# Patient Record
Sex: Female | Born: 1950
Health system: Southern US, Community
[De-identification: ages and names within clinical notes are randomized; demographics above are authoritative.]

## PROBLEM LIST (undated history)

## (undated) DIAGNOSIS — E1169 Type 2 diabetes mellitus with other specified complication: Secondary | ICD-10-CM

## (undated) DIAGNOSIS — G500 Trigeminal neuralgia: Secondary | ICD-10-CM

## (undated) DIAGNOSIS — M1711 Unilateral primary osteoarthritis, right knee: Secondary | ICD-10-CM

## (undated) DIAGNOSIS — K219 Gastro-esophageal reflux disease without esophagitis: Secondary | ICD-10-CM

## (undated) DIAGNOSIS — I10 Essential (primary) hypertension: Secondary | ICD-10-CM

## (undated) DIAGNOSIS — E119 Type 2 diabetes mellitus without complications: Secondary | ICD-10-CM

## (undated) DIAGNOSIS — C50911 Malignant neoplasm of unspecified site of right female breast: Secondary | ICD-10-CM

## (undated) DIAGNOSIS — Z808 Family history of malignant neoplasm of other organs or systems: Secondary | ICD-10-CM

## (undated) DIAGNOSIS — M1990 Unspecified osteoarthritis, unspecified site: Secondary | ICD-10-CM

## (undated) DIAGNOSIS — E785 Hyperlipidemia, unspecified: Secondary | ICD-10-CM

## (undated) DIAGNOSIS — Z8041 Family history of malignant neoplasm of ovary: Secondary | ICD-10-CM

## (undated) DIAGNOSIS — Z923 Personal history of irradiation: Secondary | ICD-10-CM

## (undated) DIAGNOSIS — E039 Hypothyroidism, unspecified: Secondary | ICD-10-CM

## (undated) HISTORY — DX: Type 2 diabetes mellitus with other specified complication: E11.69

## (undated) HISTORY — DX: Malignant neoplasm of unspecified site of right female breast: C50.911

## (undated) HISTORY — DX: Family history of malignant neoplasm of other organs or systems: Z80.8

## (undated) HISTORY — DX: Family history of malignant neoplasm of ovary: Z80.41

## (undated) HISTORY — DX: Trigeminal neuralgia: G50.0

## (undated) HISTORY — PX: TUBAL LIGATION: SHX77

## (undated) HISTORY — DX: Type 2 diabetes mellitus without complications: E11.9

## (undated) HISTORY — DX: Unspecified osteoarthritis, unspecified site: M19.90

## (undated) HISTORY — DX: Hyperlipidemia, unspecified: E78.5

## (undated) HISTORY — PX: OTHER SURGICAL HISTORY: SHX169

## (undated) HISTORY — PX: COLONOSCOPY: SHX174

---
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
## 2005-02-01 ENCOUNTER — Other Ambulatory Visit: Admission: RE | Admit: 2005-02-01 | Discharge: 2005-02-01 | Payer: Self-pay | Admitting: Obstetrics and Gynecology

## 2005-02-09 ENCOUNTER — Ambulatory Visit (HOSPITAL_COMMUNITY): Admission: RE | Admit: 2005-02-09 | Discharge: 2005-02-09 | Payer: Self-pay | Admitting: Obstetrics and Gynecology

## 2005-03-02 ENCOUNTER — Encounter: Admission: RE | Admit: 2005-03-02 | Discharge: 2005-03-02 | Payer: Self-pay | Admitting: Internal Medicine

## 2007-08-18 ENCOUNTER — Encounter (INDEPENDENT_AMBULATORY_CARE_PROVIDER_SITE_OTHER): Payer: Self-pay | Admitting: Obstetrics and Gynecology

## 2007-08-18 ENCOUNTER — Ambulatory Visit (HOSPITAL_COMMUNITY): Admission: RE | Admit: 2007-08-18 | Discharge: 2007-08-18 | Payer: Self-pay | Admitting: Obstetrics and Gynecology

## 2008-05-28 ENCOUNTER — Ambulatory Visit (HOSPITAL_COMMUNITY): Admission: RE | Admit: 2008-05-28 | Discharge: 2008-05-28 | Payer: Self-pay | Admitting: Internal Medicine

## 2010-06-12 ENCOUNTER — Ambulatory Visit (HOSPITAL_COMMUNITY): Admission: RE | Admit: 2010-06-12 | Payer: Self-pay | Admitting: Internal Medicine

## 2010-07-19 ENCOUNTER — Encounter: Payer: Self-pay | Admitting: Internal Medicine

## 2010-11-10 NOTE — Op Note (Signed)
Christina Hernandez, Christina Hernandez NO.:  [id number]   MEDICAL RECORD NO.:  [id number]          PATIENT TYPE:  AMB   LOCATION:  [facility]                           FACILITY:  [facility]   PHYSICIAN:  Janine Limbo, M.D.DATE OF BIRTH:  [birth year]   DATE OF PROCEDURE:  08/18/2007  DATE OF DISCHARGE:                               OPERATIVE REPORT   PREOPERATIVE DIAGNOSIS:  1. Postmenopausal bleeding.  2. Fibroid uterus.  3. Endometrial polyps.   POSTOPERATIVE DIAGNOSIS:  1. Postmenopausal bleeding.  2. Fibroid uterus.  3. Endometrial polyps.  4. Right vaginal sidewall lesion.   PROCEDURE:  1. Hysteroscopy.  2. Hysteroscopic resection of polyps.  3. Dilatation and curettage.  4. Vaginal biopsy.   SURGEON:  Janine Limbo, M.D.   FIRST ASSISTANT:  None.   ANESTHESIA:  General.   DISPOSITION:  Christina Hernandez is a 60 year old female who presents with the  above mentioned diagnosis.  She understands the indications for her  surgical procedure and she accepts the risks of, but not limited to,  anesthetic complications, bleeding, infection, and possible damage to  surrounding organs.   FINDINGS:  The uterus was 10-12 weeks' size and irregular.  No adnexal  masses were appreciated.  On hysteroscopy, the patient was found to have  at least three endometrial polyps with the largest polyp measuring  approximately 3 cm in size.  On the right vaginal sidewall, there was a  0.5 cm polypoid lesion (consistent with a condylomata).  Several similar  lesions were present just inside the introitus of the vagina.  The area  was completely removed by vaginal biopsy.   OPERATIVE PROCEDURE:  The patient taken to the operating room where a  general anesthetic was given.  The patient's abdomen, perineum, and  vagina were prepped with multiple layers of Betadine.  The bladder was  drained of urine.  Examination under anesthesia was performed.  The  patient was sterilely draped.  A paracervical  block was placed using 10  mL of 0.5% Marcaine with epinephrine.  5 mL of 0.5% Marcaine with  epinephrine were injected directly into the lesion on the right vaginal  sidewall.  An endocervical curettage was performed.  The uterus was  sounded to 9 cm.  The cervix was gently dilated.  The operative  hysteroscope was inserted and the cavity was carefully inspected.  Pictures were taken.  We used a double loop instrument to resect the  polyps from the endometrial cavity.  There did appear to be a 1-2 cm  submucosal fibroid present and this was resected using the double loop  apparatus.  The cavity was then curetted using a medium sharp curette  until the cavity was felt to be clean.  Repeat hysteroscopy was  performed and no major lesions were left inside the endometrial cavity.  We then used a biopsy instrument to biopsy the lesion on the right  vaginal sidewall.  Hemostasis was achieved using silver nitrate.  A  repeat examination under anesthesia was performed and the uterus was  noted to be firm.  It was still somewhat irregular because fibroids did  remain.  The patient tolerated her procedure well.  The estimated blood  loss for the procedure was 5 mL.  The estimated fluid deficit was 750 mL  but there was noted to be 500 mL of hysteroscopic fluid on the operating  room floor.  The patient tolerated her procedure well.  She was awakened  from her anesthetic without difficulty and taken to the recovery room in  stable condition.  Sponge, needle, and instrument counts were correct on  two occasions.   FOLLOW UP INSTRUCTIONS:  The patient was given a prescription for Motrin  and she will take 800 mg every 8 hours as needed for mild to moderate  pain.  She will take Darvocet-N100 1 tablet every 4-6 hours as needed  for severe pain.  She will return to see Dr. Stefano Gaul in 2-3 weeks for  follow up examination.  She was given a copy of the postoperative  instruction sheet as prepared by the  [facility] for  patients who have undergone a dilatation and curettage.  The patient  will return to work on Tuesday, August 22, 2007.   DISPOSITION OF THE SPECIMENS:  The endocervical curettage, the  endometrial resections, the endometrial curettings, and the biopsy of  the right vaginal sidewall were sent to pathology for evaluation.      Janine Limbo, M.D.  Electronically Signed     AVS/MEDQ  D:  08/18/2007  T:  08/19/2007  Job:  [id number]

## 2010-11-10 NOTE — H&P (Signed)
Christina Hernandez, Christina Hernandez NO.:  [id number]   MEDICAL RECORD NO.:  [id number]          PATIENT TYPE:  AMB   LOCATION:  [facility]C                           [facility]ILITY:  [facility]   PHYSICIAN:  Janine Limbo, M.D.DATE OF BIRTH:  11-03-1950   DATE OF ADMISSION:  08/18/2007  DATE OF DISCHARGE:                              HISTORY & PHYSICAL   HISTORY OF PRESENT ILLNESS:  Ms. Haring is a 60 year old female, para 4-  0-0-4, who presents for hysteroscopy with resection of a fibroid.  She  will also have a dilatation and curettage.  The patient has been  followed at the [facility]. The patient complains of postmenopausal bleeding. She also  has a history of fibroids.  The patient had a hydrosonogram performed  which showed a 9.83 cm x 6.70 cm uterus.  The endometrium was noted to  be 1.24 cm thick. The ovaries appeared normal.  The patient was found to  have two masses within the uterus measuring 1.59 cm and 2.03 cm.  It is  uncertain whether these are fibroids or whether these are endometrial  polyps.  The patient had a postpartum tubal ligation in 1983.  An  endometrial biopsy was performed and it showed a benign endometrial  polyp and also benign endometrial cells.   OBSTETRICAL HISTORY:  The patient has had four vaginal deliveries at  term.   PAST MEDICAL HISTORY:  The patient has hypothyroidism and she currently  takes Synthroid.  She also has an elevated cholesterol value and she  takes Crestor.  She takes vitamin D as well.   DRUG ALLERGIES:  No known drug allergies.   SOCIAL HISTORY:  The patient denies cigarette use, alcohol use, and  recreational drug use.   REVIEW OF SYSTEMS:  The patient has headaches occasionally.   FAMILY HISTORY:  The patient has a family history of heart disease,  asthma, high blood pressure, and strokes.   PHYSICAL EXAM:  Weight is 170 pounds.  HEENT: Within normal limits.  CHEST:   Clear.  HEART:  Regular rate and rhythm.  BREASTS:  Without masses.  ABDOMEN:  Nontender.  EXTREMITIES:  Grossly normal and her neurologic exam is grossly normal.  PELVIC:  External genitalia is normal. The vagina is normal except for  pelvic relaxation.  Cervix is nontender. The uterus is 8-10 weeks size  and slightly irregular. Adnexa no masses and rectovaginal exam confirms.   ASSESSMENT:  1. Postmenopausal bleeding.  2. Fibroid uterus.  3. Endometrial mass is consistent with polyps or fibroids.  4. Hypothyroidism.  5. Hypercholesterolemia.Marland Kitchen   PLAN:  The  patient will undergo hysteroscopy with resection of  endometrial masses.  She will also have a dilatation and curettage. The  patient understands the indications for her surgical procedure and she  accepts the risks of, but not limited to, anesthetic complications,  bleeding, infection, and possible damage to the surrounding organs.      Janine Limbo, M.D.  Electronically Signed     AVS/MEDQ  D:  08/02/2007  T:  08/03/2007  Job:  [id number]

## 2010-11-16 ENCOUNTER — Other Ambulatory Visit (HOSPITAL_COMMUNITY): Payer: Self-pay | Admitting: Internal Medicine

## 2010-11-16 ENCOUNTER — Other Ambulatory Visit: Payer: Self-pay | Admitting: Internal Medicine

## 2010-11-16 DIAGNOSIS — R102 Pelvic and perineal pain: Secondary | ICD-10-CM

## 2010-11-16 DIAGNOSIS — R140 Abdominal distension (gaseous): Secondary | ICD-10-CM

## 2010-11-16 DIAGNOSIS — Z1231 Encounter for screening mammogram for malignant neoplasm of breast: Secondary | ICD-10-CM

## 2010-11-25 ENCOUNTER — Ambulatory Visit (HOSPITAL_COMMUNITY)
Admission: RE | Admit: 2010-11-25 | Discharge: 2010-11-25 | Disposition: A | Discharge status: Home / Self Care | Payer: BC Managed Care – PPO | Source: Ambulatory Visit | Attending: Internal Medicine | Admitting: Internal Medicine

## 2010-11-25 DIAGNOSIS — Z1231 Encounter for screening mammogram for malignant neoplasm of breast: Secondary | ICD-10-CM | POA: Insufficient documentation

## 2010-11-26 ENCOUNTER — Ambulatory Visit
Admission: RE | Admit: 2010-11-26 | Discharge: 2010-11-26 | Disposition: A | Discharge status: Home / Self Care | Payer: BC Managed Care – PPO | Source: Ambulatory Visit | Attending: Internal Medicine | Admitting: Internal Medicine

## 2010-11-26 DIAGNOSIS — R102 Pelvic and perineal pain: Secondary | ICD-10-CM

## 2010-11-26 DIAGNOSIS — R140 Abdominal distension (gaseous): Secondary | ICD-10-CM

## 2011-03-19 LAB — URINALYSIS, ROUTINE W REFLEX MICROSCOPIC
Bilirubin Urine: NEGATIVE
Glucose, UA: NEGATIVE
Hgb urine dipstick: NEGATIVE
Ketones, ur: NEGATIVE
Nitrite: NEGATIVE
Protein, ur: NEGATIVE
Specific Gravity, Urine: 1.01
Urobilinogen, UA: 0.2
pH: 7

## 2011-03-19 LAB — BASIC METABOLIC PANEL
BUN: 8
CO2: 31
Calcium: 9.4
Chloride: 104
Creatinine, Ser: 0.67
GFR calc Af Amer: >60
GFR calc non Af Amer: >60
Glucose, Bld: 104 — ABNORMAL HIGH
Potassium: 3.4 — ABNORMAL LOW
Sodium: 141

## 2011-03-19 LAB — CBC
HCT: 38.4
Hemoglobin: 13.3
MCHC: 34.5
MCV: 83.2
Platelets: 264
RBC: 4.61
RDW: 13
WBC: 6

## 2011-03-22 ENCOUNTER — Other Ambulatory Visit (HOSPITAL_COMMUNITY)
Admission: RE | Admit: 2011-03-22 | Discharge: 2011-03-22 | Disposition: A | Discharge status: Home / Self Care | Payer: BC Managed Care – PPO | Source: Ambulatory Visit | Attending: Obstetrics and Gynecology | Admitting: Obstetrics and Gynecology

## 2011-03-22 ENCOUNTER — Other Ambulatory Visit: Payer: Self-pay | Admitting: Obstetrics and Gynecology

## 2011-03-22 DIAGNOSIS — Z01419 Encounter for gynecological examination (general) (routine) without abnormal findings: Secondary | ICD-10-CM | POA: Insufficient documentation

## 2011-04-02 ENCOUNTER — Other Ambulatory Visit: Payer: Self-pay | Admitting: Obstetrics and Gynecology

## 2011-05-17 ENCOUNTER — Encounter (HOSPITAL_COMMUNITY): Payer: Self-pay | Admitting: Pharmacy Technician

## 2011-05-18 ENCOUNTER — Encounter (HOSPITAL_COMMUNITY): Payer: Self-pay

## 2011-05-18 ENCOUNTER — Encounter (HOSPITAL_COMMUNITY)
Admission: RE | Admit: 2011-05-18 | Discharge: 2011-05-18 | Disposition: A | Discharge status: Home / Self Care | Payer: BC Managed Care – PPO | Source: Ambulatory Visit | Attending: Obstetrics and Gynecology | Admitting: Obstetrics and Gynecology

## 2011-05-18 HISTORY — DX: Essential (primary) hypertension: I10

## 2011-05-18 HISTORY — DX: Gastro-esophageal reflux disease without esophagitis: K21.9

## 2011-05-18 HISTORY — DX: Hypothyroidism, unspecified: E03.9

## 2011-05-18 LAB — BASIC METABOLIC PANEL
BUN: 14 mg/dL (ref 6–23)
CO2: 26 mEq/L (ref 19–32)
Calcium: 9.7 mg/dL (ref 8.4–10.5)
Chloride: 98 mEq/L (ref 96–112)
Creatinine, Ser: 0.65 mg/dL (ref 0.50–1.10)
GFR calc Af Amer: >90 mL/min (ref >90)
GFR calc non Af Amer: >90 mL/min (ref >90)
Glucose, Bld: 102 mg/dL — ABNORMAL HIGH (ref 70–99)
Potassium: 3.2 mEq/L — ABNORMAL LOW (ref 3.5–5.1)
Sodium: 136 mEq/L (ref 135–145)

## 2011-05-18 LAB — CBC
HCT: 38.8 % (ref 36.0–46.0)
Hemoglobin: 12.6 g/dL (ref 12.0–15.0)
MCH: 26.8 pg (ref 26.0–34.0)
MCHC: 32.5 g/dL (ref 30.0–36.0)
MCV: 82.4 fL (ref 78.0–100.0)
Platelets: 267 10*3/uL (ref 150–400)
RBC: 4.71 MIL/uL (ref 3.87–5.11)
RDW: 14.2 % (ref 11.5–15.5)
WBC: 5.4 10*3/uL (ref 4.0–10.5)

## 2011-05-18 NOTE — Patient Instructions (Signed)
YOUR PROCEDURE IS SCHEDULED ON:05/24/11  ENTER THROUGH THE MAIN ENTRANCE OF [facility] AT:6am  USE DESK PHONE AND DIAL [phone] TO INFORM us OF YOUR ARRIVAL  CALL [phone] IF YOU HAVE ANY QUESTIONS OR PROBLEMS PRIOR TO YOUR ARRIVAL.  REMEMBER: DO NOT EAT OR DRINK AFTER MIDNIGHT :Sunday  SPECIAL INSTRUCTIONS:   YOU MAY BRUSH YOUR TEETH THE MORNING OF SURGERY   TAKE THESE MEDICINES THE DAY OF SURGERY WITH SIP OF WATER:Thyroid and BP pills   DO NOT WEAR JEWELRY, EYE MAKEUP, LIPSTICK OR DARK FINGERNAIL POLISH DO NOT WEAR LOTIONS OR DEODORANT DO NOT SHAVE FOR 48 HOURS PRIOR TO SURGERY  YOU WILL NOT BE ALLOWED TO DRIVE YOURSELF HOME.  NAME OF DRIVER: Roe Coombs - spouse- [phone]

## 2011-05-18 NOTE — Pre-Procedure Instructions (Signed)
Patient's K+ is low but not low enough to alert [facility]. I called pt at home and advised her to eat bananas daily until surgery so K+ wouldn't drop too low. Patient states she does eat bananas but usually not everyday, and will follow my advise to eat more bananas,etc.

## 2011-05-18 NOTE — Pre-Procedure Instructions (Signed)
Pt is scheduled for stress test on 05/19/11 as a work up due to family history of cardiac problems.

## 2011-05-23 ENCOUNTER — Other Ambulatory Visit: Payer: Self-pay | Admitting: Obstetrics and Gynecology

## 2011-05-23 NOTE — H&P (Signed)
  History of Present Illness  General:  60 y/o presents for pre-op for Hysteroscopy/D&C due to thickened endometrial stripe. Pt went for preop clearance with her PCP and had c/o SOB on ROS. Cardiac work up completed and normal. Pt was initially referred by PCP to evaluate a large fibroid uterus.  Pt with complaint of bloating.  Denied abnormal bleeding.  Some pelvic pain but nothing severe.  Thickened EMS noted on ultrasound.  EMB did not show endometrial tissue.   Current Medications  Meloxicam Tablet 1 tablet prn  Synthroid 75 MCG Tablet 1 tablet every morning on an empty stomach Once a day  Flexeril Tablet 1 tablet prn  Medication List reviewed and reconciled with the patient   Past Medical History  Hypothyroidism  Back spasm   Surgical History  polyps removed x 2    Family History  Father: deceased hypertension, kidney failure   Mother: deceased hypertension   Sister 1: deceased cancer, ovarian    Social History  General:  History of smoking  cigarettes: Never smoked no Smoking.  no Alcohol.  no Recreational drug use.  Exercise: nothing structured.  Occupation: Retired Runner, broadcasting/film/video.  Children: Boys, 3, girls, 1.    Gyn History  Periods : postmenopausal.  Denies H/O LMP .  Birth control BTL.  Last pap smear date 03/22/11.  Last mammogram date 11/25/10.  Denies H/O Abnormal pap smear .  Denies H/O STD .  Menarche 10.  GYN procedures Endometrial Biopsy 04/02/11.    OB History  Number of pregnancies 4.  Pregnancy # 1 live birth, vaginal delivery.  Pregnancy # 2 live birth, vaginal delivery.  Pregnancy # 3 live birth, vaginal delivery.  Pregnancy # 4: live birth, vaginal delivery.    Allergies  N.K.D.A.   Hospitalization/Major Diagnostic Procedure  chlidbirth x 4    Review of Systems  See HPI.   Vital Signs  Wt 179, Wt change 1 lb, Ht 65, BMI 29.78, Pulse sitting 107, BP sitting 118/74 179.   Physical Examination  GENERAL:  Patient appears alert and  oriented.  General Appearance: well-appearing, well-developed, no acute distress.  Speech: clear.  NECK:  Thyroid: no thyromegaly.  LUNGS:  General clear bilaterally, no crackles, no wheezes.  HEART:  Heart sounds: normal, RRR, no murmur.  ABDOMEN:  General: no masses tenderness or organomegaly, keloid at umbilicus.  FEMALE GENITOURINARY:  Cervix visualized, healthy appearing, no discharge, no lesions.  Adnexa: no mass, non tender.  Uterus: normal size/shape/consistency, freely mobile, non tender, RETROVERTED.  Vagina: pink/moist mucosa, no lesions, no abnormal discharge.  Vulva: normal, no lesions, no skin discoloration.  Anus: no external hemosrhoids.  EXTREMITIES:  general no edema.     Assessments   1. Pre-op exam - V72.84 (Primary)   2. Thickened endometrium - 793.5   Treatment  1. Pre-op exam  Discussed R/B/A of surgery and indication. All questions answered.    Follow Up  2 Weeks (Reason: Post op)

## 2011-05-24 ENCOUNTER — Encounter (HOSPITAL_COMMUNITY): Payer: Self-pay | Admitting: Anesthesiology

## 2011-05-24 ENCOUNTER — Encounter (HOSPITAL_COMMUNITY)
Admission: RE | Disposition: A | Discharge status: Home / Self Care | Payer: Self-pay | Source: Ambulatory Visit | Attending: Obstetrics and Gynecology

## 2011-05-24 ENCOUNTER — Ambulatory Visit (HOSPITAL_COMMUNITY)
Admission: RE | Admit: 2011-05-24 | Discharge: 2011-05-24 | Disposition: A | Discharge status: Home / Self Care | Payer: BC Managed Care – PPO | Source: Ambulatory Visit | Attending: Obstetrics and Gynecology | Admitting: Obstetrics and Gynecology

## 2011-05-24 ENCOUNTER — Ambulatory Visit (HOSPITAL_COMMUNITY): Payer: BC Managed Care – PPO | Admitting: Anesthesiology

## 2011-05-24 ENCOUNTER — Other Ambulatory Visit: Payer: Self-pay | Admitting: Obstetrics and Gynecology

## 2011-05-24 DIAGNOSIS — R9389 Abnormal findings on diagnostic imaging of other specified body structures: Secondary | ICD-10-CM | POA: Insufficient documentation

## 2011-05-24 HISTORY — PX: HYSTEROSCOPY WITH D & C: SHX1775

## 2011-05-24 LAB — TYPE AND SCREEN
ABO/RH(D): A POS
Antibody Screen: NEGATIVE

## 2011-05-24 LAB — ABO/RH: ABO/RH(D): A POS

## 2011-05-24 SURGERY — DILATATION AND CURETTAGE /HYSTEROSCOPY
Anesthesia: Monitor Anesthesia Care | Site: Vagina | Wound class: Clean Contaminated

## 2011-05-24 MED ORDER — KETOROLAC TROMETHAMINE 30 MG/ML IJ SOLN
INTRAMUSCULAR | Status: DC | PRN
  Administered 2011-05-24: 30 mg via INTRAVENOUS

## 2011-05-24 MED ORDER — DEXAMETHASONE SODIUM PHOSPHATE 10 MG/ML IJ SOLN
INTRAMUSCULAR | Status: AC
  Filled 2011-05-24: qty 1

## 2011-05-24 MED ORDER — PROPOFOL 10 MG/ML IV EMUL
INTRAVENOUS | Status: AC
  Filled 2011-05-24: qty 20

## 2011-05-24 MED ORDER — FENTANYL CITRATE 0.05 MG/ML IJ SOLN
INTRAMUSCULAR | Status: DC | PRN
  Administered 2011-05-24: 25 ug via INTRAVENOUS

## 2011-05-24 MED ORDER — LACTATED RINGERS IV SOLN
INTRAVENOUS | Status: DC
  Administered 2011-05-24 (×2): via INTRAVENOUS

## 2011-05-24 MED ORDER — ONDANSETRON HCL 4 MG/2ML IJ SOLN
INTRAMUSCULAR | Status: DC | PRN
  Administered 2011-05-24: 4 mg via INTRAVENOUS

## 2011-05-24 MED ORDER — PROPOFOL 10 MG/ML IV EMUL
INTRAVENOUS | Status: DC | PRN
  Administered 2011-05-24: 20 mg via INTRAVENOUS
  Administered 2011-05-24 (×3): 10 mg via INTRAVENOUS
  Administered 2011-05-24: 20 mg via INTRAVENOUS
  Administered 2011-05-24: 10 mg via INTRAVENOUS
  Administered 2011-05-24: 50 mg via INTRAVENOUS
  Administered 2011-05-24: 20 mg via INTRAVENOUS

## 2011-05-24 MED ORDER — FENTANYL CITRATE 0.05 MG/ML IJ SOLN: 25.0000 ug | INTRAMUSCULAR | Status: DC | PRN

## 2011-05-24 MED ORDER — CEFOTETAN DISODIUM 1 G IJ SOLR
1.0000 g | INTRAMUSCULAR | Status: AC
  Administered 2011-05-24: 1 g via INTRAVENOUS
  Filled 2011-05-24: qty 1

## 2011-05-24 MED ORDER — LIDOCAINE HCL (CARDIAC) 20 MG/ML IV SOLN
INTRAVENOUS | Status: AC
  Filled 2011-05-24: qty 5

## 2011-05-24 MED ORDER — MIDAZOLAM HCL 5 MG/5ML IJ SOLN
INTRAMUSCULAR | Status: DC | PRN
  Administered 2011-05-24: 2 mg via INTRAVENOUS

## 2011-05-24 MED ORDER — LIDOCAINE HCL 2 % IJ SOLN
INTRAMUSCULAR | Status: DC | PRN
  Administered 2011-05-24: 5 mL

## 2011-05-24 MED ORDER — DEXAMETHASONE SODIUM PHOSPHATE 10 MG/ML IJ SOLN
INTRAMUSCULAR | Status: DC | PRN
  Administered 2011-05-24: 10 mg via INTRAVENOUS

## 2011-05-24 MED ORDER — GLYCINE 1.5 % IR SOLN
Status: DC | PRN
  Administered 2011-05-24: 3000 mL

## 2011-05-24 MED ORDER — MIDAZOLAM HCL 2 MG/2ML IJ SOLN
INTRAMUSCULAR | Status: AC
  Filled 2011-05-24: qty 2

## 2011-05-24 MED ORDER — KETOROLAC TROMETHAMINE 30 MG/ML IJ SOLN
INTRAMUSCULAR | Status: AC
  Filled 2011-05-24: qty 1

## 2011-05-24 MED ORDER — FENTANYL CITRATE 0.05 MG/ML IJ SOLN
INTRAMUSCULAR | Status: AC
  Filled 2011-05-24: qty 2

## 2011-05-24 MED ORDER — ONDANSETRON HCL 4 MG/2ML IJ SOLN
INTRAMUSCULAR | Status: AC
  Filled 2011-05-24: qty 2

## 2011-05-24 MED ORDER — IBUPROFEN 600 MG PO TABS
ORAL_TABLET | ORAL | Status: DC
Start: 2011-05-24 — End: 2014-01-01

## 2011-05-24 MED ORDER — LIDOCAINE HCL (CARDIAC) 20 MG/ML IV SOLN
INTRAVENOUS | Status: DC | PRN
  Administered 2011-05-24: 10 mg via INTRAVENOUS

## 2011-05-24 SURGICAL SUPPLY — 21 items
CANISTER SUCTION 2500CC (MISCELLANEOUS) ×3 IMPLANT
CATH ROBINSON RED A/P 16FR (CATHETERS) ×6 IMPLANT
CLOTH BEACON ORANGE TIMEOUT ST (SAFETY) ×6 IMPLANT
CONTAINER PREFILL 10% NBF 60ML (FORM) ×6 IMPLANT
DECANTER SPIKE VIAL GLASS SM (MISCELLANEOUS) ×3 IMPLANT
DILATOR CANAL MILEX (MISCELLANEOUS) IMPLANT
DRAPE UTILITY XL STRL (DRAPES) ×3 IMPLANT
GLOVE BIO SURGEON STRL SZ 6.5 (GLOVE) ×6 IMPLANT
GLOVE BIO SURGEON STRL SZ7 (GLOVE) ×6 IMPLANT
GLOVE BIOGEL PI IND STRL 7.0 (GLOVE) ×4 IMPLANT
GLOVE BIOGEL PI INDICATOR 7.0 (GLOVE) ×2
GOWN PREVENTION PLUS LG XLONG (DISPOSABLE) ×12 IMPLANT
NDL SPNL 22GX3.5 QUINCKE BK (NEEDLE) ×1 IMPLANT
NEEDLE SPNL 22GX3.5 QUINCKE BK (NEEDLE) ×3 IMPLANT
NS IRRIG 1000ML POUR BTL (IV SOLUTION) ×3 IMPLANT
PACK HYSTEROSCOPY LF (CUSTOM PROCEDURE TRAY) ×3 IMPLANT
PACK VAGINAL MINOR WOMEN LF (CUSTOM PROCEDURE TRAY) ×3 IMPLANT
PAD PREP 24X48 CUFFED NSTRL (MISCELLANEOUS) ×3 IMPLANT
SYR CONTROL 10ML LL (SYRINGE) ×3 IMPLANT
TOWEL OR 17X24 6PK STRL BLUE (TOWEL DISPOSABLE) ×12 IMPLANT
WATER STERILE IRR 1000ML POUR (IV SOLUTION) ×3 IMPLANT

## 2011-05-24 NOTE — Transfer of Care (Signed)
Immediate Anesthesia Transfer of Care Note  Patient: Christina Hernandez  Procedure(s) Performed:  DILATATION AND CURETTAGE (D&C) /HYSTEROSCOPY  Patient Location: PACU  Anesthesia Type: MAC  Level of Consciousness: awake,alert, cooperative.  Airway & Oxygen Therapy: Patient Spontanous Breathing and Patient connected to nasal cannula oxygen  Post-op Assessment: Report given to PACU RN and Post -op Vital signs reviewed and stable  Post vital signs: Reviewed  Complications: No apparent anesthesia complications

## 2011-05-24 NOTE — H&P (Signed)
Update h&p. Pt without complaints.  Pt examined and in good health.

## 2011-05-24 NOTE — Anesthesia Preprocedure Evaluation (Addendum)
Anesthesia Evaluation  Patient identified by MRN, date of birth, ID band Patient awake    Reviewed: Allergy & Precautions, H&P , Patient's Chart, lab work & pertinent test results, reviewed documented beta blocker date and time   Airway Mallampati: II TM Distance: >3 FB Neck ROM: full    Dental No notable dental hx.    Pulmonary  clear to auscultation  Pulmonary exam normal       Cardiovascular hypertension (well controlled), On Medications regular Normal    Neuro/Psych    GI/Hepatic GERD- (Rare sx)  Controlled,  Endo/Other    Renal/GU      Musculoskeletal   Abdominal   Peds  Hematology   Anesthesia Other Findings   Reproductive/Obstetrics                          Anesthesia Physical Anesthesia Plan  ASA: II  Anesthesia Plan: MAC and General   Post-op Pain Management:    Induction: Intravenous  Airway Management Planned: Mask  Additional Equipment:   Intra-op Plan:   Post-operative Plan:   Informed Consent: I have reviewed the patients History and Physical, chart, labs and discussed the procedure including the risks, benefits and alternatives for the proposed anesthesia with the patient or authorized representative who has indicated his/her understanding and acceptance.   Dental Advisory Given  Plan Discussed with: CRNA and Surgeon  Anesthesia Plan Comments: (  Discussed  general anesthesia, including possible nausea, instrumentation of airway, sore throat,pulmonary aspiration, etc. I asked if the were any outstanding questions, or  concerns before we proceeded. )        Anesthesia Quick Evaluation

## 2011-05-24 NOTE — Op Note (Signed)
NAMEJAYSHA, Christina Hernandez NO.:  [id number]  MEDICAL RECORD NO.:  [id number]  LOCATION:  [facility]                          FACILITY[facility]  [facility]  PHYSICIAN:  Pieter Partridge, MD   DATE OF BIRTH:  1951/03/09  DATE OF PROCEDURE:  05/24/2011 DATE OF DISCHARGE:                              OPERATIVE REPORT   PREOPERATIVE DIAGNOSIS:  Thickened endometrium.  POSTOPERATIVE DIAGNOSIS:  Thickened endometrium.  PROCEDURE:  Hysteroscopy, dilation and curettage.  SURGEON:  Pieter Partridge, MD  ASSISTANT:  None.  ANESTHESIA:  Local 2% lidocaine, 8 mL and IV sedation.  ESTIMATED BLOOD LOSS:  Minimal.  URINE OUTPUT:  In and out cath prior to procedure was 50 mL.  BLOOD ADMINISTERED:  None.  DRAINS:  None.  SPECIMEN:  Endometrial curettings to pathology.  DISPOSITION:  To PACU, hemodynamically stable.  FINDINGS:  Stenotic internal os.  Narrow cavity with thickened normal endometrial tissue.  No discrete masses visualized.  Large fibroid uterus.  Uterus down to 6-7 cm.  INDICATIONS:  Ms. Minella is a 60 year old female who was referred to me for an enlarged fibroid uterus by her primary care doctor.  The patient had been complaining of bloating and some pelvic pain.  On ultrasound, the endometrial stripe appeared to be thickened.  In the office, endometrial biopsy was performed, however, only the endocervix was sampled due to stenosis.  The patient was counseled on the need for further evaluation of the thickened endometrium and consented for hysteroscopy, D and C.  PROCEDURE IN DETAIL:  The patient was taken to the operating room.  She underwent MAC sedation without complication.  She was placed in the dorsal lithotomy position, and prepped and draped in a normal sterile fashion.  A Graves speculum was advanced into the uterus.  The anterior lip of the cervix was grasped with a single-tooth tenaculum after about 2 mL of 2% lidocaine were injected at the anterior cervical  lip.  The paracervical block was performed.  The cervical os was then entered and the os was somewhat stenotic and with dilation that was opened.  Once the cervix was dilated, I advanced the hysteroscope and noted a narrow cavity that actually kind of displaced to the right.  Initially, I thought it might have been a false passage, but it appeared that there was normal endometrial tissue and the uterine fundus was appreciated.  I did not see any discrete polyps, but I did see a large piece of endometrium at the midline.  The hysteroscope was removed.  A sharp curettage was done.  Again, the cervical opening deviated to the patient's left as did the endometrial cavity, but I felt strongly that was inside the endometrial cavity.  A sharp curettage was performed.  The endometrial tissue did not appear copious but I did looked back in with a hysteroscope and could tell that I had gotten some of the endometrial tissue and the large piece of endometrium had also been removed.  All instruments were removed from the vagina  after I looked with the hysteroscope one more time just to make sure there was no perforation. There was no bleeding at the site of the tenaculum site or from  the endocervix.  The patient tolerated the procedure well.  All sponge and instrument counts were correct x3.  The patient got antibiotics prior to surgery and she had SCDs on in place.     Pieter Partridge, MD     EBV/MEDQ  D:  05/24/2011  T:  05/24/2011  Job:  [id number]

## 2011-05-24 NOTE — Preoperative (Signed)
Beta Blockers   Reason not to administer Beta Blockers:Not Applicable 

## 2011-05-24 NOTE — Anesthesia Postprocedure Evaluation (Signed)
Anesthesia Post Note  Patient: Christina Hernandez  Procedure(s) Performed:  DILATATION AND CURETTAGE (D&C) /HYSTEROSCOPY  Anesthesia type: MAC  Patient location: PACU  Post pain: Pain level controlled  Post assessment: Post-op Vital signs reviewed  Last Vitals:  Filed Vitals:   05/24/11 0845  BP: 121/72  Pulse: 79  Temp: 36.3 C  Resp: 16    Post vital signs: Reviewed  Level of consciousness: sedated  Complications: No apparent anesthesia complications

## 2011-05-24 NOTE — Brief Op Note (Signed)
05/24/2011  8:15 AM  PATIENT:  Christina Hernandez  60 y.o. female  PRE-OPERATIVE DIAGNOSIS:  thickened endometrium  POST-OPERATIVE DIAGNOSIS:  thickeend endometrium  PROCEDURE:  Procedure(s): DILATATION AND CURETTAGE (D&C) /HYSTEROSCOPY  SURGEON:  Surgeon(s): Geryl Rankins, MD  PHYSICIAN ASSISTANT: None  ASSISTANTS: None   ANESTHESIA:   local and IV sedation  EBL:  Total I/O In: 1000 [I.V.:1000] Out: -   BLOOD ADMINISTERED:none  DRAINS: none   LOCAL MEDICATIONS USED:  2% LIDOCAINE 8 CC  SPECIMEN:  Source of Specimen:  Uterus-endometrial currettings  DISPOSITION OF SPECIMEN:  PATHOLOGY  COUNTS:  YES  TOURNIQUET:  * No tourniquets in log *  DICTATION: .Other Dictation: Dictation Number [id number]  PLAN OF CARE: Discharge to home after PACU  PATIENT DISPOSITION:  PACU - hemodynamically stable.   Delay start of Pharmacological VTE agent (>24hrs) due to surgical blood loss or risk of bleeding:  yes

## 2011-05-25 ENCOUNTER — Encounter (HOSPITAL_COMMUNITY): Payer: Self-pay | Admitting: Obstetrics and Gynecology

## 2011-12-21 ENCOUNTER — Other Ambulatory Visit (HOSPITAL_COMMUNITY): Payer: Self-pay | Admitting: Family Medicine

## 2011-12-21 DIAGNOSIS — Z1231 Encounter for screening mammogram for malignant neoplasm of breast: Secondary | ICD-10-CM

## 2012-01-14 ENCOUNTER — Ambulatory Visit (HOSPITAL_COMMUNITY)
Admission: RE | Admit: 2012-01-14 | Discharge: 2012-01-14 | Disposition: A | Discharge status: Home / Self Care | Payer: BC Managed Care – PPO | Source: Ambulatory Visit | Attending: Family Medicine | Admitting: Family Medicine

## 2012-01-14 DIAGNOSIS — Z1231 Encounter for screening mammogram for malignant neoplasm of breast: Secondary | ICD-10-CM | POA: Insufficient documentation

## 2012-02-17 ENCOUNTER — Ambulatory Visit
Admission: RE | Admit: 2012-02-17 | Discharge: 2012-02-17 | Disposition: A | Discharge status: Home / Self Care | Payer: BC Managed Care – PPO | Source: Ambulatory Visit | Attending: Internal Medicine | Admitting: Internal Medicine

## 2012-02-17 ENCOUNTER — Other Ambulatory Visit: Payer: Self-pay | Admitting: Internal Medicine

## 2012-02-17 DIAGNOSIS — R05 Cough: Secondary | ICD-10-CM

## 2012-02-17 DIAGNOSIS — R059 Cough, unspecified: Secondary | ICD-10-CM

## 2012-02-17 DIAGNOSIS — R0602 Shortness of breath: Secondary | ICD-10-CM

## 2012-06-28 HISTORY — PX: WISDOM TOOTH EXTRACTION: SHX21

## 2013-01-29 ENCOUNTER — Other Ambulatory Visit (HOSPITAL_COMMUNITY): Payer: Self-pay | Admitting: Internal Medicine

## 2013-01-29 DIAGNOSIS — Z1231 Encounter for screening mammogram for malignant neoplasm of breast: Secondary | ICD-10-CM

## 2013-02-02 ENCOUNTER — Ambulatory Visit (HOSPITAL_COMMUNITY)
Admission: RE | Admit: 2013-02-02 | Discharge: 2013-02-02 | Disposition: A | Discharge status: Home / Self Care | Payer: BC Managed Care – PPO | Source: Ambulatory Visit | Attending: Internal Medicine | Admitting: Internal Medicine

## 2013-02-02 DIAGNOSIS — Z1231 Encounter for screening mammogram for malignant neoplasm of breast: Secondary | ICD-10-CM

## 2013-02-16 ENCOUNTER — Ambulatory Visit (INDEPENDENT_AMBULATORY_CARE_PROVIDER_SITE_OTHER): Payer: BC Managed Care – PPO | Admitting: Internal Medicine

## 2013-02-16 VITALS — BP 128/78 | HR 108 | Temp 98.9°F | Resp 18

## 2013-02-16 DIAGNOSIS — J209 Acute bronchitis, unspecified: Secondary | ICD-10-CM

## 2013-02-16 MED ORDER — AZITHROMYCIN 500 MG PO TABS: 500.0000 mg | ORAL_TABLET | Freq: Every day | ORAL | Status: DC

## 2013-02-16 MED ORDER — HYDROCODONE-ACETAMINOPHEN 7.5-325 MG/15ML PO SOLN: 5.0000 mL | Freq: Four times a day (QID) | ORAL | Status: DC | PRN

## 2013-02-16 NOTE — Progress Notes (Signed)
  Subjective:    Patient ID: Christina Hernandez, female    DOB: 04-Mar-1951, 62 y.o.   MRN: [id number]  HPI    Review of Systems     Objective:   Physical Exam        Assessment & Plan:

## 2013-02-16 NOTE — Progress Notes (Signed)
  Subjective:    Patient ID: Christina Hernandez, female    DOB: 18-Jul-1950, 62 y.o.   MRN: [id number]  HPI 62 year old female here for cough, congestion and sore throat.  Mild headache.  Started coughing one week ago.  Was on a cruise and when she returned this past Wednesday the other symptoms started in.  No fever or chills.  No difficulty swallowing or short of breath.  Not coughing up any phlem.  Minimal fever, no cp.   Review of Systems Dr. Lelon Perla her IM    Objective:   Physical Exam  Vitals reviewed. Constitutional: She is oriented to person, place, and time. She appears well-developed and well-nourished. No distress.  HENT:  Right Ear: External ear normal.  Left Ear: External ear normal.  Nose: Mucosal edema, rhinorrhea and sinus tenderness present. Right sinus exhibits no maxillary sinus tenderness and no frontal sinus tenderness. Left sinus exhibits maxillary sinus tenderness and frontal sinus tenderness.  Mouth/Throat: Oropharynx is clear and moist.  Eyes: EOM are normal. Pupils are equal, round, and reactive to light.  Neck: Normal range of motion. Neck supple.  Cardiovascular: Normal rate, regular rhythm and normal heart sounds.   Pulmonary/Chest: Effort normal. She has rhonchi.  Musculoskeletal: Normal range of motion.  Lymphadenopathy:    She has no cervical adenopathy.  Neurological: She is alert and oriented to person, place, and time. Coordination normal.  Skin: Rash noted.  Psychiatric: She has a normal mood and affect.          Assessment & Plan:  Bronchitis

## 2013-02-16 NOTE — Patient Instructions (Addendum)

## 2013-03-02 ENCOUNTER — Ambulatory Visit (INDEPENDENT_AMBULATORY_CARE_PROVIDER_SITE_OTHER): Payer: BC Managed Care – PPO | Admitting: Family Medicine

## 2013-03-02 VITALS — BP 120/72 | HR 94 | Temp 97.6°F | Resp 20 | Ht 66.0 in | Wt 175.8 lb

## 2013-03-02 DIAGNOSIS — M170 Bilateral primary osteoarthritis of knee: Secondary | ICD-10-CM

## 2013-03-02 DIAGNOSIS — M62830 Muscle spasm of back: Secondary | ICD-10-CM

## 2013-03-02 DIAGNOSIS — M5380 Other specified dorsopathies, site unspecified: Secondary | ICD-10-CM

## 2013-03-02 DIAGNOSIS — M1710 Unilateral primary osteoarthritis, unspecified knee: Secondary | ICD-10-CM

## 2013-03-02 MED ORDER — CYCLOBENZAPRINE HCL 10 MG PO TABS: 10.0000 mg | ORAL_TABLET | Freq: Two times a day (BID) | ORAL | Status: DC | PRN

## 2013-03-02 NOTE — Patient Instructions (Addendum)
Use the muscle relaxer (flexeril) as needed for your back.  Remember this can cause sedation especially if used with the cough syrup.   Move around frequently, and try heat for your back.  If you are not feeling better in the next few days please let me know Exercise and weight control will help protect your knees.  It is ok to use OTC pain relievers as needed

## 2013-03-02 NOTE — Progress Notes (Signed)
[facility] [address] [place] [address] [phone]  Date:  03/02/2013   Name:  Christina Hernandez   DOB:  16-Dec-1950   MRN:  [id number]  PCP:  [name], MD    Chief Complaint: Back Pain   History of Present Illness:  Christina Hernandez is a 62 y.o. very pleasant female patient who presents with the following:  Seen recently with bronchitis.   She is here today bilateral lower back pain or the last 2 days. She had a hard time getting out of bed yesterday and this am.  If she is still for awhile (like in the car) it is more difficult for her to move.   She has "thrown her back out" in the past.   No known injury.  She did fall 4 or 5 years ago,but nothing more recent.    No radiation down her legs, no numbness, weakness or tingling.  No bowel or bladder incontinence.   No urinary sx.   She feels a little bit nauseated.  She has not taken any medication for this so far.  No chest pain, no abdomianl pain.  Also notes that her knees have bohered her off an on for about a year.  She will occasionally take an OTC analgesic for her knees  There are no active problems to display for this patient.   Past Medical History  Diagnosis Date  . Hypertension   . Hypothyroidism   . GERD (gastroesophageal reflux disease)     rarely occurs- no meds  . Shortness of breath     pt states sob on exertion because she is "out of shape"    Past Surgical History  Procedure Laterality Date  . Uterine polyp    . Tubal ligation    . Hysteroscopy w/d&c  05/24/2011    Procedure: DILATATION AND CURETTAGE (D&C) /HYSTEROSCOPY;  Surgeon: Geryl Rankins, MD;  Location: [facility];  Service: Gynecology;  Laterality: N/A;    History  Substance Use Topics  . Smoking status: Never Smoker   . Smokeless tobacco: Not on file  . Alcohol Use: No    Family History  Problem Relation Age of Onset  . Hypertension Mother   . Heart disease Mother     No Known  Allergies  Medication list has been reviewed and updated.  Current Outpatient Prescriptions on File Prior to Visit  Medication Sig Dispense Refill  . azithromycin (ZITHROMAX) 500 MG tablet Take 1 tablet (500 mg total) by mouth daily.  5 tablet  0  . b complex vitamins tablet Take 1 tablet by mouth daily.      . Cholecalciferol (VITAMIN D) 2000 UNITS CAPS Take 1 capsule by mouth daily.        . folic acid (FOLVITE) 800 MCG tablet Take 800 mcg by mouth daily.        Marland Kitchen HYDROcodone-acetaminophen (HYCET) 7.5-325 mg/15 ml solution Take 5 mLs by mouth every 6 (six) hours as needed for pain (or cough).  240 mL  0  . ibuprofen (ADVIL) 600 MG tablet One tablet every 6 hours for 24 hours then every 6 hours prn pain.  15 tablet  1  . levothyroxine (SYNTHROID, LEVOTHROID) 75 MCG tablet Take 75 mcg by mouth daily.        Marland Kitchen losartan-hydrochlorothiazide (HYZAAR) 50-12.5 MG per tablet Take 1 tablet by mouth daily.        . Omega-3 Fatty Acids (FISH OIL) 1200 MG CAPS  Take 1-2 capsules by mouth daily.        Marland Kitchen PROBIOTIC CAPS Take 1 capsule by mouth daily.        . Saxagliptin-Metformin (KOMBIGLYZE XR) 5-500 MG TB24 Take by mouth.      . vitamin B-12 (CYANOCOBALAMIN) 1000 MCG tablet Take 1,000 mcg by mouth daily.         No current facility-administered medications on file prior to visit.    Review of Systems:  As per HPI- otherwise negative.   Physical Examination: Filed Vitals:   03/02/13 1302  BP: 120/72  Pulse: 94  Temp: 97.6 F (36.4 C)  Resp: 20   Filed Vitals:   03/02/13 1302  Height: 5\' 6"  (1.676 m)  Weight: 175 lb 12.8 oz (79.742 kg)   Body mass index is 28.39 kg/(m^2). Ideal Body Weight: Weight in (lb) to have BMI = 25: 154.6  GEN: WDWN, NAD, Non-toxic, A & O x 3 HEENT: Atraumatic, Normocephalic. Neck supple. No masses, No LAD. Ears and Nose: No external deformity. CV: RRR, No M/G/R. No JVD. No thrill. No extra heart sounds. PULM: CTA B, no wheezes, crackles, rhonchi. No  retractions. No resp. distress. No accessory muscle use. ABD: S, NT, ND, +BS. No rebound. No HSM. EXTR: No c/c/e NEURO Normal gait. Normal strength, sensation and DTR both LE.   PSYCH: Normally interactive. Conversant. Not depressed or anxious appearing.  Calm demeanor.  Back: tender and spasm in paraspinous muscles of the lumbar spine bilaterally.  Negative SLR Knees: minimal crepitus bilaterally   Assessment and Plan: Back spasm - Plan: cyclobenzaprine (FLEXERIL) 10 MG tablet  Arthritis of both knees  See patient instructions for more details.   Avoid combining flexeril and cough syrup.   Signed Abbe Amsterdam, MD

## 2013-06-28 DIAGNOSIS — G500 Trigeminal neuralgia: Secondary | ICD-10-CM

## 2013-06-28 HISTORY — DX: Trigeminal neuralgia: G50.0

## 2013-11-09 ENCOUNTER — Ambulatory Visit (INDEPENDENT_AMBULATORY_CARE_PROVIDER_SITE_OTHER): Payer: BC Managed Care – PPO | Admitting: Family Medicine

## 2013-11-09 VITALS — BP 180/95 | HR 90 | Temp 99.1°F | Resp 12 | Ht 64.5 in | Wt 177.0 lb

## 2013-11-09 DIAGNOSIS — E119 Type 2 diabetes mellitus without complications: Secondary | ICD-10-CM

## 2013-11-09 DIAGNOSIS — R42 Dizziness and giddiness: Secondary | ICD-10-CM

## 2013-11-09 LAB — POCT CBC
Granulocyte percent: 55.5 %G (ref 37–80)
HCT, POC: 40.3 % (ref 37.7–47.9)
Hemoglobin: 12.7 g/dL (ref 12.2–16.2)
Lymph, poc: 2 (ref 0.6–3.4)
MCH, POC: 27.4 pg (ref 27–31.2)
MCHC: 31.5 g/dL — AB (ref 31.8–35.4)
MCV: 87 fL (ref 80–97)
MID (cbc): 0.3 (ref 0–0.9)
MPV: 10.6 fL (ref 0–99.8)
POC Granulocyte: 2.9 (ref 2–6.9)
POC LYMPH PERCENT: 38.1 %L (ref 10–50)
POC MID %: 6.4 %M (ref 0–12)
Platelet Count, POC: 287 10*3/uL (ref 142–424)
RBC: 4.63 M/uL (ref 4.04–5.48)
RDW, POC: 14.7 %
WBC: 5.2 10*3/uL (ref 4.6–10.2)

## 2013-11-09 LAB — GLUCOSE, POCT (MANUAL RESULT ENTRY): POC Glucose: 103 mg/dl — AB (ref 70–99)

## 2013-11-09 NOTE — Patient Instructions (Signed)
Rest today.  Your blood pressure is now perfect at 130/70 and there are no signs of an impending stroke.  This dizzy spell may be from some dehydration and not having eaten breakfast.  Restart the probiotics for the abdominal pain.  Keep appointment with Dr. Baird Cancer.

## 2013-11-09 NOTE — Progress Notes (Addendum)
Subjective:    Patient ID: Christina Hernandez, female    DOB: May 30, 1951, 63 y.o.   MRN: [id number]  Dizziness Associated symptoms include abdominal pain and nausea. Pertinent negatives include no chest pain, numbness or weakness.  Abdominal Pain Associated symptoms include nausea.  Hypertension Pertinent negatives include no chest pain or shortness of breath.   Chief Complaint  Patient presents with   Dizziness    went to work - began leaning - dizzy, diabetic   Abdominal Pain    chronic x 1 year - painful at night   Hypertension    This chart was scribed for Christina Haber, MD by Thea Alken, ED Scribe. This patient was seen in room 5 and the patient's care was started at 9:24 AM.  HPI Comments: Christina Hernandez is a 63 y.o. female with h/o HTN and DM, who presents to the [facility] complaining of dizziness with associated nausea onset PTA . 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 Pt reports she drove herself to be seen. She states as she was walking into the facility she was felt dizzy. Pt denies numbness, tingling, weakness to extremities. She denies SOB and CP.  Pt reports abdominal pain onset 1 year. She reports seeing Dr. [name], MD for this pain. She report being unable to sleep due to pain.    Past Medical History  Diagnosis Date   Hypertension    Hypothyroidism    GERD (gastroesophageal reflux disease)     rarely occurs- no meds   Shortness of breath     pt states sob on exertion because she is "out of shape"   No Known Allergies Prior to Admission medications   Medication Sig Start Date End Date Taking? Authorizing Provider  b complex vitamins tablet Take 1 tablet by mouth daily.    Historical Provider, MD  Cholecalciferol (VITAMIN D) 2000 UNITS CAPS Take 1 capsule by mouth daily.      Historical Provider, MD  cyclobenzaprine (FLEXERIL) 10 MG tablet Take 1  tablet (10 mg total) by mouth 2 (two) times daily as needed for muscle spasms. 03/02/13   Gay Filler Copland, MD  folic acid (FOLVITE) 683 MCG tablet Take 800 mcg by mouth daily.      Historical Provider, MD  HYDROcodone-acetaminophen (HYCET) 7.5-325 mg/15 ml solution Take 5 mLs by mouth every 6 (six) hours as needed for pain (or cough). 02/16/13   Orma Flaming, MD  ibuprofen (ADVIL) 600 MG tablet One tablet every 6 hours for 24 hours then every 6 hours prn pain. 05/24/11   [name], MD  levothyroxine (SYNTHROID, LEVOTHROID) 75 MCG tablet Take 75 mcg by mouth daily.      Historical Provider, MD  losartan-hydrochlorothiazide (HYZAAR) 50-12.5 MG per tablet Take 1 tablet by mouth daily.      Historical Provider, MD  Omega-3 Fatty Acids (FISH OIL) 1200 MG CAPS Take 1-2 capsules by mouth daily.      Historical Provider, MD  PROBIOTIC CAPS Take 1 capsule by mouth daily.      Historical Provider, MD  Saxagliptin-Metformin (KOMBIGLYZE XR) 5-500 MG TB24 Take by mouth.    Historical Provider, MD  vitamin B-12 (CYANOCOBALAMIN) 1000 MCG tablet Take 1,000 mcg by mouth daily.      Historical Provider, MD   Review of Systems  Respiratory: Negative for shortness of breath.   Cardiovascular: Negative for chest  pain.  Gastrointestinal: Positive for nausea and abdominal pain.  Neurological: Positive for dizziness. Negative for weakness and numbness.  Psychiatric/Behavioral: Positive for sleep disturbance.      Objective:   Physical Exam  Nursing note and vitals reviewed. Constitutional: She is oriented to person, place, and time. She appears well-developed and well-nourished. No distress.  HENT:  Head: Normocephalic and atraumatic.  Right Ear: Hearing, tympanic membrane, external ear and ear canal normal.  Left Ear: Hearing, tympanic membrane, external ear and ear canal normal.  Eyes: Conjunctivae and EOM are normal. Pupils are equal, round, and reactive to light.  Neck: Normal range of motion. Neck  supple. No tracheal deviation present. No thyromegaly present.  Cardiovascular: Normal rate, regular rhythm and normal heart sounds.  Exam reveals no gallop and no friction rub.   No murmur heard. Pulmonary/Chest: Effort normal and breath sounds normal.  Abdominal: Soft. She exhibits no distension and no mass. There is no tenderness. There is no rebound and no guarding.  Musculoskeletal: Normal range of motion.  Lymphadenopathy:    She has no cervical adenopathy.  Neurological: She is alert and oriented to person, place, and time.  CN 2-12 intact   Skin: Skin is warm and dry.  Psychiatric: She has a normal mood and affect. Her behavior is normal.   Results for orders placed in visit on 11/09/13  POCT CBC      Result Value Ref Range   WBC 5.2  4.6 - 10.2 K/uL   Lymph, poc 2.0  0.6 - 3.4   POC LYMPH PERCENT 38.1  10 - 50 %L   MID (cbc) 0.3  0 - 0.9   POC MID % 6.4  0 - 12 %M   POC Granulocyte 2.9  2 - 6.9   Granulocyte percent 55.5  37 - 80 %G   RBC 4.63  4.04 - 5.48 M/uL   Hemoglobin 12.7  12.2 - 16.2 g/dL   HCT, POC 40.3  37.7 - 47.9 %   MCV 87.0  80 - 97 fL   MCH, POC 27.4  27 - 31.2 pg   MCHC 31.5 (*) 31.8 - 35.4 g/dL   RDW, POC 14.7     Platelet Count, POC 287  142 - 424 K/uL   MPV 10.6  0 - 99.8 fL  GLUCOSE, POCT (MANUAL RESULT ENTRY)      Result Value Ref Range   POC Glucose 103 (*) 70 - 99 mg/dl  BP recheck 130/70 Neg Romberg and stable gait 45 min after lying down.    Assessment & Plan:   1. DM (diabetes mellitus)   2. Dizziness    Meds ordered this encounter  Medications   Boswellia-Glucosamine-Vit D (GLUCOSAMINE COMPLEX PO)    Sig: Take by mouth.    Recommend that patient lie down and push fluids rest of day.  Call or return if symptoms return.  Christina Haber, MD

## 2014-01-01 ENCOUNTER — Emergency Department (HOSPITAL_COMMUNITY)
Admission: EM | Admit: 2014-01-01 | Discharge: 2014-01-01 | Disposition: A | Discharge status: Home / Self Care | Payer: BC Managed Care – PPO | Attending: Emergency Medicine | Admitting: Emergency Medicine

## 2014-01-01 ENCOUNTER — Ambulatory Visit (INDEPENDENT_AMBULATORY_CARE_PROVIDER_SITE_OTHER): Payer: BC Managed Care – PPO | Admitting: Family Medicine

## 2014-01-01 ENCOUNTER — Emergency Department (HOSPITAL_COMMUNITY): Payer: BC Managed Care – PPO

## 2014-01-01 ENCOUNTER — Encounter (HOSPITAL_COMMUNITY): Payer: Self-pay | Admitting: Emergency Medicine

## 2014-01-01 VITALS — BP 110/64 | HR 78 | Temp 97.7°F | Resp 24

## 2014-01-01 DIAGNOSIS — Z79899 Other long term (current) drug therapy: Secondary | ICD-10-CM | POA: Insufficient documentation

## 2014-01-01 DIAGNOSIS — E039 Hypothyroidism, unspecified: Secondary | ICD-10-CM | POA: Insufficient documentation

## 2014-01-01 DIAGNOSIS — Z8719 Personal history of other diseases of the digestive system: Secondary | ICD-10-CM | POA: Insufficient documentation

## 2014-01-01 DIAGNOSIS — R112 Nausea with vomiting, unspecified: Secondary | ICD-10-CM

## 2014-01-01 DIAGNOSIS — G518 Other disorders of facial nerve: Secondary | ICD-10-CM

## 2014-01-01 DIAGNOSIS — I10 Essential (primary) hypertension: Secondary | ICD-10-CM | POA: Insufficient documentation

## 2014-01-01 DIAGNOSIS — K047 Periapical abscess without sinus: Secondary | ICD-10-CM

## 2014-01-01 DIAGNOSIS — R209 Unspecified disturbances of skin sensation: Secondary | ICD-10-CM | POA: Insufficient documentation

## 2014-01-01 DIAGNOSIS — E119 Type 2 diabetes mellitus without complications: Secondary | ICD-10-CM | POA: Insufficient documentation

## 2014-01-01 DIAGNOSIS — G500 Trigeminal neuralgia: Secondary | ICD-10-CM

## 2014-01-01 LAB — CBC WITH DIFFERENTIAL/PLATELET
Basophils Absolute: 0 10*3/uL (ref 0.0–0.1)
Basophils Relative: 0 % (ref 0–1)
Eosinophils Absolute: 0 10*3/uL (ref 0.0–0.7)
Eosinophils Relative: 0 % (ref 0–5)
HCT: 36.6 % (ref 36.0–46.0)
Hemoglobin: 12.6 g/dL (ref 12.0–15.0)
Lymphocytes Relative: 18 % (ref 12–46)
Lymphs Abs: 1.8 10*3/uL (ref 0.7–4.0)
MCH: 27.9 pg (ref 26.0–34.0)
MCHC: 34.4 g/dL (ref 30.0–36.0)
MCV: 81 fL (ref 78.0–100.0)
Monocytes Absolute: 0.5 10*3/uL (ref 0.1–1.0)
Monocytes Relative: 5 % (ref 3–12)
Neutro Abs: 7.6 10*3/uL (ref 1.7–7.7)
Neutrophils Relative %: 77 % (ref 43–77)
Platelets: 262 10*3/uL (ref 150–400)
RBC: 4.52 MIL/uL (ref 3.87–5.11)
RDW: 14.2 % (ref 11.5–15.5)
WBC: 9.9 10*3/uL (ref 4.0–10.5)

## 2014-01-01 LAB — CBG MONITORING, ED: Glucose-Capillary: 105 mg/dL — ABNORMAL HIGH (ref 70–99)

## 2014-01-01 MED ORDER — OXYCODONE-ACETAMINOPHEN 5-325 MG PO TABS: 2.0000 | ORAL_TABLET | ORAL | Status: DC | PRN

## 2014-01-01 MED ORDER — PENICILLIN V POTASSIUM 500 MG PO TABS: 500.0000 mg | ORAL_TABLET | Freq: Four times a day (QID) | ORAL | Status: DC

## 2014-01-01 MED ORDER — GABAPENTIN 300 MG PO CAPS: 300.0000 mg | ORAL_CAPSULE | Freq: Three times a day (TID) | ORAL | Status: DC

## 2014-01-01 MED ORDER — GABAPENTIN 300 MG PO CAPS
300.0000 mg | ORAL_CAPSULE | Freq: Once | ORAL | Status: AC
  Administered 2014-01-01: 300 mg via ORAL
  Filled 2014-01-01: qty 1

## 2014-01-01 MED ORDER — ONDANSETRON 4 MG PO TBDP
8.0000 mg | ORAL_TABLET | Freq: Once | ORAL | Status: AC
  Administered 2014-01-01: 8 mg via ORAL

## 2014-01-01 MED ORDER — ONDANSETRON 8 MG PO TBDP: 8.0000 mg | ORAL_TABLET | Freq: Three times a day (TID) | ORAL | Status: DC | PRN

## 2014-01-01 MED ORDER — OXYCODONE-ACETAMINOPHEN 5-325 MG PO TABS
1.0000 | ORAL_TABLET | Freq: Once | ORAL | Status: AC
  Administered 2014-01-01: 1 via ORAL
  Filled 2014-01-01: qty 1

## 2014-01-01 MED ORDER — HYDROCODONE-ACETAMINOPHEN 5-325 MG PO TABS: 1.0000 | ORAL_TABLET | Freq: Four times a day (QID) | ORAL | Status: DC | PRN

## 2014-01-01 MED ORDER — GABAPENTIN 600 MG PO TABS
300.0000 mg | ORAL_TABLET | Freq: Once | ORAL | Status: DC
  Filled 2014-01-01: qty 0.5

## 2014-01-01 NOTE — ED Notes (Signed)
Presents with left sided facial numbness began began Sunday, started as an ache in tooth and has progressed to left sided mouth and cheek numbness. Pt reports there is pain in left side of face described as stinging and burning.  No arm drift or facial droop, no slurred speech. Alert, oriented.

## 2014-01-01 NOTE — ED Notes (Signed)
Patient transported to CT 

## 2014-01-01 NOTE — ED Provider Notes (Signed)
CSN: [id number]     Arrival date & time 01/01/14  0124 History   First MD Initiated Contact with Patient 01/01/14 [phone]     Chief Complaint  Patient presents with  . Numbness     (Consider location/radiation/quality/duration/timing/severity/associated sxs/prior Treatment) HPI 63 year old female presents to emergency room with complaint of numbness and pain to her left face.  Started 2 days ago.  She reports an aching in her left lower jaw, and a burning pain over her left face with pins and needle sensation.  Area of paresthesias and pain starts just anterior to her left ear, goes up under her left eye occurs around her nose and splits her mouth and Kerlix down to her chin.  No prior history of same.  Patient had a crown placed several months ago.  She denies any swelling or fever chills.  No swelling of the gums.  Patient has followup with her dentist tomorrow, but reports pain tonight was so that bring her to the ER.  Family is concerned about possible stroke.  Patient has history of chickenpox as a kid.  No prior history of shingles.  She's not noticed any rash.  No other focal neurologic signs. Past Medical History  Diagnosis Date  . Hypertension   . Hypothyroidism   . GERD (gastroesophageal reflux disease)     rarely occurs- no meds  . Shortness of breath     pt states sob on exertion because she is "out of shape"  . Diabetes    Past Surgical History  Procedure Laterality Date  . Uterine polyp    . Tubal ligation    . Hysteroscopy w/d&c  05/24/2011    Procedure: DILATATION AND CURETTAGE (D&C) /HYSTEROSCOPY;  Surgeon: [name], MD;  Location: [facility];  Service: Gynecology;  Laterality: N/A;   Family History  Problem Relation Age of Onset  . Hypertension Mother   . Heart disease Mother    History  Substance Use Topics  . Smoking status: Never Smoker   . Smokeless tobacco: Not on file  . Alcohol Use: No   OB History   Grav Para Term Preterm Abortions TAB SAB Ect Mult  Living                 Review of Systems   See History of Present Illness; otherwise all other systems are reviewed and negative  Allergies  Review of patient's allergies indicates no known allergies.  Home Medications   Prior to Admission medications   Medication Sig Start Date End Date Taking? Authorizing Provider  ibuprofen (ADVIL,MOTRIN) 200 MG tablet Take 400-600 mg by mouth every 6 (six) hours as needed for moderate pain.   Yes Historical Provider, MD  levothyroxine (SYNTHROID, LEVOTHROID) 75 MCG tablet Take 75 mcg by mouth daily.     Yes Historical Provider, MD  losartan-hydrochlorothiazide (HYZAAR) 50-12.5 MG per tablet Take 1 tablet by mouth daily.     Yes Historical Provider, MD  Multiple Vitamin (MULTIVITAMIN WITH MINERALS) TABS tablet Take 1 tablet by mouth daily.   Yes Historical Provider, MD  PROBIOTIC CAPS Take 1 capsule by mouth daily.     Yes Historical Provider, MD  Saxagliptin-Metformin (KOMBIGLYZE XR) 5-500 MG TB24 Take 1 tablet by mouth daily.    Yes Historical Provider, MD  gabapentin (NEURONTIN) 300 MG capsule Take 1 capsule (300 mg total) by mouth 3 (three) times daily. 01/01/14   Kalman Drape, MD  oxyCODONE-acetaminophen (PERCOCET/ROXICET) 5-325 MG per tablet Take 2 tablets by mouth  every 4 (four) hours as needed for severe pain. 01/01/14   Kalman Drape, MD   BP 147/73  Pulse 78  Temp(Src) 98 F (36.7 C) (Oral)  Resp 16  Ht 5\' 5"  (1.651 m)  Wt 176 lb (79.833 kg)  BMI 29.29 kg/m2  SpO2 98% Physical Exam  Nursing note and vitals reviewed. Constitutional: She is oriented to person, place, and time. She appears well-developed and well-nourished.  HENT:  Head: Normocephalic and atraumatic.  Right Ear: External ear normal.  Left Ear: External ear normal.  Nose: Nose normal.  Mouth/Throat: Oropharynx is clear and moist.  Eyes: Conjunctivae and EOM are normal. Pupils are equal, round, and reactive to light.  Neck: Normal range of motion. Neck supple. No JVD  present. No tracheal deviation present. No thyromegaly present.  Cardiovascular: Normal rate, regular rhythm, normal heart sounds and intact distal pulses.  Exam reveals no gallop and no friction rub.   No murmur heard. Pulmonary/Chest: Effort normal and breath sounds normal. No stridor. No respiratory distress. She has no wheezes. She has no rales. She exhibits no tenderness.  Abdominal: Soft. Bowel sounds are normal. She exhibits no distension and no mass. There is no tenderness. There is no rebound and no guarding.  Musculoskeletal: Normal range of motion. She exhibits no edema and no tenderness.  Lymphadenopathy:    She has no cervical adenopathy.  Neurological: She is alert and oriented to person, place, and time. She has normal reflexes. No cranial nerve deficit. She exhibits normal muscle tone. Coordination normal.  Patient reports a slight decrease in sensation over left chin  Skin: Skin is warm and dry. No rash noted. No erythema. No pallor.  Psychiatric: She has a normal mood and affect. Her behavior is normal. Judgment and thought content normal.    ED Course  Procedures (including critical care time) Labs Review Labs Reviewed  CBG MONITORING, ED - Abnormal; Notable for the following:    Glucose-Capillary 105 (*)    All other components within normal limits    Imaging Review Ct Head Wo Contrast  01/01/2014   CLINICAL DATA:  NUMBNESS  EXAM: CT HEAD WITHOUT CONTRAST  TECHNIQUE: Contiguous axial images were obtained from the base of the skull through the vertex without intravenous contrast.  COMPARISON:  None.  FINDINGS: There is no acute intracranial hemorrhage or infarct. No mass lesion or midline shift. Gray-white matter differentiation is well maintained. Ventricles are normal in size without evidence of hydrocephalus. CSF containing spaces are within normal limits. No extra-axial fluid collection.  The calvarium is intact.  Orbital soft tissues are within normal limits.  The  paranasal sinuses and mastoid air cells are well pneumatized and free of fluid.  Scalp soft tissues are unremarkable.  IMPRESSION: Normal head CT with no acute intracranial abnormality identified.   Electronically Signed   By: Jeannine Boga M.D.   On: 01/01/2014 02:46     EKG Interpretation None      MDM   Final diagnoses:  Neuralgic facial pain   63 year old female with neuralgic facial pain.  Differential includes trigeminal neuralgia,  early shingles presentation the patient to followup with her primary care Dr. for recheck in 2-3 days.  She has an appointment with her dentist later today.    Kalman Drape, MD 01/01/14 [phone]

## 2014-01-01 NOTE — Discharge Instructions (Signed)

## 2014-01-01 NOTE — Patient Instructions (Signed)
Please follow up with me on Thursday from 9-12.  Lets start you on penicillin for your teeth.  Try the hydrocodone for pain and take the zofran as needed for nausea. If you develop any worsening numbness, rash, vision changes, face weakness and facial asymmetry, make sure come back immediately.  Dental Abscess A dental abscess is a collection of infected fluid (pus) from a bacterial infection in the inner part of the tooth (pulp). It usually occurs at the end of the tooth's root.  CAUSES   Severe tooth decay.  Trauma to the tooth that allows bacteria to enter into the pulp, such as a broken or chipped tooth. SYMPTOMS   Severe pain in and around the infected tooth.  Swelling and redness around the abscessed tooth or in the mouth or face.  Tenderness.  Pus drainage.  Bad breath.  Bitter taste in the mouth.  Difficulty swallowing.  Difficulty opening the mouth.  Nausea.  Vomiting.  Chills.  Swollen neck glands. DIAGNOSIS   A medical and dental history will be taken.  An examination will be performed by tapping on the abscessed tooth.  X-rays may be taken of the tooth to identify the abscess. TREATMENT The goal of treatment is to eliminate the infection. You may be prescribed antibiotic medicine to stop the infection from spreading. A root canal may be performed to save the tooth. If the tooth cannot be saved, it may be pulled (extracted) and the abscess may be drained.  HOME CARE INSTRUCTIONS  Only take over-the-counter or prescription medicines for pain, fever, or discomfort as directed by your caregiver.  Rinse your mouth (gargle) often with salt water ( tsp salt in 8 oz [250 ml] of warm water) to relieve pain or swelling.  Do not drive after taking pain medicine (narcotics).  Do not apply heat to the outside of your face.  Return to your dentist for further treatment as directed. SEEK MEDICAL CARE IF:  Your pain is not helped by medicine.  Your pain is  getting worse instead of better. SEEK IMMEDIATE MEDICAL CARE IF:  You have a fever or persistent symptoms for more than 2-3 days.  You have a fever and your symptoms suddenly get worse.  You have chills or a very bad headache.  You have problems breathing or swallowing.  You have trouble opening your mouth.  You have swelling in the neck or around the eye. Document Released: 06/14/2005 Document Revised: 03/08/2012 Document Reviewed: 09/22/2010 [facility] Patient Information 2015 [place], [place]. This information is not intended to replace advice given to you by your health care provider. Make sure you discuss any questions you have with your health care provider.

## 2014-01-01 NOTE — Progress Notes (Signed)
Subjective:  This chart was scribed for Delman Cheadle, MD, by Neta Ehlers, ED Scribe. This  patient's care was started at 9:31 AM.    Patient ID: Christina Hernandez, female    DOB: 10-21-1950, 63 y.o.   MRN: [id number]  Chief Complaint  Patient presents with  . Emesis    this morning  . Facial Pain    x 3 days  . Numbness    Lt side of face numb since Sunday    HPI HPI Comments: Christina Hernandez is a 63 y.o. female with a h/o HTN, DM, and hypothyroidism, who presents in the care of her adult son and daughter to [facility] with nausea and emesis.   Per medical records:  Two days of numbness and left facial pain, aching on left lower jaw with pins and needles, radiates around left eye, nose, and mouth. The pt had a follow up appointment with her dentist, Dr. Burnard Bunting, scheduled today at 8:30 am, but the pt had to go to the ER last night instead due to severity of pain. A head CT was done which was normal, and CBG was normal. No other labs done in ER. The ER physician though it was likely trigeminall neuralgia vs. early shingles, and recommended pt check in 2-3 days with PCP to ensure she did not develop shingles. In the ER prior to discharge the pt was given gabapentin 300 mg and a second oxycodone 5 mg. The pt was prescribed gabapentin 300 mg as well as percocet 5, 2 tabs q4 prn. Pt called the ER that she was nausea and vomiting due to medications, and they recommended she follow-up with PCP.  Ms. Oland PCP is Glendale Chard, who is currently on vacation so she came here instead.  She was not having any n/v prior to receiving the gabapentin and oxycodone upon leaving the ER. She still has her rxs that were written in the ER - has not filled them yet. She has not had these medications prior.  Her dentist said that he could not see her this a.m. with the acute health problems inc n/v so rec she be evaluated by an MD and reschedule.  She denies a current rash. She reports minimal improvement to the  facial pain, but endorses continual numbness and persistent nausea.   The pt denies dizziness or lightheadedness. She also denies recent antibiotics.    Past Medical History  Diagnosis Date  . Hypertension   . Hypothyroidism   . GERD (gastroesophageal reflux disease)     rarely occurs- no meds  . Shortness of breath     pt states sob on exertion because she is "out of shape"  . Diabetes     Current Outpatient Prescriptions on File Prior to Visit  Medication Sig Dispense Refill  . ibuprofen (ADVIL,MOTRIN) 200 MG tablet Take 400-600 mg by mouth every 6 (six) hours as needed for moderate pain.      Marland Kitchen levothyroxine (SYNTHROID, LEVOTHROID) 75 MCG tablet Take 75 mcg by mouth daily.        Marland Kitchen losartan-hydrochlorothiazide (HYZAAR) 50-12.5 MG per tablet Take 1 tablet by mouth daily.        . Multiple Vitamin (MULTIVITAMIN WITH MINERALS) TABS tablet Take 1 tablet by mouth daily.      Marland Kitchen PROBIOTIC CAPS Take 1 capsule by mouth daily.        . Saxagliptin-Metformin (KOMBIGLYZE XR) 5-500 MG TB24 Take 1 tablet by mouth daily.       Marland Kitchen  gabapentin (NEURONTIN) 300 MG capsule Take 1 capsule (300 mg total) by mouth 3 (three) times daily.  60 capsule  0  . oxyCODONE-acetaminophen (PERCOCET/ROXICET) 5-325 MG per tablet Take 2 tablets by mouth every 4 (four) hours as needed for severe pain.  20 tablet  0   No current facility-administered medications on file prior to visit.    No Known Allergies   Review of Systems  Constitutional: Negative for fever, chills, activity change and appetite change.  HENT: Positive for dental problem. Negative for congestion, drooling, facial swelling, hearing loss and trouble swallowing.   Cardiovascular: Negative for chest pain.  Gastrointestinal: Positive for nausea and vomiting. Negative for abdominal pain, diarrhea and constipation.  Musculoskeletal: Positive for myalgias and neck pain. Negative for neck stiffness.  Skin: Negative for color change and rash.    Neurological: Positive for facial asymmetry, numbness and headaches. Negative for dizziness, seizures, syncope, speech difficulty, weakness and light-headedness.  Hematological: Positive for adenopathy.  Psychiatric/Behavioral: Positive for sleep disturbance.    Triage Vitals: BP 110/64  Pulse 78  Temp(Src) 97.7 F (36.5 C) (Oral)  Resp 24  SpO2 94%    Objective:   Physical Exam  Nursing note and vitals reviewed. Constitutional: She is oriented to person, place, and time. She appears well-developed and well-nourished. She appears lethargic. She does not have a sickly appearance. She does not appear ill. She appears distressed.  Pt moaning, laying on stretcher clutching emesis basin  HENT:  Head: Normocephalic and atraumatic.  Mouth/Throat: Oropharynx is clear and moist. Dental caries present. No oropharyngeal exudate, posterior oropharyngeal edema or posterior oropharyngeal erythema.  Severe tenderness through lower mandibular area.  Symmetric palate rise.  Large caries in left, lower molar.  No edema, erythema, or warmth to gingiva.  Right upper posterior with decay and caries.   Eyes: Conjunctivae and EOM are normal.  Neck: Neck supple. No tracheal deviation present.  Thyroid goiter.   Cardiovascular: Normal rate.   Pulmonary/Chest: Effort normal. No respiratory distress.  Musculoskeletal: Normal range of motion.  Lymphadenopathy:       Head (right side): Submandibular adenopathy present.       Head (left side): Submandibular adenopathy present.  Left more than right submandibular lymphadenopathy.   Neurological: She is oriented to person, place, and time. She has normal strength. She appears lethargic. She displays no tremor. A cranial nerve deficit and sensory deficit is present. She exhibits normal muscle tone. Gait normal. GCS eye subscore is 4. GCS verbal subscore is 5. GCS motor subscore is 6.  CN 2-4, 6-12 intact, numbness in left V3 distribution. Normal fascial nerve   Skin: Skin is warm and dry. No rash noted.  Psychiatric: She has a normal mood and affect. Her behavior is normal.       Assessment & Plan:  Pt was checked in [place] which showed no prior controlled medications.  Shredded ER prescriptions for gabapentin and oxycodone.  Advised pt to follow-up in two days.  Nausea and vomiting, vomiting of unspecified type - Plan: ondansetron (ZOFRAN-ODT) disintegrating tablet 8 mg, CBC with Differential  Dental abscess  Trigeminal neuralgia of left side of face Unlikely to be shingles due to distribution over left entire face and neck along with numbness over left V3 distribution. Will cover w/ antibiotic for dental infection due to tender lymphadenopathy and known dental problems. Recheck in office in 2d.  Severe n/v likely secondary to med reaction to oxycodone or gabapentin.  At time of d/c, pt  able to drink H2O w/o emesis so sent home to try pain control w/ prn hydrocodone - try 1/2 tab first. If doing ok on that w/o n/v and still having pain, then can retry gabapentin. Meds ordered this encounter  Medications  . HYDROcodone-acetaminophen (NORCO/VICODIN) 5-325 MG per tablet    Sig: Take 1 tablet by mouth every 6 (six) hours as needed for moderate pain.    Dispense:  30 tablet    Refill:  0  . ondansetron (ZOFRAN ODT) 8 MG disintegrating tablet    Sig: Take 1 tablet (8 mg total) by mouth every 8 (eight) hours as needed for nausea or vomiting.    Dispense:  20 tablet    Refill:  0  . ondansetron (ZOFRAN-ODT) disintegrating tablet 8 mg    Sig:   . gabapentin (NEURONTIN) 300 MG capsule    Sig: Take 1 capsule (300 mg total) by mouth 3 (three) times daily.    Dispense:  60 capsule    Refill:  0  . penicillin v potassium (VEETID) 500 MG tablet    Sig: Take 1 tablet (500 mg total) by mouth 4 (four) times daily.    Dispense:  28 tablet    Refill:  0    I personally performed the services described in this documentation,  which was scribed in my presence. The recorded information has been reviewed and considered, and addended by me as needed.  Delman Cheadle, MD MPH

## 2014-01-01 NOTE — Progress Notes (Signed)
CM received phone call from patient stating that she was discharged from the ED with 2 prescriptions. She stated that she filled both prescriptions and is now nauseas and vomiting due to the medications. The patient stated that she is not sure which medication is causing this since she has taken both medications. This CM encouraged patient to stop taking medications and either go to an urgent care facility or call her PCP to schedule an follow up appointment with regards to medication reaction. Explained that she would need to be seen again for follow up since the EDP that treated her is no longer present in the ED. Patient verbalized understanding. Edwyna Shell, RN, BSN, Case Managers 01/01/2014 7:56 AM [phone]

## 2014-01-01 NOTE — ED Notes (Signed)
Patient presents stating that the left side of her face is numb and tingling since Sunday.   Has an impacted wisdom tooth removed in Dec or Jan, root canal and crown on the left side.  Has been taking Ibuprofen for the pain but that does not seem to work.

## 2014-03-13 ENCOUNTER — Encounter (INDEPENDENT_AMBULATORY_CARE_PROVIDER_SITE_OTHER): Payer: Self-pay

## 2014-03-13 ENCOUNTER — Ambulatory Visit (INDEPENDENT_AMBULATORY_CARE_PROVIDER_SITE_OTHER): Payer: BC Managed Care – PPO | Admitting: Diagnostic Neuroimaging

## 2014-03-13 ENCOUNTER — Encounter: Payer: Self-pay | Admitting: Diagnostic Neuroimaging

## 2014-03-13 VITALS — BP 127/77 | HR 101 | Temp 97.7°F | Ht 65.5 in | Wt 166.6 lb

## 2014-03-13 DIAGNOSIS — R200 Anesthesia of skin: Secondary | ICD-10-CM

## 2014-03-13 DIAGNOSIS — R209 Unspecified disturbances of skin sensation: Secondary | ICD-10-CM

## 2014-03-13 DIAGNOSIS — G500 Trigeminal neuralgia: Secondary | ICD-10-CM

## 2014-03-13 MED ORDER — GABAPENTIN 300 MG PO CAPS: 600.0000 mg | ORAL_CAPSULE | Freq: Three times a day (TID) | ORAL | Status: DC

## 2014-03-13 NOTE — Progress Notes (Signed)
[facility]  PATIENT: Christina Hernandez DOB: 09-05-1950  REFERRING CLINICIAN: [place] HISTORY FROM: patient  REASON FOR VISIT: new consult   HISTORICAL  CHIEF COMPLAINT:  Chief Complaint  Patient presents with  . Numbness  . Pain    HISTORY OF PRESENT ILLNESS:   63 year old right-handed female here for evaluation of left trigeminal neuralgia. January 2014 patient had second and third molar impaction, treated with extraction. Patient did well with the procedure. In May 2015 she developed "heavy feeling" in her left jaw. In 12/31/2013 patient had severe pain in her left lower jaw. She numbness in her left lower teeth, gums, Chin, jaw. Patient was noted to have left submandibular swelling, treated with antibiotics. CT scan showed florid cemento-osseous dysplasia, and formal bone biopsy showed vital bone and no osteomyelitis. Patient was presumed to have left trigeminal neuralgia, mandibular branch, and started on gabapentin 300 mg daily.  Since that time swelling in the left abdomen the region has reduced. She still has pain in the left jaw. She has significant numbness, tingling, very sensation in her left chin and left lower lip region.    REVIEW OF SYSTEMS: Full 14 system review of systems performed and notable only for insomnia weight loss.  ALLERGIES: No Known Allergies  HOME MEDICATIONS: Outpatient Prescriptions Prior to Visit  Medication Sig Dispense Refill  . ibuprofen (ADVIL,MOTRIN) 200 MG tablet Take 400-600 mg by mouth every 6 (six) hours as needed for moderate pain.      Marland Kitchen levothyroxine (SYNTHROID, LEVOTHROID) 75 MCG tablet Take 75 mcg by mouth daily.        Marland Kitchen losartan-hydrochlorothiazide (HYZAAR) 50-12.5 MG per tablet Take 1 tablet by mouth daily.        . Multiple Vitamin (MULTIVITAMIN WITH MINERALS) TABS tablet Take 1 tablet by mouth daily.      Marland Kitchen PROBIOTIC CAPS Take 1 capsule by mouth daily.        . Saxagliptin-Metformin (KOMBIGLYZE XR) 5-500  MG TB24 Take 1 tablet by mouth daily.       Marland Kitchen gabapentin (NEURONTIN) 300 MG capsule Take 1 capsule (300 mg total) by mouth 3 (three) times daily.  60 capsule  0  . HYDROcodone-acetaminophen (NORCO/VICODIN) 5-325 MG per tablet Take 1 tablet by mouth every 6 (six) hours as needed for moderate pain.  30 tablet  0  . ondansetron (ZOFRAN ODT) 8 MG disintegrating tablet Take 1 tablet (8 mg total) by mouth every 8 (eight) hours as needed for nausea or vomiting.  20 tablet  0  . penicillin v potassium (VEETID) 500 MG tablet Take 1 tablet (500 mg total) by mouth 4 (four) times daily.  28 tablet  0   No facility-administered medications prior to visit.    PAST MEDICAL HISTORY: Past Medical History  Diagnosis Date  . Hypertension   . Hypothyroidism   . GERD (gastroesophageal reflux disease)     rarely occurs- no meds  . Shortness of breath     pt states sob on exertion because she is "out of shape"  . Diabetes     PAST SURGICAL HISTORY: Past Surgical History  Procedure Laterality Date  . Uterine polyp    . Tubal ligation    . Hysteroscopy w/d&c  05/24/2011    Procedure: DILATATION AND CURETTAGE (D&C) /HYSTEROSCOPY;  Surgeon: [name], MD;  Location: [facility];  Service: Gynecology;  Laterality: N/A;    FAMILY HISTORY: Family History  Problem Relation Age of Onset  . Hypertension Mother   .  Heart disease Mother   . Kidney failure Mother   . Heart Problems Father   . Heart Problems Sister   . Cardiomyopathy Sister   . Ovarian cancer Sister     SOCIAL HISTORY:  History   Social History  . Marital Status: Married    Spouse Name: [name]    Number of Children: 4  . Years of Education: BS   Occupational History  . Retired Other   Social History Main Topics  . Smoking status: Never Smoker   . Smokeless tobacco: Never Used  . Alcohol Use: No  . Drug Use: No  . Sexual Activity: Not on file   Other Topics Concern  . Not on file   Social History Narrative   Patient lives at  home with family.   Caffeine Use: occasionally     PHYSICAL EXAM  Filed Vitals:   03/13/14 1340  BP: 127/77  Pulse: 101  Temp: 97.7 F (36.5 C)  TempSrc: Oral  Height: 5' 5.5" (1.664 m)  Weight: 166 lb 9.6 oz (75.569 kg)    Not recorded    Body mass index is 27.29 kg/(m^2).  GENERAL EXAM: Patient is in no distress; well developed, nourished and groomed; neck is supple; ENLARGED THYROID (NONTENDER)  CARDIOVASCULAR: Regular rate and rhythm, no murmurs, no carotid bruits  NEUROLOGIC: MENTAL STATUS: awake, alert, oriented to person, place and time, recent and remote memory intact, normal attention and concentration, language fluent, comprehension intact, naming intact, fund of knowledge appropriate CRANIAL NERVE: no papilledema on fundoscopic exam, pupils equal and reactive to light, visual fields full to confrontation, extraocular muscles intact, no nystagmus, DECR FACIAL SENSATION IN LEFT CHIN; ALLODYNIA IN LEFT V3 DISTRIBUTION. FACIAL STRENGTH SYMM, hearing intact, palate elevates symmetrically, uvula midline, shoulder shrug symmetric, tongue midline. MOTOR: normal bulk and tone, full strength in the BUE, BLE SENSORY: normal and symmetric to light touch, pinprick, temperature, vibration COORDINATION: finger-nose-finger, fine finger movements normal REFLEXES: deep tendon reflexes TRACE and symmetric GAIT/STATION: narrow based gait; able to walk tandem; romberg is negative    DIAGNOSTIC DATA (LABS, IMAGING, TESTING) - I reviewed patient records, labs, notes, testing and imaging myself where available.  Lab Results  Component Value Date   WBC 9.9 01/01/2014   HGB 12.6 01/01/2014   HCT 36.6 01/01/2014   MCV 81.0 01/01/2014   PLT 262 01/01/2014      Component Value Date/Time   NA 136 05/18/2011 1004   K 3.2* 05/18/2011 1004   CL 98 05/18/2011 1004   CO2 26 05/18/2011 1004   GLUCOSE 102* 05/18/2011 1004   BUN 14 05/18/2011 1004   CREATININE 0.65 05/18/2011 1004   CALCIUM 9.7  05/18/2011 1004   GFRNONAA >90 05/18/2011 1004   GFRAA >90 05/18/2011 1004   No results found for this basename: CHOL, HDL, LDLCALC, LDLDIRECT, TRIG, CHOLHDL   No results found for this basename: HGBA1C   No results found for this basename: VITAMINB12   No results found for this basename: TSH      ASSESSMENT AND PLAN  63 y.o. year old female here with left trigeminal nerve pain, in setting of multiple dental procedures in the left mandibular region, and recent presumed infx/abscess treated with antibiotics.   Dx: left trigeminal neuralgia  PLAN: - MRI brain (with and without) to look for other causes - gabapentin --> increase up to 600mg  TID over next 1 month as tolerated  Orders Placed This Encounter  Procedures  . MR Brain W Wo Contrast  Meds ordered this encounter  Medications  . gabapentin (NEURONTIN) 300 MG capsule    Sig: Take 2 capsules (600 mg total) by mouth 3 (three) times daily.    Dispense:  180 capsule    Refill:  6   Return in about 6 weeks (around 04/24/2014).    Penni Bombard, MD 01/03/6437, 3:81 PM Certified in Neurology, Neurophysiology and Neuroimaging  [facility] [address] [place], [place] [address] [phone]

## 2014-03-13 NOTE — Patient Instructions (Signed)
Gradually increase gabapentin to 600mg  three times per day.  I will check MRI brain.  Follow up with PCP about thyroid function and enlarged thyroid.

## 2014-03-20 ENCOUNTER — Other Ambulatory Visit: Payer: BC Managed Care – PPO

## 2014-03-20 ENCOUNTER — Ambulatory Visit (INDEPENDENT_AMBULATORY_CARE_PROVIDER_SITE_OTHER): Payer: BC Managed Care – PPO

## 2014-03-20 DIAGNOSIS — R200 Anesthesia of skin: Secondary | ICD-10-CM

## 2014-03-20 DIAGNOSIS — G500 Trigeminal neuralgia: Secondary | ICD-10-CM

## 2014-03-20 DIAGNOSIS — R209 Unspecified disturbances of skin sensation: Secondary | ICD-10-CM

## 2014-03-21 ENCOUNTER — Other Ambulatory Visit: Payer: Self-pay | Admitting: Diagnostic Neuroimaging

## 2014-03-21 DIAGNOSIS — G500 Trigeminal neuralgia: Secondary | ICD-10-CM

## 2014-03-21 DIAGNOSIS — R200 Anesthesia of skin: Secondary | ICD-10-CM

## 2014-03-27 ENCOUNTER — Telehealth: Payer: Self-pay | Admitting: Diagnostic Neuroimaging

## 2014-03-27 NOTE — Telephone Encounter (Signed)
Patient calling to state that since the Neurontin was increased she is feeling slightly dazed. She would like a call back regarding the symptoms. Best number is [phone].

## 2014-03-27 NOTE — Telephone Encounter (Signed)
Patient calling for results of her recent MRI. Best number to call is [phone]. It is okay to leave a detailed message at that number.

## 2014-03-27 NOTE — Telephone Encounter (Signed)
I called pt. No answer. -VRP

## 2014-04-02 ENCOUNTER — Other Ambulatory Visit: Payer: Self-pay | Admitting: Internal Medicine

## 2014-04-02 DIAGNOSIS — E049 Nontoxic goiter, unspecified: Secondary | ICD-10-CM

## 2014-04-05 ENCOUNTER — Telehealth: Payer: Self-pay | Admitting: Diagnostic Neuroimaging

## 2014-04-05 ENCOUNTER — Other Ambulatory Visit (HOSPITAL_COMMUNITY): Payer: Self-pay | Admitting: Internal Medicine

## 2014-04-05 ENCOUNTER — Ambulatory Visit
Admission: RE | Admit: 2014-04-05 | Discharge: 2014-04-05 | Disposition: A | Discharge status: Home / Self Care | Payer: BC Managed Care – PPO | Source: Ambulatory Visit | Attending: Internal Medicine | Admitting: Internal Medicine

## 2014-04-05 DIAGNOSIS — E049 Nontoxic goiter, unspecified: Secondary | ICD-10-CM

## 2014-04-05 DIAGNOSIS — Z1231 Encounter for screening mammogram for malignant neoplasm of breast: Secondary | ICD-10-CM

## 2014-04-05 NOTE — Telephone Encounter (Signed)
Patient calling for MRI results.  Please call anytime and may detailed message on voicemail.

## 2014-04-08 ENCOUNTER — Telehealth: Payer: Self-pay | Admitting: Diagnostic Neuroimaging

## 2014-04-08 NOTE — Telephone Encounter (Signed)
Patient requesting results of recent MRI. She also stated at last visit Dr. [name] was to have written her a prescription for Neurontin and is questioning if he called this in to her pharmacy. Best call back number is [phone]. It is okay to leave a detailed message at this number.

## 2014-04-08 NOTE — Patient Instructions (Addendum)
Patient stopped by the office requesting MRI results. Gave results per Dr. [name]. Patient stated she has not received gabapentin (NEURONTIN) 300 MG capsule Rx. Pharmacy stated not received. I called pharmacy verified Rx received and picked up by patient on 03/26/14. Will call patient tomorrow and confirm.

## 2014-04-08 NOTE — Telephone Encounter (Signed)
MRI shows non-specific scar spots, nothing major. Continue current plan. I sent in gabapentin e-rx at last visit. Will see patient in follow up. -VRP

## 2014-04-09 ENCOUNTER — Telehealth: Payer: Self-pay

## 2014-04-09 NOTE — Telephone Encounter (Signed)
Patient stated she picked up 10 day supply of Gabapentin prescribed by Dr. Buelah Manis on 9/29.  Pharmacy told her they never received Rx refill request for gabapentin (NEURONTIN) 300 MG capsule from Dr. [name].  Please call and advise

## 2014-04-09 NOTE — Telephone Encounter (Signed)
Spoke to spouse. Not showing on HIPPA.  He said he would give patient the message to return our call.

## 2014-04-10 NOTE — Patient Instructions (Signed)
Spoke to patient and pharmacy. Pharmacy made a mistake when they confirmed Rx was picked up by patient on 03/26/14. The Rx picked up was Rx'd by Dr. Buelah Manis. The pharmacist said they did not receive our Rx. Gave verbal order for Dr. Gladstone Lighter Rx.

## 2014-04-10 NOTE — Telephone Encounter (Addendum)
Spoke to spouse. He will be going with the patient by the pharmacy this evening, hopefully to straighten the Rx confusion out.

## 2014-04-10 NOTE — Telephone Encounter (Signed)
Duplicate message. 

## 2014-04-19 ENCOUNTER — Ambulatory Visit (HOSPITAL_COMMUNITY)
Admission: RE | Admit: 2014-04-19 | Discharge: 2014-04-19 | Disposition: A | Discharge status: Home / Self Care | Payer: BC Managed Care – PPO | Source: Ambulatory Visit | Attending: Internal Medicine | Admitting: Internal Medicine

## 2014-04-19 DIAGNOSIS — Z1231 Encounter for screening mammogram for malignant neoplasm of breast: Secondary | ICD-10-CM | POA: Diagnosis not present

## 2014-04-24 ENCOUNTER — Ambulatory Visit: Payer: BC Managed Care – PPO | Admitting: Diagnostic Neuroimaging

## 2014-04-24 ENCOUNTER — Other Ambulatory Visit: Payer: Self-pay | Admitting: Internal Medicine

## 2014-04-24 DIAGNOSIS — R928 Other abnormal and inconclusive findings on diagnostic imaging of breast: Secondary | ICD-10-CM

## 2014-04-26 ENCOUNTER — Ambulatory Visit (INDEPENDENT_AMBULATORY_CARE_PROVIDER_SITE_OTHER): Payer: BC Managed Care – PPO | Admitting: Diagnostic Neuroimaging

## 2014-04-26 ENCOUNTER — Encounter: Payer: Self-pay | Admitting: Diagnostic Neuroimaging

## 2014-04-26 VITALS — BP 131/79 | HR 91 | Ht 65.5 in | Wt 171.4 lb

## 2014-04-26 DIAGNOSIS — G500 Trigeminal neuralgia: Secondary | ICD-10-CM

## 2014-04-26 DIAGNOSIS — R200 Anesthesia of skin: Secondary | ICD-10-CM

## 2014-04-26 MED ORDER — GABAPENTIN 300 MG PO CAPS: 900.0000 mg | ORAL_CAPSULE | Freq: Three times a day (TID) | ORAL | Status: DC

## 2014-04-26 NOTE — Progress Notes (Signed)
[facility]  PATIENT: Christina Hernandez DOB: Nov 16, 1950  REFERRING CLINICIAN: Lockhart HISTORY FROM: patient  REASON FOR VISIT: follow up   HISTORICAL  CHIEF COMPLAINT:  Chief Complaint  Patient presents with  . Follow-up    HISTORY OF PRESENT ILLNESS:   UPDATE 04/26/14: Since last visit, stable. Still with left chin numbness and left jaw pain. On gabapentin 600mg  TID without significant relief. No new symptoms. Has appt with endocrine re: thyroid inflammation/enlargement. Also need parathyroid eval according to dentist, based on calcium levels and bone appearance.   PRIOR HPI (03/13/14): 63 year old right-handed female here for evaluation of left trigeminal neuralgia. January 2014 patient had second and third molar impaction, treated with extraction. Patient did well with the procedure. In May 2015 she developed "heavy feeling" in her left jaw. In 12/31/2013 patient had severe pain in her left lower jaw. She numbness in her left lower teeth, gums, Chin, jaw. Patient was noted to have left submandibular swelling, treated with antibiotics. CT scan showed florid cemento-osseous dysplasia, and formal bone biopsy showed vital bone and no osteomyelitis. Patient was presumed to have left trigeminal neuralgia, mandibular branch, and started on gabapentin 300 mg daily. Since that time swelling in the left abdomen the region has reduced. She still has pain in the left jaw. She has significant numbness, tingling, very sensation in her left chin and left lower lip region.    REVIEW OF SYSTEMS: Full 14 system review of systems performed and notable only for fatigue abd pain numbness freq urination freq waking neck stiff enss snoring moles.    ALLERGIES: No Known Allergies  HOME MEDICATIONS: Outpatient Prescriptions Prior to Visit  Medication Sig Dispense Refill  . ibuprofen (ADVIL,MOTRIN) 200 MG tablet Take 400-600 mg by mouth every 6 (six) hours as needed for moderate pain.       Marland Kitchen levothyroxine (SYNTHROID, LEVOTHROID) 75 MCG tablet Take 75 mcg by mouth daily.        Marland Kitchen losartan-hydrochlorothiazide (HYZAAR) 50-12.5 MG per tablet Take 1 tablet by mouth daily.        . Multiple Vitamin (MULTIVITAMIN WITH MINERALS) TABS tablet Take 1 tablet by mouth daily.      Marland Kitchen PROBIOTIC CAPS Take 1 capsule by mouth daily.        . Saxagliptin-Metformin (KOMBIGLYZE XR) 5-500 MG TB24 Take 1 tablet by mouth daily.       Marland Kitchen gabapentin (NEURONTIN) 300 MG capsule Take 2 capsules (600 mg total) by mouth 3 (three) times daily.  180 capsule  6   No facility-administered medications prior to visit.    PAST MEDICAL HISTORY: Past Medical History  Diagnosis Date  . Hypertension   . Hypothyroidism   . GERD (gastroesophageal reflux disease)     rarely occurs- no meds  . Shortness of breath     pt states sob on exertion because she is "out of shape"  . Diabetes     PAST SURGICAL HISTORY: Past Surgical History  Procedure Laterality Date  . Uterine polyp    . Tubal ligation    . Hysteroscopy w/d&c  05/24/2011    Procedure: DILATATION AND CURETTAGE (D&C) /HYSTEROSCOPY;  Surgeon: [name], MD;  Location: [facility];  Service: Gynecology;  Laterality: N/A;    FAMILY HISTORY: Family History  Problem Relation Age of Onset  . Hypertension Mother   . Heart disease Mother   . Kidney failure Mother   . Heart Problems Father   . Heart Problems Sister   . Cardiomyopathy  Sister   . Ovarian cancer Sister     SOCIAL HISTORY:  History   Social History  . Marital Status: Married    Spouse Name: [name]    Number of Children: 4  . Years of Education: BS   Occupational History  . Retired Other   Social History Main Topics  . Smoking status: Never Smoker   . Smokeless tobacco: Never Used  . Alcohol Use: No  . Drug Use: No  . Sexual Activity: Not on file   Other Topics Concern  . Not on file   Social History Narrative   Patient lives at home with family.   Caffeine Use:  occasionally     PHYSICAL EXAM  Filed Vitals:   04/26/14 1010  BP: 131/79  Pulse: 91  Height: 5' 5.5" (1.664 m)  Weight: 171 lb 6.4 oz (77.747 kg)    Not recorded    Body mass index is 28.08 kg/(m^2).  GENERAL EXAM: Patient is in no distress; well developed, nourished and groomed; neck is supple; ENLARGED THYROID (NONTENDER)  CARDIOVASCULAR: Regular rate and rhythm, no murmurs, no carotid bruits  NEUROLOGIC: MENTAL STATUS: awake, alert, oriented to person, place and time, recent and remote memory intact, normal attention and concentration, language fluent, comprehension intact, naming intact, fund of knowledge appropriate CRANIAL NERVE: no papilledema on fundoscopic exam, pupils equal and reactive to light, visual fields full to confrontation, extraocular muscles intact, no nystagmus, DECR FACIAL SENSATION IN LEFT CHIN; ALLODYNIA IN LEFT V3 DISTRIBUTION. FACIAL STRENGTH SYMM, hearing intact, palate elevates symmetrically, uvula midline, shoulder shrug symmetric, tongue midline. MOTOR: normal bulk and tone, full strength in the BUE, BLE SENSORY: normal and symmetric to light touch, pinprick, temperature, vibration COORDINATION: finger-nose-finger, fine finger movements normal REFLEXES: deep tendon reflexes TRACE and symmetric GAIT/STATION: narrow based gait; able to walk tandem; romberg is negative    DIAGNOSTIC DATA (LABS, IMAGING, TESTING) - I reviewed patient records, labs, notes, testing and imaging myself where available.  Lab Results  Component Value Date   WBC 9.9 01/01/2014   HGB 12.6 01/01/2014   HCT 36.6 01/01/2014   MCV 81.0 01/01/2014   PLT 262 01/01/2014      Component Value Date/Time   NA 136 05/18/2011 1004   K 3.2* 05/18/2011 1004   CL 98 05/18/2011 1004   CO2 26 05/18/2011 1004   GLUCOSE 102* 05/18/2011 1004   BUN 14 05/18/2011 1004   CREATININE 0.65 05/18/2011 1004   CALCIUM 9.7 05/18/2011 1004   GFRNONAA >90 05/18/2011 1004   GFRAA >90 05/18/2011 1004     No results found for this basename: CHOL,  HDL,  LDLCALC,  LDLDIRECT,  TRIG,  CHOLHDL   No results found for this basename: HGBA1C   No results found for this basename: VITAMINB12   No results found for this basename: TSH    I reviewed images myself and agree with interpretation. -VRP  02/2314 MRI brain 1. Few punctate periventricular and subcortical foci of non-specific gliosis. These findings are non-specific and considerations include autoimmune, inflammatory, post-infectious, microvascular ischemic or migraine associated etiologies.  2. No acute findings.  3. Of note, this study was performed without IV contrast, as IV access could not be obtained.     ASSESSMENT AND PLAN  63 y.o. year old female here with left trigeminal nerve pain, in setting of multiple dental procedures in the left mandibular region, and recent presumed infx/abscess treated with antibiotics.   Dx: left trigeminal neuralgia  PLAN: - gabapentin -->  increase up to 900mg  TID  - may consider carbamazepine, amitrtiptyline or baclofen in future - may consider pain clinic referral in future  Meds ordered this encounter  Medications  . gabapentin (NEURONTIN) 300 MG capsule    Sig: Take 3 capsules (900 mg total) by mouth 3 (three) times daily.    Dispense:  270 capsule    Refill:  6   Return in about 3 months (around 07/12/2014).    Penni Bombard, MD 04/24/2535, 64:40 AM Certified in Neurology, Neurophysiology and Neuroimaging  [facility] [address], [place], [place] [address] [phone]

## 2014-04-26 NOTE — Patient Instructions (Signed)
Try to gradually increase gabapentin to 900mg  three times per day.

## 2014-05-08 ENCOUNTER — Ambulatory Visit
Admission: RE | Admit: 2014-05-08 | Discharge: 2014-05-08 | Disposition: A | Discharge status: Home / Self Care | Payer: Medicare Other | Source: Ambulatory Visit | Attending: Internal Medicine | Admitting: Internal Medicine

## 2014-05-08 DIAGNOSIS — R928 Other abnormal and inconclusive findings on diagnostic imaging of breast: Secondary | ICD-10-CM

## 2014-07-04 ENCOUNTER — Other Ambulatory Visit: Payer: Self-pay

## 2014-07-04 MED ORDER — GABAPENTIN 300 MG PO CAPS: 900.0000 mg | ORAL_CAPSULE | Freq: Three times a day (TID) | ORAL | Status: DC

## 2014-07-30 ENCOUNTER — Encounter: Payer: Self-pay | Admitting: Diagnostic Neuroimaging

## 2014-07-30 ENCOUNTER — Ambulatory Visit (INDEPENDENT_AMBULATORY_CARE_PROVIDER_SITE_OTHER): Payer: 59 | Admitting: Diagnostic Neuroimaging

## 2014-07-30 VITALS — BP 133/82 | HR 96 | Temp 98.7°F | Ht 60.0 in | Wt 177.6 lb

## 2014-07-30 DIAGNOSIS — R200 Anesthesia of skin: Secondary | ICD-10-CM

## 2014-07-30 DIAGNOSIS — G500 Trigeminal neuralgia: Secondary | ICD-10-CM

## 2014-07-30 MED ORDER — CARBAMAZEPINE 200 MG PO TABS: 200.0000 mg | ORAL_TABLET | Freq: Two times a day (BID) | ORAL | Status: DC

## 2014-07-30 NOTE — Patient Instructions (Signed)
Continue gabapentin 600mg  three times per day.  Start carbamazepine 200mg  twice a day.

## 2014-07-30 NOTE — Progress Notes (Signed)
[facility]  PATIENT: Christina Hernandez DOB: [birth year]  REFERRING CLINICIAN: Carter HISTORY FROM: patient  REASON FOR VISIT: follow up   HISTORICAL  CHIEF COMPLAINT:  Chief Complaint  Patient presents with  . Follow-up    my lip numbness and pain is not improving     HISTORY OF PRESENT ILLNESS:   UPDATE 07/30/14: Still with left lower lip numbness and burning pain. On gabapentin 600-900mg  TID, with only mild benefit.   UPDATE 04/26/14: Since last visit, stable. Still with left chin numbness and left jaw pain. On gabapentin 600mg  TID without significant relief. No new symptoms. Has appt with endocrine re: thyroid inflammation/enlargement. Also need parathyroid eval according to dentist, based on calcium levels and bone appearance.   PRIOR HPI (03/13/14): 64 year old right-handed female here for evaluation of left trigeminal neuralgia. January 2014 patient had second and third molar impaction, treated with extraction. Patient did well with the procedure. In May 2015 she developed "heavy feeling" in her left jaw. In 12/31/2013 patient had severe pain in her left lower jaw. She numbness in her left lower teeth, gums, Chin, jaw. Patient was noted to have left submandibular swelling, treated with antibiotics. CT scan showed florid cemento-osseous dysplasia, and formal bone biopsy showed vital bone and no osteomyelitis. Patient was presumed to have left trigeminal neuralgia, mandibular branch, and started on gabapentin 300 mg daily. Since that time swelling in the left abdomen the region has reduced. She still has pain in the left jaw. She has significant numbness, tingling, very sensation in her left chin and left lower lip region.    REVIEW OF SYSTEMS: Full 14 system review of systems performed and notable only for fatigue hearing loss drooling snoring numbness.    ALLERGIES: No Known Allergies  HOME MEDICATIONS: Outpatient Prescriptions Prior to Visit  Medication  Sig Dispense Refill  . gabapentin (NEURONTIN) 300 MG capsule Take 3 capsules (900 mg total) by mouth 3 (three) times daily. 540 capsule 1  . ibuprofen (ADVIL,MOTRIN) 200 MG tablet Take 400-600 mg by mouth every 6 (six) hours as needed for moderate pain.    Marland Kitchen levothyroxine (SYNTHROID, LEVOTHROID) 75 MCG tablet Take 75 mcg by mouth daily.      Marland Kitchen losartan-hydrochlorothiazide (HYZAAR) 50-12.5 MG per tablet Take 1 tablet by mouth daily.      . Multiple Vitamin (MULTIVITAMIN WITH MINERALS) TABS tablet Take 1 tablet by mouth daily.    Marland Kitchen PROBIOTIC CAPS Take 1 capsule by mouth daily.      . Saxagliptin-Metformin (KOMBIGLYZE XR) 5-500 MG TB24 Take 1 tablet by mouth daily.      No facility-administered medications prior to visit.    PAST MEDICAL HISTORY: Past Medical History  Diagnosis Date  . Hypertension   . Hypothyroidism   . GERD (gastroesophageal reflux disease)     rarely occurs- no meds  . Shortness of breath     pt states sob on exertion because she is "out of shape"  . Diabetes     PAST SURGICAL HISTORY: Past Surgical History  Procedure Laterality Date  . Uterine polyp    . Tubal ligation    . Hysteroscopy w/d&c  05/24/2011    Procedure: DILATATION AND CURETTAGE (D&C) /HYSTEROSCOPY;  Surgeon: [name], MD;  Location: [facility];  Service: Gynecology;  Laterality: N/A;    FAMILY HISTORY: Family History  Problem Relation Age of Onset  . Hypertension Mother   . Heart disease Mother   . Kidney failure Mother   . Heart  Problems Father   . Heart Problems Sister   . Cardiomyopathy Sister   . Ovarian cancer Sister     SOCIAL HISTORY:  History   Social History  . Marital Status: Married    Spouse Name: [name]    Number of Children: 4  . Years of Education: BS   Occupational History  . Retired Other   Social History Main Topics  . Smoking status: Never Smoker   . Smokeless tobacco: Never Used  . Alcohol Use: No  . Drug Use: No  . Sexual Activity: Not on file    Other Topics Concern  . Not on file   Social History Narrative   Patient lives at home with family.   Caffeine Use: occasionally     PHYSICAL EXAM  Filed Vitals:   07/30/14 1008  BP: 133/82  Pulse: 96  Temp: 98.7 F (37.1 C)  TempSrc: Oral  Height: 5' (1.524 m)  Weight: 177 lb 9.6 oz (80.559 kg)    Not recorded      Body mass index is 34.69 kg/(m^2).  GENERAL EXAM: Patient is in no distress; well developed, nourished and groomed; neck is supple; ENLARGED THYROID (NONTENDER)  CARDIOVASCULAR: Regular rate and rhythm, no murmurs, no carotid bruits  NEUROLOGIC: MENTAL STATUS: awake, alert, language fluent, comprehension intact, naming intact, fund of knowledge appropriate CRANIAL NERVE: pupils equal and reactive to light, visual fields full to confrontation, extraocular muscles intact, no nystagmus, DECR FACIAL SENSATION IN LEFT CHIN; ALLODYNIA IN LEFT V3 DISTRIBUTION. FACIAL STRENGTH SYMM, hearing intact, palate elevates symmetrically, uvula midline, shoulder shrug symmetric, tongue midline. MOTOR: normal bulk and tone, full strength in the BUE, BLE SENSORY: normal and symmetric to light touch, pinprick COORDINATION: finger-nose-finger, fine finger movements normal REFLEXES: deep tendon reflexes TRACE and symmetric GAIT/STATION: narrow based gait    DIAGNOSTIC DATA (LABS, IMAGING, TESTING) - I reviewed patient records, labs, notes, testing and imaging myself where available.  Lab Results  Component Value Date   WBC 9.9 01/01/2014   HGB 12.6 01/01/2014   HCT 36.6 01/01/2014   MCV 81.0 01/01/2014   PLT 262 01/01/2014      Component Value Date/Time   NA 136 05/18/2011 1004   K 3.2* 05/18/2011 1004   CL 98 05/18/2011 1004   CO2 26 05/18/2011 1004   GLUCOSE 102* 05/18/2011 1004   BUN 14 05/18/2011 1004   CREATININE 0.65 05/18/2011 1004   CALCIUM 9.7 05/18/2011 1004   GFRNONAA >90 05/18/2011 1004   GFRAA >90 05/18/2011 1004   No results found for:  CHOL No results found for: HGBA1C No results found for: VITAMINB12 No results found for: TSH  I reviewed images myself and agree with interpretation. -VRP  02/2314 MRI brain 1. Few punctate periventricular and subcortical foci of non-specific gliosis. These findings are non-specific and considerations include autoimmune, inflammatory, post-infectious, microvascular ischemic or migraine associated etiologies.  2. No acute findings.  3. Of note, this study was performed without IV contrast, as IV access could not be obtained.    ASSESSMENT AND PLAN  64 y.o. year old female here with left trigeminal nerve pain, in setting of multiple dental procedures in the left mandibular region, and recent presumed infx/abscess treated with antibiotics.   Dx: left trigeminal neuralgia  PLAN: - continue gabapentin 600-900mg  TID  - start carbamazepine  - may consider pain clinic referral in future  Meds ordered this encounter  Medications  . carbamazepine (TEGRETOL) 200 MG tablet    Sig: Take 1  tablet (200 mg total) by mouth 2 (two) times daily.    Dispense:  60 tablet    Refill:  6   Return in about 3 months (around 10/28/2014).    Penni Bombard, MD 08/02/2351, 61:44 AM Certified in Neurology, Neurophysiology and Neuroimaging  [facility] [address], [place], [place] [address] [phone]

## 2014-10-29 ENCOUNTER — Ambulatory Visit (INDEPENDENT_AMBULATORY_CARE_PROVIDER_SITE_OTHER): Payer: Medicare Other | Admitting: Physician Assistant

## 2014-10-29 VITALS — BP 154/82 | HR 103 | Temp 98.6°F | Resp 16 | Ht 60.0 in | Wt 177.0 lb

## 2014-10-29 DIAGNOSIS — B9789 Other viral agents as the cause of diseases classified elsewhere: Principal | ICD-10-CM

## 2014-10-29 DIAGNOSIS — H6123 Impacted cerumen, bilateral: Secondary | ICD-10-CM

## 2014-10-29 DIAGNOSIS — J069 Acute upper respiratory infection, unspecified: Secondary | ICD-10-CM

## 2014-10-29 LAB — POCT INFLUENZA A/B
Influenza A, POC: NEGATIVE
Influenza B, POC: NEGATIVE

## 2014-10-29 MED ORDER — HYDROCODONE-HOMATROPINE 5-1.5 MG/5ML PO SYRP
5.0000 mL | ORAL_SOLUTION | Freq: Three times a day (TID) | ORAL | Status: DC | PRN
Start: 2014-10-29 — End: 2015-06-18

## 2014-10-29 MED ORDER — CETIRIZINE HCL 10 MG PO TABS: 10.0000 mg | ORAL_TABLET | Freq: Every day | ORAL | Status: DC

## 2014-10-29 MED ORDER — ACETAMINOPHEN 500 MG PO TABS: 1000.0000 mg | ORAL_TABLET | Freq: Four times a day (QID) | ORAL | Status: DC | PRN

## 2014-10-29 MED ORDER — NAPROXEN SODIUM 220 MG PO TABS: 220.0000 mg | ORAL_TABLET | Freq: Two times a day (BID) | ORAL | Status: DC

## 2014-10-29 NOTE — Progress Notes (Signed)
10/29/2014 at 4:24 PM  Korinne Lowella Dell / DOB: [birth year] / MRN: [id number]  The patient has Left-sided trigeminal neuralgia and Left jaw numbness on her problem list.  SUBJECTIVE  Chief complaint: Cough; Sore Throat; Headache; and Nasal Congestion  Neima LIDDIE CHICHESTER is a 64 y.o. female complaining of nasal blockage, post nasal drip, sinus and nasal congestion and sore throat that started 4 days ago.  Associated symptoms include cough and headache today, and she denies fever and jaw pain.The patient symptoms show no change. Treatments tried thus far include acetaminophen with fair  relief. She reports sick contacts.   She  has a past medical history of Hypertension; Hypothyroidism; GERD (gastroesophageal reflux disease); Shortness of breath; and Diabetes.    Medications reviewed and updated by myself where necessary, and exist elsewhere in the encounter.   Ms. Rickles has No Known Allergies. She  reports that she has never smoked. She has never used smokeless tobacco. She reports that she does not drink alcohol or use illicit drugs. She  has no sexual activity history on file. The patient  has past surgical history that includes uterine polyp; Tubal ligation; and Hysteroscopy w/D&C (05/24/2011).  Her family history includes Cardiomyopathy in her sister; Heart Problems in her father and sister; Heart disease in her mother; Hypertension in her mother; Kidney failure in her mother; Ovarian cancer in her sister.  Review of Systems  Constitutional: Negative for fever and chills.  HENT: Positive for congestion and sore throat.   Eyes: Negative.   Respiratory: Positive for cough. Negative for shortness of breath and wheezing.   Cardiovascular: Negative for chest pain and palpitations.  Gastrointestinal: Negative for heartburn, nausea, vomiting and abdominal pain.  Genitourinary: Negative.   Neurological: Positive for headaches. Negative for dizziness.    OBJECTIVE  Her  height is 5' (1.524  m) and weight is 177 lb (80.287 kg). Her oral temperature is 98.6 F (37 C). Her blood pressure is 154/82 and her pulse is 103. Her respiration is 16 and oxygen saturation is 95%.  The patient's body mass index is 34.57 kg/(m^2).  Physical Exam  Constitutional: She is oriented to person, place, and time. She appears well-developed and well-nourished. No distress.  HENT:  Right Ear: Hearing, tympanic membrane, external ear and ear canal normal.  Left Ear: Hearing, tympanic membrane, external ear and ear canal normal.  Nose: Mucosal edema present. Right sinus exhibits no maxillary sinus tenderness and no frontal sinus tenderness. Left sinus exhibits no maxillary sinus tenderness and no frontal sinus tenderness.  Mouth/Throat: Uvula is midline, oropharynx is clear and moist and mucous membranes are normal.  Cardiovascular: Normal rate, regular rhythm and normal heart sounds.   Respiratory: Effort normal and breath sounds normal. She has no wheezes. She has no rales.  Neurological: She is alert and oriented to person, place, and time.  Skin: Skin is warm and dry. She is not diaphoretic.  Psychiatric: She has a normal mood and affect.    Results for orders placed or performed in visit on 10/29/14 (from the past 24 hour(s))  POCT Influenza A/B     Status: None   Collection Time: 10/29/14  4:00 PM  Result Value Ref Range   Influenza A, POC Negative    Influenza B, POC Negative     ASSESSMENT & PLAN  Davanee was seen today for cough, sore throat, headache and nasal congestion.  Diagnoses and all orders for this visit:  Viral URI with cough Orders: -  POCT Influenza A/B -     HYDROcodone-homatropine (HYCODAN) 5-1.5 MG/5ML syrup; Take 5 mLs by mouth every 8 (eight) hours as needed for cough. -     acetaminophen (TYLENOL) 500 MG tablet; Take 2 tablets (1,000 mg total) by mouth every 6 (six) hours as needed. -     naproxen sodium (ALEVE) 220 MG tablet; Take 1 tablet (220 mg total) by mouth  2 (two) times daily with a meal. -     cetirizine (ZYRTEC) 10 MG tablet; Take 1 tablet (10 mg total) by mouth daily.  Cerumen impaction, bilateral Orders: -     Ear wax removal    The patient was advised to call or come back to clinic if she does not see an improvement in symptoms, or worsens with the above plan.   Philis Fendt, MHS, PA-C [facility] 10/29/2014 4:24 PM

## 2014-11-06 ENCOUNTER — Ambulatory Visit: Payer: Self-pay | Admitting: Diagnostic Neuroimaging

## 2014-11-08 ENCOUNTER — Ambulatory Visit: Payer: Medicare Other | Admitting: Diagnostic Neuroimaging

## 2014-11-18 ENCOUNTER — Other Ambulatory Visit: Payer: Self-pay | Admitting: Family Medicine

## 2014-11-18 ENCOUNTER — Ambulatory Visit (INDEPENDENT_AMBULATORY_CARE_PROVIDER_SITE_OTHER): Payer: Medicare Other

## 2014-11-18 ENCOUNTER — Ambulatory Visit (INDEPENDENT_AMBULATORY_CARE_PROVIDER_SITE_OTHER): Payer: Medicare Other | Admitting: Family Medicine

## 2014-11-18 VITALS — BP 114/70 | HR 88 | Temp 97.9°F | Resp 18 | Ht 64.25 in | Wt 180.0 lb

## 2014-11-18 DIAGNOSIS — M25461 Effusion, right knee: Secondary | ICD-10-CM | POA: Diagnosis not present

## 2014-11-18 DIAGNOSIS — M25561 Pain in right knee: Secondary | ICD-10-CM | POA: Diagnosis not present

## 2014-11-18 MED ORDER — PREDNISONE 20 MG PO TABS: ORAL_TABLET | ORAL | Status: DC

## 2014-11-18 NOTE — Patient Instructions (Signed)
You have a mild arthritis in the right knee. This should respond very quickly to cortisone. Be aware the cortisone can elevate your blood sugar temporarily. Make sure you take your diabetes medicine. If your pain is gone in 3 days, you can stop the cortisone before the entire prescription has been taken.

## 2014-11-18 NOTE — Progress Notes (Signed)
° °  Subjective:  This chart was scribed for Robyn Haber, MD by Moises Blood, Medical Scribe. This patient was seen in room 7 and the patient's care was started 10:42 AM.    Patient ID: Christina Hernandez, female    DOB: 14-Aug-1950, 64 y.o.   MRN: [id number]  HPI Christina Hernandez is a 64 y.o. female who presents to [facility] complaining of gradual onset right knee pain that started 2 weeks ago. She reports that it slowly worsened over time. It tightens up when sitting down for a long time. And then, it makes hard to ambulate due to stiffness. She took ibuprofen for it and she had some relief. She has HTN, and mild DM.  She mentions Dr. Baird Cancer is her PCP.    She substitutes for schools.    Review of Systems  Musculoskeletal: Positive for arthralgias (right knee).       Objective:   Physical Exam  Patient's right knee shows moderate synovial thickening. She has tenderness in the medial joint line. Ligaments are intact. There is no effusion. There is no bony abnormality seen grossly.  Patient has no Pain or edema Overlying skin is without erythema or ecchymosis.  [facility] reading (PRIMARY) by  Dr. Joseph Art right knee shows decreased joint space, no linear calcium lines and the cartilage, and no significant effusion.        Assessment & Plan:   This chart was scribed in my presence and reviewed by me personally.    ICD-9-CM ICD-10-CM   1. Right knee pain 719.46 M25.561 DG Knee Complete 4 Views Right     predniSONE (DELTASONE) 20 MG tablet  2. Knee swelling, right 719.06 M25.461 DG Knee Complete 4 Views Right     predniSONE (DELTASONE) 20 MG tablet     Signed, Robyn Haber, MD

## 2014-11-26 ENCOUNTER — Ambulatory Visit (INDEPENDENT_AMBULATORY_CARE_PROVIDER_SITE_OTHER): Payer: Medicare Other | Admitting: Diagnostic Neuroimaging

## 2014-11-26 ENCOUNTER — Encounter: Payer: Self-pay | Admitting: Diagnostic Neuroimaging

## 2014-11-26 VITALS — BP 140/78 | HR 109 | Ht 64.0 in | Wt 180.6 lb

## 2014-11-26 DIAGNOSIS — G500 Trigeminal neuralgia: Secondary | ICD-10-CM

## 2014-11-26 MED ORDER — PREGABALIN 50 MG PO CAPS: 50.0000 mg | ORAL_CAPSULE | Freq: Three times a day (TID) | ORAL | Status: DC

## 2014-11-26 NOTE — Progress Notes (Signed)
[facility]  PATIENT: Christina Hernandez DOB: April 15, 1951  REFERRING CLINICIAN: [place] HISTORY FROM: patient  REASON FOR VISIT: follow up   HISTORICAL  CHIEF COMPLAINT:  Chief Complaint  Patient presents with  . Follow-up    left sided trigeminal neuralgia; the pain has not gotten any better      HISTORY OF PRESENT ILLNESS:   UPDATE 11/26/14: Since last visit, sxs have continued. Tried carbamazepine x 2 months without relief. Now off CBZ. Still on gabapentin. More depression and fatigue. Pain is 7/10 in severity.   UPDATE 07/30/14: Still with left lower lip numbness and burning pain. On gabapentin 600-900mg  TID, with only mild benefit.   UPDATE 04/26/14: Since last visit, stable. Still with left chin numbness and left jaw pain. On gabapentin 600mg  TID without significant relief. No new symptoms. Has appt with endocrine re: thyroid inflammation/enlargement. Also need parathyroid eval according to dentist, based on calcium levels and bone appearance.   PRIOR HPI (03/13/14): 64 year old right-handed female here for evaluation of left trigeminal neuralgia. January 2014 patient had second and third molar impaction, treated with extraction. Patient did well with the procedure. In May 2015 she developed "heavy feeling" in her left jaw. In 12/31/2013 patient had severe pain in her left lower jaw. She numbness in her left lower teeth, gums, Chin, jaw. Patient was noted to have left submandibular swelling, treated with antibiotics. CT scan showed florid cemento-osseous dysplasia, and formal bone biopsy showed vital bone and no osteomyelitis. Patient was presumed to have left trigeminal neuralgia, mandibular branch, and started on gabapentin 300 mg daily. Since that time swelling in the left abdomen the region has reduced. She still has pain in the left jaw. She has significant numbness, tingling, very sensation in her left chin and left lower lip region.    REVIEW OF SYSTEMS: Full  14 system review of systems performed and notable only for fatigue hearing loss drooling snoring numbness.    ALLERGIES: No Known Allergies  HOME MEDICATIONS: Outpatient Prescriptions Prior to Visit  Medication Sig Dispense Refill  . acetaminophen (TYLENOL) 500 MG tablet Take 2 tablets (1,000 mg total) by mouth every 6 (six) hours as needed. 30 tablet 0  . cetirizine (ZYRTEC) 10 MG tablet Take 1 tablet (10 mg total) by mouth daily. 30 tablet 3  . chlorhexidine (PERIDEX) 0.12 % solution   12  . gabapentin (NEURONTIN) 300 MG capsule Take 3 capsules (900 mg total) by mouth 3 (three) times daily. 540 capsule 1  . HYDROcodone-homatropine (HYCODAN) 5-1.5 MG/5ML syrup Take 5 mLs by mouth every 8 (eight) hours as needed for cough. 120 mL 0  . ibuprofen (ADVIL,MOTRIN) 200 MG tablet Take 400-600 mg by mouth every 6 (six) hours as needed for moderate pain.    Marland Kitchen levothyroxine (SYNTHROID, LEVOTHROID) 75 MCG tablet Take 75 mcg by mouth daily.      Marland Kitchen losartan-hydrochlorothiazide (HYZAAR) 50-12.5 MG per tablet Take 1 tablet by mouth daily.      . Multiple Vitamin (MULTIVITAMIN WITH MINERALS) TABS tablet Take 1 tablet by mouth daily.    . naproxen sodium (ALEVE) 220 MG tablet Take 1 tablet (220 mg total) by mouth 2 (two) times daily with a meal. 14 tablet 0  . pravastatin (PRAVACHOL) 40 MG tablet Take 40 mg by mouth daily.    . predniSONE (DELTASONE) 20 MG tablet Two daily with food 10 tablet 0  . PROBIOTIC CAPS Take 1 capsule by mouth daily.      . Saxagliptin-Metformin [facility]  XR) 5-500 MG TB24 Take 1 tablet by mouth daily.      No facility-administered medications prior to visit.    PAST MEDICAL HISTORY: Past Medical History  Diagnosis Date  . Hypertension   . Hypothyroidism   . GERD (gastroesophageal reflux disease)     rarely occurs- no meds  . Shortness of breath     pt states sob on exertion because she is "out of shape"  . Diabetes     PAST SURGICAL HISTORY: Past Surgical History    Procedure Laterality Date  . Uterine polyp    . Tubal ligation    . Hysteroscopy w/d&c  05/24/2011    Procedure: DILATATION AND CURETTAGE (D&C) /HYSTEROSCOPY;  Surgeon: [name], MD;  Location: [facility];  Service: Gynecology;  Laterality: N/A;    FAMILY HISTORY: Family History  Problem Relation Age of Onset  . Hypertension Mother   . Heart disease Mother   . Kidney failure Mother   . Heart Problems Father   . Heart Problems Sister   . Cardiomyopathy Sister   . Ovarian cancer Sister     SOCIAL HISTORY:  History   Social History  . Marital Status: Married    Spouse Name: [name]  . Number of Children: 4  . Years of Education: BS   Occupational History  . Retired Other   Social History Main Topics  . Smoking status: Never Smoker   . Smokeless tobacco: Never Used  . Alcohol Use: No  . Drug Use: No  . Sexual Activity: Not on file   Other Topics Concern  . Not on file   Social History Narrative   Patient lives at home with family.   Caffeine Use: occasionally     PHYSICAL EXAM  Filed Vitals:   11/26/14 1434  BP: 140/78  Pulse: 109  Height: 5\' 4"  (1.626 m)  Weight: 180 lb 9.6 oz (81.92 kg)    Not recorded      Body mass index is 30.98 kg/(m^2).  GENERAL EXAM: Patient is in no distress; well developed, nourished and groomed; neck is supple; ENLARGED THYROID (NONTENDER)  CARDIOVASCULAR: Regular rate and rhythm, no murmurs, no carotid bruits  NEUROLOGIC: MENTAL STATUS: awake, alert, language fluent, comprehension intact, naming intact, fund of knowledge appropriate CRANIAL NERVE: pupils equal and reactive to light, visual fields full to confrontation, extraocular muscles intact, no nystagmus, DECR FACIAL SENSATION IN LEFT CHIN; ALLODYNIA IN LEFT V3 DISTRIBUTION. FACIAL STRENGTH SYMM, hearing intact, palate elevates symmetrically, uvula midline, shoulder shrug symmetric, tongue midline. MOTOR: normal bulk and tone, full strength in the BUE, BLE SENSORY:  normal and symmetric to light touch, pinprick COORDINATION: finger-nose-finger, fine finger movements normal REFLEXES: deep tendon reflexes TRACE and symmetric GAIT/STATION: narrow based gait    DIAGNOSTIC DATA (LABS, IMAGING, TESTING) - I reviewed patient records, labs, notes, testing and imaging myself where available.  Lab Results  Component Value Date   WBC 9.9 01/01/2014   HGB 12.6 01/01/2014   HCT 36.6 01/01/2014   MCV 81.0 01/01/2014   PLT 262 01/01/2014      Component Value Date/Time   NA 136 05/18/2011 1004   K 3.2* 05/18/2011 1004   CL 98 05/18/2011 1004   CO2 26 05/18/2011 1004   GLUCOSE 102* 05/18/2011 1004   BUN 14 05/18/2011 1004   CREATININE 0.65 05/18/2011 1004   CALCIUM 9.7 05/18/2011 1004   GFRNONAA >90 05/18/2011 1004   GFRAA >90 05/18/2011 1004   No results found for: CHOL No  results found for: HGBA1C No results found for: VITAMINB12 No results found for: TSH  I reviewed images myself and agree with interpretation. -VRP  02/2314 MRI brain 1. Few punctate periventricular and subcortical foci of non-specific gliosis. These findings are non-specific and considerations include autoimmune, inflammatory, post-infectious, microvascular ischemic or migraine associated etiologies.  2. No acute findings.  3. Of note, this study was performed without IV contrast, as IV access could not be obtained.    ASSESSMENT AND PLAN  64 y.o. year old female here with left trigeminal nerve pain, in setting of multiple dental procedures in the left mandibular region and presumed infx/abscess treated with antibiotics.   Dx: left trigeminal neuralgia  PLAN: - continue gabapentin 600-900mg  TID  - start lyrica 50mg  TID - may reduce gabapentin over time - may consider cymbalta in future - may consider pain clinic referral in future - discussed referral for gammaknife, but patient declines at this time  Meds ordered this encounter  Medications  . pregabalin (LYRICA)  50 MG capsule    Sig: Take 1 capsule (50 mg total) by mouth 3 (three) times daily.    Dispense:  90 capsule    Refill:  5   Return in about 3 months (around 02/26/2015).    Penni Bombard, MD 6/96/2952, 8:41 PM Certified in Neurology, Neurophysiology and Neuroimaging  [facility] [address], [place], [place] [address] [phone]

## 2014-11-26 NOTE — Patient Instructions (Signed)
Continue gabapentin.  Add lyrica 50mg  three times per day.  If you have too much sleepiness / fatigue, then reduce gabapentin to 600mg  three times per day.

## 2015-01-15 ENCOUNTER — Other Ambulatory Visit: Payer: Self-pay | Admitting: Internal Medicine

## 2015-01-15 DIAGNOSIS — N6320 Unspecified lump in the left breast, unspecified quadrant: Secondary | ICD-10-CM

## 2015-01-17 ENCOUNTER — Ambulatory Visit (INDEPENDENT_AMBULATORY_CARE_PROVIDER_SITE_OTHER): Payer: Medicare Other | Admitting: Family Medicine

## 2015-01-17 VITALS — BP 108/68 | HR 90 | Temp 98.5°F | Resp 18 | Ht 64.25 in | Wt 180.0 lb

## 2015-01-17 DIAGNOSIS — M25561 Pain in right knee: Secondary | ICD-10-CM

## 2015-01-17 MED ORDER — DICLOFENAC SODIUM 1 % TD GEL: 4.0000 g | Freq: Four times a day (QID) | TRANSDERMAL | Status: DC | PRN

## 2015-01-17 NOTE — Progress Notes (Signed)
Subjective:  This chart was scribed for Christina Ray, MD by [facility], medical scribe at [facility].The patient was seen in exam room 03 and the patient's care was started at 3:18 PM.   Patient ID: Christina Hernandez, female    DOB: [birth year], 64 y.o.   MRN: [id number] Chief Complaint  Patient presents with  . Follow-up    Rt knee pain   HPI HPI Comments: Christina Hernandez is a 64 y.o. female who presents to [facility] for a follow up for right knee pain.   Seen for right knee pain by Dr. Joseph Art  on 11/18/2014 gradual onset two weeks prior but had some tightening  in the knee when sitting for a long time. X-Hernandez showed minimal arthritic changes, with a tiny spurt of the medial tibial plateau. She was treated with prednisone 40 mg daily for five days. Prednisone was helpful for a short period. Tried going to the gym which she thought would help, but her pain is still present. No known trauma or injury.  Today, the same area is bothering her but she cannot pinpoint the area. She has noticed some swelling of the right knee. Her knee will lock up. No recent injections, or physical therapy. She takes either aleve or ibuprofen, which do not help too much.  Review of Systems  Musculoskeletal: Positive for joint swelling and arthralgias.     Objective:  BP 108/68 mmHg  Pulse 90  Temp(Src) 98.5 F (36.9 C) (Oral)  Resp 18  Ht 5' 4.25" (1.632 m)  Wt 180 lb (81.647 kg)  BMI 30.65 kg/m2  SpO2 98% Physical Exam  Constitutional: She is oriented to person, place, and time. She appears well-developed and well-nourished. No distress.  HENT:  Head: Normocephalic and atraumatic.  Eyes: Pupils are equal, round, and reactive to light.  Neck: Normal range of motion.  Cardiovascular: Normal rate and regular rhythm.   Pulmonary/Chest: Effort normal. No respiratory distress.  Musculoskeletal: Normal range of motion.  Right knee: Patella is non tender.  Minimal to no effusion appreciated. No erythema or difference in warmth from the other knee. Full ROM. No focal bony tenderness. Negative varus, and valgus testing. Negative Lachman's. Negative McMurray's.    Neurological: She is alert and oriented to person, place, and time.  Skin: Skin is warm and dry.  Psychiatric: She has a normal mood and affect. Her behavior is normal.  Nursing note and vitals reviewed.     Assessment & Plan:   Christina Hernandez is a 64 y.o. female Recurrent knee pain, right - Plan: Ambulatory referral to Orthopedic Surgery, diclofenac sodium (VOLTAREN) 1 % GEL   Mild degenerative joint disease on previous x-Hernandez. Long-standing pain, suspected degenerative disease , but reports of intermittent locking.   This could indicate some degenerative meniscus disease.  Mminimal relief with previous prednisone 5 day dosing.  -refer to ortho but may benefit from physical therapy vs further imaging/MRI.   -Changed from oral NSAID to topical diclofenac with her history of diabetes and hypertension, but still discussed only use as needed as still some cardiac risks with this medication.    - RTC precautions.  Meds ordered this encounter  Medications  . diclofenac sodium (VOLTAREN) 1 % GEL    Sig: Apply 4 g topically 4 (four) times daily as needed. To right knee    Dispense:  100 g    Refill:  1   Patient Instructions  I will refer you to orthopedist. In the meantime he can apply the anti-inflammatory gel up to 4 times per day to just the right knee. This is less likely to cause some of the issues of the anti-inflammatories by mouth, but still use only as needed.Return to the clinic or go to the nearest emergency room if any of your symptoms worsen or new symptoms occur.     I personally performed the services described in this documentation, which was scribed in my presence. The recorded information has been reviewed and considered, and addended by me as needed.

## 2015-01-17 NOTE — Patient Instructions (Signed)
I will refer you to orthopedist. In the meantime he can apply the anti-inflammatory gel up to 4 times per day to just the right knee. This is less likely to cause some of the issues of the anti-inflammatories by mouth, but still use only as needed.Return to the clinic or go to the nearest emergency room if any of your symptoms worsen or new symptoms occur.

## 2015-01-21 ENCOUNTER — Telehealth: Payer: Self-pay

## 2015-01-21 NOTE — Telephone Encounter (Signed)
PA approved for diclofenac gel through 01/21/16 to be used for R knee pain (d/t OA). Pt has tried ibuprofen and naproxen. Notified pharm.

## 2015-01-23 ENCOUNTER — Ambulatory Visit
Admission: RE | Admit: 2015-01-23 | Discharge: 2015-01-23 | Disposition: A | Discharge status: Home / Self Care | Payer: Medicare Other | Source: Ambulatory Visit | Attending: Internal Medicine | Admitting: Internal Medicine

## 2015-01-23 DIAGNOSIS — N6320 Unspecified lump in the left breast, unspecified quadrant: Secondary | ICD-10-CM

## 2015-01-27 ENCOUNTER — Telehealth: Payer: Self-pay

## 2015-01-27 NOTE — Telephone Encounter (Signed)
duplicate

## 2015-02-06 ENCOUNTER — Other Ambulatory Visit: Payer: Self-pay | Admitting: Diagnostic Neuroimaging

## 2015-02-26 ENCOUNTER — Ambulatory Visit: Payer: Medicare Other | Admitting: Diagnostic Neuroimaging

## 2015-02-27 ENCOUNTER — Other Ambulatory Visit: Payer: Self-pay | Admitting: Obstetrics and Gynecology

## 2015-06-10 HISTORY — PX: KNEE SURGERY: SHX244

## 2015-06-18 ENCOUNTER — Ambulatory Visit (INDEPENDENT_AMBULATORY_CARE_PROVIDER_SITE_OTHER): Payer: Medicare Other | Admitting: Diagnostic Neuroimaging

## 2015-06-18 ENCOUNTER — Encounter: Payer: Self-pay | Admitting: Diagnostic Neuroimaging

## 2015-06-18 VITALS — BP 133/76 | HR 88 | Ht 64.0 in | Wt 179.0 lb

## 2015-06-18 DIAGNOSIS — F329 Major depressive disorder, single episode, unspecified: Secondary | ICD-10-CM | POA: Diagnosis not present

## 2015-06-18 DIAGNOSIS — F32A Depression, unspecified: Secondary | ICD-10-CM

## 2015-06-18 MED ORDER — DULOXETINE HCL 30 MG PO CPEP: 30.0000 mg | ORAL_CAPSULE | Freq: Every day | ORAL | Status: DC

## 2015-06-18 NOTE — Progress Notes (Signed)
[facility]  PATIENT: Christina Hernandez DOB: [birth year]  REFERRING CLINICIAN:  HISTORY FROM: patient  REASON FOR VISIT: follow up   HISTORICAL  CHIEF COMPLAINT:  Chief Complaint  Patient presents with  . left sided trigeminal neuralgia    rm 6, "stable sensations, pain in left chin"  . Follow-up    7 month    HISTORY OF PRESENT ILLNESS:   UPDATE 06/18/15: Since last visit, tried lyrica --> caused side effect of sleepiness. Now on gabapentin 900mg  TID. More depression as a result of left face pain (6/10 severity).   UPDATE 11/26/14: Since last visit, sxs have continued. Tried carbamazepine x 2 months without relief. Now off CBZ. Still on gabapentin. More depression and fatigue. Pain is 7/10 in severity.   UPDATE 07/30/14: Still with left lower lip numbness and burning pain. On gabapentin 600-900mg  TID, with only mild benefit.   UPDATE 04/26/14: Since last visit, stable. Still with left chin numbness and left jaw pain. On gabapentin 600mg  TID without significant relief. No new symptoms. Has appt with endocrine re: thyroid inflammation/enlargement. Also need parathyroid eval according to dentist, based on calcium levels and bone appearance.   PRIOR HPI (03/13/14): 64 year old right-handed female here for evaluation of left trigeminal neuralgia. January 2014 patient had second and third molar impaction, treated with extraction. Patient did well with the procedure. In May 2015 she developed "heavy feeling" in her left jaw. In 12/31/2013 patient had severe pain in her left lower jaw. She numbness in her left lower teeth, gums, Chin, jaw. Patient was noted to have left submandibular swelling, treated with antibiotics. CT scan showed florid cemento-osseous dysplasia, and formal bone biopsy showed vital bone and no osteomyelitis. Patient was presumed to have left trigeminal neuralgia, mandibular branch, and started on gabapentin 300 mg daily. Since that time swelling in the  left abdomen the region has reduced. She still has pain in the left jaw. She has significant numbness, tingling, very sensation in her left chin and left lower lip region.    REVIEW OF SYSTEMS: Full 14 system review of systems performed and notable only for joint swelling moles itching.    ALLERGIES: No Known Allergies  HOME MEDICATIONS: Outpatient Prescriptions Prior to Visit  Medication Sig Dispense Refill  . acetaminophen (TYLENOL) 500 MG tablet Take 2 tablets (1,000 mg total) by mouth every 6 (six) hours as needed. 30 tablet 0  . cetirizine (ZYRTEC) 10 MG tablet Take 1 tablet (10 mg total) by mouth daily. 30 tablet 3  . chlorhexidine (PERIDEX) 0.12 % solution   12  . gabapentin (NEURONTIN) 300 MG capsule TAKE 3 CAPSULES (900 MG TOTAL) BY MOUTH 3 (THREE) TIMES DAILY. 540 capsule 1  . ibuprofen (ADVIL,MOTRIN) 200 MG tablet Take 400-600 mg by mouth every 6 (six) hours as needed for moderate pain.    Marland Kitchen levothyroxine (SYNTHROID, LEVOTHROID) 75 MCG tablet Take 75 mcg by mouth daily.      Marland Kitchen losartan-hydrochlorothiazide (HYZAAR) 50-12.5 MG per tablet Take 1 tablet by mouth daily.      . Multiple Vitamin (MULTIVITAMIN WITH MINERALS) TABS tablet Take 1 tablet by mouth daily.    . pravastatin (PRAVACHOL) 40 MG tablet Take 40 mg by mouth daily.    . Saxagliptin-Metformin (KOMBIGLYZE XR) 5-500 MG TB24 Take 1 tablet by mouth daily.     . diclofenac sodium (VOLTAREN) 1 % GEL Apply 4 g topically 4 (four) times daily as needed. To right knee 100 g 1  . HYDROcodone-homatropine (HYCODAN) 5-1.5  MG/5ML syrup Take 5 mLs by mouth every 8 (eight) hours as needed for cough. 120 mL 0  . pregabalin (LYRICA) 50 MG capsule Take 1 capsule (50 mg total) by mouth 3 (three) times daily. 90 capsule 5  . PROBIOTIC CAPS Take 1 capsule by mouth daily.       No facility-administered medications prior to visit.    PAST MEDICAL HISTORY: Past Medical History  Diagnosis Date  . Hypertension   . Hypothyroidism   . GERD  (gastroesophageal reflux disease)     rarely occurs- no meds  . Shortness of breath     pt states sob on exertion because she is "out of shape"  . Diabetes ([place])     PAST SURGICAL HISTORY: Past Surgical History  Procedure Laterality Date  . Uterine polyp    . Tubal ligation    . Hysteroscopy w/d&c  05/24/2011    Procedure: DILATATION AND CURETTAGE (D&C) /HYSTEROSCOPY;  Surgeon: [name], MD;  Location: [facility];  Service: Gynecology;  Laterality: N/A;  . Knee surgery Right 06/10/15    torn ligament    FAMILY HISTORY: Family History  Problem Relation Age of Onset  . Hypertension Mother   . Heart disease Mother   . Kidney failure Mother   . Heart Problems Father   . Heart Problems Sister   . Cardiomyopathy Sister   . Ovarian cancer Sister     SOCIAL HISTORY:  Social History   Social History  . Marital Status: Married    Spouse Name: [name]  . Number of Children: 4  . Years of Education: BS   Occupational History  . Retired Other   Social History Main Topics  . Smoking status: Never Smoker   . Smokeless tobacco: Never Used  . Alcohol Use: No  . Drug Use: No  . Sexual Activity: Not on file   Other Topics Concern  . Not on file   Social History Narrative   Patient lives at home with family.   Caffeine Use: occasionally     PHYSICAL EXAM  Filed Vitals:   06/18/15 1042  BP: 133/76  Pulse: 88  Height: 5\' 4"  (1.626 m)  Weight: 179 lb (81.194 kg)    Not recorded      Body mass index is 30.71 kg/(m^2).  GENERAL EXAM: Patient is in no distress; well developed, nourished and groomed; neck is supple; ENLARGED THYROID (NONTENDER)  CARDIOVASCULAR: Regular rate and rhythm, no murmurs, no carotid bruits  NEUROLOGIC: MENTAL STATUS: awake, alert, language fluent, comprehension intact, naming intact, fund of knowledge appropriate CRANIAL NERVE: pupils equal and reactive to light, visual fields full to confrontation, extraocular muscles intact, no  nystagmus, DECR FACIAL SENSATION IN LEFT CHIN; ALLODYNIA IN LEFT V3 DISTRIBUTION. FACIAL STRENGTH SYMM, hearing intact, palate elevates symmetrically, uvula midline, shoulder shrug symmetric, tongue midline. MOTOR: normal bulk and tone, full strength in the BUE, BLE SENSORY: normal and symmetric to light touch, pinprick COORDINATION: finger-nose-finger, fine finger movements normal REFLEXES: deep tendon reflexes TRACE and symmetric GAIT/STATION: narrow based gait; LIMPS ON RIGHT KNEE.    DIAGNOSTIC DATA (LABS, IMAGING, TESTING) - I reviewed patient records, labs, notes, testing and imaging myself where available.  Lab Results  Component Value Date   WBC 9.9 01/01/2014   HGB 12.6 01/01/2014   HCT 36.6 01/01/2014   MCV 81.0 01/01/2014   PLT 262 01/01/2014      Component Value Date/Time   NA 136 05/18/2011 1004   K 3.2* 05/18/2011 1004  CL 98 05/18/2011 1004   CO2 26 05/18/2011 1004   GLUCOSE 102* 05/18/2011 1004   BUN 14 05/18/2011 1004   CREATININE 0.65 05/18/2011 1004   CALCIUM 9.7 05/18/2011 1004   GFRNONAA >90 05/18/2011 1004   GFRAA >90 05/18/2011 1004   No results found for: CHOL No results found for: HGBA1C No results found for: VITAMINB12 No results found for: TSH  I reviewed images myself and agree with interpretation. -VRP  02/2314 MRI brain 1. Few punctate periventricular and subcortical foci of non-specific gliosis. These findings are non-specific and considerations include autoimmune, inflammatory, post-infectious, microvascular ischemic or migraine associated etiologies.  2. No acute findings.  3. Of note, this study was performed without IV contrast, as IV access could not be obtained.    ASSESSMENT AND PLAN  64 y.o. year old female here with left trigeminal nerve pain, in setting of multiple dental procedures in the left mandibular region and presumed infx/abscess treated with antibiotics. Now with superimposed depression.  Dx: left trigeminal  neuralgia   PLAN: I spent 15 minutes of face to face time with patient. Greater than 50% of time was spent in counseling and coordination of care with patient. In summary we discussed:  - continue gabapentin 900mg  TID  - try cymbalta - refer to psychology (situational depression)  Meds ordered this encounter  Medications  . DULoxetine (CYMBALTA) 30 MG capsule    Sig: Take 1 capsule (30 mg total) by mouth daily.    Dispense:  30 capsule    Refill:  12   Orders Placed This Encounter  Procedures  . Ambulatory referral to Psychology   Return in about 6 months (around 12/17/2015).    Penni Bombard, MD [id number], [id number] AM Certified in Neurology, Neurophysiology and Neuroimaging  [facility] [address], [place], [place] [address] [phone]

## 2015-06-18 NOTE — Patient Instructions (Signed)
Thank you for coming to see [place] at [facility]. I hope we have been able to provide you high quality care today.  You may receive a patient satisfaction survey over the next few weeks. We would appreciate your feedback and comments so that we may continue to improve ourselves and the health of our patients.  - try duloxetine 64m daily; after 2 weeks may increase to 645mdaily - I will refer you to a psychologist - continue gabapentin   ~~~~~~~~~~~~~~~~~~~~~~~~~~~~~~~~~~~~~~~~~~~~~~~~~~~~~~~~~~~~~~~~~  DR. PENUMALLI'S GUIDE TO HAPPY AND HEALTHY LIVING These are some of my general health and wellness recommendations. Some of them may apply to you better than others. Please use common sense as you try these suggestions and feel free to ask me any questions.   ACTIVITY/FITNESS Mental, social, emotional and physical stimulation are very important for brain and body health. Try learning a new activity (arts, music, language, sports, games).  Keep moving your body to the best of your abilities. You can do this at home, inside or outside, the park, community center, gym or anywhere you like. Consider a physical therapist or personal trainer to get started. Consider the app Sworkit. Fitness trackers such as smart-watches, smart-phones or Fitbits can help as well.   NUTRITION Eat more plants: colorful vegetables, nuts, seeds and berries.  Eat less sugar, salt, preservatives and processed foods.  Avoid toxins such as cigarettes and alcohol.  Drink water when you are thirsty. Warm water with a slice of lemon is an excellent morning drink to start the day.  Consider these websites for more information The Nutrition Source (hthttps://www.henry-hernandez.biz/[employer] (wwWindowBlog.ch  RELAXATION Consider practicing mindfulness meditation or other relaxation techniques such as deep breathing, prayer, yoga, tai chi, massage.  See website mindful.org or the apps Headspace or Calm to help get started.   SLEEP Try to get at least 7-8+ hours sleep per day. Regular exercise and reduced caffeine will help you sleep better. Practice good sleep hygeine techniques. See website sleep.org for more information.   PLANNING Prepare estate planning, living will, healthcare POA documents. Sometimes this is best planned with the help of an attorney. Theconversationproject.org and agingwithdignity.org are excellent resources.

## 2015-07-22 ENCOUNTER — Other Ambulatory Visit: Payer: Self-pay | Admitting: Diagnostic Neuroimaging

## 2015-09-06 ENCOUNTER — Ambulatory Visit (INDEPENDENT_AMBULATORY_CARE_PROVIDER_SITE_OTHER): Payer: Medicare Other | Admitting: Family Medicine

## 2015-09-06 VITALS — BP 112/70 | HR 95 | Temp 98.0°F | Resp 18 | Ht 64.75 in | Wt 174.4 lb

## 2015-09-06 DIAGNOSIS — M25461 Effusion, right knee: Secondary | ICD-10-CM

## 2015-09-06 DIAGNOSIS — M25561 Pain in right knee: Secondary | ICD-10-CM | POA: Diagnosis not present

## 2015-09-06 MED ORDER — MELOXICAM 7.5 MG PO TABS: 7.5000 mg | ORAL_TABLET | Freq: Every day | ORAL | Status: DC

## 2015-09-06 NOTE — Patient Instructions (Addendum)
IF you received an x-ray today, you will receive an invoice from [facility]. Please contact [facility] at [phone] with questions or concerns regarding your invoice.   IF you received labwork today, you will receive an invoice from [employer]. Please contact [facility] at [phone] with questions or concerns regarding your invoice.   Our billing staff will not be able to assist you with questions regarding bills from these companies.  You will be contacted with the lab results as soon as they are available. The fastest way to get your results is to activate your My Chart account. Instructions are located on the last page of this paperwork. If you have not heard from [place] regarding the results in 2 weeks, please contact this office.  Do not use the meloxicam with any other otc pain medication other than tylenol/acetaminophen - so no aleve, ibuprofen, motrin, advil, etc.  Call your orthopedic office on Monday and let them know you are having increasing pain and swelling over the same area you had surgery and that the urgent care told you to see your orthopedist this week.  Elevate and ice as much as possible.  Wrap with an ace wrap at night and wear brace during the day.  Knee Effusion Knee effusion means that you have excess fluid in your knee joint. This can cause pain and swelling in your knee. This may make your knee more difficult to bend and move. That is because there is increased pain and pressure in the joint. If there is fluid in your knee, it often means that something is wrong inside your knee, such as severe arthritis, abnormal inflammation, or an infection. Another common cause of knee effusion is an injury to the knee muscles, ligaments, or cartilage. HOME CARE INSTRUCTIONS  Use crutches as directed by your health care provider.  Wear a knee brace as directed by your health care provider.  Apply ice to the swollen area:  Put ice in a  plastic bag.  Place a towel between your skin and the bag.  Leave the ice on for 20 minutes, 2-3 times per day.  Keep your knee raised (elevated) when you are sitting or lying down.  Take medicines only as directed by your health care provider.  Do any rehabilitation or strengthening exercises as directed by your health care provider.  Rest your knee as directed by your health care provider. You may start doing your normal activities again when your health care provider approves.   Keep all follow-up visits as directed by your health care provider. This is important. SEEK MEDICAL CARE IF:  You have ongoing (persistent) pain in your knee. SEEK IMMEDIATE MEDICAL CARE IF:  You have increased swelling or redness of your knee.  You have severe pain in your knee.  You have a fever.   This information is not intended to replace advice given to you by your health care provider. Make sure you discuss any questions you have with your health care provider.   Document Released: 09/04/2003 Document Revised: 07/05/2014 Document Reviewed: 01/28/2014 Elsevier Interactive Patient Education [employer].

## 2015-09-06 NOTE — Progress Notes (Signed)
Subjective:  By signing my name below, I, Raven Small, attest that this documentation has been prepared under the direction and in the presence of Delman Cheadle, MD.  Electronically Signed: Thea Alken, ED Scribe. 09/06/2015. 1:15 PM.   Patient ID: Christina Hernandez, female    DOB: 05-07-1951, 65 y.o.   MRN: [id number]  HPI Chief Complaint  Patient presents with  . Knee Pain    C/O right knee pain off & on x 2 weeks. Had knee surgery on 12/13    HPI Comments: Christina Hernandez is a 65 y.o. female who presents to the [facility] complaining of right knee pain that began. Pt had right knee surgery for torn ligament 06/09/2012 by [facility]. She states right knee had been doing well but for the past 2 weeks she's had gradual worsening pain and swelling to medial aspect of knee. She reports worsening pain with walking and bearing weight. She has taken left over Vicodin and icing knee. She does not have knee brace at home. She had one visit with surgeon after surgery but not recently.  She rides the bicycle at the gym for exercise.  She has been seen at [place] in past.   Patient Active Problem List   Diagnosis Date Noted  . Left-sided trigeminal neuralgia 04/26/2014  . Left jaw numbness 04/26/2014   Past Medical History  Diagnosis Date  . Hypertension   . Hypothyroidism   . GERD (gastroesophageal reflux disease)     rarely occurs- no meds  . Shortness of breath     pt states sob on exertion because she is "out of shape"  . Diabetes [facility])    Past Surgical History  Procedure Laterality Date  . Uterine polyp    . Tubal ligation    . Hysteroscopy w/d&c  05/24/2011    Procedure: DILATATION AND CURETTAGE (D&C) /HYSTEROSCOPY;  Surgeon: [name], MD;  Location: [facility];  Service: Gynecology;  Laterality: N/A;  . Knee surgery Right 06/10/15    torn ligament   No Known Allergies Prior to Admission medications   Medication Sig Start Date End Date Taking?  Authorizing Provider  acetaminophen (TYLENOL) 500 MG tablet Take 2 tablets (1,000 mg total) by mouth every 6 (six) hours as needed. 10/29/14  Yes Tereasa Coop, PA-C  cetirizine (ZYRTEC) 10 MG tablet Take 1 tablet (10 mg total) by mouth daily. 10/29/14  Yes Tereasa Coop, PA-C  chlorhexidine (PERIDEX) 0.12 % solution  05/22/14  Yes Historical Provider, MD  DULoxetine (CYMBALTA) 30 MG capsule Take 1 capsule (30 mg total) by mouth daily. 06/18/15  Yes Vikram R Penumalli, MD  gabapentin (NEURONTIN) 300 MG capsule TAKE 3 CAPSULES (900 MG TOTAL) BY MOUTH 3 (THREE) TIMES DAILY. 07/22/15  Yes Penni Bombard, MD  HYDROcodone-acetaminophen (NORCO/VICODIN) 5-325 MG tablet 5-325 mg , 1-2 as needed 06/10/15  Yes Historical Provider, MD  ibuprofen (ADVIL,MOTRIN) 200 MG tablet Take 400-600 mg by mouth every 6 (six) hours as needed for moderate pain.   Yes Historical Provider, MD  levothyroxine (SYNTHROID, LEVOTHROID) 75 MCG tablet Take 75 mcg by mouth daily.     Yes Historical Provider, MD  losartan-hydrochlorothiazide (HYZAAR) 50-12.5 MG per tablet Take 1 tablet by mouth daily.     Yes Historical Provider, MD  Multiple Vitamin (MULTIVITAMIN WITH MINERALS) TABS tablet Take 1 tablet by mouth daily.   Yes Historical Provider, MD  pravastatin (PRAVACHOL) 40 MG tablet Take 40 mg by mouth daily.  Yes Historical Provider, MD  Saxagliptin-Metformin (KOMBIGLYZE XR) 5-500 MG TB24 Take 1 tablet by mouth daily.    Yes Historical Provider, MD   Social History   Social History  . Marital Status: Married    Spouse Name: [name]  . Number of Children: 4  . Years of Education: BS   Occupational History  . Retired Other   Social History Main Topics  . Smoking status: Never Smoker   . Smokeless tobacco: Never Used  . Alcohol Use: No  . Drug Use: No  . Sexual Activity: Not on file   Other Topics Concern  . Not on file   Social History Narrative   Patient lives at home with family.   Caffeine Use: occasionally    Review of Systems  Constitutional: Positive for activity change. Negative for fever, chills and diaphoresis.  Cardiovascular: Positive for leg swelling.  Musculoskeletal: Positive for joint swelling, arthralgias and gait problem. Negative for myalgias and neck stiffness.  Skin: Negative for color change and wound.  Neurological: Negative for weakness and numbness.  Hematological: Negative for adenopathy. Does not bruise/bleed easily.   Objective:   Physical Exam  Constitutional: She is oriented to person, place, and time. She appears well-developed and well-nourished. No distress.  HENT:  Head: Normocephalic and atraumatic.  Eyes: Conjunctivae and EOM are normal.  Neck: Neck supple.  Cardiovascular: Normal rate.   Pulmonary/Chest: Effort normal.  Musculoskeletal: Normal range of motion.  ROM at full flexion and extension very mildly restricted. Surgical ports at lower aspect of patella which is well healed with cystic reaction beneath them. Small amount medial effusion and joint line tenderness. Medial pain with varus and valgus stress. 4+/5 strength ham string and 5/5 quads.   Neurological: She is alert and oriented to person, place, and time.  Skin: Skin is warm and dry.  Psychiatric: She has a normal mood and affect. Her behavior is normal.  Nursing note and vitals reviewed.  Filed Vitals:   09/06/15 1257  BP: 112/70  Pulse: 95  Temp: 98 F (36.7 C)  TempSrc: Oral  Resp: 18  Height: 5' 4.75" (1.645 m)  Weight: 174 lb 6 oz (79.096 kg)  SpO2: 98%    Assessment & Plan:   1. Knee joint effusion, right   2. Right medial knee pain   Try mobic with prn tylenol.    Pt advised to f/u with ortho asap since she is having increased pain and swelling over the area she recently had surgery. RICE.  Meds ordered this encounter  Medications  . meloxicam (MOBIC) 7.5 MG tablet    Sig: Take 1 tablet (7.5 mg total) by mouth daily.    Dispense:  30 tablet    Refill:  0    I  personally performed the services described in this documentation, which was scribed in my presence. The recorded information has been reviewed and considered, and addended by me as needed.  Delman Cheadle, MD MPH

## 2015-09-17 ENCOUNTER — Other Ambulatory Visit: Payer: Self-pay | Admitting: Internal Medicine

## 2015-09-17 DIAGNOSIS — N6320 Unspecified lump in the left breast, unspecified quadrant: Secondary | ICD-10-CM

## 2015-09-23 ENCOUNTER — Ambulatory Visit
Admission: RE | Admit: 2015-09-23 | Discharge: 2015-09-23 | Disposition: A | Discharge status: Home / Self Care | Payer: Medicare Other | Source: Ambulatory Visit | Attending: Internal Medicine | Admitting: Internal Medicine

## 2015-09-23 DIAGNOSIS — N6320 Unspecified lump in the left breast, unspecified quadrant: Secondary | ICD-10-CM

## 2015-12-17 ENCOUNTER — Ambulatory Visit (INDEPENDENT_AMBULATORY_CARE_PROVIDER_SITE_OTHER): Payer: Medicare Other | Admitting: Diagnostic Neuroimaging

## 2015-12-17 ENCOUNTER — Encounter: Payer: Self-pay | Admitting: Diagnostic Neuroimaging

## 2015-12-17 VITALS — BP 112/72 | HR 101 | Ht 64.25 in | Wt 171.6 lb

## 2015-12-17 DIAGNOSIS — R0683 Snoring: Secondary | ICD-10-CM | POA: Diagnosis not present

## 2015-12-17 DIAGNOSIS — G4719 Other hypersomnia: Secondary | ICD-10-CM | POA: Diagnosis not present

## 2015-12-17 DIAGNOSIS — R200 Anesthesia of skin: Secondary | ICD-10-CM | POA: Diagnosis not present

## 2015-12-17 DIAGNOSIS — F329 Major depressive disorder, single episode, unspecified: Secondary | ICD-10-CM | POA: Diagnosis not present

## 2015-12-17 DIAGNOSIS — G500 Trigeminal neuralgia: Secondary | ICD-10-CM | POA: Diagnosis not present

## 2015-12-17 DIAGNOSIS — F32A Depression, unspecified: Secondary | ICD-10-CM

## 2015-12-17 MED ORDER — GABAPENTIN 300 MG PO CAPS: 900.0000 mg | ORAL_CAPSULE | Freq: Three times a day (TID) | ORAL | Status: DC

## 2015-12-17 MED ORDER — DULOXETINE HCL 60 MG PO CPEP: 60.0000 mg | ORAL_CAPSULE | Freq: Every day | ORAL | Status: DC

## 2015-12-17 NOTE — Progress Notes (Signed)
[facility]  PATIENT: Christina Hernandez DOB: May 24, 1951  REFERRING CLINICIAN:  HISTORY FROM: patient  REASON FOR VISIT: follow up   HISTORICAL  CHIEF COMPLAINT:  Chief Complaint  Patient presents with  . Left sided trigeminal neuralgia    rm 7, "trigeminal neuralgia is no better; medication is helping depression"   . Follow-up    6 month    HISTORY OF PRESENT ILLNESS:   UPDATE 12/17/15: Since last visit, symptoms are stable. Pain in left face ranging 5/10-8/10. Tolerating meds. On gabapentin 900mg  TID + duloxetine 30mg  daily. Depression stable (mild).   UPDATE 06/18/15: Since last visit, tried lyrica --> caused side effect of sleepiness. Now on gabapentin 900mg  TID. More depression as a result of left face pain (6/10 severity).   UPDATE 11/26/14: Since last visit, sxs have continued. Tried carbamazepine x 2 months without relief. Now off CBZ. Still on gabapentin. More depression and fatigue. Pain is 7/10 in severity.   UPDATE 07/30/14: Still with left lower lip numbness and burning pain. On gabapentin 600-900mg  TID, with only mild benefit.   UPDATE 04/26/14: Since last visit, stable. Still with left chin numbness and left jaw pain. On gabapentin 600mg  TID without significant relief. No new symptoms. Has appt with endocrine re: thyroid inflammation/enlargement. Also need parathyroid eval according to dentist, based on calcium levels and bone appearance.   PRIOR HPI (03/13/14): 65 year old right-handed female here for evaluation of left trigeminal neuralgia. January 2014 patient had second and third molar impaction, treated with extraction. Patient did well with the procedure. In May 2015 she developed "heavy feeling" in her left jaw. In 12/31/2013 patient had severe pain in her left lower jaw. She numbness in her left lower teeth, gums, Chin, jaw. Patient was noted to have left submandibular swelling, treated with antibiotics. CT scan showed florid cemento-osseous  dysplasia, and formal bone biopsy showed vital bone and no osteomyelitis. Patient was presumed to have left trigeminal neuralgia, mandibular branch, and started on gabapentin 300 mg daily. Since that time swelling in the left abdomen the region has reduced. She still has pain in the left jaw. She has significant numbness, tingling, very sensation in her left chin and left lower lip region.    REVIEW OF SYSTEMS: Full 14 system review of systems performed and negative except: numbness joint pain swelling fatigue drooling moles snoring.    ALLERGIES: Allergies  Allergen Reactions  . Cyclobenzaprine Nausea Only    HOME MEDICATIONS: Outpatient Prescriptions Prior to Visit  Medication Sig Dispense Refill  . acetaminophen (TYLENOL) 500 MG tablet Take 2 tablets (1,000 mg total) by mouth every 6 (six) hours as needed. 30 tablet 0  . cetirizine (ZYRTEC) 10 MG tablet Take 1 tablet (10 mg total) by mouth daily. 30 tablet 3  . chlorhexidine (PERIDEX) 0.12 % solution   12  . DULoxetine (CYMBALTA) 30 MG capsule Take 1 capsule (30 mg total) by mouth daily. 30 capsule 12  . gabapentin (NEURONTIN) 300 MG capsule TAKE 3 CAPSULES (900 MG TOTAL) BY MOUTH 3 (THREE) TIMES DAILY. 540 capsule 1  . levothyroxine (SYNTHROID, LEVOTHROID) 75 MCG tablet Take 75 mcg by mouth daily.      Marland Kitchen losartan-hydrochlorothiazide (HYZAAR) 50-12.5 MG per tablet Take 1 tablet by mouth daily.      . Multiple Vitamin (MULTIVITAMIN WITH MINERALS) TABS tablet Take 1 tablet by mouth daily.    . pravastatin (PRAVACHOL) 40 MG tablet Take 40 mg by mouth daily.    . Saxagliptin-Metformin (KOMBIGLYZE XR) 5-500  MG TB24 Take 1 tablet by mouth daily.     Marland Kitchen HYDROcodone-acetaminophen (NORCO/VICODIN) 5-325 MG tablet 5-325 mg , 1-2 as needed  0  . meloxicam (MOBIC) 7.5 MG tablet Take 1 tablet (7.5 mg total) by mouth daily. 30 tablet 0   No facility-administered medications prior to visit.    PAST MEDICAL HISTORY: Past Medical History  Diagnosis  Date  . Hypertension   . Hypothyroidism   . GERD (gastroesophageal reflux disease)     rarely occurs- no meds  . Shortness of breath     pt states sob on exertion because she is "out of shape"  . Diabetes ([place])     PAST SURGICAL HISTORY: Past Surgical History  Procedure Laterality Date  . Uterine polyp    . Tubal ligation    . Hysteroscopy w/d&c  05/24/2011    Procedure: DILATATION AND CURETTAGE (D&C) /HYSTEROSCOPY;  Surgeon: [name], MD;  Location: [facility];  Service: Gynecology;  Laterality: N/A;  . Knee surgery Right 06/10/15    torn ligament    FAMILY HISTORY: Family History  Problem Relation Age of Onset  . Hypertension Mother   . Heart disease Mother   . Kidney failure Mother   . Heart Problems Father   . Heart Problems Sister   . Cardiomyopathy Sister   . Ovarian cancer Sister     SOCIAL HISTORY:  Social History   Social History  . Marital Status: Married    Spouse Name: [name]  . Number of Children: 4  . Years of Education: BS   Occupational History  . Retired Other   Social History Main Topics  . Smoking status: Never Smoker   . Smokeless tobacco: Never Used  . Alcohol Use: No  . Drug Use: No  . Sexual Activity: Not on file   Other Topics Concern  . Not on file   Social History Narrative   Patient lives at home with family.   Caffeine Use: occasionally     PHYSICAL EXAM  Filed Vitals:   12/17/15 1034  BP: 112/72  Pulse: 101  Height: 5' 4.25" (1.632 m)  Weight: 171 lb 9.6 oz (77.837 kg)    Not recorded      Body mass index is 29.22 kg/(m^2).  GENERAL EXAM: Patient is in no distress; well developed, nourished and groomed; neck is supple; ENLARGED THYROID (NONTENDER)  CARDIOVASCULAR: Regular rate and rhythm, no murmurs, no carotid bruits  NEUROLOGIC: MENTAL STATUS: awake, alert, language fluent, comprehension intact, naming intact, fund of knowledge appropriate CRANIAL NERVE: pupils equal and reactive to light, visual fields  full to confrontation, extraocular muscles intact, no nystagmus, SENSATION NORMAL TO LIGHT TOUCH, FACIAL STRENGTH SYMM, hearing intact, palate elevates symmetrically, uvula midline, shoulder shrug symmetric, tongue midline. MOTOR: normal bulk and tone, full strength in the BUE, BLE SENSORY: normal and symmetric to light touch, pinprick COORDINATION: finger-nose-finger, fine finger movements normal REFLEXES: deep tendon reflexes TRACE and symmetric GAIT/STATION: narrow based gait; LIMPS ON RIGHT KNEE.    DIAGNOSTIC DATA (LABS, IMAGING, TESTING) - I reviewed patient records, labs, notes, testing and imaging myself where available.  Lab Results  Component Value Date   WBC 9.9 01/01/2014   HGB 12.6 01/01/2014   HCT 36.6 01/01/2014   MCV 81.0 01/01/2014   PLT 262 01/01/2014      Component Value Date/Time   NA 136 05/18/2011 1004   K 3.2* 05/18/2011 1004   CL 98 05/18/2011 1004   CO2 26 05/18/2011 1004  GLUCOSE 102* 05/18/2011 1004   BUN 14 05/18/2011 1004   CREATININE 0.65 05/18/2011 1004   CALCIUM 9.7 05/18/2011 1004   GFRNONAA >90 05/18/2011 1004   GFRAA >90 05/18/2011 1004   No results found for: CHOL No results found for: HGBA1C No results found for: VITAMINB12 No results found for: TSH   02/2314 MRI brain 1. Few punctate periventricular and subcortical foci of non-specific gliosis. These findings are non-specific and considerations include autoimmune, inflammatory, post-infectious, microvascular ischemic or migraine associated etiologies.  2. No acute findings.  3. Of note, this study was performed without IV contrast, as IV access could not be obtained.    ASSESSMENT AND PLAN  65 y.o. year old female here with left trigeminal nerve pain, in setting of multiple dental procedures in the left mandibular region and presumed infx/abscess treated with antibiotics. Also with mild situational depression.   Dx:  Left-sided trigeminal neuralgia  Left jaw  numbness  Depression  Snoring - Plan: Ambulatory referral to Sleep Studies  Excessive daytime sleepiness - Plan: Ambulatory referral to Sleep Studies     PLAN: - continue gabapentin 900mg  TID  - increase duloxetine to 60mg  daily - refer to neurosurgery ([facility]) for trigeminal neuralgia surgical options - refer to sleep study consult (snoring, freq waking, excessive daytime sleepiness)  Meds ordered this encounter  Medications  . DULoxetine (CYMBALTA) 60 MG capsule    Sig: Take 1 capsule (60 mg total) by mouth daily.    Dispense:  180 capsule    Refill:  4  . gabapentin (NEURONTIN) 300 MG capsule    Sig: Take 3 capsules (900 mg total) by mouth 3 (three) times daily.    Dispense:  540 capsule    Refill:  4   Orders Placed This Encounter  Procedures  . Ambulatory referral to Sleep Studies  . Ambulatory referral to Neurosurgery   Return in about 6 months (around 06/17/2016).    Penni Bombard, MD [id number], [id number] AM Certified in Neurology, Neurophysiology and Neuroimaging  [facility] [address] [place], [place] [address] [phone]

## 2015-12-17 NOTE — Patient Instructions (Addendum)
Thank you for coming to see [place] at [facility]. I hope we have been able to provide you high quality care today.  You may receive a patient satisfaction survey over the next few weeks. We would appreciate your feedback and comments so that we may continue to improve ourselves and the health of our patients.  - continue gabapentin 96m three times per day  - increase duloxetine to 640mdaily  - I will refer you to neurosurgery ([facility])  - I will refer you for sleep study ([facility])   ~~~~~~~~~~~~~~~~~~~~~~~~~~~~~~~~~~~~~~~~~~~~~~~~~~~~~~~~~~~~~~~~~  DR. Montell Leopard'S GUIDE TO HAPPY AND HEALTHY LIVING These are some of my general health and wellness recommendations. Some of them may apply to you better than others. Please use common sense as you try these suggestions and feel free to ask me any questions.   ACTIVITY/FITNESS Mental, social, emotional and physical stimulation are very important for brain and body health. Try learning a new activity (arts, music, language, sports, games).  Keep moving your body to the best of your abilities. You can do this at home, inside or outside, the park, community center, gym or anywhere you like. Consider a physical therapist or personal trainer to get started. Consider the app Sworkit. Fitness trackers such as smart-watches, smart-phones or Fitbits can help as well.   NUTRITION Eat more plants: colorful vegetables, nuts, seeds and berries.  Eat less sugar, salt, preservatives and processed foods.  Avoid toxins such as cigarettes and alcohol.  Drink water when you are thirsty. Warm water with a slice of lemon is an excellent morning drink to start the day.  Consider these websites for more information The [employer] Source (hthttps://www.henry-hernandez.biz/[employer] (wwWindowBlog.ch  RELAXATION Consider practicing mindfulness meditation or other relaxation techniques such as deep  breathing, prayer, yoga, tai chi, massage. See website mindful.org or the apps Headspace or Calm to help get started.   SLEEP Try to get at least 7-8+ hours sleep per day. Regular exercise and reduced caffeine will help you sleep better. Practice good sleep hygeine techniques. See website sleep.org for more information.   PLANNING Prepare estate planning, living will, healthcare POA documents. Sometimes this is best planned with the help of an attorney. Theconversationproject.org and agingwithdignity.org are excellent resources.

## 2016-01-06 ENCOUNTER — Institutional Professional Consult (permissible substitution): Payer: Medicare Other | Admitting: Neurology

## 2016-01-06 ENCOUNTER — Telehealth: Payer: Self-pay

## 2016-01-06 NOTE — Telephone Encounter (Signed)
Patient did not show to new sleep consult appt.  

## 2016-01-12 ENCOUNTER — Encounter: Payer: Self-pay | Admitting: Neurology

## 2016-01-29 ENCOUNTER — Other Ambulatory Visit: Payer: Self-pay | Admitting: Diagnostic Neuroimaging

## 2016-03-23 ENCOUNTER — Ambulatory Visit (INDEPENDENT_AMBULATORY_CARE_PROVIDER_SITE_OTHER): Payer: Medicare Other | Admitting: Family Medicine

## 2016-03-23 VITALS — BP 130/62 | HR 99 | Temp 99.2°F | Resp 18 | Ht 65.0 in | Wt 169.4 lb

## 2016-03-23 DIAGNOSIS — B9789 Other viral agents as the cause of diseases classified elsewhere: Secondary | ICD-10-CM

## 2016-03-23 DIAGNOSIS — J069 Acute upper respiratory infection, unspecified: Secondary | ICD-10-CM

## 2016-03-23 MED ORDER — GUAIFENESIN ER 600 MG PO TB12: 600.0000 mg | ORAL_TABLET | Freq: Two times a day (BID) | ORAL | 0 refills | Status: DC | PRN

## 2016-03-23 MED ORDER — ACETAMINOPHEN 500 MG PO TABS
500.0000 mg | ORAL_TABLET | Freq: Three times a day (TID) | ORAL | 1 refills | Status: DC | PRN
Start: 2016-03-23 — End: 2019-04-18

## 2016-03-23 NOTE — Progress Notes (Signed)
Chart reviewed, patient discussed with Dr. Drema Dallas. Agree with findings, assessment and plan of care per her note.

## 2016-03-23 NOTE — Patient Instructions (Signed)
     IF you received an x-ray today, you will receive an invoice from [facility]. Please contact [facility] at [phone] with questions or concerns regarding your invoice.   IF you received labwork today, you will receive an invoice from [facility]. Please contact [facility] at [phone] with questions or concerns regarding your invoice.   Our billing staff will not be able to assist you with questions regarding bills from these companies.  You will be contacted with the lab results as soon as they are available. The fastest way to get your results is to activate your My Chart account. Instructions are located on the last page of this paperwork. If you have not heard from us regarding the results in 2 weeks, please contact this office.      

## 2016-03-23 NOTE — Assessment & Plan Note (Signed)
Recommend increased fluids- tea, water, soup.  Mucinex and tylenol to help with symptoms.  Follow up as needed.  Do not believe this is strep or influenza, so will not check these at this time.

## 2016-03-23 NOTE — Progress Notes (Signed)
   Subjective:    Patient ID: Christina Hernandez, female    DOB: 03-22-1951, 65 y.o.   MRN: [id number]  HPI complains of nasal congestion, sore throat, cough without sputum production for past 4 days. She states that it has progressively become worse. She has not been drinking much fluids but she has been able the. She does have a decreased appetite. Denies any sputum production, shortness of breath, chest pain. Her cough does not wake her up at night. She denies any fevers or chills. No sick contacts. She states she gets sick like this every year.    Review of Systems  Constitutional: Positive for appetite change and fatigue. Negative for chills, diaphoresis and fever.  HENT: Positive for congestion, rhinorrhea and sore throat. Negative for ear pain and trouble swallowing.   Eyes: Negative for discharge and itching.  Respiratory: Positive for cough. Negative for chest tightness, shortness of breath and wheezing.   Cardiovascular: Negative for chest pain, palpitations and leg swelling.  Gastrointestinal: Negative for abdominal pain and nausea.  Genitourinary: Negative for dysuria and urgency.  Musculoskeletal: Negative for arthralgias and joint swelling.  Skin: Negative for rash and wound.  Neurological: Negative for dizziness and headaches.  Psychiatric/Behavioral: Negative for agitation and confusion.  All other systems reviewed and are negative.      Objective:   Physical Exam  Constitutional: She is oriented to person, place, and time. She appears well-developed and well-nourished. No distress.  HENT:  Head: Normocephalic and atraumatic.  Right Ear: External ear normal.  Left Ear: External ear normal.  Mouth/Throat: No oropharyngeal exudate.  Left ear cerumen impaction Erythematous, boggy nasal mucosa No erythema or exudate in oropharynx.    Neck: Normal range of motion. Neck supple.  Cardiovascular: Regular rhythm, normal heart sounds and intact distal pulses.  Exam reveals no  gallop and no friction rub.   No murmur heard. Mildly tachycardic  Pulmonary/Chest: Effort normal and breath sounds normal. No stridor. No respiratory distress. She has no wheezes. She has no rales. She exhibits no tenderness.  Musculoskeletal: Normal range of motion. She exhibits no edema.  Lymphadenopathy:    She has no cervical adenopathy.  Neurological: She is alert and oriented to person, place, and time.  Skin: Skin is warm. No rash noted. She is not diaphoretic. No erythema.  Psychiatric: She has a normal mood and affect. Her behavior is normal. Judgment and thought content normal.  Nursing note and vitals reviewed.   BP 130/62 (BP Location: Right Arm, Patient Position: Sitting, Cuff Size: Large)   Pulse 99   Temp 99.2 F (37.3 C) (Oral)   Resp 18   Ht 5\' 5"  (1.651 m)   Wt 169 lb 6.4 oz (76.8 kg)   SpO2 98%   BMI 28.19 kg/m        Assessment & Plan:  Acute upper respiratory infection Recommend increased fluids- tea, water, soup.  Mucinex and tylenol to help with symptoms.  Follow up as needed.  Do not believe this is strep or influenza, so will not check these at this time.    Signed,  Balinda Quails, DO [facility] 3:24 PM  03/23/16

## 2016-04-08 HISTORY — PX: OTHER SURGICAL HISTORY: SHX169

## 2016-04-12 ENCOUNTER — Ambulatory Visit (INDEPENDENT_AMBULATORY_CARE_PROVIDER_SITE_OTHER): Payer: Medicare Other | Admitting: Family Medicine

## 2016-04-12 VITALS — BP 122/74 | HR 94 | Temp 98.1°F | Resp 17 | Ht 65.0 in | Wt 167.0 lb

## 2016-04-12 DIAGNOSIS — Z23 Encounter for immunization: Secondary | ICD-10-CM

## 2016-04-12 DIAGNOSIS — R05 Cough: Secondary | ICD-10-CM

## 2016-04-12 DIAGNOSIS — R059 Cough, unspecified: Secondary | ICD-10-CM

## 2016-04-12 DIAGNOSIS — R1032 Left lower quadrant pain: Secondary | ICD-10-CM | POA: Diagnosis not present

## 2016-04-12 LAB — POCT URINALYSIS DIP (MANUAL ENTRY)
Bilirubin, UA: NEGATIVE
Glucose, UA: NEGATIVE
Ketones, POC UA: NEGATIVE
Leukocytes, UA: NEGATIVE
Nitrite, UA: NEGATIVE
Spec Grav, UA: 1.02
Urobilinogen, UA: 0.2
pH, UA: 6

## 2016-04-12 LAB — POCT CBC
Granulocyte percent: 53.5 %G (ref 37–80)
HCT, POC: 33.5 % — AB (ref 37.7–47.9)
Hemoglobin: 11.2 g/dL — AB (ref 12.2–16.2)
Lymph, poc: 2.8 (ref 0.6–3.4)
MCH, POC: 26.3 pg — AB (ref 27–31.2)
MCHC: 33.5 g/dL (ref 31.8–35.4)
MCV: 78.5 fL — AB (ref 80–97)
MID (cbc): 0.3 (ref 0–0.9)
MPV: 8.4 fL (ref 0–99.8)
POC Granulocyte: 3.6 (ref 2–6.9)
POC LYMPH PERCENT: 42 %L (ref 10–50)
POC MID %: 4.5 %M (ref 0–12)
Platelet Count, POC: 247 10*3/uL (ref 142–424)
RBC: 4.27 M/uL (ref 4.04–5.48)
RDW, POC: 16 %
WBC: 6.7 10*3/uL (ref 4.6–10.2)

## 2016-04-12 LAB — POC MICROSCOPIC URINALYSIS (UMFC): Mucus: ABSENT

## 2016-04-12 NOTE — Patient Instructions (Addendum)
IF you received an x-ray today, you will receive an invoice from [facility]. Please contact [facility] at [phone] with questions or concerns regarding your invoice.   IF you received labwork today, you will receive an invoice from [employer]. Please contact [facility] at [phone] with questions or concerns regarding your invoice.   Our billing staff will not be able to assist you with questions regarding bills from these companies.  You will be contacted with the lab results as soon as they are available. The fastest way to get your results is to activate your My Chart account. Instructions are located on the last page of this paperwork. If you have not heard from [place] regarding the results in 2 weeks, please contact this office.     Diverticulitis Diverticulitis is inflammation or infection of small pouches in your colon that form when you have a condition called diverticulosis. The pouches in your colon are called diverticula. Your colon, or large intestine, is where water is absorbed and stool is formed. Complications of diverticulitis can include:  Bleeding.  Severe infection.  Severe pain.  Perforation of your colon.  Obstruction of your colon. CAUSES  Diverticulitis is caused by bacteria. Diverticulitis happens when stool becomes trapped in diverticula. This allows bacteria to grow in the diverticula, which can lead to inflammation and infection. RISK FACTORS People with diverticulosis are at risk for diverticulitis. Eating a diet that does not include enough fiber from fruits and vegetables may make diverticulitis more likely to develop. SYMPTOMS  Symptoms of diverticulitis may include:  Abdominal pain and tenderness. The pain is normally located on the left side of the abdomen, but may occur in other areas.  Fever and chills.  Bloating.  Cramping.  Nausea.  Vomiting.  Constipation.  Diarrhea.  Blood in  your stool. DIAGNOSIS  Your health care provider will ask you about your medical history and do a physical exam. You may need to have tests done because many medical conditions can cause the same symptoms as diverticulitis. Tests may include:  Blood tests.  Urine tests.  Imaging tests of the abdomen, including X-rays and CT scans. When your condition is under control, your health care provider may recommend that you have a colonoscopy. A colonoscopy can show how severe your diverticula are and whether something else is causing your symptoms. TREATMENT  Most cases of diverticulitis are mild and can be treated at home. Treatment may include:  Taking over-the-counter pain medicines.  Following a clear liquid diet.  Taking antibiotic medicines by mouth for 7-10 days. More severe cases may be treated at a hospital. Treatment may include:  Not eating or drinking.  Taking prescription pain medicine.  Receiving antibiotic medicines through an IV tube.  Receiving fluids and nutrition through an IV tube.  Surgery. HOME CARE INSTRUCTIONS   Follow your health care provider's instructions carefully.  Follow a full liquid diet or other diet as directed by your health care provider. After your symptoms improve, your health care provider may tell you to change your diet. He or she may recommend you eat a high-fiber diet. Fruits and vegetables are good sources of fiber. Fiber makes it easier to pass stool.  Take fiber supplements or probiotics as directed by your health care provider.  Only take medicines as directed by your health care provider.  Keep all your follow-up appointments. SEEK MEDICAL CARE IF:   Your pain does not improve.  You have a hard time eating  food.  Your bowel movements do not return to normal. SEEK IMMEDIATE MEDICAL CARE IF:   Your pain becomes worse.  Your symptoms do not get better.  Your symptoms suddenly get worse.  You have a fever.  You have  repeated vomiting.  You have bloody or black, tarry stools. MAKE SURE YOU:   Understand these instructions.  Will watch your condition.  Will get help right away if you are not doing well or get worse.   This information is not intended to replace advice given to you by your health care provider. Make sure you discuss any questions you have with your health care provider.   Document Released: 03/24/2005 Document Revised: 06/19/2013 Document Reviewed: 05/09/2013 Elsevier Interactive Patient Education [employer].

## 2016-04-12 NOTE — Progress Notes (Signed)
Chief Complaint  Patient presents with  . Flank Pain    lower left abdomen onset 1 week  . Cough    HPI   One week history of left lower quadrant abdominal pain When she moves bends or turns the pain is 7/10 The pain is sharp and comes and goes and may be cholicky She reports that she had a colonoscopy and has no known history of diverticular disease 10 years ago She states that she felt cold overnight but denies fevers She has been having more BM than usual last week She generally has a bm in the morning but noted that she was having 3 BM a day She reports that the stools were soft but not diarrhea She reports that her last thyroid check was June and  She is s/p total hysterectomy  Diabetes Her last a1c was 6.2 This was 3 months ago She does not check her blood glucose at home any more She sticks to a diabetic diet   Past Medical History:  Diagnosis Date  . Diabetes ([place])   . GERD (gastroesophageal reflux disease)    rarely occurs- no meds  . Hypertension   . Hypothyroidism   . Shortness of breath    pt states sob on exertion because she is "out of shape"    Current Outpatient Prescriptions  Medication Sig Dispense Refill  . acetaminophen (TYLENOL) 500 MG tablet Take 1 tablet (500 mg total) by mouth every 8 (eight) hours as needed. 30 tablet 1  . Biotin 5000 MCG CAPS Take by mouth daily.    . cetirizine (ZYRTEC) 10 MG tablet Take 1 tablet (10 mg total) by mouth daily. 30 tablet 3  . cholecalciferol (VITAMIN D) 400 units TABS tablet Take 5,000 Units by mouth.     . DULoxetine (CYMBALTA) 60 MG capsule Take 1 capsule (60 mg total) by mouth daily. 180 capsule 4  . levothyroxine (SYNTHROID, LEVOTHROID) 75 MCG tablet Take 75 mcg by mouth daily.      Marland Kitchen losartan-hydrochlorothiazide (HYZAAR) 50-12.5 MG per tablet Take 1 tablet by mouth daily.      . Multiple Vitamin (MULTIVITAMIN WITH MINERALS) TABS tablet Take 1 tablet by mouth daily.    . NONFORMULARY OR COMPOUNDED ITEM  Compounded cream for right knee as needed    . pravastatin (PRAVACHOL) 40 MG tablet Take 40 mg by mouth daily.    . Saxagliptin-Metformin (KOMBIGLYZE XR) 5-500 MG TB24 Take 1 tablet by mouth daily.     Marland Kitchen gabapentin (NEURONTIN) 300 MG capsule Take 3 capsules (900 mg total) by mouth 3 (three) times daily. (Patient not taking: Reported on 04/12/2016) 540 capsule 4   No current facility-administered medications for this visit.     Allergies:  Allergies  Allergen Reactions  . Cyclobenzaprine Nausea Only    Past Surgical History:  Procedure Laterality Date  . cyber knife Left 04/08/2016  . HYSTEROSCOPY W/D&C  05/24/2011   Procedure: DILATATION AND CURETTAGE (D&C) /HYSTEROSCOPY;  Surgeon: [name], MD;  Location: [facility];  Service: Gynecology;  Laterality: N/A;  . KNEE SURGERY Right 06/10/15   torn ligament  . TUBAL LIGATION    . uterine polyp      Social History   Social History  . Marital status: Married    Spouse name: [name]  . Number of children: 4  . Years of education: BS   Occupational History  . Retired Other   Social History Main Topics  . Smoking status: Never Smoker  . Smokeless  tobacco: Never Used  . Alcohol use No  . Drug use: No  . Sexual activity: Not Asked   Other Topics Concern  . None   Social History Narrative   Patient lives at home with family.   Caffeine Use: occasionally    ROS  Objective: Vitals:   04/12/16 1039  BP: 122/74  Pulse: 94  Resp: 17  Temp: 98.1 F (36.7 C)  TempSrc: Oral  SpO2: 96%  Weight: 167 lb (75.8 kg)  Height: 5\' 5"  (1.651 m)    Physical Exam General: alert, oriented, in NAD Head: normocephalic, atraumatic, no sinus tenderness Eyes: EOM intact, no scleral icterus or conjunctival injection Ears: TM clear bilaterally Throat: no pharyngeal exudate or erythema Lymph: no posterior auricular, submental or cervical lymph adenopathy Heart: normal rate, normal sinus rhythm, no murmurs Lungs: clear to auscultation  bilaterally, no wheezing Abdomen: nondistended, normoactive bs, soft, tender to deep palpation of the LLQ, no rebound, no guarding, no palpable mass  Assessment and Plan Makisha was seen today for flank pain and cough.  Diagnoses and all orders for this visit:  LLQ abdominal pain- new problem concerning for diverticular disease Pending cbc and ct scan will treat with cipro flagyl if there is diverticulitis Advised to call GI to set up appt for colonoscopy in a few weeks -     POCT urinalysis dipstick -     POCT Microscopic Urinalysis ([facility]) -     POCT CBC -     CT Abdomen Pelvis W Contrast; Future -     Urine culture  Need for prophylactic vaccination and inoculation against influenza -     Flu Vaccine QUAD 36+ mos IM  Cough- new problem likely viral URI Supportive care advised      [place]

## 2016-04-13 LAB — URINE CULTURE

## 2016-04-14 ENCOUNTER — Telehealth: Payer: Self-pay | Admitting: Family Medicine

## 2016-04-14 NOTE — Telephone Encounter (Signed)
Left message that there was no leukocytosis. Urine culture did not show infection. Advised to follow up with CT abdomen.  Call clinic if symptoms are worsening.

## 2016-04-15 ENCOUNTER — Ambulatory Visit
Admission: RE | Admit: 2016-04-15 | Discharge: 2016-04-15 | Disposition: A | Discharge status: Home / Self Care | Payer: Medicare Other | Source: Ambulatory Visit | Attending: Family Medicine | Admitting: Family Medicine

## 2016-04-15 DIAGNOSIS — R1032 Left lower quadrant pain: Secondary | ICD-10-CM

## 2016-04-15 MED ORDER — IOPAMIDOL (ISOVUE-300) INJECTION 61%
100.0000 mL | Freq: Once | INTRAVENOUS | Status: AC | PRN
  Administered 2016-04-15: 100 mL via INTRAVENOUS

## 2016-04-19 ENCOUNTER — Telehealth: Payer: Self-pay | Admitting: Family Medicine

## 2016-04-19 NOTE — Telephone Encounter (Signed)
Discussed with patient that her ct abd/pelvis was normal.  There are no  Masses noted. Labs did not show elevated wbc. No UTI.  She was advised to take slow Fe once a day for her mild anemia. If this persists she should follow up with Colonoscopy.

## 2016-06-11 ENCOUNTER — Ambulatory Visit: Payer: Medicare Other | Admitting: Diagnostic Neuroimaging

## 2016-07-21 ENCOUNTER — Other Ambulatory Visit (INDEPENDENT_AMBULATORY_CARE_PROVIDER_SITE_OTHER): Payer: Self-pay | Admitting: Otolaryngology

## 2016-07-21 DIAGNOSIS — E042 Nontoxic multinodular goiter: Secondary | ICD-10-CM

## 2016-07-26 ENCOUNTER — Ambulatory Visit
Admission: RE | Admit: 2016-07-26 | Discharge: 2016-07-26 | Disposition: A | Discharge status: Home / Self Care | Payer: Medicare Other | Source: Ambulatory Visit | Attending: Otolaryngology | Admitting: Otolaryngology

## 2016-07-26 DIAGNOSIS — E042 Nontoxic multinodular goiter: Secondary | ICD-10-CM

## 2016-07-26 MED ORDER — IOPAMIDOL (ISOVUE-300) INJECTION 61%
75.0000 mL | Freq: Once | INTRAVENOUS | Status: AC | PRN
  Administered 2016-07-26: 75 mL via INTRAVENOUS

## 2016-08-04 ENCOUNTER — Other Ambulatory Visit (INDEPENDENT_AMBULATORY_CARE_PROVIDER_SITE_OTHER): Payer: Self-pay | Admitting: Otolaryngology

## 2016-08-04 DIAGNOSIS — R911 Solitary pulmonary nodule: Secondary | ICD-10-CM

## 2016-08-05 ENCOUNTER — Ambulatory Visit
Admission: RE | Admit: 2016-08-05 | Discharge: 2016-08-05 | Disposition: A | Discharge status: Home / Self Care | Payer: Medicare Other | Source: Ambulatory Visit | Attending: Otolaryngology | Admitting: Otolaryngology

## 2016-08-05 DIAGNOSIS — R911 Solitary pulmonary nodule: Secondary | ICD-10-CM

## 2016-08-05 MED ORDER — IOPAMIDOL (ISOVUE-300) INJECTION 61%
75.0000 mL | Freq: Once | INTRAVENOUS | Status: AC | PRN
  Administered 2016-08-05: 75 mL via INTRAVENOUS

## 2016-08-30 ENCOUNTER — Other Ambulatory Visit: Payer: Self-pay | Admitting: Otolaryngology

## 2016-08-31 NOTE — Pre-Procedure Instructions (Addendum)
Christina Hernandez  08/31/2016      [id number] [address], [name] [address] Phone: [phone] Fax: [phone]    Your procedure is scheduled on Wed. Mar 14 @ 830 AM.  Report to [facility] at 630 AM.  Call this number if you have problems the morning of surgery:  [phone]   Remember:  Do not eat food or drink liquids after midnight.   Take these medicines the morning of surgery with A SIP OF WATER levothyroxine (synthroid), cetirizine (zyrtec), duloxetine (cymbalta), acetaminophen (tylenol) if needed.  7 days prior to surgery STOP taking any Aspirin, Aleve, Naproxen, Ibuprofen, Motrin, Advil, Goody's, BC's, all herbal medications, fish oil, and all vitamins  WHAT DO I DO ABOUT MY DIABETES MEDICATION?   Marland Kitchen Do not take oral diabetes medicines (pills) the morning of surgery. Saxagliptin-Metformin (KOMBIGLYZE XR)   How to Manage Your Diabetes Before and After Surgery  Why is it important to control my blood sugar before and after surgery? . Improving blood sugar levels before and after surgery helps healing and can limit problems. . A way of improving blood sugar control is eating a healthy diet by: o  Eating less sugar and carbohydrates o  Increasing activity/exercise o  Talking with your doctor about reaching your blood sugar goals . High blood sugars (greater than 180 mg/dL) can raise your risk of infections and slow your recovery, so you will need to focus on controlling your diabetes during the weeks before surgery. . Make sure that the doctor who takes care of your diabetes knows about your planned surgery including the date and location.  How do I manage my blood sugar before surgery?  . Check your blood sugar at least 4 times a day, starting 2 days before surgery, to make sure that the level is not too high or low. o Check your blood sugar the morning of your surgery when you  wake up and every 2 hours until you get to the Short Stay unit. . If your blood sugar is less than 70 mg/dL, you will need to treat for low blood sugar: o Do not take insulin. o Treat a low blood sugar (less than 70 mg/dL) with  cup of clear juice (cranberry or apple), 4 glucose tablets, OR glucose gel. o Recheck blood sugar in 15 minutes after treatment (to make sure it is greater than 70 mg/dL). If your blood sugar is not greater than 70 mg/dL on recheck, call [phone] for further instructions. . Report your blood sugar to the short stay nurse when you get to Short Stay.  . If you are admitted to the hospital after surgery: o Your blood sugar will be checked by the staff and you will probably be given insulin after surgery (instead of oral diabetes medicines) to make sure you have good blood sugar levels. o The goal for blood sugar control after surgery is 80-180 mg/dL.     Do not wear jewelry, make-up or nail polish.  Do not wear lotions, powders, or perfumes, or deoderant.  Do not shave 48 hours prior to surgery.  Men may shave face and neck.  Do not bring valuables to the hospital.  [facility] is not responsible for any belongings or valuables.  Contacts, dentures or bridgework may not be worn into surgery.  Leave your suitcase in the car.  After surgery it may be brought to your room.  For patients admitted to the hospital, discharge time will be determined by your treatment team.  Patients discharged the day of surgery will not be allowed to drive home.    Special instructions:   [facility]- Preparing For Surgery  Before surgery, you can play an important role. Because skin is not sterile, your skin needs to be as free of germs as possible. You can reduce the number of germs on your skin by washing with CHG (chlorahexidine gluconate) Soap before surgery.  CHG is an antiseptic cleaner which kills germs and bonds with the skin to continue killing germs even after  washing.  Please do not use if you have an allergy to CHG or antibacterial soaps. If your skin becomes reddened/irritated stop using the CHG.  Do not shave (including legs and underarms) for at least 48 hours prior to first CHG shower. It is OK to shave your face.  Please follow these instructions carefully.   1. Shower the NIGHT BEFORE SURGERY and the MORNING OF SURGERY with CHG.   2. If you chose to wash your hair, wash your hair first as usual with your normal shampoo.  3. After you shampoo, rinse your hair and body thoroughly to remove the shampoo.  4. Use CHG as you would any other liquid soap. You can apply CHG directly to the skin and wash gently with a scrungie or a clean washcloth.   5. Apply the CHG Soap to your body ONLY FROM THE NECK DOWN.  Do not use on open wounds or open sores. Avoid contact with your eyes, ears, mouth and genitals (private parts). Wash genitals (private parts) with your normal soap.  6. Wash thoroughly, paying special attention to the area where your surgery will be performed.  7. Thoroughly rinse your body with warm water from the neck down.  8. DO NOT shower/wash with your normal soap after using and rinsing off the CHG Soap.  9. Pat yourself dry with a CLEAN TOWEL.   10. Wear CLEAN PAJAMAS   11. Place CLEAN SHEETS on your bed the night of your first shower and DO NOT SLEEP WITH PETS.    Day of Surgery: Do not apply any deodorants/lotions. Please wear clean clothes to the hospital/surgery center.      Please read over the following fact sheets that you were given. Pain Booklet, Coughing and Deep Breathing and Surgical Site Infection Prevention

## 2016-09-01 ENCOUNTER — Encounter (HOSPITAL_COMMUNITY): Payer: Self-pay

## 2016-09-01 ENCOUNTER — Ambulatory Visit (HOSPITAL_COMMUNITY)
Admission: RE | Admit: 2016-09-01 | Discharge: 2016-09-01 | Disposition: A | Discharge status: Home / Self Care | Payer: Medicare Other | Source: Ambulatory Visit | Attending: Anesthesiology | Admitting: Anesthesiology

## 2016-09-01 ENCOUNTER — Encounter (HOSPITAL_COMMUNITY)
Admission: RE | Admit: 2016-09-01 | Discharge: 2016-09-01 | Disposition: A | Discharge status: Home / Self Care | Payer: Medicare Other | Source: Ambulatory Visit | Attending: Otolaryngology | Admitting: Otolaryngology

## 2016-09-01 DIAGNOSIS — Z01818 Encounter for other preprocedural examination: Secondary | ICD-10-CM

## 2016-09-01 DIAGNOSIS — I10 Essential (primary) hypertension: Secondary | ICD-10-CM | POA: Diagnosis not present

## 2016-09-01 LAB — BASIC METABOLIC PANEL
Anion gap: 10 (ref 5–15)
BUN: 10 mg/dL (ref 6–20)
CO2: 26 mmol/L (ref 22–32)
Calcium: 9.5 mg/dL (ref 8.9–10.3)
Chloride: 104 mmol/L (ref 101–111)
Creatinine, Ser: 0.65 mg/dL (ref 0.44–1.00)
GFR calc Af Amer: >60 mL/min (ref >60)
GFR calc non Af Amer: >60 mL/min (ref >60)
Glucose, Bld: 112 mg/dL — ABNORMAL HIGH (ref 65–99)
Potassium: 3.7 mmol/L (ref 3.5–5.1)
Sodium: 140 mmol/L (ref 135–145)

## 2016-09-01 LAB — CBC
HCT: 36.7 % (ref 36.0–46.0)
Hemoglobin: 11.6 g/dL — ABNORMAL LOW (ref 12.0–15.0)
MCH: 26.2 pg (ref 26.0–34.0)
MCHC: 31.6 g/dL (ref 30.0–36.0)
MCV: 83 fL (ref 78.0–100.0)
Platelets: 302 10*3/uL (ref 150–400)
RBC: 4.42 MIL/uL (ref 3.87–5.11)
RDW: 14.9 % (ref 11.5–15.5)
WBC: 5.1 10*3/uL (ref 4.0–10.5)

## 2016-09-01 LAB — GLUCOSE, CAPILLARY: Glucose-Capillary: 95 mg/dL (ref 65–99)

## 2016-09-01 NOTE — Progress Notes (Signed)
PCP - [facility] Chard Cardiologist - denies  Chest x-ray - 09/01/16 EKG - 09/01/16 Stress Test - unknown thinks she had one but cant remember ECHO - denies Cardiac Cath - denies    Fasting Blood Sugar - less than 100 Checks Blood Sugar rarely but will check for surgery  Requesting a1c from PCP   Patient denies shortness of breath, fever, cough and chest pain at PAT appointment   Patient verbalized understanding of instructions that was given to them at the PAT appointment. Patient expressed that there were no further questions.  Patient was also instructed that they will need to review over the PAT instructions again at home before the surgery.

## 2016-09-07 NOTE — Progress Notes (Signed)
Attempted to call Dr Baird Cancer office with out success- office may be closed due to weather- no answer just recording noted.

## 2016-09-07 NOTE — Anesthesia Preprocedure Evaluation (Addendum)
Anesthesia Evaluation  Patient identified by MRN, date of birth, ID band Patient awake    Reviewed: Allergy & Precautions, NPO status , Patient's Chart, lab work & pertinent test results  History of Anesthesia Complications Negative for: history of anesthetic complications  Airway Mallampati: II       Dental  (+) Teeth Intact, Dental Advisory Given   Pulmonary neg pulmonary ROS,    Pulmonary exam normal        Cardiovascular hypertension, Pt. on medications negative cardio ROS Normal cardiovascular exam     Neuro/Psych negative neurological ROS  negative psych ROS   GI/Hepatic Neg liver ROS, GERD  Controlled,  Endo/Other  negative endocrine ROSdiabetesHypothyroidism   Renal/GU negative Renal ROS     Musculoskeletal negative musculoskeletal ROS (+)   Abdominal   Peds  Hematology negative hematology ROS (+)   Anesthesia Other Findings Day of surgery medications reviewed with the patient.  Reproductive/Obstetrics                          Anesthesia Physical Anesthesia Plan  ASA: II  Anesthesia Plan: General   Post-op Pain Management:    Induction: Intravenous  Airway Management Planned: Oral ETT  Additional Equipment:   Intra-op Plan:   Post-operative Plan: Extubation in OR  Informed Consent: I have reviewed the patients History and Physical, chart, labs and discussed the procedure including the risks, benefits and alternatives for the proposed anesthesia with the patient or authorized representative who has indicated his/her understanding and acceptance.   Dental advisory given  Plan Discussed with: CRNA, Anesthesiologist and Surgeon  Anesthesia Plan Comments:        Anesthesia Quick Evaluation

## 2016-09-08 ENCOUNTER — Encounter (HOSPITAL_COMMUNITY)
Admission: RE | Disposition: A | Discharge status: Home / Self Care | Payer: Self-pay | Source: Ambulatory Visit | Attending: Otolaryngology

## 2016-09-08 ENCOUNTER — Ambulatory Visit (HOSPITAL_COMMUNITY): Payer: Medicare Other | Admitting: Anesthesiology

## 2016-09-08 ENCOUNTER — Ambulatory Visit (HOSPITAL_COMMUNITY)
Admission: RE | Admit: 2016-09-08 | Discharge: 2016-09-09 | Disposition: A | Discharge status: Home / Self Care | Payer: Medicare Other | Source: Ambulatory Visit | Attending: Otolaryngology | Admitting: Otolaryngology

## 2016-09-08 ENCOUNTER — Encounter (HOSPITAL_COMMUNITY): Payer: Self-pay | Admitting: *Deleted

## 2016-09-08 DIAGNOSIS — Z90710 Acquired absence of both cervix and uterus: Secondary | ICD-10-CM | POA: Insufficient documentation

## 2016-09-08 DIAGNOSIS — K219 Gastro-esophageal reflux disease without esophagitis: Secondary | ICD-10-CM | POA: Diagnosis not present

## 2016-09-08 DIAGNOSIS — E119 Type 2 diabetes mellitus without complications: Secondary | ICD-10-CM | POA: Insufficient documentation

## 2016-09-08 DIAGNOSIS — I10 Essential (primary) hypertension: Secondary | ICD-10-CM | POA: Insufficient documentation

## 2016-09-08 DIAGNOSIS — E063 Autoimmune thyroiditis: Secondary | ICD-10-CM | POA: Insufficient documentation

## 2016-09-08 DIAGNOSIS — H6121 Impacted cerumen, right ear: Secondary | ICD-10-CM | POA: Diagnosis not present

## 2016-09-08 DIAGNOSIS — E039 Hypothyroidism, unspecified: Secondary | ICD-10-CM | POA: Diagnosis not present

## 2016-09-08 DIAGNOSIS — E042 Nontoxic multinodular goiter: Secondary | ICD-10-CM | POA: Insufficient documentation

## 2016-09-08 DIAGNOSIS — H9191 Unspecified hearing loss, right ear: Secondary | ICD-10-CM | POA: Insufficient documentation

## 2016-09-08 DIAGNOSIS — E890 Postprocedural hypothyroidism: Secondary | ICD-10-CM | POA: Diagnosis present

## 2016-09-08 DIAGNOSIS — Z7984 Long term (current) use of oral hypoglycemic drugs: Secondary | ICD-10-CM | POA: Insufficient documentation

## 2016-09-08 HISTORY — PX: THYROIDECTOMY: SHX17

## 2016-09-08 LAB — GLUCOSE, CAPILLARY
Glucose-Capillary: 103 mg/dL — ABNORMAL HIGH (ref 65–99)
Glucose-Capillary: 147 mg/dL — ABNORMAL HIGH (ref 65–99)
Glucose-Capillary: 202 mg/dL — ABNORMAL HIGH (ref 65–99)

## 2016-09-08 LAB — CALCIUM
Calcium: 8.9 mg/dL (ref 8.9–10.3)
Calcium: 9.3 mg/dL (ref 8.9–10.3)

## 2016-09-08 SURGERY — THYROIDECTOMY
Anesthesia: General | Site: Neck

## 2016-09-08 MED ORDER — LIDOCAINE HCL (CARDIAC) 20 MG/ML IV SOLN
INTRAVENOUS | Status: DC | PRN
  Administered 2016-09-08: 80 mg via INTRAVENOUS

## 2016-09-08 MED ORDER — ONDANSETRON HCL 4 MG/2ML IJ SOLN: 4.0000 mg | INTRAMUSCULAR | Status: DC | PRN

## 2016-09-08 MED ORDER — ACETAMINOPHEN 160 MG/5ML PO SOLN: 650.0000 mg | ORAL | Status: DC | PRN

## 2016-09-08 MED ORDER — 0.9 % SODIUM CHLORIDE (POUR BTL) OPTIME
TOPICAL | Status: DC | PRN
  Administered 2016-09-08: 1000 mL

## 2016-09-08 MED ORDER — HYDROMORPHONE HCL 1 MG/ML IJ SOLN
INTRAMUSCULAR | Status: AC
  Filled 2016-09-08: qty 0.5

## 2016-09-08 MED ORDER — OXYCODONE-ACETAMINOPHEN 5-325 MG PO TABS
1.0000 | ORAL_TABLET | ORAL | Status: DC | PRN
  Administered 2016-09-08 – 2016-09-09 (×3): 2 via ORAL
  Filled 2016-09-08 (×3): qty 2

## 2016-09-08 MED ORDER — ONDANSETRON HCL 4 MG PO TABS: 4.0000 mg | ORAL_TABLET | ORAL | Status: DC | PRN

## 2016-09-08 MED ORDER — FENTANYL CITRATE (PF) 100 MCG/2ML IJ SOLN
INTRAMUSCULAR | Status: AC
  Filled 2016-09-08: qty 2

## 2016-09-08 MED ORDER — HYDROMORPHONE HCL 1 MG/ML IJ SOLN
0.2500 mg | INTRAMUSCULAR | Status: DC | PRN
  Administered 2016-09-08: 0.5 mg via INTRAVENOUS

## 2016-09-08 MED ORDER — DEXAMETHASONE SODIUM PHOSPHATE 10 MG/ML IJ SOLN
INTRAMUSCULAR | Status: AC
  Filled 2016-09-08: qty 1

## 2016-09-08 MED ORDER — SUCCINYLCHOLINE CHLORIDE 20 MG/ML IJ SOLN
INTRAMUSCULAR | Status: DC | PRN
  Administered 2016-09-08: 100 mg via INTRAVENOUS

## 2016-09-08 MED ORDER — SCOPOLAMINE 1 MG/3DAYS TD PT72
1.0000 | MEDICATED_PATCH | TRANSDERMAL | Status: DC
  Administered 2016-09-08: 1.5 mg via TRANSDERMAL
  Filled 2016-09-08: qty 1

## 2016-09-08 MED ORDER — PHENYLEPHRINE HCL 10 MG/ML IJ SOLN
INTRAVENOUS | Status: DC | PRN
  Administered 2016-09-08: 50 ug/min via INTRAVENOUS

## 2016-09-08 MED ORDER — DEXAMETHASONE SODIUM PHOSPHATE 10 MG/ML IJ SOLN
INTRAMUSCULAR | Status: DC | PRN
  Administered 2016-09-08: 10 mg via INTRAVENOUS

## 2016-09-08 MED ORDER — LOSARTAN POTASSIUM-HCTZ 50-12.5 MG PO TABS: 1.0000 | ORAL_TABLET | Freq: Every day | ORAL | Status: DC

## 2016-09-08 MED ORDER — LACTATED RINGERS IV SOLN
INTRAVENOUS | Status: DC | PRN
  Administered 2016-09-08 (×2): via INTRAVENOUS

## 2016-09-08 MED ORDER — ONDANSETRON HCL 4 MG/2ML IJ SOLN
INTRAMUSCULAR | Status: AC
  Filled 2016-09-08: qty 2

## 2016-09-08 MED ORDER — LEVOTHYROXINE SODIUM 75 MCG PO TABS
75.0000 ug | ORAL_TABLET | Freq: Every day | ORAL | Status: DC
  Administered 2016-09-09: 75 ug via ORAL
  Filled 2016-09-08: qty 1

## 2016-09-08 MED ORDER — METFORMIN HCL ER 500 MG PO TB24
500.0000 mg | ORAL_TABLET | Freq: Every day | ORAL | Status: DC
  Administered 2016-09-09: 500 mg via ORAL
  Filled 2016-09-08: qty 1

## 2016-09-08 MED ORDER — CEFAZOLIN SODIUM 1 G IJ SOLR
INTRAMUSCULAR | Status: DC | PRN
  Administered 2016-09-08: 2 g via INTRAMUSCULAR

## 2016-09-08 MED ORDER — LIDOCAINE 2% (20 MG/ML) 5 ML SYRINGE
INTRAMUSCULAR | Status: AC
  Filled 2016-09-08: qty 5

## 2016-09-08 MED ORDER — LOSARTAN POTASSIUM 50 MG PO TABS
50.0000 mg | ORAL_TABLET | Freq: Every day | ORAL | Status: DC
  Administered 2016-09-08 – 2016-09-09 (×2): 50 mg via ORAL
  Filled 2016-09-08 (×2): qty 1

## 2016-09-08 MED ORDER — TRIAMCINOLONE ACETONIDE 40 MG/ML IJ SUSP
INTRAMUSCULAR | Status: DC | PRN
  Administered 2016-09-08: 40 mg via INTRAMUSCULAR

## 2016-09-08 MED ORDER — ACETAMINOPHEN 650 MG RE SUPP: 650.0000 mg | RECTAL | Status: DC | PRN

## 2016-09-08 MED ORDER — SAXAGLIPTIN-METFORMIN ER 5-500 MG PO TB24: 1.0000 | ORAL_TABLET | Freq: Every day | ORAL | Status: DC

## 2016-09-08 MED ORDER — FENTANYL CITRATE (PF) 100 MCG/2ML IJ SOLN
INTRAMUSCULAR | Status: DC | PRN
  Administered 2016-09-08 (×4): 50 ug via INTRAVENOUS
  Administered 2016-09-08: 100 ug via INTRAVENOUS

## 2016-09-08 MED ORDER — CLINDAMYCIN PHOSPHATE 300 MG/50ML IV SOLN
300.0000 mg | Freq: Three times a day (TID) | INTRAVENOUS | Status: AC
  Administered 2016-09-08 – 2016-09-09 (×3): 300 mg via INTRAVENOUS
  Filled 2016-09-08 (×3): qty 50

## 2016-09-08 MED ORDER — SUCCINYLCHOLINE CHLORIDE 200 MG/10ML IV SOSY
PREFILLED_SYRINGE | INTRAVENOUS | Status: AC
  Filled 2016-09-08: qty 10

## 2016-09-08 MED ORDER — PROPOFOL 10 MG/ML IV BOLUS
INTRAVENOUS | Status: AC
  Filled 2016-09-08: qty 20

## 2016-09-08 MED ORDER — DULOXETINE HCL 60 MG PO CPEP
60.0000 mg | ORAL_CAPSULE | Freq: Every day | ORAL | Status: DC
  Administered 2016-09-08 – 2016-09-09 (×2): 60 mg via ORAL
  Filled 2016-09-08 (×2): qty 1

## 2016-09-08 MED ORDER — MIDAZOLAM HCL 2 MG/2ML IJ SOLN
INTRAMUSCULAR | Status: AC
  Filled 2016-09-08: qty 2

## 2016-09-08 MED ORDER — CALCIUM CARBONATE-VITAMIN D 500-200 MG-UNIT PO TABS
2.0000 | ORAL_TABLET | Freq: Two times a day (BID) | ORAL | Status: DC
Start: 2016-09-08 — End: 2016-09-09
  Administered 2016-09-08 – 2016-09-09 (×3): 2 via ORAL
  Filled 2016-09-08 (×3): qty 2

## 2016-09-08 MED ORDER — ONDANSETRON HCL 4 MG/2ML IJ SOLN
INTRAMUSCULAR | Status: DC | PRN
  Administered 2016-09-08: 4 mg via INTRAVENOUS

## 2016-09-08 MED ORDER — LINAGLIPTIN 5 MG PO TABS
5.0000 mg | ORAL_TABLET | Freq: Every day | ORAL | Status: DC
  Administered 2016-09-09: 5 mg via ORAL
  Filled 2016-09-08: qty 1

## 2016-09-08 MED ORDER — HYDROCHLOROTHIAZIDE 12.5 MG PO CAPS
12.5000 mg | ORAL_CAPSULE | Freq: Every day | ORAL | Status: DC
  Administered 2016-09-08 – 2016-09-09 (×2): 12.5 mg via ORAL
  Filled 2016-09-08 (×2): qty 1

## 2016-09-08 MED ORDER — MORPHINE SULFATE (PF) 2 MG/ML IV SOLN
2.0000 mg | INTRAVENOUS | Status: DC | PRN
  Administered 2016-09-08 (×2): 2 mg via INTRAVENOUS
  Filled 2016-09-08 (×2): qty 1

## 2016-09-08 MED ORDER — PRAVASTATIN SODIUM 40 MG PO TABS
40.0000 mg | ORAL_TABLET | Freq: Every day | ORAL | Status: DC
  Administered 2016-09-08: 40 mg via ORAL
  Filled 2016-09-08: qty 1

## 2016-09-08 MED ORDER — PROMETHAZINE HCL 25 MG/ML IJ SOLN: 6.2500 mg | INTRAMUSCULAR | Status: DC | PRN

## 2016-09-08 MED ORDER — MIDAZOLAM HCL 5 MG/5ML IJ SOLN
INTRAMUSCULAR | Status: DC | PRN
  Administered 2016-09-08: 2 mg via INTRAVENOUS

## 2016-09-08 MED ORDER — CEFAZOLIN SODIUM 1 G IJ SOLR
INTRAMUSCULAR | Status: AC
  Filled 2016-09-08: qty 20

## 2016-09-08 MED ORDER — LIDOCAINE-EPINEPHRINE 2 %-1:100000 IJ SOLN
INTRAMUSCULAR | Status: DC | PRN
  Administered 2016-09-08: 20 mL via INTRADERMAL

## 2016-09-08 MED ORDER — LIDOCAINE-EPINEPHRINE 2 %-1:100000 IJ SOLN
INTRAMUSCULAR | Status: AC
Start: 2016-09-08 — End: 2016-09-08
  Filled 2016-09-08: qty 1

## 2016-09-08 MED ORDER — PROPOFOL 10 MG/ML IV BOLUS
INTRAVENOUS | Status: DC | PRN
  Administered 2016-09-08: 20 mg via INTRAVENOUS
  Administered 2016-09-08: 150 mg via INTRAVENOUS

## 2016-09-08 MED ORDER — TRIAMCINOLONE ACETONIDE 40 MG/ML IJ SUSP
INTRAMUSCULAR | Status: AC
  Filled 2016-09-08: qty 5

## 2016-09-08 SURGICAL SUPPLY — 52 items
ADH SKN CLS APL DERMABOND .7 (GAUZE/BANDAGES/DRESSINGS) ×1
ATTRACTOMAT 16X20 MAGNETIC DRP (DRAPES) IMPLANT
BLADE SURG 15 STRL LF DISP TIS (BLADE) IMPLANT
BLADE SURG 15 STRL SS (BLADE)
BLADE SURG CLIPPER 3M 9600 (MISCELLANEOUS) IMPLANT
CANISTER SUCT 3000ML PPV (MISCELLANEOUS) ×2 IMPLANT
CLEANER TIP ELECTROSURG 2X2 (MISCELLANEOUS) ×2 IMPLANT
CLIP TI WIDE RED SMALL 24 (CLIP) IMPLANT
CONT SPEC 4OZ CLIKSEAL STRL BL (MISCELLANEOUS) ×1 IMPLANT
CORDS BIPOLAR (ELECTRODE) ×2 IMPLANT
COVER SURGICAL LIGHT HANDLE (MISCELLANEOUS) ×2 IMPLANT
CRADLE DONUT ADULT HEAD (MISCELLANEOUS) ×2 IMPLANT
DERMABOND ADVANCED (GAUZE/BANDAGES/DRESSINGS) ×1
DERMABOND ADVANCED .7 DNX12 (GAUZE/BANDAGES/DRESSINGS) ×1 IMPLANT
DRAIN CHANNEL 10F 3/8 F FF (DRAIN) ×2 IMPLANT
DRAPE PROXIMA HALF (DRAPES) ×2 IMPLANT
ELECT COATED BLADE 2.86 ST (ELECTRODE) ×2 IMPLANT
ELECT REM PT RETURN 9FT ADLT (ELECTROSURGICAL) ×2
ELECTRODE REM PT RTRN 9FT ADLT (ELECTROSURGICAL) ×1 IMPLANT
EVACUATOR SILICONE 100CC (DRAIN) ×1 IMPLANT
FORCEPS BIPOLAR SPETZLER 8 1.0 (NEUROSURGERY SUPPLIES) ×2 IMPLANT
GAUZE SPONGE 4X4 16PLY XRAY LF (GAUZE/BANDAGES/DRESSINGS) ×4 IMPLANT
GLOVE BIO SURGEON STRL SZ 6.5 (GLOVE) ×2 IMPLANT
GLOVE BIOGEL PI IND STRL 6.5 (GLOVE) ×1 IMPLANT
GLOVE BIOGEL PI INDICATOR 6.5 (GLOVE) ×1
GLOVE ECLIPSE 7.5 STRL STRAW (GLOVE) ×2 IMPLANT
GOWN STRL REUS W/ TWL LRG LVL3 (GOWN DISPOSABLE) ×3 IMPLANT
GOWN STRL REUS W/ TWL XL LVL3 (GOWN DISPOSABLE) IMPLANT
GOWN STRL REUS W/TWL LRG LVL3 (GOWN DISPOSABLE) ×4
GOWN STRL REUS W/TWL XL LVL3 (GOWN DISPOSABLE) ×2
HEMOSTAT SURGICEL 2X14 (HEMOSTASIS) IMPLANT
KIT BASIN OR (CUSTOM PROCEDURE TRAY) ×2 IMPLANT
KIT ROOM TURNOVER OR (KITS) ×2 IMPLANT
MARKER SKIN DUAL TIP RULER LAB (MISCELLANEOUS) ×1 IMPLANT
NDL HYPO 25GX1X1/2 BEV (NEEDLE) ×1 IMPLANT
NEEDLE HYPO 25GX1X1/2 BEV (NEEDLE) ×4 IMPLANT
NS IRRIG 1000ML POUR BTL (IV SOLUTION) ×2 IMPLANT
PAD ARMBOARD 7.5X6 YLW CONV (MISCELLANEOUS) ×3 IMPLANT
PENCIL BUTTON HOLSTER BLD 10FT (ELECTRODE) ×2 IMPLANT
PROBE NERVBE PRASS .33 (MISCELLANEOUS) ×3 IMPLANT
SHEARS HARMONIC 9CM CVD (BLADE) ×2 IMPLANT
SPECIMEN JAR MEDIUM (MISCELLANEOUS) ×1 IMPLANT
SPONGE INTESTINAL PEANUT (DISPOSABLE) ×2 IMPLANT
SUT ETHILON 2 0 FS 18 (SUTURE) ×2 IMPLANT
SUT SILK 2 0 FS (SUTURE) ×2 IMPLANT
SUT SILK 3 0 REEL (SUTURE) ×2 IMPLANT
SUT VICRYL 4-0 PS2 18IN ABS (SUTURE) ×3 IMPLANT
SYR 5ML LL (SYRINGE) ×1 IMPLANT
TAPE CLOTH 4X10 WHT NS (GAUZE/BANDAGES/DRESSINGS) ×1 IMPLANT
TRAY ENT MC OR (CUSTOM PROCEDURE TRAY) ×2 IMPLANT
TRAY FOLEY CATH 14FRSI W/METER (CATHETERS) IMPLANT
TUBE ENDOTRAC EMG 7X10.2 (MISCELLANEOUS) ×1 IMPLANT

## 2016-09-08 NOTE — H&P (Signed)
Cc: Thyroid goiter, swallowing difficulty, hearing loss  HPI: The patient is a 66 year old female who presents today complaining of an enlarging thyroid goiter.  Her thyroid goiter has slowly enlarged over the past several years.  The patient has noted intermittent dysphagia.  She attributes her dysphagia to the compressive effect of her thyroid goiter.  She denies any significant odynophagia, dyspnea, or dysphonia.  In addition, the patient also complains of fluctuating right ear hearing loss for the past week.  She denies any significant otalgia, otorrhea or vertigo.  She has no previous history of ENT surgery.  The patient is currently on Synthroid to treat her hypothyroidism.   The patient's review of systems (constitutional, eyes, ENT, cardiovascular, respiratory, GI, musculoskeletal, skin, neurologic, psychiatric, endocrine, hematologic, allergic) is noted in the ROS questionnaire.  It is reviewed with the patient.  Family health history: Heart disease,cancer.  Major events: Hysterectomy, knee surgery.  Ongoing medical problems: Thyroid disease, diabetes, hypertension.  Social history: The patient is married. She denies the use of alcohol , tobacco or illegal drugs.   Objective General: Communicates without difficulty, well nourished, no acute distress. Head: Normocephalic, no evidence injury, no tenderness, facial buttresses intact without stepoff. Face/sinus: No tenderness to palpation and percussion. Facial movement is normal and symmetric. Eyes: PERRL, EOMI. No scleral icterus, conjunctivae clear. Neuro: CN II exam reveals vision grossly intact.  No nystagmus at any point of gaze. Auricles: Intact without lesions. EAC: Right ear cerumen impaction.  Under the operating microscope, the cerumen is carefully removed with a combination of cerumen currette, alligator forceps, and suction catheters.  After the cerumen is removed, the TMs are noted to be normal.  No mass, erythema, or lesions. Nose:  External evaluation reveals normal support and skin without lesions.  Dorsum is intact.  Anterior rhinoscopy reveals healthy pink mucosa over anterior aspect of inferior turbinates and intact septum.  No purulence noted. Oral:  Oral cavity and oropharynx are intact, symmetric, without erythema or edema.  Mucosa is moist without lesions. Neck: Full range of motion without pain.  There is no significant lymphadenopathy.  No masses palpable.  Thyroid bed diffusely enlarged.  Parotid glands and submandibular glands equal bilaterally without mass.  Trachea is midline. Neuro:  CN 2-12 grossly intact. Gait normal.   Procedure:  Flexible Fiberoptic Laryngoscopy Risks, benefits, and alternatives of flexible endoscopy were explained to the patient.  Specific mention was made of the risk of throat numbness with difficulty swallowing, possible bleeding from the nose and mouth, and pain from the procedure.  The patient gave oral consent to proceed.  The nasal cavities were decongested and anesthetised with a combination of oxymetazoline and 4% lidocaine solution.  The flexible scope was inserted into the right nasal cavity and advanced towards the nasopharynx.  Visualized mucosa over the turbinates and septum were as described above.  The nasopharynx was clear.  Oropharyngeal walls were symmetric and mobile without lesion, mass, or edema.  Hypopharynx was also without  lesion or edema.  Larynx was mobile without lesions. Supraglottic structures were free of edema, mass, and asymmetry.  True vocal folds were mobile bilaterally.  Base of tongue was within normal limits.   AUDIOMETRIC TESTING:  I reviewed the audiometric result. The test shows bilateral mild high-frequency sensorineural hearing loss. The speech reception threshold is 10dB AD and 10dB AS. The discrimination score is 96% AD and 92% AS. The tympanogram is normal bilaterally.   Assessment 1.  Right ear cerumen impaction.  After the disimpaction  procedure, the  patient's ear canals, tympanic membranes and middle ear spaces are all normal.   2.  Bilateral mild high frequency sensorineural hearing loss, likely secondary to routine presbycusis.  3.  Multinodular thyroid goiter.  The patient's thyroid gland is noted to be diffusely enlarged bilaterally. She is complaining of compressive symptoms due to her thyroid goiter.  4.  The patient's vocal cords are noted to be mobile bilaterally.    Plan  1.  Otomicroscopy with right ear cerumen disimpaction.  2.  The physical exam findings and the hearing test results are reviewed with the patient.  3.  The treatment options of thyroid goiter include conservative observation versus thyroidectomy surgery.  The risks, benefits, alternatives and details of the treatment options are extensively discussed.  4.  The patient would like to proceed with the thyroidectomy procedure.

## 2016-09-08 NOTE — Op Note (Signed)
DATE OF PROCEDURE:  09/08/2016                              OPERATIVE REPORT  SURGEON:  [name], MD  ASST.: Rometta Emery, PA-C  PREOPERATIVE DIAGNOSES: 1. Large thyroid goiter.  POSTOPERATIVE DIAGNOSES: 1. Large thyroid goiter.  PROCEDURE PERFORMED:  Total thyroidectomy  ANESTHESIA:  General endotracheal tube anesthesia.  COMPLICATIONS:  None.  ESTIMATED BLOOD LOSS:  Less than 165ml  INDICATION FOR PROCEDURE:  Christina Hernandez is a 66 y.o. female with a history of a large thyroid goiter. It has resulted in significant compressive symptoms. The patient complained of constant dysphagia. Based on the above findings, the decision was made for the patient to undergo the total thyroidectomy procedure. Likelihood of success in reducing symptoms was also discussed.  The risks, benefits, alternatives, and details of the procedure were discussed with the patient. The patient also has a history of keloid formation. The decision was therefore made to perform Kenalog injection at the incision site.  Questions were invited and answered.  Informed consent was obtained.  DESCRIPTION:  The patient was taken to the operating room and placed supine on the operating table.  General endotracheal tube anesthesia was administered by the anesthesiologist.  The patient was positioned and prepped and draped in a standard fashion for thyroidectomy surgery. The NIMS nerve monitoring electrodes were placed. The monitoring system was functional throughout the case.  2% lidocaine with 1-100,000 epinephrine was injected at the planned site of incision. A transverse lower neck incision was made. The incision was carried down to the level of the platysma muscles. Superior and inferiorly based subplatysmal flaps were elevated in the standard fashion. The strap muscles were divided at midline and retracted laterally, exposing a large thyroid goiter. It should be noted that Rometta Emery, PA-C assisted in soft tissue  retraction and dissection throughout the case.   Attention was first focused on the left thyroid lobe. The left thyroid lobe was carefully dissected free from the surrounding soft tissue. The recurrent laryngeal nerve was identified and preserved. An inferior parathyroid gland was also identified and preserved. The undersurface of the thyroid gland was noted to be significantly adhered to the tracheal cartilage. 2 pieces of thyroid tissue was sent to the pathology department for frozen section analysis. The pathology was suggestive of thyroiditis. No obvious malignancy was noted. The same procedure was repeated on the right side. The entire thyroid gland was removed and sent to the pathology department.  The surgical site was copiously irrigated. A #10 JP drain was placed. The strap muscles were reapproximated using 4-0 Vicryl sutures. The incision was closed with 4-0 Vicryl sutures and Dermabond. In order to prevent keloid formation, Kenalog 40 was injected at the incision site.  The care of the patient was turned over to the anesthesiologist.  The patient was awakened from anesthesia without difficulty.  The patient was extubated and transferred to the recovery room in good condition.  OPERATIVE FINDINGS: Severely enlarge thyroid goiter   SPECIMEN: Total thyroid specimen   FOLLOWUP CARE:  The patient will be observed overnight in the hospital. Her calcium level will be monitored.   Syed Zukas W Alsha Meland 09/08/2016 11:50 AM

## 2016-09-08 NOTE — Anesthesia Postprocedure Evaluation (Addendum)
Anesthesia Post Note  Patient: Christina Hernandez  Procedure(s) Performed: Procedure(s) (LRB): TOTAL THYROIDECTOMY (N/A)  Patient location during evaluation: PACU Anesthesia Type: General Level of consciousness: sedated Pain management: pain level controlled Vital Signs Assessment: post-procedure vital signs reviewed and stable Respiratory status: spontaneous breathing and respiratory function stable Cardiovascular status: stable Anesthetic complications: no       Last Vitals:  Vitals:   09/08/16 1226 09/08/16 1241  BP: 139/76 139/78  Pulse: (!) 102 (!) 101  Resp: 13 15  Temp:      Last Pain:  Vitals:   09/08/16 1254  TempSrc:   PainSc: 3                  Lynasia Meloche DANIEL

## 2016-09-08 NOTE — Anesthesia Procedure Notes (Addendum)
Procedure Name: Intubation Date/Time: 09/08/2016 8:36 AM Performed by: [name] Pre-anesthesia Checklist: Patient identified, Emergency Drugs available, Suction available and Patient being monitored Patient Re-evaluated:Patient Re-evaluated prior to inductionOxygen Delivery Method: Circle system utilized Preoxygenation: Pre-oxygenation with 100% oxygen Intubation Type: IV induction and Cricoid Pressure applied Ventilation: Mask ventilation without difficulty Laryngoscope Size: Mac and 3 Grade View: Grade I Tube type: Oral Tube size: 7.0 (NIMS ETT used.) mm Number of attempts: 1 Airway Equipment and Method: Stylet Placement Confirmation: ETT inserted through vocal cords under direct vision,  positive ETCO2 and breath sounds checked- equal and bilateral Secured at: 21 cm Tube secured with: Tape Dental Injury: Teeth and Oropharynx as per pre-operative assessment

## 2016-09-08 NOTE — Transfer of Care (Signed)
Immediate Anesthesia Transfer of Care Note  Patient: Christina Hernandez  Procedure(s) Performed: Procedure(s): TOTAL THYROIDECTOMY (N/A)  Patient Location: PACU  Anesthesia Type:General  Level of Consciousness: awake, alert  and oriented  Airway & Oxygen Therapy: Patient Spontanous Breathing and Patient connected to nasal cannula oxygen  Post-op Assessment: Report given to RN, Post -op Vital signs reviewed and stable and Patient moving all extremities X 4  Post vital signs: Reviewed and stable  Last Vitals:  Vitals:   09/08/16 0724 09/08/16 1156  BP: 132/60   Pulse: 88   Resp: 20   Temp: 36.6 C 36.8 C    Last Pain:  Vitals:   09/08/16 1156  TempSrc:   PainSc: Asleep         Complications: No apparent anesthesia complications

## 2016-09-09 ENCOUNTER — Encounter (HOSPITAL_COMMUNITY): Payer: Self-pay | Admitting: Otolaryngology

## 2016-09-09 DIAGNOSIS — E042 Nontoxic multinodular goiter: Secondary | ICD-10-CM | POA: Diagnosis not present

## 2016-09-09 LAB — CALCIUM: Calcium: 9.4 mg/dL (ref 8.9–10.3)

## 2016-09-09 MED ORDER — OXYCODONE-ACETAMINOPHEN 5-325 MG PO TABS: 1.0000 | ORAL_TABLET | ORAL | 0 refills | Status: DC | PRN

## 2016-09-09 MED ORDER — CALCIUM CARBONATE-VITAMIN D 500-200 MG-UNIT PO TABS: 2.0000 | ORAL_TABLET | Freq: Every day | ORAL | 3 refills | Status: DC

## 2016-09-09 NOTE — Discharge Summary (Signed)
Physician Discharge Summary  Patient ID: Christina Hernandez MRN: [id number] DOB/AGE: [birth year] 68 y.o.  Admit date: 09/08/2016 Discharge date: 09/09/2016  Admission Diagnoses: Thyroid goiter  Discharge Diagnoses: Thyroid goiter Active Problems:   H/O total thyroidectomy   Discharged Condition: good  Hospital Course: Pt had an uneventful overnight stay. Pt tolerated po well. No bleeding. No stridor.  Consults: None  Significant Diagnostic Studies: None  Treatments: surgery: Total thyroidectomy  Discharge Exam: Blood pressure (!) 124/58, pulse 67, temperature 98.1 F (36.7 C), temperature source Oral, resp. rate 17, weight 175 lb (79.4 kg), SpO2 93 %. Incision/Wound: c/d/i  Disposition: 01-Home or Self Care  Discharge Instructions    Activity as tolerated - No restrictions    Complete by:  As directed    Diet general    Complete by:  As directed      Allergies as of 09/09/2016      Reactions   Cyclobenzaprine Nausea Only      Medication List    STOP taking these medications   Cholecalciferol 5000 units Tabs     TAKE these medications   acetaminophen 500 MG tablet Commonly known as:  TYLENOL Take 1 tablet (500 mg total) by mouth every 8 (eight) hours as needed.   Biotin 5000 MCG Caps Take 1 capsule by mouth daily.   calcium-vitamin D 500-200 MG-UNIT tablet Commonly known as:  OSCAL WITH D Take 2 tablets by mouth daily with breakfast.   cetirizine 10 MG tablet Commonly known as:  ZYRTEC Take 1 tablet (10 mg total) by mouth daily. What changed:  when to take this  reasons to take this   DULoxetine 60 MG capsule Commonly known as:  CYMBALTA Take 1 capsule (60 mg total) by mouth daily.   KOMBIGLYZE XR 5-500 MG Tb24 Generic drug:  Saxagliptin-Metformin Take 1 tablet by mouth daily.   levothyroxine 75 MCG tablet Commonly known as:  SYNTHROID, LEVOTHROID Take 75 mcg by mouth daily.   losartan-hydrochlorothiazide 50-12.5 MG tablet Commonly known  as:  HYZAAR Take 1 tablet by mouth daily.   multivitamin with minerals Tabs tablet Take 1 tablet by mouth daily.   NONFORMULARY OR COMPOUNDED ITEM Compounded cream for right knee as needed   oxyCODONE-acetaminophen 5-325 MG tablet Commonly known as:  PERCOCET/ROXICET Take 1-2 tablets by mouth every 4 (four) hours as needed for severe pain.   pravastatin 40 MG tablet Commonly known as:  PRAVACHOL Take 40 mg by mouth daily.      Follow-up Information    Burley Saver, MD. Go on 09/15/2016.   Specialty:  Otolaryngology Why:  As scheduled Contact information: [address] [place] [address] [phone]           Signed: Burley Saver 09/09/2016, 9:00 AM

## 2016-09-09 NOTE — Discharge Instructions (Signed)
Thyroidectomy, Care After Refer to this sheet in the next few weeks. These instructions provide you with information about caring for yourself after your procedure. Your health care provider may also give you more specific instructions. Your treatment has been planned according to current medical practices, but problems sometimes occur. Call your health care provider if you have any problems or questions after your procedure. What can I expect after the procedure? After your procedure, it is typical to have:  Mild pain in the neck or upper body, especially when swallowing.  A sore throat.  A weak voice. Follow these instructions at home:  Take medicines only as directed by your health care provider.  If your entire thyroid gland was removed, you may need to take thyroid hormone medicine from now on.  Do not take medicines that contain aspirin and ibuprofen until your health care provider says that you can. These medicines can increase your risk of bleeding.  Some pain medicines cause constipation. Drink enough fluid to keep your urine clear or pale yellow. This can help to prevent constipation.  Resume your usual activities as directed by your health care provider.  For the first 10 days after the procedure or as instructed by your health care provider:  Do not lift anything heavier than 20 lb (9.1 kg).  Do not jog, swim, or do other strenuous exercises.  Do not play contact sports.  Keep all follow-up visits as directed by your health care provider. This is important. Contact a health care provider if:  The soreness in your throat gets worse.  You have increased pain at your incision or incisions.  You have increased bleeding from an incision.  Your incision becomes infected. Watch for:  Swelling.  Redness.  Warmth.  Pus.  You notice a bad smell coming from an incision or dressing.  You have a fever.  You feel lightheaded or faint.  You have numbness, tingling,  or muscle spasms in your:  Arms.  Hands.  Feet.  Face.  You have trouble swallowing. Get help right away if:  You develop a rash.  You have difficulty breathing.  You hear whistling noises coming from your chest.  You develop a cough that gets worse.  Your speech changes, or you have hoarseness that gets worse. This information is not intended to replace advice given to you by your health care provider. Make sure you discuss any questions you have with your health care provider. Document Released: 01/01/2005 Document Revised: 02/15/2016 Document Reviewed: 11/14/2013 Elsevier Interactive Patient Education  2017 [employer].

## 2016-09-09 NOTE — Progress Notes (Signed)
Discharge paperwork given to patient. No questions verbalized. IV removed. Patient is ready for discharge.

## 2016-09-22 ENCOUNTER — Ambulatory Visit (INDEPENDENT_AMBULATORY_CARE_PROVIDER_SITE_OTHER): Payer: Medicare Other | Admitting: Pulmonary Disease

## 2016-09-22 ENCOUNTER — Encounter: Payer: Self-pay | Admitting: Pulmonary Disease

## 2016-09-22 VITALS — BP 132/72 | HR 107 | Temp 97.9°F | Ht 65.0 in | Wt 167.6 lb

## 2016-09-22 DIAGNOSIS — R911 Solitary pulmonary nodule: Secondary | ICD-10-CM

## 2016-09-22 NOTE — Patient Instructions (Signed)
I reviewed the CT scan with you today. Order a follow-up CT without contrast in 8 months time to make sure it is not changing in size  For sinus congestion and nasal discharge use over-the-counter antihistamine, steroid nasal spray and saline nasal spray. Continue to use Mucinex for congestion  Please go to the emergency room if his symptoms worsen  Return to clinic to CT scan.

## 2016-09-22 NOTE — Progress Notes (Signed)
Christina Hernandez    [id number]    11-26-1950  Primary Care Physician:Robyn Shawnie Dapper, MD  Referring Physician: Glendale Chard, MD [address] [place], [place] [address]  Chief complaint: Consult for evaluation of lung nodule  HPI: 66 year old with medical history of thyroid goiter, diabetes, GERD, hypertension. She underwent total thyroidectomy on 3/15. She had a CT neck in preparation for this in January which showed a right upper lobe lung nodule. A follow-up CT of the chest did demonstrate that the right upper lobe lung nodule she has been referred here for further evaluation next and she is a lifelong nonsmoker but has been exposed to secondhand cigarette smoke. She has history of any sedatives, allergies, postnasal drip. 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 She denies any cough, sputum production, dyspnea, wheezing. Her surgery incision is clean and dry with no discharge. She does not report any neck swelling, tenderness, erythema. She's had chills at home but no reported fevers.  Outpatient Encounter Prescriptions as of 09/22/2016  Medication Sig  . acetaminophen (TYLENOL) 500 MG tablet Take 1 tablet (500 mg total) by mouth every 8 (eight) hours as needed.  . Biotin 5000 MCG CAPS Take 1 capsule by mouth daily.   . calcium-vitamin D (OSCAL WITH D) 500-200 MG-UNIT tablet Take 2 tablets by mouth daily with breakfast.  . cetirizine (ZYRTEC) 10 MG tablet Take 1 tablet (10 mg total) by mouth daily. (Patient taking differently: Take 10 mg by mouth daily as needed for allergies. )  . DULoxetine (CYMBALTA) 60 MG capsule Take 1 capsule (60 mg total) by mouth daily.  Marland Kitchen levothyroxine (SYNTHROID, LEVOTHROID) 75 MCG tablet Take 75 mcg by mouth daily.    Marland Kitchen losartan-hydrochlorothiazide (HYZAAR) 50-12.5 MG per tablet Take 1 tablet by mouth daily.    . Multiple  Vitamin (MULTIVITAMIN WITH MINERALS) TABS tablet Take 1 tablet by mouth daily.  . NONFORMULARY OR COMPOUNDED ITEM Compounded cream for right knee as needed  . oxyCODONE-acetaminophen (PERCOCET/ROXICET) 5-325 MG tablet Take 1-2 tablets by mouth every 4 (four) hours as needed for severe pain.  . pravastatin (PRAVACHOL) 40 MG tablet Take 40 mg by mouth daily.  . Saxagliptin-Metformin (KOMBIGLYZE XR) 5-500 MG TB24 Take 1 tablet by mouth daily.    No facility-administered encounter medications on file as of 09/22/2016.     Allergies as of 09/22/2016 - Review Complete 09/22/2016  Allergen Reaction Noted  . Cyclobenzaprine Nausea Only 12/17/2015    Past Medical History:  Diagnosis Date  . Diabetes ([place])    type 2  . GERD (gastroesophageal reflux disease)    rarely occurs- no meds  . Hypertension   . Hypothyroidism   . Shortness of breath    pt states sob on exertion because she is "out of shape"    Past Surgical History:  Procedure Laterality Date  . COLONOSCOPY    . cyber knife Left 04/08/2016  . HYSTEROSCOPY W/D&C  05/24/2011   Procedure: DILATATION AND CURETTAGE (D&C) /HYSTEROSCOPY;  Surgeon: [name], MD;  Location: [facility];  Service: Gynecology;  Laterality: N/A;  . KNEE SURGERY Right 06/10/15   torn ligament  . THYROIDECTOMY N/A 09/08/2016   Procedure: TOTAL THYROIDECTOMY;  Surgeon: [name], MD;  Location: [facility];  Service: ENT;  Laterality: N/A;  . TUBAL LIGATION    . uterine polyp  Family History  Problem Relation Age of Onset  . Hypertension Mother   . Heart disease Mother   . Kidney failure Mother   . Heart Problems Father   . Heart Problems Sister   . Cardiomyopathy Sister   . Ovarian cancer Sister     Social History   Social History  . Marital status: Married    Spouse name: [name]  . Number of children: 4  . Years of education: BS   Occupational History  . Retired Other   Social History Main Topics  . Smoking status: Never Smoker  . Smokeless  tobacco: Never Used  . Alcohol use No  . Drug use: No  . Sexual activity: Not on file   Other Topics Concern  . Not on file   Social History Narrative   Patient lives at home with family.   Caffeine Use: occasionally   Review of systems: Review of Systems  Constitutional: Negative for fever and chills.  HENT: Negative.   Eyes: Negative for blurred vision.  Respiratory: as per HPI  Cardiovascular: Negative for chest pain and palpitations.  Gastrointestinal: Negative for vomiting, diarrhea, blood per rectum. Genitourinary: Negative for dysuria, urgency, frequency and hematuria.  Musculoskeletal: Negative for myalgias, back pain and joint pain.  Skin: Negative for itching and rash.  Neurological: Negative for dizziness, tremors, focal weakness, seizures and loss of consciousness.  Endo/Heme/Allergies: Negative for environmental allergies.  Psychiatric/Behavioral: Negative for depression, suicidal ideas and hallucinations.  All other systems reviewed and are negative.  Physical Exam: Blood pressure 132/72, pulse (!) 107, height 5\' 5"  (1.651 m), weight 167 lb 9.6 oz (76 kg), SpO2 99 %, T 97.9 Gen:      No acute distress HEENT:  EOMI, sclera anicteric Neck:     No masses; no thyromegaly Lungs:    Clear to auscultation bilaterally; normal respiratory effort CV:         Regular rate and rhythm; no murmurs Abd:      + bowel sounds; soft, non-tender; no palpable masses, no distension Ext:    No edema; adequate peripheral perfusion Skin:      Warm and dry; no rash Neuro: alert and oriented x 3 Psych: normal mood and affect  Data Reviewed: CT neck 07/26/16-right upper lobe nodule 7 x 8 mm in size CT scan chest 2/8/1 8-6 millimeter right upper lobe nodule, mild bibasilar atelectasis. I have reviewed all images personally.  Assessment:  RUL nodule.  Although she is a nonsmoker she has been exposed to secondhand smoke. She does not have any other exposures. We will reevaluate by  getting a follow-up CT scan of the chest later this year.  Acute sinusitis, rhinitis She will use OTC antihistamines, Flonase spray and saline nasal spray. The site of thyroidectomy does not appear infected. I have asked her to seek care in emergency room if symptoms worsen or if she develops fevers.   Plan/Recommendations: - Follow up CT scan chest  [name] MD [facility] Pager [phone] 09/22/2016, 12:10 PM  CC: Glendale Chard, MD

## 2016-11-24 ENCOUNTER — Encounter (HOSPITAL_COMMUNITY): Payer: Self-pay | Admitting: Family Medicine

## 2016-11-24 ENCOUNTER — Ambulatory Visit (HOSPITAL_COMMUNITY)
Admission: EM | Admit: 2016-11-24 | Discharge: 2016-11-24 | Disposition: A | Discharge status: Home / Self Care | Payer: Medicare Other | Attending: Emergency Medicine | Admitting: Emergency Medicine

## 2016-11-24 DIAGNOSIS — I10 Essential (primary) hypertension: Secondary | ICD-10-CM

## 2016-11-24 DIAGNOSIS — J0100 Acute maxillary sinusitis, unspecified: Secondary | ICD-10-CM | POA: Diagnosis not present

## 2016-11-24 DIAGNOSIS — R030 Elevated blood-pressure reading, without diagnosis of hypertension: Secondary | ICD-10-CM

## 2016-11-24 MED ORDER — AMOXICILLIN-POT CLAVULANATE 875-125 MG PO TABS: 1.0000 | ORAL_TABLET | Freq: Two times a day (BID) | ORAL | 0 refills | Status: AC

## 2016-11-24 MED ORDER — BENZONATATE 100 MG PO CAPS: 100.0000 mg | ORAL_CAPSULE | Freq: Three times a day (TID) | ORAL | 0 refills | Status: DC

## 2016-11-24 NOTE — ED Provider Notes (Signed)
CSN: [id number]     Arrival date & time 11/24/16  1430 History   None    Chief Complaint  Patient presents with  . Headache  . Cough  . Nasal Congestion   (Consider location/radiation/quality/duration/timing/severity/associated sxs/prior Treatment) The history is provided by the patient. No language interpreter was used.  URI  Presenting symptoms: congestion, cough, facial pain and sore throat   Presenting symptoms comment:  Headache Severity:  Moderate Onset quality:  Gradual Duration:  1 week Timing:  Constant Progression:  Worsening Chronicity:  New Relieved by:  Nothing Worsened by:  Certain positions Ineffective treatments:  OTC medications Associated symptoms: headaches and sinus pain   Risk factors: being elderly     Past Medical History:  Diagnosis Date  . Diabetes ([place])    type 2  . GERD (gastroesophageal reflux disease)    rarely occurs- no meds  . Hypertension   . Hypothyroidism   . Shortness of breath    pt states sob on exertion because she is "out of shape"   Past Surgical History:  Procedure Laterality Date  . COLONOSCOPY    . cyber knife Left 04/08/2016  . HYSTEROSCOPY W/D&C  05/24/2011   Procedure: DILATATION AND CURETTAGE (D&C) /HYSTEROSCOPY;  Surgeon: [name], MD;  Location: [facility];  Service: Gynecology;  Laterality: N/A;  . KNEE SURGERY Right 06/10/15   torn ligament  . THYROIDECTOMY N/A 09/08/2016   Procedure: TOTAL THYROIDECTOMY;  Surgeon: [name], MD;  Location: [facility];  Service: ENT;  Laterality: N/A;  . TUBAL LIGATION    . uterine polyp     Family History  Problem Relation Age of Onset  . Hypertension Mother   . Heart disease Mother   . Kidney failure Mother   . Heart Problems Father   . Heart Problems Sister   . Cardiomyopathy Sister   . Ovarian cancer Sister    Social History  Substance Use Topics  . Smoking status: Never Smoker  . Smokeless tobacco: Never Used  . Alcohol use No   OB History    No data available      Review of Systems  Constitutional: Positive for chills.  HENT: Positive for congestion, sinus pain and sore throat.   Eyes: Negative.   Respiratory: Positive for cough.   Cardiovascular: Negative.   Gastrointestinal: Negative.   Endocrine: Negative.   Genitourinary: Negative.   Musculoskeletal: Negative.   Allergic/Immunologic: Negative.   Neurological: Positive for headaches.  Hematological: Negative.   Psychiatric/Behavioral: Negative.   All other systems reviewed and are negative.   Allergies  Cyclobenzaprine  Home Medications   Prior to Admission medications   Medication Sig Start Date End Date Taking? Authorizing Provider  acetaminophen (TYLENOL) 500 MG tablet Take 1 tablet (500 mg total) by mouth every 8 (eight) hours as needed. 6/96/29   Chitanand, Alicia B, DO  amoxicillin-clavulanate (AUGMENTIN) 875-125 MG tablet Take 1 tablet by mouth every 12 (twelve) hours. 11/23/39 08/28/42  Delorus Langwell, Jeanett Schlein, NP  benzonatate (TESSALON) 100 MG capsule Take 1 capsule (100 mg total) by mouth every 8 (eight) hours. 0/10/27   Graysyn Bache, Jeanett Schlein, NP  Biotin 5000 MCG CAPS Take 1 capsule by mouth daily.     [provider]  calcium-vitamin D (OSCAL WITH D) 500-200 MG-UNIT tablet Take 2 tablets by mouth daily with breakfast. 09/09/16   [name], MD  cetirizine (ZYRTEC) 10 MG tablet Take 1 tablet (10 mg total) by mouth daily. Patient taking differently: Take 10 mg by mouth daily  as needed for allergies.  10/29/14   Tereasa Coop, PA-C  DULoxetine (CYMBALTA) 60 MG capsule Take 1 capsule (60 mg total) by mouth daily. 12/17/15   Penumalli, Earlean Polka, MD  levothyroxine (SYNTHROID, LEVOTHROID) 75 MCG tablet Take 75 mcg by mouth daily.      [provider]  losartan-hydrochlorothiazide (HYZAAR) 50-12.5 MG per tablet Take 1 tablet by mouth daily.      [provider]  Multiple Vitamin (MULTIVITAMIN WITH MINERALS) TABS tablet Take 1 tablet by mouth daily.    [provider]  NONFORMULARY OR COMPOUNDED ITEM Compounded cream for right knee as needed    [provider]  oxyCODONE-acetaminophen (PERCOCET/ROXICET) 5-325 MG tablet Take 1-2 tablets by mouth every 4 (four) hours as needed for severe pain. 09/09/16   [name], MD  pravastatin (PRAVACHOL) 40 MG tablet Take 40 mg by mouth daily.    [provider]  Saxagliptin-Metformin (KOMBIGLYZE XR) 5-500 MG TB24 Take 1 tablet by mouth daily.     [provider]   Meds Ordered and Administered this Visit  Medications - No data to display  BP (!) 153/90   Pulse (!) 101   Temp 97.8 F (36.6 C)   Resp 18   SpO2 97%  No data found.   Physical Exam  Constitutional: She is oriented to person, place, and time. She appears well-developed and well-nourished. She is active and cooperative. No distress.  HENT:  Head: Normocephalic.  Right Ear: Tympanic membrane is retracted.  Left Ear: Tympanic membrane is retracted.  Nose: Mucosal edema and sinus tenderness present. Right sinus exhibits maxillary sinus tenderness. Left sinus exhibits maxillary sinus tenderness.  Mouth/Throat: Uvula is midline and oropharynx is clear and moist.  Eyes: Conjunctivae, EOM and lids are normal. Pupils are equal, round, and reactive to light.  Neck: Normal range of motion. No tracheal deviation present.  Cardiovascular: Regular rhythm, normal heart sounds and normal pulses.   No murmur heard. Pulmonary/Chest: Effort normal and breath sounds normal.  Abdominal: Soft. Bowel sounds are normal. There is no tenderness.  Musculoskeletal: Normal range of motion.  Lymphadenopathy:    She has no cervical adenopathy.  Neurological: She is alert and oriented to person, place, and time. GCS eye subscore is 4. GCS verbal subscore is 5. GCS motor subscore is 6.  Skin: Skin is warm and dry. No rash noted.  Psychiatric: She has a normal mood and affect. Her speech is normal and behavior is normal.  Nursing note and  vitals reviewed.   Urgent Care Course     Procedures (including critical care time)  Labs Review Labs Reviewed - No data to display  Imaging Review No results found.        MDM   1. Acute non-recurrent maxillary sinusitis   2. Elevated blood pressure reading    1525: pt has hx of htn: Discussed plan of care: Rest,push fluids, take abx as directed. Follow up with PCP in 2 days for BP recheck as it was elevated in office today. Go to Er for new or worsening issues(chest pain, shortness of breath, etc). Return to UC as needed.  Pt verbalized understanding to this provider.    Tori Milks, NP 51/76/16 [phone]

## 2016-11-24 NOTE — ED Triage Notes (Signed)
Pt here for URI symptoms.  

## 2016-11-24 NOTE — Discharge Instructions (Signed)
Rest,push fluids, take abx as directed. Follow up with PCP in 2 days for BP recheck as it was elevated in office today. Go to Er for new or worsening issues(chest pain, shortness of breath, etc). Return to UC as needed.

## 2016-11-27 NOTE — Addendum Note (Signed)
Addendum  created 11/27/16 1112 by Duane Boston, MD   Sign clinical note

## 2016-12-26 ENCOUNTER — Other Ambulatory Visit: Payer: Self-pay | Admitting: Diagnostic Neuroimaging

## 2017-02-04 ENCOUNTER — Other Ambulatory Visit (INDEPENDENT_AMBULATORY_CARE_PROVIDER_SITE_OTHER): Payer: Self-pay | Admitting: Otolaryngology

## 2017-02-04 DIAGNOSIS — R911 Solitary pulmonary nodule: Secondary | ICD-10-CM

## 2017-03-30 ENCOUNTER — Other Ambulatory Visit: Payer: Self-pay | Admitting: Obstetrics and Gynecology

## 2017-04-25 ENCOUNTER — Other Ambulatory Visit: Payer: Self-pay | Admitting: Internal Medicine

## 2017-04-25 DIAGNOSIS — N6320 Unspecified lump in the left breast, unspecified quadrant: Secondary | ICD-10-CM

## 2017-04-29 ENCOUNTER — Ambulatory Visit
Admission: RE | Admit: 2017-04-29 | Discharge: 2017-04-29 | Disposition: A | Discharge status: Home / Self Care | Payer: Medicare Other | Source: Ambulatory Visit | Attending: Internal Medicine | Admitting: Internal Medicine

## 2017-04-29 ENCOUNTER — Ambulatory Visit: Admission: RE | Admit: 2017-04-29 | Payer: Medicare Other | Source: Ambulatory Visit

## 2017-04-29 DIAGNOSIS — N6320 Unspecified lump in the left breast, unspecified quadrant: Secondary | ICD-10-CM

## 2017-05-25 ENCOUNTER — Ambulatory Visit (INDEPENDENT_AMBULATORY_CARE_PROVIDER_SITE_OTHER)
Admission: RE | Admit: 2017-05-25 | Discharge: 2017-05-25 | Disposition: A | Discharge status: Home / Self Care | Payer: Medicare Other | Source: Ambulatory Visit | Attending: Pulmonary Disease | Admitting: Pulmonary Disease

## 2017-05-25 DIAGNOSIS — R911 Solitary pulmonary nodule: Secondary | ICD-10-CM

## 2017-06-08 ENCOUNTER — Other Ambulatory Visit: Payer: Self-pay

## 2017-06-08 DIAGNOSIS — R918 Other nonspecific abnormal finding of lung field: Secondary | ICD-10-CM

## 2017-08-21 ENCOUNTER — Other Ambulatory Visit: Payer: Self-pay | Admitting: Diagnostic Neuroimaging

## 2018-02-02 LAB — CBC AND DIFFERENTIAL
HCT: 38 (ref 36–46)
Hemoglobin: 12.2 (ref 12.0–16.0)
WBC: 5.1

## 2018-02-02 LAB — BASIC METABOLIC PANEL
Glucose: 93
Potassium: 3.9 (ref 3.4–5.3)
Sodium: 144 (ref 137–147)

## 2018-02-02 LAB — TSH: TSH: 0.17 — AB (ref 0.41–5.90)

## 2018-02-02 LAB — HEPATIC FUNCTION PANEL
ALT: 22 (ref 7–35)
AST: 17 (ref 13–35)
Alkaline Phosphatase: 107 (ref 25–125)

## 2018-02-02 LAB — HEMOGLOBIN A1C: Hemoglobin A1C: 6.2

## 2018-03-24 ENCOUNTER — Telehealth: Payer: Self-pay | Admitting: General Practice

## 2018-03-24 NOTE — Telephone Encounter (Signed)
Called patient to try and schedule her an appt at her request. No VM set up so no message was left.  If patient calls back, please schedule her at her convenience for knee pain.  Thank you!

## 2018-03-27 ENCOUNTER — Ambulatory Visit: Payer: Medicare Other | Admitting: Family Medicine

## 2018-03-27 ENCOUNTER — Other Ambulatory Visit: Payer: Self-pay

## 2018-03-27 ENCOUNTER — Ambulatory Visit (INDEPENDENT_AMBULATORY_CARE_PROVIDER_SITE_OTHER): Payer: Medicare Other

## 2018-03-27 ENCOUNTER — Encounter: Payer: Self-pay | Admitting: Family Medicine

## 2018-03-27 ENCOUNTER — Ambulatory Visit: Payer: Medicare Other

## 2018-03-27 VITALS — BP 145/80 | HR 100 | Temp 98.1°F | Resp 16 | Ht 65.0 in | Wt 176.0 lb

## 2018-03-27 DIAGNOSIS — Z23 Encounter for immunization: Secondary | ICD-10-CM | POA: Diagnosis not present

## 2018-03-27 DIAGNOSIS — M25562 Pain in left knee: Principal | ICD-10-CM

## 2018-03-27 DIAGNOSIS — E119 Type 2 diabetes mellitus without complications: Secondary | ICD-10-CM

## 2018-03-27 DIAGNOSIS — G8929 Other chronic pain: Secondary | ICD-10-CM

## 2018-03-27 DIAGNOSIS — M170 Bilateral primary osteoarthritis of knee: Secondary | ICD-10-CM

## 2018-03-27 DIAGNOSIS — M25561 Pain in right knee: Secondary | ICD-10-CM | POA: Diagnosis not present

## 2018-03-27 MED ORDER — DICLOFENAC SODIUM 75 MG PO TBEC: 75.0000 mg | DELAYED_RELEASE_TABLET | Freq: Two times a day (BID) | ORAL | 0 refills | Status: DC

## 2018-03-27 NOTE — Progress Notes (Signed)
Patient ID: Christina Hernandez, female    DOB: 06/03/1951  Age: 67 y.o. MRN: [id number]  Chief Complaint  Patient presents with  . Knee Pain    both/ x 2 wks, pt states her knees feel achy.    Subjective:   67 year old lady with a history of bilateral knee pain.  A couple of years ago she had arthroscopic surgery of her right knee.  Both hurt her now, but the left is worse than the right.  No major injury.  She just came on and keeps hurting her.  She does not do any regular physical exercise.  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  She is on diabetic medication as well as on gabapentin for a facial neurologic problem.  Current allergies, medications, problem list, past/family and social histories reviewed.  Objective:  BP (!) 145/80   Pulse 100   Temp 98.1 F (36.7 C) (Oral)   Resp 16   Ht 5\' 5"  (1.651 m)   Wt 176 lb (79.8 kg)   SpO2 96%   BMI 29.29 kg/m   No major acute distress.  Knees are not grossly swollen.  There is no palpable effusion in either knee.  Both have crepitance, the left more than the right.  No ankle edema.  Pulses good.  Assessment & Plan:   Assessment: 1. Chronic pain of both knees   2. Primary osteoarthritis of both knees   3. Need for influenza vaccination       Plan: We will get x-rays of her knees to make sure nothing is out of the ordinary on them, but explained to her that wear and tear arthritis occurs with age.  See instructions.  Orders Placed This Encounter  Procedures  . DG Knee 1-2 Views Left    Standing Status:   Future    Standing Expiration Date:   03/27/2019    Order Specific Question:   Reason for Exam (SYMPTOM  OR DIAGNOSIS REQUIRED)    Answer:   knee pain bilateral    Order Specific Question:   Preferred imaging location?    Answer:   External  . DG Knee 1-2 Views Right    Standing Status:   Future    Standing Expiration Date:   03/27/2019    Order Specific Question:   Reason for Exam (SYMPTOM  OR DIAGNOSIS  REQUIRED)    Answer:   knee pain bilateral    Order Specific Question:   Preferred imaging location?    Answer:   External  . Flu vaccine HIGH DOSE PF    No orders of the defined types were placed in this encounter.        Patient Instructions       If you have lab work done today you will be contacted with your lab results within the next 2 weeks.  If you have not heard from [place] then please contact us. The fastest way to get your results is to register for My Chart.   IF you received an x-ray today, you will receive an invoice from [facility]. Please contact [facility] at [phone] with questions or concerns regarding your invoice.   IF you received labwork today, you will receive an invoice from [facility]. Please contact [facility] at [phone] with questions or concerns regarding your invoice.   Our billing staff will not be able to assist you with questions regarding bills from these companies.  You will be contacted with the lab results  as soon as they are available. The fastest way to get your results is to activate your My Chart account. Instructions are located on the last page of this paperwork. If you have not heard from [place] regarding the results in 2 weeks, please contact this office.     ]    No follow-ups on file.   Ruben Reason, MD 03/27/2018

## 2018-03-27 NOTE — Patient Instructions (Addendum)
Take diclofenac 75 mg. Twice daily for 2 weeks for knees..  Take with food.    Because you are diabetic I have held off on giving the cortisone into the knee for now.  It is intense to raise your blood sugar, however if you keep having the pain after trying the diclofenac for a couple of weeks you should probably consider getting a shot of cortisone.  This can be done here, by your primary care, or at her orthopedic office.  You should not continue the diclofenac long-term, but if you keep eating a little relief you can take Tylenol 500 mg (acetaminophen) 2 tablets maximum of 3 times daily for your joint pain.  Return as needed.   Osteoarthritis Osteoarthritis is a type of arthritis that affects tissue that covers the ends of bones in joints (cartilage). Cartilage acts as a cushion between the bones and helps them move smoothly. Osteoarthritis results when cartilage in the joints gets worn down. Osteoarthritis is sometimes called "wear and tear" arthritis. Osteoarthritis is the most common form of arthritis. It often occurs in older people. It is a condition that gets worse over time (a progressive condition). Joints that are most often affected by this condition are in:  Fingers.  Toes.  Hips.  Knees.  Spine, including neck and lower back.  What are the causes? This condition is caused by age-related wearing down of cartilage that covers the ends of bones. What increases the risk? The following factors may make you more likely to develop this condition:  Older age.  Being overweight or obese.  Overuse of joints, such as in athletes.  Past injury of a joint.  Past surgery on a joint.  Family history of osteoarthritis.  What are the signs or symptoms? The main symptoms of this condition are pain, swelling, and stiffness in the joint. The joint may lose its shape over time. Small pieces of bone or cartilage may break off and float inside of the joint, which may cause more pain  and damage to the joint. Small deposits of bone (osteophytes) may grow on the edges of the joint. Other symptoms may include:  A grating or scraping feeling inside the joint when you move it.  Popping or creaking sounds when you move.  Symptoms may affect one or more joints. Osteoarthritis in a major joint, such as your knee or hip, can make it painful to walk or exercise. If you have osteoarthritis in your hands, you might not be able to grip items, twist your hand, or control small movements of your hands and fingers (fine motor skills). How is this diagnosed? This condition may be diagnosed based on:  Your medical history.  A physical exam.  Your symptoms.  X-rays of the affected joint(s).  Blood tests to rule out other types of arthritis.  How is this treated? There is no cure for this condition, but treatment can help to control pain and improve joint function. Treatment plans may include:  A prescribed exercise program that allows for rest and joint relief. You may work with a physical therapist.  A weight control plan.  Pain relief techniques, such as: ? Applying heat and cold to the joint. ? Electric pulses delivered to nerve endings under the skin (transcutaneous electrical nerve stimulation, or TENS). ? Massage. ? Certain nutritional supplements.  NSAIDs or prescription medicines to help relieve pain.  Medicine to help relieve pain and inflammation (corticosteroids). This can be given by mouth (orally) or as an injection.  Assistive devices, such as a brace, wrap, splint, specialized glove, or cane.  Surgery, such as: ? An osteotomy. This is done to reposition the bones and relieve pain or to remove loose pieces of bone and cartilage. ? Joint replacement surgery. You may need this surgery if you have very bad (advanced) osteoarthritis.  Follow these instructions at home: Activity  Rest your affected joints as directed by your health care provider.  Do not  drive or use heavy machinery while taking prescription pain medicine.  Exercise as directed. Your health care provider or physical therapist may recommend specific types of exercise, such as: ? Strengthening exercises. These are done to strengthen the muscles that support joints that are affected by arthritis. They can be performed with weights or with exercise bands to add resistance. ? Aerobic activities. These are exercises, such as brisk walking or water aerobics, that get your heart pumping. ? Range-of-motion activities. These keep your joints easy to move. ? Balance and agility exercises. Managing pain, stiffness, and swelling  If directed, apply heat to the affected area as often as told by your health care provider. Use the heat source that your health care provider recommends, such as a moist heat pack or a heating pad. ? If you have a removable assistive device, remove it as told by your health care provider. ? Place a towel between your skin and the heat source. If your health care provider tells you to keep the assistive device on while you apply heat, place a towel between the assistive device and the heat source. ? Leave the heat on for 20-30 minutes. ? Remove the heat if your skin turns bright red. This is especially important if you are unable to feel pain, heat, or cold. You may have a greater risk of getting burned.  If directed, put ice on the affected joint: ? If you have a removable assistive device, remove it as told by your health care provider. ? Put ice in a plastic bag. ? Place a towel between your skin and the bag. If your health care provider tells you to keep the assistive device on during icing, place a towel between the assistive device and the bag. ? Leave the ice on for 20 minutes, 2-3 times a day. General instructions  Take over-the-counter and prescription medicines only as told by your health care provider.  Maintain a healthy weight. Follow instructions  from your health care provider for weight control. These may include dietary restrictions.  Do not use any products that contain nicotine or tobacco, such as cigarettes and e-cigarettes. These can delay bone healing. If you need help quitting, ask your health care provider.  Use assistive devices as directed by your health care provider.  Keep all follow-up visits as told by your health care provider. This is important. Where to find more information:  [employer] of Arthritis and Musculoskeletal and Skin Diseases: www.niams.SouthExposed.es  [employer] on Aging: http://kim-miller.com/  American College of Rheumatology: www.rheumatology.org Contact a health care provider if:  Your skin turns red.  You develop a rash.  You have pain that gets worse.  You have a fever along with joint or muscle aches. Get help right away if:  You lose a lot of weight.  You suddenly lose your appetite.  You have night sweats. Summary  Osteoarthritis is a type of arthritis that affects tissue covering the ends of bones in joints (cartilage).  This condition is caused by age-related wearing down of cartilage that  covers the ends of bones.  The main symptom of this condition is pain, swelling, and stiffness in the joint.  There is no cure for this condition, but treatment can help to control pain and improve joint function. This information is not intended to replace advice given to you by your health care provider. Make sure you discuss any questions you have with your health care provider. Document Released: 06/14/2005 Document Revised: 02/16/2016 Document Reviewed: 02/16/2016 Elsevier Interactive Patient Education  [employer].   If you have lab work done today you will be contacted with your lab results within the next 2 weeks.  If you have not heard from [place] then please contact us. The fastest way to get your results is to register for My Chart.   IF you received an x-ray today, you  will receive an invoice from [facility]. Please contact [facility] at [phone] with questions or concerns regarding your invoice.   IF you received labwork today, you will receive an invoice from [facility]. Please contact [facility] at [phone] with questions or concerns regarding your invoice.   Our billing staff will not be able to assist you with questions regarding bills from these companies.  You will be contacted with the lab results as soon as they are available. The fastest way to get your results is to activate your My Chart account. Instructions are located on the last page of this paperwork. If you have not heard from [place] regarding the results in 2 weeks, please contact this office.     ]

## 2018-04-11 ENCOUNTER — Other Ambulatory Visit: Payer: Self-pay | Admitting: Nurse Practitioner

## 2018-04-23 ENCOUNTER — Other Ambulatory Visit: Payer: Self-pay | Admitting: Family Medicine

## 2018-04-23 DIAGNOSIS — M170 Bilateral primary osteoarthritis of knee: Secondary | ICD-10-CM

## 2018-04-24 ENCOUNTER — Other Ambulatory Visit: Payer: Self-pay | Admitting: Internal Medicine

## 2018-04-27 ENCOUNTER — Other Ambulatory Visit: Payer: Self-pay | Admitting: Nurse Practitioner

## 2018-04-29 ENCOUNTER — Encounter: Payer: Self-pay | Admitting: Internal Medicine

## 2018-05-10 ENCOUNTER — Encounter: Payer: Self-pay | Admitting: Internal Medicine

## 2018-05-10 ENCOUNTER — Ambulatory Visit: Payer: Medicare Other

## 2018-05-10 ENCOUNTER — Ambulatory Visit (INDEPENDENT_AMBULATORY_CARE_PROVIDER_SITE_OTHER): Payer: Medicare Other | Admitting: Internal Medicine

## 2018-05-10 VITALS — BP 138/80 | HR 103 | Temp 97.6°F | Ht 64.0 in | Wt 178.0 lb

## 2018-05-10 VITALS — BP 138/80 | HR 103 | Temp 97.7°F | Ht 64.0 in | Wt 178.0 lb

## 2018-05-10 DIAGNOSIS — I10 Essential (primary) hypertension: Secondary | ICD-10-CM | POA: Insufficient documentation

## 2018-05-10 DIAGNOSIS — E1165 Type 2 diabetes mellitus with hyperglycemia: Secondary | ICD-10-CM | POA: Diagnosis not present

## 2018-05-10 DIAGNOSIS — G500 Trigeminal neuralgia: Secondary | ICD-10-CM | POA: Diagnosis not present

## 2018-05-10 DIAGNOSIS — Z1239 Encounter for other screening for malignant neoplasm of breast: Secondary | ICD-10-CM

## 2018-05-10 DIAGNOSIS — R252 Cramp and spasm: Secondary | ICD-10-CM | POA: Diagnosis not present

## 2018-05-10 DIAGNOSIS — E7800 Pure hypercholesterolemia, unspecified: Secondary | ICD-10-CM | POA: Insufficient documentation

## 2018-05-10 DIAGNOSIS — Z6830 Body mass index (BMI) 30.0-30.9, adult: Secondary | ICD-10-CM

## 2018-05-10 DIAGNOSIS — Z0001 Encounter for general adult medical examination with abnormal findings: Secondary | ICD-10-CM

## 2018-05-10 DIAGNOSIS — H25013 Cortical age-related cataract, bilateral: Secondary | ICD-10-CM

## 2018-05-10 DIAGNOSIS — M170 Bilateral primary osteoarthritis of knee: Secondary | ICD-10-CM

## 2018-05-10 DIAGNOSIS — Z23 Encounter for immunization: Secondary | ICD-10-CM | POA: Diagnosis not present

## 2018-05-10 DIAGNOSIS — Z0000 Encounter for general adult medical examination without abnormal findings: Secondary | ICD-10-CM | POA: Diagnosis not present

## 2018-05-10 DIAGNOSIS — E039 Hypothyroidism, unspecified: Secondary | ICD-10-CM

## 2018-05-10 LAB — POCT UA - MICROALBUMIN
Albumin/Creatinine Ratio, Urine, POC: 30
Creatinine, POC: 300 mg/dL
Microalbumin Ur, POC: 10 mg/L

## 2018-05-10 LAB — POCT URINALYSIS DIPSTICK
Bilirubin, UA: NEGATIVE
Blood, UA: NEGATIVE
Glucose, UA: NEGATIVE
Ketones, UA: NEGATIVE
Leukocytes, UA: NEGATIVE
Nitrite, UA: NEGATIVE
Protein, UA: NEGATIVE
Spec Grav, UA: <=1.005 — AB (ref 1.010–1.025)
Urobilinogen, UA: 0.2 E.U./dL
pH, UA: 5.5 (ref 5.0–8.0)

## 2018-05-10 NOTE — Patient Instructions (Signed)
Christina Hernandez , Thank you for taking time to come for your Medicare Wellness Visit. I appreciate your ongoing commitment to your health goals. Please review the following plan we discussed and let me know if I can assist you in the future.   Screening recommendations/referrals: Colonoscopy: up to date Mammogram: pt. to schedule Bone Density: up to date Recommended yearly ophthalmology/optometry visit for glaucoma screening and checkup Recommended yearly dental visit for hygiene and checkup  Vaccinations: Influenza vaccine: 02/2018 Pneumococcal vaccine: prevnar 13 sent to pharmacy Tdap vaccine: 04/2011 Shingles vaccine: dec    Advanced directives: Advance directive discussed with you today. I have provided a copy for you to complete at home and have notarized. Once this is complete please bring a copy in to our office so we can scan it into your chart.   Conditions/risks identified: Obesity: patient wants to start exercising regularly and increase water intake.  Next appointment: 08/10/2018 at 11:00a   Preventive Care 65 Years and Older, Female Preventive care refers to lifestyle choices and visits with your health care provider that can promote health and wellness. What does preventive care include?  A yearly physical exam. This is also called an annual well check.  Dental exams once or twice a year.  Routine eye exams. Ask your health care provider how often you should have your eyes checked.  Personal lifestyle choices, including:  Daily care of your teeth and gums.  Regular physical activity.  Eating a healthy diet.  Avoiding tobacco and drug use.  Limiting alcohol use.  Practicing safe sex.  Taking low-dose aspirin every day.  Taking vitamin and mineral supplements as recommended by your health care provider. What happens during an annual well check? The services and screenings done by your health care provider during your annual well check will depend on your age,  overall health, lifestyle risk factors, and family history of disease. Counseling  Your health care provider may ask you questions about your:  Alcohol use.  Tobacco use.  Drug use.  Emotional well-being.  Home and relationship well-being.  Sexual activity.  Eating habits.  History of falls.  Memory and ability to understand (cognition).  Work and work Statistician.  Reproductive health. Screening  You may have the following tests or measurements:  Height, weight, and BMI.  Blood pressure.  Lipid and cholesterol levels. These may be checked every 5 years, or more frequently if you are over 36 years old.  Skin check.  Lung cancer screening. You may have this screening every year starting at age 53 if you have a 30-pack-year history of smoking and currently smoke or have quit within the past 15 years.  Fecal occult blood test (FOBT) of the stool. You may have this test every year starting at age 41.  Flexible sigmoidoscopy or colonoscopy. You may have a sigmoidoscopy every 5 years or a colonoscopy every 10 years starting at age 43.  Hepatitis C blood test.  Hepatitis B blood test.  Sexually transmitted disease (STD) testing.  Diabetes screening. This is done by checking your blood sugar (glucose) after you have not eaten for a while (fasting). You may have this done every 1-3 years.  Bone density scan. This is done to screen for osteoporosis. You may have this done starting at age 73.  Mammogram. This may be done every 1-2 years. Talk to your health care provider about how often you should have regular mammograms. Talk with your health care provider about your test results, treatment options, and  if necessary, the need for more tests. Vaccines  Your health care provider may recommend certain vaccines, such as:  Influenza vaccine. This is recommended every year.  Tetanus, diphtheria, and acellular pertussis (Tdap, Td) vaccine. You may need a Td booster every 10  years.  Zoster vaccine. You may need this after age 52.  Pneumococcal 13-valent conjugate (PCV13) vaccine. One dose is recommended after age 82.  Pneumococcal polysaccharide (PPSV23) vaccine. One dose is recommended after age 40. Talk to your health care provider about which screenings and vaccines you need and how often you need them. This information is not intended to replace advice given to you by your health care provider. Make sure you discuss any questions you have with your health care provider. Document Released: 07/11/2015 Document Revised: 03/03/2016 Document Reviewed: 04/15/2015 Elsevier Interactive Patient Education  2017 [place] Prevention in the Home Falls can cause injuries. They can happen to people of all ages. There are many things you can do to make your home safe and to help prevent falls. What can I do on the outside of my home?  Regularly fix the edges of walkways and driveways and fix any cracks.  Remove anything that might make you trip as you walk through a door, such as a raised step or threshold.  Trim any bushes or trees on the path to your home.  Use bright outdoor lighting.  Clear any walking paths of anything that might make someone trip, such as rocks or tools.  Regularly check to see if handrails are loose or broken. Make sure that both sides of any steps have handrails.  Any raised decks and porches should have guardrails on the edges.  Have any leaves, snow, or ice cleared regularly.  Use sand or salt on walking paths during winter.  Clean up any spills in your garage right away. This includes oil or grease spills. What can I do in the bathroom?  Use night lights.  Install grab bars by the toilet and in the tub and shower. Do not use towel bars as grab bars.  Use non-skid mats or decals in the tub or shower.  If you need to sit down in the shower, use a plastic, non-slip stool.  Keep the floor dry. Clean up any water that  spills on the floor as soon as it happens.  Remove soap buildup in the tub or shower regularly.  Attach bath mats securely with double-sided non-slip rug tape.  Do not have throw rugs and other things on the floor that can make you trip. What can I do in the bedroom?  Use night lights.  Make sure that you have a light by your bed that is easy to reach.  Do not use any sheets or blankets that are too big for your bed. They should not hang down onto the floor.  Have a firm chair that has side arms. You can use this for support while you get dressed.  Do not have throw rugs and other things on the floor that can make you trip. What can I do in the kitchen?  Clean up any spills right away.  Avoid walking on wet floors.  Keep items that you use a lot in easy-to-reach places.  If you need to reach something above you, use a strong step stool that has a grab bar.  Keep electrical cords out of the way.  Do not use floor polish or wax that makes floors slippery. If you  must use wax, use non-skid floor wax.  Do not have throw rugs and other things on the floor that can make you trip. What can I do with my stairs?  Do not leave any items on the stairs.  Make sure that there are handrails on both sides of the stairs and use them. Fix handrails that are broken or loose. Make sure that handrails are as long as the stairways.  Check any carpeting to make sure that it is firmly attached to the stairs. Fix any carpet that is loose or worn.  Avoid having throw rugs at the top or bottom of the stairs. If you do have throw rugs, attach them to the floor with carpet tape.  Make sure that you have a light switch at the top of the stairs and the bottom of the stairs. If you do not have them, ask someone to add them for you. What else can I do to help prevent falls?  Wear shoes that:  Do not have high heels.  Have rubber bottoms.  Are comfortable and fit you well.  Are closed at the  toe. Do not wear sandals.  If you use a stepladder:  Make sure that it is fully opened. Do not climb a closed stepladder.  Make sure that both sides of the stepladder are locked into place.  Ask someone to hold it for you, if possible.  Clearly mark and make sure that you can see:  Any grab bars or handrails.  First and last steps.  Where the edge of each step is.  Use tools that help you move around (mobility aids) if they are needed. These include:  Canes.  Walkers.  Scooters.  Crutches.  Turn on the lights when you go into a dark area. Replace any light bulbs as soon as they burn out.  Set up your furniture so you have a clear path. Avoid moving your furniture around.  If any of your floors are uneven, fix them.  If there are any pets around you, be aware of where they are.  Review your medicines with your doctor. Some medicines can make you feel dizzy. This can increase your chance of falling. Ask your doctor what other things that you can do to help prevent falls. This information is not intended to replace advice given to you by your health care provider. Make sure you discuss any questions you have with your health care provider. Document Released: 04/10/2009 Document Revised: 11/20/2015 Document Reviewed: 07/19/2014 Elsevier Interactive Patient Education  2017 [employer].

## 2018-05-10 NOTE — Progress Notes (Signed)
Subjective:   Christina Hernandez is a 66 y.o. female who presents for Medicare Annual (Subsequent) preventive examination.  Review of Systems:  n/a Cardiac Risk Factors include: advanced age (>66men, >106 women);diabetes mellitus;hypertension;obesity (BMI >30kg/m2);sedentary lifestyle     Objective:     Vitals: BP 138/80 (BP Location: Left Arm)   Pulse (!) 103   Temp 97.6 F (36.4 C)   Ht 5\' 4"  (1.626 m)   Wt 178 lb (80.7 kg)   SpO2 97%   BMI 30.55 kg/m   Body mass index is 30.55 kg/m.  Advanced Directives 05/10/2018 09/01/2016 05/18/2011  Does Patient Have a Medical Advance Directive? No No Patient does not have advance directive  Would patient like information on creating a medical advance directive? Yes (MAU/Ambulatory/Procedural Areas - Information given) No - Patient declined -    Tobacco Social History   Tobacco Use  Smoking Status Never Smoker  Smokeless Tobacco Never Used     Counseling given: Not Answered   Clinical Intake:  Pre-visit preparation completed: Yes  Pain : No/denies pain Pain Score: 0-No pain     Nutritional Status: BMI > 30  Obese Diabetes: Yes CBG done?: No Did pt. bring in CBG monitor from home?: No  How often do you need to have someone help you when you read instructions, pamphlets, or other written materials from your doctor or pharmacy?: 1 - Never What is the last grade level you completed in school?: BS degree  Interpreter Needed?: No  Information entered by :: NAllen LPN  Past Medical History:  Diagnosis Date  . Diabetes ([place])    type 2  . GERD (gastroesophageal reflux disease)    rarely occurs- no meds  . Hypertension   . Hypothyroidism   . Shortness of breath    pt states sob on exertion because she is "out of shape"   Past Surgical History:  Procedure Laterality Date  . COLONOSCOPY    . cyber knife Left 04/08/2016  . HYSTEROSCOPY W/D&C  05/24/2011   Procedure: DILATATION AND CURETTAGE (D&C) /HYSTEROSCOPY;   Surgeon: [name], MD;  Location: [facility];  Service: Gynecology;  Laterality: N/A;  . KNEE SURGERY Right 06/10/15   torn ligament  . THYROIDECTOMY N/A 09/08/2016   Procedure: TOTAL THYROIDECTOMY;  Surgeon: [name], MD;  Location: [facility];  Service: ENT;  Laterality: N/A;  . TUBAL LIGATION    . uterine polyp     Family History  Problem Relation Age of Onset  . Hypertension Mother   . Heart disease Mother   . Kidney failure Mother   . Heart Problems Father   . Heart Problems Sister   . Cardiomyopathy Sister   . Ovarian cancer Sister    Social History   Socioeconomic History  . Marital status: Married    Spouse name: [name]  . Number of children: 4  . Years of education: BS  . Highest education level: Not on file  Occupational History  . Occupation: Retired    Fish farm manager: OTHER  Social Needs  . Financial resource strain: Not hard at all  . Food insecurity:    Worry: Never true    Inability: Never true  . Transportation needs:    Medical: No    Non-medical: No  Tobacco Use  . Smoking status: Never Smoker  . Smokeless tobacco: Never Used  Substance and Sexual Activity  . Alcohol use: No  . Drug use: No  . Sexual activity: Not Currently  Lifestyle  .  Physical activity:    Days per week: 0 days    Minutes per session: 0 min  . Stress: Only a little  Relationships  . Social connections:    Talks on phone: Not on file    Gets together: Not on file    Attends religious service: Not on file    Active member of club or organization: Not on file    Attends meetings of clubs or organizations: Not on file    Relationship status: Not on file  Other Topics Concern  . Not on file  Social History Narrative   Patient lives at home with family.   Caffeine Use: occasionally    Outpatient Encounter Medications as of 05/10/2018  Medication Sig  . acetaminophen (TYLENOL) 500 MG tablet Take 1 tablet (500 mg total) by mouth every 8 (eight) hours as needed.  . Biotin 5000 MCG CAPS  Take 1 capsule by mouth daily.   . cetirizine (ZYRTEC) 10 MG tablet Take 1 tablet (10 mg total) by mouth daily. (Patient taking differently: Take 10 mg by mouth daily as needed for allergies. )  . DULoxetine (CYMBALTA) 60 MG capsule TAKE 1 CAPSULE BY MOUTH EVERY DAY  . gabapentin (NEURONTIN) 300 MG capsule Take 300 mg by mouth 3 (three) times daily.  Marland Kitchen ibuprofen (ADVIL,MOTRIN) 400 MG tablet Take by mouth.  Marland Kitchen KOMBIGLYZE XR 5-500 MG TB24 TAKE 1 TABLET BY MOUTH EVERY DAY IN THE EVENING WITH DINNER  . levothyroxine (SYNTHROID, LEVOTHROID) 75 MCG tablet Take 75 mcg by mouth daily.    Marland Kitchen losartan-hydrochlorothiazide (HYZAAR) 50-12.5 MG per tablet Take 1 tablet by mouth daily.    . Multiple Vitamin (MULTIVITAMIN WITH MINERALS) TABS tablet Take 1 tablet by mouth daily.  . pravastatin (PRAVACHOL) 40 MG tablet Take 40 mg by mouth daily.  . diclofenac (VOLTAREN) 75 MG EC tablet Take 1 tablet (75 mg total) by mouth 2 (two) times daily. (Patient not taking: Reported on 05/10/2018)  . NONFORMULARY OR COMPOUNDED ITEM Compounded cream for right knee as needed  . oxyCODONE-acetaminophen (PERCOCET/ROXICET) 5-325 MG tablet Take 1-2 tablets by mouth every 4 (four) hours as needed for severe pain. (Patient not taking: Reported on 03/27/2018)   No facility-administered encounter medications on file as of 05/10/2018.     Activities of Daily Living In your present state of health, do you have any difficulty performing the following activities: 05/10/2018  Hearing? Y  Comment thinks due to ear wax  Vision? N  Difficulty concentrating or making decisions? N  Walking or climbing stairs? N  Dressing or bathing? N  Doing errands, shopping? N  Preparing Food and eating ? N  Using the Toilet? N  In the past six months, have you accidently leaked urine? N  Do you have problems with loss of bowel control? N  Managing your Medications? N  Managing your Finances? N  Housekeeping or managing your Housekeeping? N  Some  recent data might be hidden    Patient Care Team: Glendale Chard, MD as PCP - General (Internal Medicine) [name], MD as Consulting Physician (Obstetrics and Gynecology)    Assessment:   This is a routine wellness examination for Christina Hernandez.  Exercise Activities and Dietary recommendations Current Exercise Habits: The patient does not participate in regular exercise at present, Exercise limited by: None identified  Goals    . DIET - INCREASE WATER INTAKE (pt-stated)    . Exercise 150 min/wk Moderate Activity (pt-stated)       Fall Risk Fall  Risk  05/10/2018 03/27/2018 04/12/2016 03/23/2016 12/17/2015  Falls in the past year? 0 No No No No  Risk for fall due to : Medication side effect - - - -   Is the patient's home free of loose throw rugs in walkways, pet beds, electrical cords, etc?   yes      Grab bars in the bathroom? yes      Handrails on the stairs?   yes      Adequate lighting?   yes  Timed Get Up and Go performed: n/a  Depression Screen PHQ 2/9 Scores 05/10/2018 03/27/2018 04/12/2016 03/23/2016  PHQ - 2 Score 0 0 0 0     Cognitive Function     6CIT Screen 05/10/2018  What Year? 0 points  What month? 0 points  What time? 0 points  Count back from 20 0 points  Months in reverse 0 points  Repeat phrase 0 points  Total Score 0    Immunization History  Administered Date(s) Administered  . Influenza, High Dose Seasonal PF 03/27/2018  . Influenza,inj,Quad PF,6+ Mos 04/12/2016  . Pneumococcal Polysaccharide-23 05/25/2016  . Tdap 05/28/2011    Qualifies for Shingles Vaccine? yes  Screening Tests Health Maintenance  Topic Date Due  . Hepatitis C Screening  June 17, 1951  . FOOT EXAM  05/29/1959  . OPHTHALMOLOGY EXAM  05/29/1959  . PNA vac Low Risk Adult (1 of 2 - PCV13) 05/28/2014  . HEMOGLOBIN A1C  08/05/2018  . MAMMOGRAM  04/30/2019  . TETANUS/TDAP  05/27/2021  . COLONOSCOPY  02/08/2027  . INFLUENZA VACCINE  Completed  . DEXA SCAN  Completed     Cancer Screenings: Lung: Low Dose CT Chest recommended if Age 6-80 years, 30 pack-year currently smoking OR have quit w/in 15years. Patient does not qualify. Breast:  Up to date on Mammogram? No   Up to date of Bone Density/Dexa? Yes Colorectal: up to date  Additional Screenings: : Hepatitis C Screening:  due     Plan:   Prevnar 13 sent to pharmacy for administration. Patient to call and set up appointment for next mammogram. States that she  Had her eye exam completed in February [facility].  I have personally reviewed and noted the following in the patient's chart:   . Medical and social history . Use of alcohol, tobacco or illicit drugs  . Current medications and supplements . Functional ability and status . Nutritional status . Physical activity . Advanced directives . List of other physicians . Hospitalizations, surgeries, and ER visits in previous 12 months . Vitals . Screenings to include cognitive, depression, and falls . Referrals and appointments  In addition, I have reviewed and discussed with patient certain preventive protocols, quality metrics, and best practice recommendations. A written personalized care plan for preventive services as well as general preventive health recommendations were provided to patient.     Kellie Simmering, LPN  [id number]

## 2018-05-10 NOTE — Progress Notes (Addendum)
Subjective:     Patient ID: Christina Hernandez , female    DOB: 1950-10-15 , 67 y.o.   MRN: [id number]   CC- I need a physical  HPI She is here for yearly physical, wants her ears checked and referral to neurologist, ortho, and ophthalmology. Has not kept FU with her neurologist after the [facility] treatment she received and now states the area of tingling is larger. Has OA of both knees and has seen ortho years ago who did injections, both are bothering her and is ready to go back to see him. She cant recall the name. She is also due to FU with eye MD for eye pressure.    Past Medical History:  Diagnosis Date  . Diabetes ([place])    type 2  . GERD (gastroesophageal reflux disease)    rarely occurs- no meds  . Hypertension   . Hypothyroidism   . Shortness of breath    pt states sob on exertion because she is "out of shape"     Family History  Problem Relation Age of Onset  . Hypertension Mother   . Heart disease Mother   . Kidney failure Mother   . Heart Problems Father   . Heart Problems Sister   . Cardiomyopathy Sister   . Ovarian cancer Sister      Current Outpatient Medications:  .  acetaminophen (TYLENOL) 500 MG tablet, Take 1 tablet (500 mg total) by mouth every 8 (eight) hours as needed., Disp: 30 tablet, Rfl: 1 .  Biotin 5000 MCG CAPS, Take 1 capsule by mouth daily. , Disp: , Rfl:  .  cetirizine (ZYRTEC) 10 MG tablet, Take 1 tablet (10 mg total) by mouth daily. (Patient taking differently: Take 10 mg by mouth daily as needed for allergies. ), Disp: 30 tablet, Rfl: 3 .  diclofenac (VOLTAREN) 75 MG EC tablet, Take 1 tablet (75 mg total) by mouth 2 (two) times daily. (Patient not taking: Reported on 05/10/2018), Disp: 30 tablet, Rfl: 0 .  DULoxetine (CYMBALTA) 60 MG capsule, TAKE 1 CAPSULE BY MOUTH EVERY DAY, Disp: 90 capsule, Rfl: 1 .  gabapentin (NEURONTIN) 300 MG capsule, Take 300 mg by mouth 3 (three) times daily., Disp: , Rfl:  .  ibuprofen (ADVIL,MOTRIN) 400 MG  tablet, Take by mouth., Disp: , Rfl:  .  KOMBIGLYZE XR 5-500 MG TB24, TAKE 1 TABLET BY MOUTH EVERY DAY IN THE EVENING WITH DINNER, Disp: 90 tablet, Rfl: 1 .  levothyroxine (SYNTHROID, LEVOTHROID) 75 MCG tablet, Take 75 mcg by mouth daily.  , Disp: , Rfl:  .  losartan-hydrochlorothiazide (HYZAAR) 50-12.5 MG per tablet, Take 1 tablet by mouth daily.  , Disp: , Rfl:  .  Multiple Vitamin (MULTIVITAMIN WITH MINERALS) TABS tablet, Take 1 tablet by mouth daily., Disp: , Rfl:  .  NONFORMULARY OR COMPOUNDED ITEM, Compounded cream for right knee as needed, Disp: , Rfl:  .  oxyCODONE-acetaminophen (PERCOCET/ROXICET) 5-325 MG tablet, Take 1-2 tablets by mouth every 4 (four) hours as needed for severe pain. (Patient not taking: Reported on 03/27/2018), Disp: 30 tablet, Rfl: 0 .  pravastatin (PRAVACHOL) 40 MG tablet, Take 40 mg by mouth daily., Disp: , Rfl:    Allergies  Allergen Reactions  . Cyclobenzaprine Nausea Only     Review of Systems  Musculoskeletal: Positive for arthralgias and joint swelling.       Gets R posterior thigh cramps is she twists the wrong way.   Neurological: Positive for numbness.  Tingling on L fare and L orbit, 1/2 her nose and mouth   The rest is of ROS negative  Today's Vitals   05/10/18 1052  BP: 138/80  Pulse: (!) 103  Temp: 97.7 F (36.5 C)  TempSrc: Oral  Weight: 178 lb (80.7 kg)  Height: 5\' 4"  (1.626 m)   Body mass index is 30.55 kg/m.   Objective:  Physical Exam  BP 138/80 (BP Location: Left Arm, Patient Position: Sitting, Cuff Size: Normal)   Pulse (!) 103   Temp 97.7 F (36.5 C) (Oral)   Ht 5\' 4"  (1.626 m)   Wt 178 lb (80.7 kg)   BMI 30.55 kg/m   General Appearance:    Alert, cooperative, no distress, appears stated age  Head:    Normocephalic, without obvious abnormality, atraumatic. L face does not seem to move as much as the R when she talks, but there is no drooping  Eyes:    PERRL, conjunctiva/corneas clear, EOM's intact.  Ears:     Normal TM's and external ear canals, both ears  Nose:   Nares normal, septum midline, mucosa normal, no drainage    or sinus tenderness  Throat:   Lips, mucosa, and tongue normal; teeth and gums normal  Neck:   Supple, symmetrical, trachea midline, no adenopathy;    thyroid:  no enlargement/tenderness/nodules; no carotid   bruit or JVD  Back:     Symmetric, no curvature, ROM normal, no CVA tenderness  Lungs:     Clear to auscultation bilaterally, respirations unlabored  Chest Wall:    No tenderness or deformity   Heart:    Regular rate and rhythm, S1 and S2 normal, no murmur, rub   or gallop  Breast Exam:    No tenderness, masses, or nipple abnormality  Abdomen:     Soft, non-tender, bowel sounds active all four quadrants,    no masses, no organomegaly. No inguinal nodes palpated bilaterally.            Extremities:   Extremities normal, atraumatic, no cyanosis or edema  Pulses:   2+ and symmetric all extremities  Skin:   Skin color, texture, turgor normal, no rashes or lesions  Lymph nodes:   Cervical, supraclavicular, and axillary nodes normal  Neurologic:   CNII-XII intact, normal strength, sensation and reflexes    Throughout. [place], tandem gait, heel and tip toe gait normal.   EKG- normal sinus rhythm Assessment And Plan:  1. Bilateral primary osteoarthritis of knee- acute - Ambulatory referral to Orthopedic Surgery  2. Trigeminal neuralgia of left side of face- chronic - Ambulatory referral to Neurology  3. Cortical age-related cataract of both eyes-  - Ambulatory referral to Ophthalmology  4. Screening for breast cancer- screen mammogram ordered  5. Encounter for general adult medical examination with abnormal findings. Routine- FU 1 y  6. Essential hypertension- stable. May continue same meds - Magnesium - CMP14 + Anion Gap - CBC no Diff - EKG 12-Lead - POCT Urinalysis Dipstick (81002) - POCT UA - Microalbumin  7. Hypothyroidism, unspecified type- chronic. May  continue same meds for now - TSH - T4, Free - T3, free  8. Uncontrolled type 2 diabetes mellitus with hyperglycemia ([place])- chronic - Hemoglobin A1c May continue same meds until we receive her results.  9. Cramps, extremity- acute. I massaged DeepBlue cream on her R posterior thigh while having the cramps and helped her. She decided to purchase this from the [facility].  - Magnesium  10. Pure  hypercholesterolemia- chronic May continue same med.  - Lipid Profile 11- Obesity BMI of 30.55- Lipo B shot given and she will continue this weekly.   FU with PCP in  3 months for HTN and DM   Hakeen Shipes RODRIGUEZ-SOUTHWORTH, PA-C

## 2018-05-11 ENCOUNTER — Other Ambulatory Visit: Payer: Self-pay | Admitting: Internal Medicine

## 2018-05-11 DIAGNOSIS — R748 Abnormal levels of other serum enzymes: Secondary | ICD-10-CM

## 2018-05-11 LAB — CBC
Hematocrit: 41.4 % (ref 34.0–46.6)
Hemoglobin: 13.6 g/dL (ref 11.1–15.9)
MCH: 27.3 pg (ref 26.6–33.0)
MCHC: 32.9 g/dL (ref 31.5–35.7)
MCV: 83 fL (ref 79–97)
Platelets: 283 10*3/uL (ref 150–450)
RBC: 4.98 x10E6/uL (ref 3.77–5.28)
RDW: 13.7 % (ref 12.3–15.4)
WBC: 6.2 10*3/uL (ref 3.4–10.8)

## 2018-05-11 LAB — CMP14 + ANION GAP
ALT: 18 IU/L (ref 0–32)
AST: 25 IU/L (ref 0–40)
Albumin/Globulin Ratio: 1.4 (ref 1.2–2.2)
Albumin: 4.4 g/dL (ref 3.6–4.8)
Alkaline Phosphatase: 121 IU/L — ABNORMAL HIGH (ref 39–117)
Anion Gap: 23 mmol/L — ABNORMAL HIGH (ref 10.0–18.0)
BUN/Creatinine Ratio: 20 (ref 12–28)
BUN: 15 mg/dL (ref 8–27)
Bilirubin Total: 0.5 mg/dL (ref 0.0–1.2)
CO2: 18 mmol/L — ABNORMAL LOW (ref 20–29)
Calcium: 9.9 mg/dL (ref 8.7–10.3)
Chloride: 102 mmol/L (ref 96–106)
Creatinine, Ser: 0.75 mg/dL (ref 0.57–1.00)
GFR calc Af Amer: 95 mL/min/{1.73_m2} (ref >59)
GFR calc non Af Amer: 82 mL/min/{1.73_m2} (ref >59)
Globulin, Total: 3.1 g/dL (ref 1.5–4.5)
Glucose: 91 mg/dL (ref 65–99)
Potassium: 4.3 mmol/L (ref 3.5–5.2)
Sodium: 143 mmol/L (ref 134–144)
Total Protein: 7.5 g/dL (ref 6.0–8.5)

## 2018-05-11 LAB — LIPID PANEL
Chol/HDL Ratio: 3.5 ratio (ref 0.0–4.4)
Cholesterol, Total: 183 mg/dL (ref 100–199)
HDL: 53 mg/dL (ref >39)
LDL Calculated: 103 mg/dL — ABNORMAL HIGH (ref 0–99)
Triglycerides: 134 mg/dL (ref 0–149)
VLDL Cholesterol Cal: 27 mg/dL (ref 5–40)

## 2018-05-11 LAB — HEMOGLOBIN A1C
Est. average glucose Bld gHb Est-mCnc: 128 mg/dL
Hgb A1c MFr Bld: 6.1 % — ABNORMAL HIGH (ref 4.8–5.6)

## 2018-05-11 LAB — T4, FREE: Free T4: 0.95 ng/dL (ref 0.82–1.77)

## 2018-05-11 LAB — MAGNESIUM: Magnesium: 2.1 mg/dL (ref 1.6–2.3)

## 2018-05-11 LAB — T3, FREE: T3, Free: 2.3 pg/mL (ref 2.0–4.4)

## 2018-05-11 LAB — TSH: TSH: 1.71 u[IU]/mL (ref 0.450–4.500)

## 2018-05-18 ENCOUNTER — Other Ambulatory Visit: Payer: Self-pay | Admitting: Nurse Practitioner

## 2018-05-18 DIAGNOSIS — E1165 Type 2 diabetes mellitus with hyperglycemia: Secondary | ICD-10-CM

## 2018-05-18 MED ORDER — SAXAGLIPTIN-METFORMIN ER 5-500 MG PO TB24: 1.0000 | ORAL_TABLET | Freq: Every day | ORAL | 1 refills | Status: DC

## 2018-05-19 LAB — ALKALINE PHOSPHATASE, ISOENZYMES

## 2018-05-19 LAB — SPECIMEN STATUS REPORT

## 2018-05-22 ENCOUNTER — Other Ambulatory Visit: Payer: Self-pay | Admitting: Internal Medicine

## 2018-05-30 ENCOUNTER — Telehealth: Payer: Self-pay | Admitting: *Deleted

## 2018-05-30 NOTE — Telephone Encounter (Signed)
Called patient and advised her that her consult appointment in Jan needs to moved to later the same day. She agreed; this RN moved her appointment from 1:00 pm to 3:30 pm and advised her she will get a reminder call before the appointment day. She verbalized understanding, appreciation.

## 2018-06-09 ENCOUNTER — Other Ambulatory Visit: Payer: Self-pay | Admitting: Internal Medicine

## 2018-06-09 DIAGNOSIS — Z1239 Encounter for other screening for malignant neoplasm of breast: Secondary | ICD-10-CM

## 2018-07-04 ENCOUNTER — Encounter: Payer: Self-pay | Admitting: Diagnostic Neuroimaging

## 2018-07-04 ENCOUNTER — Institutional Professional Consult (permissible substitution): Payer: Medicare Other | Admitting: Diagnostic Neuroimaging

## 2018-07-04 ENCOUNTER — Ambulatory Visit: Payer: Medicare Other | Admitting: Diagnostic Neuroimaging

## 2018-07-04 VITALS — BP 130/82 | HR 109 | Ht 64.0 in | Wt 181.8 lb

## 2018-07-04 DIAGNOSIS — G501 Atypical facial pain: Secondary | ICD-10-CM | POA: Diagnosis not present

## 2018-07-04 MED ORDER — DULOXETINE HCL 60 MG PO CPEP: 60.0000 mg | ORAL_CAPSULE | Freq: Every day | ORAL | 4 refills | Status: DC

## 2018-07-04 MED ORDER — GABAPENTIN 300 MG PO CAPS: 900.0000 mg | ORAL_CAPSULE | Freq: Three times a day (TID) | ORAL | 4 refills | Status: DC

## 2018-07-04 NOTE — Progress Notes (Signed)
[facility]  PATIENT: Christina Hernandez DOB: August 26, 1950  REFERRING CLINICIAN:  HISTORY FROM: patient  REASON FOR VISIT: follow up   HISTORICAL  CHIEF COMPLAINT:  Chief Complaint  Patient presents with  . New Patient (Initial Visit)    Rm 7, Internal referral Dr. Glendale Chard.    . Trigeminal Neuralgia    L face.  Gamma Knife (Dr Salomon Fick 4.2019), much worse.  On gabapentin 300 po tid and cymbalta 60mg  daily    HISTORY OF PRESENT ILLNESS:   UPDATE (07/04/18, VRP): Since last visit, doing poorly. Had gamma knife therapy in Oct 2017, and sxs worsened. Has seen [facility] and [facility] and pain clinics with no relief. Symptoms are worsening. Severity is moderate. No alleviating or aggravating factors. Tolerating gabapentin 900mg  TID. Ran out of cymbalta.     UPDATE 12/17/15: Since last visit, symptoms are stable. Pain in left face ranging 5/10-8/10. Tolerating meds. On gabapentin 900mg  TID + duloxetine 30mg  daily. Depression stable (mild).   UPDATE 06/18/15: Since last visit, tried lyrica --> caused side effect of sleepiness. Now on gabapentin 900mg  TID. More depression as a result of left face pain (6/10 severity).   UPDATE 11/26/14: Since last visit, sxs have continued. Tried carbamazepine x 2 months without relief. Now off CBZ. Still on gabapentin. More depression and fatigue. Pain is 7/10 in severity.   UPDATE 07/30/14: Still with left lower lip numbness and burning pain. On gabapentin 600-900mg  TID, with only mild benefit.   UPDATE 04/26/14: Since last visit, stable. Still with left chin numbness and left jaw pain. On gabapentin 600mg  TID without significant relief. No new symptoms. Has appt with endocrine re: thyroid inflammation/enlargement. Also need parathyroid eval according to dentist, based on calcium levels and bone appearance.   PRIOR HPI (03/13/14): 68 year old right-handed female here for evaluation of left trigeminal neuralgia. January 2014 patient had second  and third molar impaction, treated with extraction. Patient did well with the procedure. In May 2015 she developed "heavy feeling" in her left jaw. In 12/31/2013 patient had severe pain in her left lower jaw. She numbness in her left lower teeth, gums, Chin, jaw. Patient was noted to have left submandibular swelling, treated with antibiotics. CT scan showed florid cemento-osseous dysplasia, and formal bone biopsy showed vital bone and no osteomyelitis. Patient was presumed to have left trigeminal neuralgia, mandibular branch, and started on gabapentin 300 mg daily. Since that time swelling in the left abdomen the region has reduced. She still has pain in the left jaw. She has significant numbness, tingling, very sensation in her left chin and left lower lip region.    REVIEW OF SYSTEMS: Full 14 system review of systems performed and negative except: numbness snoring insomnia fatigue aching muscles.    ALLERGIES: Allergies  Allergen Reactions  . Cyclobenzaprine Nausea Only    HOME MEDICATIONS: Outpatient Medications Prior to Visit  Medication Sig Dispense Refill  . acetaminophen (TYLENOL) 500 MG tablet Take 1 tablet (500 mg total) by mouth every 8 (eight) hours as needed. 30 tablet 1  . Biotin 5000 MCG CAPS Take 1 capsule by mouth daily.     . cetirizine (ZYRTEC) 10 MG tablet Take 1 tablet (10 mg total) by mouth daily. (Patient taking differently: Take 10 mg by mouth daily as needed for allergies. ) 30 tablet 3  . diclofenac (VOLTAREN) 75 MG EC tablet Take 1 tablet (75 mg total) by mouth 2 (two) times daily. 30 tablet 0  . DULoxetine (CYMBALTA) 60 MG  capsule TAKE 1 CAPSULE BY MOUTH EVERY DAY 90 capsule 1  . gabapentin (NEURONTIN) 300 MG capsule Take 300 mg by mouth 3 (three) times daily.    Marland Kitchen ibuprofen (ADVIL,MOTRIN) 400 MG tablet Take by mouth.    . losartan-hydrochlorothiazide (HYZAAR) 50-12.5 MG per tablet Take 1 tablet by mouth daily.      . Multiple Vitamin (MULTIVITAMIN WITH MINERALS)  TABS tablet Take 1 tablet by mouth daily.    . NONFORMULARY OR COMPOUNDED ITEM Compounded cream for right knee as needed    . pravastatin (PRAVACHOL) 40 MG tablet Take 40 mg by mouth daily.    . Saxagliptin-Metformin (KOMBIGLYZE XR) 5-500 MG TB24 Take 1 tablet by mouth daily before supper. 90 tablet 1  . SYNTHROID 75 MCG tablet TAKE 1 TABLET DAILY MON-SAT, HOLD ON SUNDAY. 26 tablet 4  . oxyCODONE-acetaminophen (PERCOCET/ROXICET) 5-325 MG tablet Take 1-2 tablets by mouth every 4 (four) hours as needed for severe pain. (Patient not taking: Reported on 03/27/2018) 30 tablet 0   No facility-administered medications prior to visit.     PAST MEDICAL HISTORY: Past Medical History:  Diagnosis Date  . Diabetes ([place])    type 2  . GERD (gastroesophageal reflux disease)    rarely occurs- no meds  . Hypertension   . Hypothyroidism   . Shortness of breath    pt states sob on exertion because she is "out of shape"    PAST SURGICAL HISTORY: Past Surgical History:  Procedure Laterality Date  . COLONOSCOPY    . cyber knife Left 04/08/2016  . HYSTEROSCOPY W/D&C  05/24/2011   Procedure: DILATATION AND CURETTAGE (D&C) /HYSTEROSCOPY;  Surgeon: [name], MD;  Location: [facility];  Service: Gynecology;  Laterality: N/A;  . KNEE SURGERY Right 06/10/15   torn ligament  . THYROIDECTOMY N/A 09/08/2016   Procedure: TOTAL THYROIDECTOMY;  Surgeon: [name], MD;  Location: [facility];  Service: ENT;  Laterality: N/A;  . TUBAL LIGATION    . uterine polyp      FAMILY HISTORY: Family History  Problem Relation Age of Onset  . Hypertension Mother   . Heart disease Mother   . Kidney failure Mother   . Heart Problems Father   . Heart Problems Sister   . Cardiomyopathy Sister   . Ovarian cancer Sister     SOCIAL HISTORY:  Social History   Socioeconomic History  . Marital status: Married    Spouse name: [name]  . Number of children: 4  . Years of education: BS  . Highest education level: Not on file    Occupational History  . Occupation: Retired    Fish farm manager: [employer]  Social Needs  . Financial resource strain: Not hard at all  . Food insecurity:    Worry: Never true    Inability: Never true  . Transportation needs:    Medical: No    Non-medical: No  Tobacco Use  . Smoking status: Never Smoker  . Smokeless tobacco: Never Used  Substance and Sexual Activity  . Alcohol use: No  . Drug use: No  . Sexual activity: Not Currently  Lifestyle  . Physical activity:    Days per week: 0 days    Minutes per session: 0 min  . Stress: Only a little  Relationships  . Social connections:    Talks on phone: Not on file    Gets together: Not on file    Attends religious service: Not on file    Active member of club or organization:  Not on file    Attends meetings of clubs or organizations: Not on file    Relationship status: Not on file  . Intimate partner violence:    Fear of current or ex partner: No    Emotionally abused: No    Physically abused: No    Forced sexual activity: No  Other Topics Concern  . Not on file  Social History Narrative   Patient lives at home with family.   Caffeine Use: occasionally     PHYSICAL EXAM  Vitals:   07/04/18 1532  BP: 130/82  Pulse: (!) 109  Weight: 181 lb 12.8 oz (82.5 kg)  Height: 5\' 4"  (1.626 m)    Not recorded      Body mass index is 31.21 kg/m.  GENERAL EXAM: Patient is in no distress; well developed, nourished and groomed; neck is supple  CARDIOVASCULAR: Regular rate and rhythm, no murmurs, no carotid bruits  NEUROLOGIC: MENTAL STATUS: awake, alert, language fluent, comprehension intact, naming intact, fund of knowledge appropriate CRANIAL NERVE: pupils equal and reactive to light, visual fields full to confrontation, extraocular muscles intact, no nystagmus, SENSATION NORMAL TO LIGHT TOUCH, FACIAL STRENGTH SYMM, hearing intact, palate elevates symmetrically, uvula midline, shoulder shrug symmetric, tongue midline. MOTOR:  normal bulk and tone, full strength in the BUE, BLE SENSORY: normal and symmetric to light touch, pinprick COORDINATION: finger-nose-finger, fine finger movements normal REFLEXES: deep tendon reflexes TRACE and symmetric GAIT/STATION: narrow based gait    DIAGNOSTIC DATA (LABS, IMAGING, TESTING) - I reviewed patient records, labs, notes, testing and imaging myself where available.  Lab Results  Component Value Date   WBC 6.2 05/10/2018   HGB 13.6 05/10/2018   HCT 41.4 05/10/2018   MCV 83 05/10/2018   PLT 283 05/10/2018      Component Value Date/Time   NA 143 05/10/2018 1156   K 4.3 05/10/2018 1156   CL 102 05/10/2018 1156   CO2 18 (L) 05/10/2018 1156   GLUCOSE 91 05/10/2018 1156   GLUCOSE 112 (H) 09/01/2016 1029   BUN 15 05/10/2018 1156   CREATININE 0.75 05/10/2018 1156   CALCIUM 9.9 05/10/2018 1156   PROT 7.5 05/10/2018 1156   ALBUMIN 4.4 05/10/2018 1156   AST 25 05/10/2018 1156   ALT 18 05/10/2018 1156   ALKPHOS 121 (H) 05/10/2018 1156   ALKPHOS CANCELED 05/10/2018 1156   BILITOT 0.5 05/10/2018 1156   GFRNONAA 82 05/10/2018 1156   GFRAA 95 05/10/2018 1156   Lab Results  Component Value Date   CHOL 183 05/10/2018   Lab Results  Component Value Date   HGBA1C 6.1 (H) 05/10/2018   No results found for: [id number] Lab Results  Component Value Date   TSH 1.710 05/10/2018     02/2314 MRI brain 1. Few punctate periventricular and subcortical foci of non-specific gliosis. These findings are non-specific and considerations include autoimmune, inflammatory, post-infectious, microvascular ischemic or migraine associated etiologies.  2. No acute findings.  3. Of note, this study was performed without IV contrast, as IV access could not be obtained.    ASSESSMENT AND PLAN  68 y.o. year old female here with left trigeminal nerve pain, in setting of multiple dental procedures in the left mandibular region and presumed infx/abscess treated with antibiotics. S/p gamma  knife therapy in Oct 2017.  Also with mild situational depression.  Dx:  Atypical facial pain   PLAN:  FACIAL PAIN - continue gabapentin 900mg  three times a day  - continue duloxetine 60mg  daily - refer  to pain mgmt clinic (for nerve block and medication mgmt)  Meds ordered this encounter  Medications  . gabapentin (NEURONTIN) 300 MG capsule    Sig: Take 3 capsules (900 mg total) by mouth 3 (three) times daily.    Dispense:  810 capsule    Refill:  4  . DULoxetine (CYMBALTA) 60 MG capsule    Sig: Take 1 capsule (60 mg total) by mouth daily.    Dispense:  90 capsule    Refill:  4   Orders Placed This Encounter  Procedures  . Ambulatory referral to Pain Clinic   Return in about 1 year (around 07/05/2019).    Penni Bombard, MD 03/01/4460, 9:01 PM Certified in Neurology, Neurophysiology and Neuroimaging  [facility] [address] [place], [place] [address] [phone]

## 2018-07-07 ENCOUNTER — Ambulatory Visit: Payer: Medicare Other | Admitting: Family Medicine

## 2018-07-10 ENCOUNTER — Ambulatory Visit
Admission: RE | Admit: 2018-07-10 | Discharge: 2018-07-10 | Disposition: A | Discharge status: Home / Self Care | Payer: Medicare Other | Source: Ambulatory Visit | Attending: Internal Medicine | Admitting: Internal Medicine

## 2018-07-10 ENCOUNTER — Ambulatory Visit: Payer: Medicare Other | Admitting: Emergency Medicine

## 2018-07-10 ENCOUNTER — Other Ambulatory Visit: Payer: Self-pay

## 2018-07-10 ENCOUNTER — Encounter: Payer: Self-pay | Admitting: Emergency Medicine

## 2018-07-10 VITALS — BP 147/79 | HR 86 | Temp 98.5°F | Resp 16 | Ht 64.5 in | Wt 179.8 lb

## 2018-07-10 DIAGNOSIS — M79604 Pain in right leg: Secondary | ICD-10-CM | POA: Diagnosis not present

## 2018-07-10 DIAGNOSIS — Z1239 Encounter for other screening for malignant neoplasm of breast: Secondary | ICD-10-CM

## 2018-07-10 DIAGNOSIS — M170 Bilateral primary osteoarthritis of knee: Secondary | ICD-10-CM

## 2018-07-10 DIAGNOSIS — M79605 Pain in left leg: Secondary | ICD-10-CM

## 2018-07-10 MED ORDER — KETOROLAC TROMETHAMINE 60 MG/2ML IM SOLN
60.0000 mg | Freq: Once | INTRAMUSCULAR | Status: AC
  Administered 2018-07-10: 60 mg via INTRAMUSCULAR

## 2018-07-10 MED ORDER — TRAMADOL HCL 50 MG PO TABS: 50.0000 mg | ORAL_TABLET | Freq: Three times a day (TID) | ORAL | 0 refills | Status: DC | PRN

## 2018-07-10 NOTE — Patient Instructions (Addendum)
If you have lab work done today you will be contacted with your lab results within the next 2 weeks.  If you have not heard from [place] then please contact us. The fastest way to get your results is to register for My Chart.   IF you received an x-ray today, you will receive an invoice from [facility]. Please contact [facility] at [phone] with questions or concerns regarding your invoice.   IF you received labwork today, you will receive an invoice from [place]. Please contact [facility] at [phone] with questions or concerns regarding your invoice.   Our billing staff will not be able to assist you with questions regarding bills from these companies.  You will be contacted with the lab results as soon as they are available. The fastest way to get your results is to activate your My Chart account. Instructions are located on the last page of this paperwork. If you have not heard from [place] regarding the results in 2 weeks, please contact this office.     Osteoarthritis  Osteoarthritis is a type of arthritis that affects tissue that covers the ends of bones in joints (cartilage). Cartilage acts as a cushion between the bones and helps them move smoothly. Osteoarthritis results when cartilage in the joints gets worn down. Osteoarthritis is sometimes called "wear and tear" arthritis. Osteoarthritis is the most common form of arthritis. It often occurs in older people. It is a condition that gets worse over time (a progressive condition). Joints that are most often affected by this condition are in:  Fingers.  Toes.  Hips.  Knees.  Spine, including neck and lower back. What are the causes? This condition is caused by age-related wearing down of cartilage that covers the ends of bones. What increases the risk? The following factors may make you more likely to develop this condition:  Older age.  Being overweight or obese.  Overuse of joints, such as in  athletes.  Past injury of a joint.  Past surgery on a joint.  Family history of osteoarthritis. What are the signs or symptoms? The main symptoms of this condition are pain, swelling, and stiffness in the joint. The joint may lose its shape over time. Small pieces of bone or cartilage may break off and float inside of the joint, which may cause more pain and damage to the joint. Small deposits of bone (osteophytes) may grow on the edges of the joint. Other symptoms may include:  A grating or scraping feeling inside the joint when you move it.  Popping or creaking sounds when you move. Symptoms may affect one or more joints. Osteoarthritis in a major joint, such as your knee or hip, can make it painful to walk or exercise. If you have osteoarthritis in your hands, you might not be able to grip items, twist your hand, or control small movements of your hands and fingers (fine motor skills). How is this diagnosed? This condition may be diagnosed based on:  Your medical history.  A physical exam.  Your symptoms.  X-rays of the affected joint(s).  Blood tests to rule out other types of arthritis. How is this treated? There is no cure for this condition, but treatment can help to control pain and improve joint function. Treatment plans may include:  A prescribed exercise program that allows for rest and joint relief. You may work with a physical therapist.  A weight control plan.  Pain relief techniques, such as: ? Applying heat and  cold to the joint. ? Electric pulses delivered to nerve endings under the skin (transcutaneous electrical nerve stimulation, or TENS). ? Massage. ? Certain nutritional supplements.  NSAIDs or prescription medicines to help relieve pain.  Medicine to help relieve pain and inflammation (corticosteroids). This can be given by mouth (orally) or as an injection.  Assistive devices, such as a brace, wrap, splint, specialized glove, or cane.  Surgery, such  as: ? An osteotomy. This is done to reposition the bones and relieve pain or to remove loose pieces of bone and cartilage. ? Joint replacement surgery. You may need this surgery if you have very bad (advanced) osteoarthritis. Follow these instructions at home: Activity  Rest your affected joints as directed by your health care provider.  Do not drive or use heavy machinery while taking prescription pain medicine.  Exercise as directed. Your health care provider or physical therapist may recommend specific types of exercise, such as: ? Strengthening exercises. These are done to strengthen the muscles that support joints that are affected by arthritis. They can be performed with weights or with exercise bands to add resistance. ? Aerobic activities. These are exercises, such as brisk walking or water aerobics, that get your heart pumping. ? Range-of-motion activities. These keep your joints easy to move. ? Balance and agility exercises. Managing pain, stiffness, and swelling      If directed, apply heat to the affected area as often as told by your health care provider. Use the heat source that your health care provider recommends, such as a moist heat pack or a heating pad. ? If you have a removable assistive device, remove it as told by your health care provider. ? Place a towel between your skin and the heat source. If your health care provider tells you to keep the assistive device on while you apply heat, place a towel between the assistive device and the heat source. ? Leave the heat on for 20-30 minutes. ? Remove the heat if your skin turns bright red. This is especially important if you are unable to feel pain, heat, or cold. You may have a greater risk of getting burned.  If directed, put ice on the affected joint: ? If you have a removable assistive device, remove it as told by your health care provider. ? Put ice in a plastic bag. ? Place a towel between your skin and the bag.  If your health care provider tells you to keep the assistive device on during icing, place a towel between the assistive device and the bag. ? Leave the ice on for 20 minutes, 2-3 times a day. General instructions  Take over-the-counter and prescription medicines only as told by your health care provider.  Maintain a healthy weight. Follow instructions from your health care provider for weight control. These may include dietary restrictions.  Do not use any products that contain nicotine or tobacco, such as cigarettes and e-cigarettes. These can delay bone healing. If you need help quitting, ask your health care provider.  Use assistive devices as directed by your health care provider.  Keep all follow-up visits as told by your health care provider. This is important. Where to find more information  [employer] of Arthritis and Musculoskeletal and Skin Diseases: www.niams.SouthExposed.es  [employer] on Aging: http://kim-miller.com/  American College of Rheumatology: www.rheumatology.org Contact a health care provider if:  Your skin turns red.  You develop a rash.  You have pain that gets worse.  You have a fever along  with joint or muscle aches. Get help right away if:  You lose a lot of weight.  You suddenly lose your appetite.  You have night sweats. Summary  Osteoarthritis is a type of arthritis that affects tissue covering the ends of bones in joints (cartilage).  This condition is caused by age-related wearing down of cartilage that covers the ends of bones.  The main symptom of this condition is pain, swelling, and stiffness in the joint.  There is no cure for this condition, but treatment can help to control pain and improve joint function. This information is not intended to replace advice given to you by your health care provider. Make sure you discuss any questions you have with your health care provider. Document Released: 06/14/2005 Document Revised:  03/21/2017 Document Reviewed: 02/16/2016 Elsevier Interactive Patient Education  2019 [employer].

## 2018-07-10 NOTE — Progress Notes (Signed)
Christina Hernandez 68 y.o.   Chief Complaint  Patient presents with  . Legs    hurt down from groin area to feet x 2-3 months, gotten worse, "can't sleep at night, particular terrible."    HISTORY OF PRESENT ILLNESS: This is a 68 y.o. female complaining of the pain to both legs on and off for the past 2 to 3 months.  Patient has a history of osteoarthritis.  Pain is worse at nighttime.  Denies recent injuries.  Denies associated symptoms.  HPI   Prior to Admission medications   Medication Sig Start Date End Date Taking? Authorizing Provider  acetaminophen (TYLENOL) 500 MG tablet Take 1 tablet (500 mg total) by mouth every 8 (eight) hours as needed. 03/23/16  Yes Chitanand, Alicia B, DO  Biotin 4270 MCG CAPS Take 1 capsule by mouth daily.    Yes [provider]  cetirizine (ZYRTEC) 10 MG tablet Take 1 tablet (10 mg total) by mouth daily. Patient taking differently: Take 10 mg by mouth daily as needed for allergies.  10/29/14  Yes Tereasa Coop, PA-C  DULoxetine (CYMBALTA) 60 MG capsule Take 1 capsule (60 mg total) by mouth daily. 07/04/18  Yes Penumalli, Earlean Polka, MD  gabapentin (NEURONTIN) 300 MG capsule Take 3 capsules (900 mg total) by mouth 3 (three) times daily. 07/04/18  Yes Penumalli, Earlean Polka, MD  ibuprofen (ADVIL,MOTRIN) 400 MG tablet Take by mouth. 06/15/17  Yes [provider]  losartan-hydrochlorothiazide (HYZAAR) 50-12.5 MG per tablet Take 1 tablet by mouth daily.     Yes [provider]  Multiple Vitamin (MULTIVITAMIN WITH MINERALS) TABS tablet Take 1 tablet by mouth daily.   Yes [provider]  pravastatin (PRAVACHOL) 40 MG tablet Take 40 mg by mouth daily.   Yes [provider]  Saxagliptin-Metformin (KOMBIGLYZE XR) 5-500 MG TB24 Take 1 tablet by mouth daily before supper. 05/18/18  Yes [name], FNP  SYNTHROID 75 MCG tablet TAKE 1 TABLET DAILY MON-SAT, HOLD ON SUNDAY. 05/22/18  Yes Glendale Chard, MD  diclofenac (VOLTAREN) 75  MG EC tablet Take 1 tablet (75 mg total) by mouth 2 (two) times daily. Patient not taking: Reported on 07/10/2018 03/27/18   Posey Boyer, MD  NONFORMULARY OR COMPOUNDED ITEM Compounded cream for right knee as needed    [provider]    Allergies  Allergen Reactions  . Cyclobenzaprine Nausea Only    Patient Active Problem List   Diagnosis Date Noted  . Pure hypercholesterolemia 05/10/2018  . Uncontrolled type 2 diabetes mellitus with hyperglycemia ([place]) 05/10/2018  . Essential hypertension 05/10/2018  . Bilateral primary osteoarthritis of knee 05/10/2018  . Hypothyroidism 05/10/2018  . Elevated blood pressure reading 11/24/2016  . Acute non-recurrent maxillary sinusitis 11/24/2016  . H/O total thyroidectomy 09/08/2016  . Acute upper respiratory infection 03/23/2016  . Trigeminal neuralgia of left side of face 04/26/2014  . Left jaw numbness 04/26/2014    Past Medical History:  Diagnosis Date  . Diabetes ([place])    type 2  . GERD (gastroesophageal reflux disease)    rarely occurs- no meds  . Hypertension   . Hypothyroidism   . Shortness of breath    pt states sob on exertion because she is "out of shape"    Past Surgical History:  Procedure Laterality Date  . COLONOSCOPY    . cyber knife Left 04/08/2016  . HYSTEROSCOPY W/D&C  05/24/2011   Procedure: DILATATION AND CURETTAGE (D&C) /HYSTEROSCOPY;  Surgeon: [name], MD;  Location:  [facility];  Service: Gynecology;  Laterality: N/A;  . KNEE SURGERY Right 06/10/15   torn ligament  . THYROIDECTOMY N/A 09/08/2016   Procedure: TOTAL THYROIDECTOMY;  Surgeon: [name], MD;  Location: [facility];  Service: ENT;  Laterality: N/A;  . TUBAL LIGATION    . uterine polyp      Social History   Socioeconomic History  . Marital status: Married    Spouse name: [name]  . Number of children: 4  . Years of education: BS  . Highest education level: Not on file  Occupational History  . Occupation: Retired    Fish farm manager: OTHER    Social Needs  . Financial resource strain: Not hard at all  . Food insecurity:    Worry: Never true    Inability: Never true  . Transportation needs:    Medical: No    Non-medical: No  Tobacco Use  . Smoking status: Never Smoker  . Smokeless tobacco: Never Used  Substance and Sexual Activity  . Alcohol use: No  . Drug use: No  . Sexual activity: Not Currently  Lifestyle  . Physical activity:    Days per week: 0 days    Minutes per session: 0 min  . Stress: Only a little  Relationships  . Social connections:    Talks on phone: Not on file    Gets together: Not on file    Attends religious service: Not on file    Active member of club or organization: Not on file    Attends meetings of clubs or organizations: Not on file    Relationship status: Not on file  . Intimate partner violence:    Fear of current or ex partner: No    Emotionally abused: No    Physically abused: No    Forced sexual activity: No  Other Topics Concern  . Not on file  Social History Narrative   Patient lives at home with family.   Caffeine Use: occasionally    Family History  Problem Relation Age of Onset  . Hypertension Mother   . Heart disease Mother   . Kidney failure Mother   . Heart Problems Father   . Heart Problems Sister   . Cardiomyopathy Sister   . Ovarian cancer Sister   . Breast cancer Neg Hx      Review of Systems  Constitutional: Negative.  Negative for chills and fever.  HENT: Negative.   Eyes: Negative.   Respiratory: Negative.  Negative for cough, hemoptysis and shortness of breath.   Cardiovascular: Negative.  Negative for chest pain and palpitations.  Gastrointestinal: Negative.  Negative for abdominal pain, diarrhea, nausea and vomiting.  Genitourinary: Negative.   Musculoskeletal: Positive for joint pain.  Skin: Negative.   Neurological: Negative.  Negative for dizziness and headaches.  Endo/Heme/Allergies: Negative.     Vitals:   07/10/18 1714  BP: (!)  147/79  Pulse: 86  Resp: 16  Temp: 98.5 F (36.9 C)  SpO2: 98%    Physical Exam Vitals signs reviewed.  Constitutional:      Appearance: Normal appearance.  HENT:     Head: Normocephalic.     Nose: Nose normal.     Mouth/Throat:     Mouth: Mucous membranes are moist.     Pharynx: Oropharynx is clear.  Eyes:     Extraocular Movements: Extraocular movements intact.     Conjunctiva/sclera: Conjunctivae normal.     Pupils: Pupils are equal, round, and reactive to light.  Neck:  Musculoskeletal: Normal range of motion and neck supple.  Cardiovascular:     Rate and Rhythm: Normal rate and regular rhythm.     Heart sounds: Normal heart sounds.  Pulmonary:     Breath sounds: Normal breath sounds.  Musculoskeletal:     Comments: Lower extremities: No bruising or erythema.  No significant swelling or tenderness.  Limited range of motion due to pain.  Otherwise within normal limits.  Skin:    General: Skin is warm and dry.  Neurological:     General: No focal deficit present.     Mental Status: She is alert and oriented to person, place, and time.  Psychiatric:        Mood and Affect: Mood normal.        Behavior: Behavior normal.    A total of 25 minutes was spent in the room with the patient, greater than 50% of which was in counseling/coordination of care regarding diagnosis: Treatment, medications, prognosis, and need for follow-up.   ASSESSMENT & PLAN: Donnabelle was seen today for legs.  Diagnoses and all orders for this visit:  Bilateral leg pain -     ketorolac (TORADOL) injection 60 mg -     traMADol (ULTRAM) 50 MG tablet; Take 1 tablet (50 mg total) by mouth every 8 (eight) hours as needed.  Primary osteoarthritis of both knees -     traMADol (ULTRAM) 50 MG tablet; Take 1 tablet (50 mg total) by mouth every 8 (eight) hours as needed.    Patient Instructions       If you have lab work done today you will be contacted with your lab results within the next  2 weeks.  If you have not heard from [place] then please contact us. The fastest way to get your results is to register for My Chart.   IF you received an x-ray today, you will receive an invoice from [facility]. Please contact [facility] at [phone] with questions or concerns regarding your invoice.   IF you received labwork today, you will receive an invoice from [facility]. Please contact [facility] at [phone] with questions or concerns regarding your invoice.   Our billing staff will not be able to assist you with questions regarding bills from these companies.  You will be contacted with the lab results as soon as they are available. The fastest way to get your results is to activate your My Chart account. Instructions are located on the last page of this paperwork. If you have not heard from [place] regarding the results in 2 weeks, please contact this office.     Osteoarthritis  Osteoarthritis is a type of arthritis that affects tissue that covers the ends of bones in joints (cartilage). Cartilage acts as a cushion between the bones and helps them move smoothly. Osteoarthritis results when cartilage in the joints gets worn down. Osteoarthritis is sometimes called "wear and tear" arthritis. Osteoarthritis is the most common form of arthritis. It often occurs in older people. It is a condition that gets worse over time (a progressive condition). Joints that are most often affected by this condition are in:  Fingers.  Toes.  Hips.  Knees.  Spine, including neck and lower back. What are the causes? This condition is caused by age-related wearing down of cartilage that covers the ends of bones. What increases the risk? The following factors may make you more likely to develop this condition:  Older age.  Being overweight or obese.  Overuse of joints,  such as in athletes.  Past injury of a joint.  Past surgery on a joint.  Family history of  osteoarthritis. What are the signs or symptoms? The main symptoms of this condition are pain, swelling, and stiffness in the joint. The joint may lose its shape over time. Small pieces of bone or cartilage may break off and float inside of the joint, which may cause more pain and damage to the joint. Small deposits of bone (osteophytes) may grow on the edges of the joint. Other symptoms may include:  A grating or scraping feeling inside the joint when you move it.  Popping or creaking sounds when you move. Symptoms may affect one or more joints. Osteoarthritis in a major joint, such as your knee or hip, can make it painful to walk or exercise. If you have osteoarthritis in your hands, you might not be able to grip items, twist your hand, or control small movements of your hands and fingers (fine motor skills). How is this diagnosed? This condition may be diagnosed based on:  Your medical history.  A physical exam.  Your symptoms.  X-rays of the affected joint(s).  Blood tests to rule out other types of arthritis. How is this treated? There is no cure for this condition, but treatment can help to control pain and improve joint function. Treatment plans may include:  A prescribed exercise program that allows for rest and joint relief. You may work with a physical therapist.  A weight control plan.  Pain relief techniques, such as: ? Applying heat and cold to the joint. ? Electric pulses delivered to nerve endings under the skin (transcutaneous electrical nerve stimulation, or TENS). ? Massage. ? Certain nutritional supplements.  NSAIDs or prescription medicines to help relieve pain.  Medicine to help relieve pain and inflammation (corticosteroids). This can be given by mouth (orally) or as an injection.  Assistive devices, such as a brace, wrap, splint, specialized glove, or cane.  Surgery, such as: ? An osteotomy. This is done to reposition the bones and relieve pain or to  remove loose pieces of bone and cartilage. ? Joint replacement surgery. You may need this surgery if you have very bad (advanced) osteoarthritis. Follow these instructions at home: Activity  Rest your affected joints as directed by your health care provider.  Do not drive or use heavy machinery while taking prescription pain medicine.  Exercise as directed. Your health care provider or physical therapist may recommend specific types of exercise, such as: ? Strengthening exercises. These are done to strengthen the muscles that support joints that are affected by arthritis. They can be performed with weights or with exercise bands to add resistance. ? Aerobic activities. These are exercises, such as brisk walking or water aerobics, that get your heart pumping. ? Range-of-motion activities. These keep your joints easy to move. ? Balance and agility exercises. Managing pain, stiffness, and swelling      If directed, apply heat to the affected area as often as told by your health care provider. Use the heat source that your health care provider recommends, such as a moist heat pack or a heating pad. ? If you have a removable assistive device, remove it as told by your health care provider. ? Place a towel between your skin and the heat source. If your health care provider tells you to keep the assistive device on while you apply heat, place a towel between the assistive device and the heat source. ? Leave the heat on  for 20-30 minutes. ? Remove the heat if your skin turns bright red. This is especially important if you are unable to feel pain, heat, or cold. You may have a greater risk of getting burned.  If directed, put ice on the affected joint: ? If you have a removable assistive device, remove it as told by your health care provider. ? Put ice in a plastic bag. ? Place a towel between your skin and the bag. If your health care provider tells you to keep the assistive device on during  icing, place a towel between the assistive device and the bag. ? Leave the ice on for 20 minutes, 2-3 times a day. General instructions  Take over-the-counter and prescription medicines only as told by your health care provider.  Maintain a healthy weight. Follow instructions from your health care provider for weight control. These may include dietary restrictions.  Do not use any products that contain nicotine or tobacco, such as cigarettes and e-cigarettes. These can delay bone healing. If you need help quitting, ask your health care provider.  Use assistive devices as directed by your health care provider.  Keep all follow-up visits as told by your health care provider. This is important. Where to find more information  [employer] of Arthritis and Musculoskeletal and Skin Diseases: www.niams.South[employer]posed.es  [employer] on Aging: http://kim-miller.com/  American College of Rheumatology: www.rheumatology.org Contact a health care provider if:  Your skin turns red.  You develop a rash.  You have pain that gets worse.  You have a fever along with joint or muscle aches. Get help right away if:  You lose a lot of weight.  You suddenly lose your appetite.  You have night sweats. Summary  Osteoarthritis is a type of arthritis that affects tissue covering the ends of bones in joints (cartilage).  This condition is caused by age-related wearing down of cartilage that covers the ends of bones.  The main symptom of this condition is pain, swelling, and stiffness in the joint.  There is no cure for this condition, but treatment can help to control pain and improve joint function. This information is not intended to replace advice given to you by your health care provider. Make sure you discuss any questions you have with your health care provider. Document Released: 06/14/2005 Document Revised: 03/21/2017 Document Reviewed: 02/16/2016 Elsevier Interactive Patient Education   2019 [employer]      Agustina Caroli, MD [facility]

## 2018-07-11 ENCOUNTER — Other Ambulatory Visit: Payer: Self-pay | Admitting: Internal Medicine

## 2018-07-11 DIAGNOSIS — R928 Other abnormal and inconclusive findings on diagnostic imaging of breast: Secondary | ICD-10-CM

## 2018-07-21 ENCOUNTER — Ambulatory Visit
Admission: RE | Admit: 2018-07-21 | Discharge: 2018-07-21 | Disposition: A | Discharge status: Home / Self Care | Payer: Medicare Other | Source: Ambulatory Visit | Attending: Internal Medicine | Admitting: Internal Medicine

## 2018-07-21 ENCOUNTER — Other Ambulatory Visit: Payer: Self-pay | Admitting: Internal Medicine

## 2018-07-21 DIAGNOSIS — R928 Other abnormal and inconclusive findings on diagnostic imaging of breast: Secondary | ICD-10-CM

## 2018-07-21 DIAGNOSIS — N6310 Unspecified lump in the right breast, unspecified quadrant: Secondary | ICD-10-CM

## 2018-07-24 ENCOUNTER — Ambulatory Visit
Admission: RE | Admit: 2018-07-24 | Discharge: 2018-07-24 | Disposition: A | Discharge status: Home / Self Care | Payer: Medicare Other | Source: Ambulatory Visit | Attending: Internal Medicine | Admitting: Internal Medicine

## 2018-07-24 DIAGNOSIS — N6310 Unspecified lump in the right breast, unspecified quadrant: Secondary | ICD-10-CM

## 2018-07-24 HISTORY — PX: BREAST BIOPSY: SHX20

## 2018-07-26 ENCOUNTER — Telehealth: Payer: Self-pay | Admitting: Oncology

## 2018-07-26 ENCOUNTER — Encounter: Payer: Self-pay | Admitting: *Deleted

## 2018-07-26 NOTE — Telephone Encounter (Signed)
Spoke to patient to confirm morning BC appointment for 2/5, packet will be mailed to patient °

## 2018-07-27 ENCOUNTER — Encounter: Payer: Self-pay | Admitting: *Deleted

## 2018-07-27 DIAGNOSIS — D0511 Intraductal carcinoma in situ of right breast: Secondary | ICD-10-CM | POA: Insufficient documentation

## 2018-08-01 NOTE — Progress Notes (Signed)
[place]  [phone] [phone]) [phone]     ID: Christina Hernandez DOB: July 19, 1950  MR#: [id number]  XUX#:[id number]  Patient Care Team: Glendale Chard, MD as PCP - General (Internal Medicine) [name], MD as Consulting Physician (Obstetrics and Gynecology) [name], MD as Consulting Physician (General Surgery) Stacie Knutzen, [name], MD as Consulting Physician (Oncology) [name], MD as Consulting Physician (Radiation Oncology) Penni Bombard, MD as Consulting Physician (Neurology) [name], MD as Consulting Physician (Pulmonary Disease) Chauncey Cruel, MD OTHER MD:  CHIEF COMPLAINT: Estrogen receptor positive breast cancer  CURRENT TREATMENT: Awaiting definitive surgery  HISTORY OF CURRENT ILLNESS: Christina Hernandez had routine screening mammography on 07/10/2018 showing a possible abnormality in the right breast. She underwent bilateral diagnostic mammography with tomography and right breast ultrasonography at [facility] on 07/21/2018 showing: indeterminate mass at the 3:30 position of the right breast measuring 3 x 4 x 4 mm; normal right axilla.  Accordingly on 07/24/2018 she proceeded to biopsy of the right breast area in question. The pathology ([id number]) from this procedure showed: ductal carcinoma in situ, low grade, may be involving an underlying papillary lesion. Prognostic indicators significant for: estrogen receptor, 100% positive and progesterone receptor, 100% positive or negative, both with strong staining intensity.   The patient's subsequent history is as detailed below.   INTERVAL HISTORY: Tamiko Leopard was evaluated in the multidisciplinary breast cancer clinic on 08/02/2018 accompanied by her daughter Christina Hernandez and her husband Christina Hernandez. Her case was also presented at the multidisciplinary breast cancer conference on the same day. At that time a preliminary plan was proposed: right lumpectomy, adjuvant  radiotherapy, aromatase inhibitor   REVIEW OF SYSTEMS: [name] reports doing well overall. Per patient form, she reports fatigue, cramping, wearing glasses, change in vision with blurred vision, sore throat with hoarse voice, abnormal moles, joint pain and arthritis, diabetes, and thyroid problems. There were no specific symptoms leading to the original mammogram, which was routinely scheduled. The patient denies unusual headaches, visual changes, nausea, vomiting, stiff neck, dizziness, or gait imbalance. There has been no cough, phlegm production, or pleurisy, no chest pain or pressure, and no change in bowel or bladder habits. The patient denies fever, rash, bleeding, unexplained fatigue or unexplained weight loss. A detailed review of systems was otherwise entirely negative.   PAST MEDICAL HISTORY: Past Medical History:  Diagnosis Date  . Arthritis   . Diabetes ([place])    type 2  . GERD (gastroesophageal reflux disease)    rarely occurs- no meds  . Hypertension   . Hypothyroidism   . Shortness of breath    pt states sob on exertion because she is "out of shape"  . Trigeminal neuralgia of left side of face 2015   s/p Gamma knife  She has a stable lung nodule  PAST SURGICAL HISTORY: Past Surgical History:  Procedure Laterality Date  . COLONOSCOPY    . Gamma Knife Left 04/08/2016  . HYSTEROSCOPY W/D&C  05/24/2011   Procedure: DILATATION AND CURETTAGE (D&C) /HYSTEROSCOPY;  Surgeon: [name], MD;  Location: [facility];  Service: Gynecology;  Laterality: N/A;  . KNEE SURGERY Right 06/10/15   torn ligament  . THYROIDECTOMY N/A 09/08/2016   Procedure: TOTAL THYROIDECTOMY;  Surgeon: [name], MD;  Location: [facility];  Service: ENT;  Laterality: N/A;  . TUBAL LIGATION    . uterine polyp    . WISDOM TOOTH EXTRACTION Left 2014    FAMILY HISTORY Family History  Problem  Relation Age of Onset  . Hypertension Mother   . Heart disease Mother   . Kidney failure Mother   .  Heart Problems Father   . Stroke Father   . Pneumonia Father   . Heart Problems Sister   . Cardiomyopathy Sister   . Ovarian cancer Sister   . Breast cancer Neg Hx    (as of 08/02/2018) Patient father was 34 years old when he died from pneumonia and a stroke. Patient mother died from possible heart attack at age 64.  The patient denies a family hx of breast cancer. A sister was diagnosed with ovarian cancer, and has since passed away at age 32.  The patient has 5 siblings, 5 sisters and 0 brothers.  GYNECOLOGIC HISTORY:  No LMP recorded. Patient is postmenopausal. Menarche: 68 years old Age at first live birth: 68 years old [place] P 4 LMP 1996 Contraceptive yes HRT no  Hysterectomy? Yes, 5-6 years ago (per patient) BSO? 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 Husband Christina Hernandez is a retired English as a second language teacher. [name], age 49, is a NP for an opioid clinic in [place]. Son Christina Hernandez, age 85, is a Nature conservation officer in [place]. Daughter Christina Hernandez, age 95, works as a Software engineer for [employer]. Son [name], age 32, works in Land in [place].  The patient has no grandchildren.  She belongs to [employer], and she is Information systems manager.     ADVANCED DIRECTIVES: Husband Christina Hernandez is her HCPOA.   HEALTH MAINTENANCE: Social History   Tobacco Use  . Smoking status: Never Smoker  . Smokeless tobacco: Never Used  Substance Use Topics  . Alcohol use: No  . Drug use: No     Colonoscopy: 2019, Dr. Collene Mares  PAP: 03/21/2011, normal  Bone density: unknown   Allergies  Allergen Reactions  . Cyclobenzaprine Nausea Only    Current Outpatient Medications  Medication Sig Dispense Refill  . acetaminophen (TYLENOL) 500 MG tablet Take 1 tablet (500 mg total) by mouth every 8 (eight) hours as needed. 30 tablet 1  . Biotin 5000 MCG CAPS Take 1 capsule by mouth daily.     . cetirizine (ZYRTEC) 10 MG tablet Take 1 tablet (10 mg total) by mouth daily. (Patient taking  differently: Take 10 mg by mouth daily as needed for allergies. ) 30 tablet 3  . DULoxetine (CYMBALTA) 60 MG capsule Take 1 capsule (60 mg total) by mouth daily. 90 capsule 4  . gabapentin (NEURONTIN) 300 MG capsule Take 3 capsules (900 mg total) by mouth 3 (three) times daily. 810 capsule 4  . ibuprofen (ADVIL,MOTRIN) 400 MG tablet Take by mouth.    . losartan-hydrochlorothiazide (HYZAAR) 50-12.5 MG per tablet Take 1 tablet by mouth daily.      . Multiple Vitamin (MULTIVITAMIN WITH MINERALS) TABS tablet Take 1 tablet by mouth daily.    . NONFORMULARY OR COMPOUNDED ITEM Compounded cream for right knee as needed    . pravastatin (PRAVACHOL) 40 MG tablet Take 40 mg by mouth daily.    . Saxagliptin-Metformin (KOMBIGLYZE XR) 5-500 MG TB24 Take 1 tablet by mouth daily before supper. 90 tablet 1  . SYNTHROID 75 MCG tablet TAKE 1 TABLET DAILY MON-SAT, HOLD ON SUNDAY. 26 tablet 4  . traMADol (ULTRAM) 50 MG tablet Take 1 tablet (50 mg total) by mouth every 8 (eight) hours as needed. 20 tablet 0   No current facility-administered medications for this visit.     OBJECTIVE: Middle-aged African-American  woman, in no acute distress  Vitals:   08/02/18 0849  BP: 138/67  Pulse: 95  Resp: 18  Temp: 97.8 F (36.6 C)  SpO2: 98%     Body mass index is 30.05 kg/m.   Wt Readings from Last 3 Encounters:  08/02/18 177 lb 12.8 oz (80.6 kg)  07/10/18 179 lb 12.8 oz (81.6 kg)  07/04/18 181 lb 12.8 oz (82.5 kg)      ECOG FS:1 - Symptomatic but completely ambulatory  Ocular: Sclerae unicteric, pupils round and equal Lymphatic: No cervical or supraclavicular adenopathy Lungs no rales or rhonchi Heart regular rate and rhythm Abd soft, nontender, positive bowel sounds MSK no focal spinal tenderness, no joint edema Neuro: non-focal, well-oriented, appropriate affect Breasts: The right breast is status post recent biopsy.  There is no palpable mass.  The left breast is benign.  Both axillae are  benign.   LAB RESULTS:  CMP     Component Value Date/Time   NA 144 08/02/2018 0835   NA 143 05/10/2018 1156   K 3.4 (L) 08/02/2018 0835   CL 108 08/02/2018 0835   CO2 27 08/02/2018 0835   GLUCOSE 109 (H) 08/02/2018 0835   BUN 14 08/02/2018 0835   BUN 15 05/10/2018 1156   CREATININE 0.77 08/02/2018 0835   CALCIUM 9.3 08/02/2018 0835   PROT 7.2 08/02/2018 0835   PROT 7.5 05/10/2018 1156   ALBUMIN 3.7 08/02/2018 0835   ALBUMIN 4.4 05/10/2018 1156   AST 18 08/02/2018 0835   ALT 22 08/02/2018 0835   ALKPHOS 97 08/02/2018 0835   BILITOT 0.7 08/02/2018 0835   GFRNONAA >60 08/02/2018 0835   GFRAA >60 08/02/2018 0835    No results found for: TOTALPROTELP, ALBUMINELP, A1GS, A2GS, BETS, BETA2SER, GAMS, MSPIKE, SPEI  No results found for: Nils Pyle, [facility]  Lab Results  Component Value Date   WBC 5.2 08/02/2018   NEUTROABS 2.8 08/02/2018   HGB 12.0 08/02/2018   HCT 37.5 08/02/2018   MCV 85.8 08/02/2018   PLT 245 08/02/2018    _0 @  No results found for: LABCA2  No components found for: QZRAQT622  No results for input(s): INR in the last 168 hours.  No results found for: LABCA2  No results found for: QJF354  No results found for: TGY563  No results found for: SLH734  No results found for: CA2729  No components found for: HGQUANT  No results found for: CEA1 / No results found for: CEA1   No results found for: AFPTUMOR  No results found for: CHROMOGRNA  No results found for: PSA1  Appointment on 08/02/2018  Component Date Value Ref Range Status  . WBC Count 08/02/2018 5.2  4.0 - 10.5 K/uL Final  . RBC 08/02/2018 4.37  3.87 - 5.11 MIL/uL Final  . Hemoglobin 08/02/2018 12.0  12.0 - 15.0 g/dL Final  . HCT 08/02/2018 37.5  36.0 - 46.0 % Final  . MCV 08/02/2018 85.8  80.0 - 100.0 fL Final  . MCH 08/02/2018 27.5  26.0 - 34.0 pg Final  . MCHC 08/02/2018 32.0  30.0 - 36.0 g/dL Final  . RDW 08/02/2018 13.3  11.5 - 15.5 % Final   . Platelet Count 08/02/2018 245  150 - 400 K/uL Final  . nRBC 08/02/2018 0.0  0.0 - 0.2 % Final  . Neutrophils Relative % 08/02/2018 54  % Final  . Neutro Abs 08/02/2018 2.8  1.7 - 7.7 K/uL Final  . Lymphocytes Relative 08/02/2018 39  % Final  .  Lymphs Abs 08/02/2018 2.0  0.7 - 4.0 K/uL Final  . Monocytes Relative 08/02/2018 5  % Final  . Monocytes Absolute 08/02/2018 0.3  0.1 - 1.0 K/uL Final  . Eosinophils Relative 08/02/2018 2  % Final  . Eosinophils Absolute 08/02/2018 0.1  0.0 - 0.5 K/uL Final  . Basophils Relative 08/02/2018 0  % Final  . Basophils Absolute 08/02/2018 0.0  0.0 - 0.1 K/uL Final  . Immature Granulocytes 08/02/2018 0  % Final  . Abs Immature Granulocytes 08/02/2018 0.02  0.00 - 0.07 K/uL Final   Performed at [facility], [place] [address], [place], [place] [address]  . Sodium 08/02/2018 144  135 - 145 mmol/L Final  . Potassium 08/02/2018 3.4* 3.5 - 5.1 mmol/L Final  . Chloride 08/02/2018 108  98 - 111 mmol/L Final  . CO2 08/02/2018 27  22 - 32 mmol/L Final  . Glucose, Bld 08/02/2018 109* 70 - 99 mg/dL Final  . BUN 08/02/2018 14  8 - 23 mg/dL Final  . Creatinine 08/02/2018 0.77  0.44 - 1.00 mg/dL Final  . Calcium 08/02/2018 9.3  8.9 - 10.3 mg/dL Final  . Total Protein 08/02/2018 7.2  6.5 - 8.1 g/dL Final  . Albumin 08/02/2018 3.7  3.5 - 5.0 g/dL Final  . AST 08/02/2018 18  15 - 41 U/L Final  . ALT 08/02/2018 22  0 - 44 U/L Final  . Alkaline Phosphatase 08/02/2018 97  38 - 126 U/L Final  . Total Bilirubin 08/02/2018 0.7  0.3 - 1.2 mg/dL Final  . GFR, Est Non Af Am 08/02/2018 >60  >60 mL/min Final  . GFR, Est AFR Am 08/02/2018 >60  >60 mL/min Final  . Anion gap 08/02/2018 9  5 - 15 Final   Performed at [facility], [name] [place], [place] [address]    (this displays the last labs from the last 3 days)  No results found for: TOTALPROTELP, ALBUMINELP, A1GS, A2GS, BETS, BETA2SER, GAMS, MSPIKE, SPEI (this  displays SPEP labs)  No results found for: KPAFRELGTCHN, LAMBDASER, KAPLAMBRATIO (kappa/lambda light chains)  No results found for: HGBA, HGBA2QUANT, HGBFQUANT, HGBSQUAN (Hemoglobinopathy evaluation)   No results found for: LDH  No results found for: IRON, TIBC, IRONPCTSAT (Iron and TIBC)  No results found for: FERRITIN  Urinalysis    Component Value Date/Time   COLORURINE YELLOW 08/17/2007 1404   APPEARANCEUR CLEAR 08/17/2007 1404   LABSPEC 1.010 08/17/2007 1404   PHURINE 7.0 08/17/2007 1404   GLUCOSEU NEGATIVE 08/17/2007 1404   HGBUR NEGATIVE 08/17/2007 1404   BILIRUBINUR negative 05/10/2018 1221   KETONESUR negative 04/12/2016 1147   KETONESUR NEGATIVE 08/17/2007 1404   PROTEINUR Negative 05/10/2018 1221   PROTEINUR NEGATIVE 08/17/2007 1404   UROBILINOGEN 0.2 05/10/2018 1221   UROBILINOGEN 0.2 08/17/2007 1404   NITRITE negative 05/10/2018 1221   NITRITE NEGATIVE 08/17/2007 1404   LEUKOCYTESUR Negative 05/10/2018 1221     STUDIES: US Breast Ltd Uni Right Inc Axilla  Result Date: 07/21/2018 CLINICAL DATA:  Patient presents for additional views of the right breast as follow-up to recent screening exam to evaluate a possible mass. EXAM: DIGITAL DIAGNOSTIC right MAMMOGRAM WITH TOMO ULTRASOUND right BREAST COMPARISON:  Previous exam(s). ACR Breast Density Category c: The breast tissue is heterogeneously dense, which may obscure small masses. FINDINGS: Additional spot compression tomographic images demonstrate 2 adjacent oval mostly circumscribed subcentimeter masses over the inner lower right breast. Targeted ultrasound is performed, showing an oval cyst in the  deeper position at the 3:30 position of the right breast 2 cm from the nipple measuring 3 x 6 x 6 mm corresponding to the larger deeper mass seen mammographically. Just superficial to this mass is a more indeterminate appearing hypoechoic mass with slightly irregular shape and borders measuring 3 x 4 x 4 mm. Ultrasound of  the right axilla is normal. IMPRESSION: Indeterminate mass more superficially located over the 3:30 position of the right breast 2 cm from the nipple measuring 3 x 4 x 4 mm. Adjacent 6 mm cyst located deeper at the 3:30 position of the right breast 2 cm from the nipple. RECOMMENDATION: Recommend ultrasound-guided core needle biopsy of the indeterminate 4 mm mass at the 3:30 position of the right breast. I have discussed the findings and recommendations with the patient. Results were also provided in writing at the conclusion of the visit. If applicable, a reminder letter will be sent to the patient regarding the next appointment. BI-RADS CATEGORY  4: Suspicious. Biopsy will be scheduled here at the [facility] prior to patient's departure. Electronically Signed   By: Marin Olp M.D.   On: 07/21/2018 12:36   Mm Diag Breast Tomo Uni Right  Result Date: 07/21/2018 CLINICAL DATA:  Patient presents for additional views of the right breast as follow-up to recent screening exam to evaluate a possible mass. EXAM: DIGITAL DIAGNOSTIC right MAMMOGRAM WITH TOMO ULTRASOUND right BREAST COMPARISON:  Previous exam(s). ACR Breast Density Category c: The breast tissue is heterogeneously dense, which may obscure small masses. FINDINGS: Additional spot compression tomographic images demonstrate 2 adjacent oval mostly circumscribed subcentimeter masses over the inner lower right breast. Targeted ultrasound is performed, showing an oval cyst in the deeper position at the 3:30 position of the right breast 2 cm from the nipple measuring 3 x 6 x 6 mm corresponding to the larger deeper mass seen mammographically. Just superficial to this mass is a more indeterminate appearing hypoechoic mass with slightly irregular shape and borders measuring 3 x 4 x 4 mm. Ultrasound of the right axilla is normal. IMPRESSION: Indeterminate mass more superficially located over the 3:30 position of the right breast 2 cm from the nipple  measuring 3 x 4 x 4 mm. Adjacent 6 mm cyst located deeper at the 3:30 position of the right breast 2 cm from the nipple. RECOMMENDATION: Recommend ultrasound-guided core needle biopsy of the indeterminate 4 mm mass at the 3:30 position of the right breast. I have discussed the findings and recommendations with the patient. Results were also provided in writing at the conclusion of the visit. If applicable, a reminder letter will be sent to the patient regarding the next appointment. BI-RADS CATEGORY  4: Suspicious. Biopsy will be scheduled here at the [facility] prior to patient's departure. Electronically Signed   By: Marin Olp M.D.   On: 07/21/2018 12:36   Mm 3d Screen Breast Bilateral  Result Date: 07/10/2018 CLINICAL DATA:  Screening. EXAM: DIGITAL SCREENING BILATERAL MAMMOGRAM WITH TOMO AND CAD COMPARISON:  Previous exam(s). ACR Breast Density Category c: The breast tissue is heterogeneously dense, which may obscure small masses. FINDINGS: In the right breast, a possible mass in the INNER breast adjacent to a stable benign intramammary node warrants further evaluation. In the left breast, no findings suspicious for malignancy. Images were processed with CAD. IMPRESSION: Further evaluation is suggested for possible mass in the INNER right breast adjacent to a stable benign intramammary node. RECOMMENDATION: Diagnostic mammogram and possibly ultrasound of  the right breast. (Code:[id number]) The patient will be contacted regarding the findings, and additional imaging will be scheduled. BI-RADS CATEGORY  0: Incomplete. Need additional imaging evaluation and/or prior mammograms for comparison. Electronically Signed   By: Evangeline Dakin M.D.   On: 07/10/2018 16:29   Mm Clip Placement Right  Result Date: 07/24/2018 CLINICAL DATA:  Status post ultrasound-guided core biopsy of a right breast mass. EXAM: DIAGNOSTIC RIGHT MAMMOGRAM POST ULTRASOUND BIOPSY COMPARISON:  Previous exam(s).  FINDINGS: Mammographic images were obtained following ultrasound guided biopsy of a mass in the 3:30 region of the right breast. Mammographic images show there is a ribbon shaped clip in appropriate position in the medial aspect of the right breast. IMPRESSION: Status post ultrasound-guided core biopsy of the right breast with pathology pending. Final Assessment: Post Procedure Mammograms for Marker Placement Electronically Signed   By: Lillia Mountain M.D.   On: 07/24/2018 15:49   [place] Rt Breast Bx W Loc Dev 1st Lesion Img Bx Spec US Guide  Addendum Date: 07/26/2018   ADDENDUM REPORT: 07/25/2018 15:24 ADDENDUM: Pathology revealed LOW GRADE DUCTAL CARCINOMA IN SITU of the Right breast, 3:30. The carcinoma may be involving an underlying papillary lesion. This was found to be concordant by Dr. Lillia Mountain. Pathology results were discussed with the patient by telephone. The patient reported doing well after the biopsy with tenderness at the site. Post biopsy instructions and care were reviewed and questions were answered. The patient was encouraged to call [facility] for any additional concerns. The patient was referred to [facility] on August 02, 2018. Pathology results reported by Terie Purser, RN on 07/25/2018. Electronically Signed   By: Lillia Mountain M.D.   On: 07/25/2018 15:24   Result Date: 07/26/2018 CLINICAL DATA:  Right breast mass. EXAM: ULTRASOUND GUIDED RIGHT BREAST CORE NEEDLE BIOPSY COMPARISON:  Previous exam(s). FINDINGS: I met with the patient and we discussed the procedure of ultrasound-guided biopsy, including benefits and alternatives. We discussed the high likelihood of a successful procedure. We discussed the risks of the procedure, including infection, bleeding, tissue injury, clip migration, and inadequate sampling. Informed written consent was given. The usual time-out protocol was performed  immediately prior to the procedure. Lesion quadrant: Right lower inner quadrant Using sterile technique and 1% lidocaine and 1% lidocaine with epinephrine as local anesthetic, under direct ultrasound visualization, a 12 gauge spring-loaded device was used to perform biopsy of a mass in the 3:30 region of the right breast using an inferior to superior approach. At the conclusion of the procedure a ribbon shaped tissue marker clip was deployed into the biopsy cavity. Follow up 2 view mammogram was performed and dictated separately. IMPRESSION: Ultrasound guided biopsy of the right breast. No apparent complications. Electronically Signed: By: Lillia Mountain M.D. On: 07/24/2018 15:42    ELIGIBLE FOR AVAILABLE RESEARCH PROTOCOL: no  ASSESSMENT: 68 y.o. [place] woman status post right breast biopsy 07/24/2022 ductal carcinoma in situ, grade 1, strongly estrogen and progesterone receptor positive.  (1) definitive surgery pending.  (2) adjuvant radiation to follow  (3) consider antiestrogens at the completion of local treatment.  (4) genetics testing in process    PLAN: I spent approximately 60 minutes face to face with Edom with more than 50% of that time spent in counseling and coordination of care. Specifically we reviewed the biology of the patient's diagnosis and the specifics of her situation.  Wladyslawa understands that in  noninvasive ductal carcinoma, also called ductal carcinoma in situ ("DCIS") the breast cancer cells remain trapped in the ducts were they started. They cannot travel to a vital organ. For that reason these cancers in themselves are not life-threatening.  If the whole breast is removed then all the ducts are removed and since the cancer cells are trapped in the ducts, the cure rate with mastectomy for noninvasive breast cancer is approximately 99%. Nevertheless we recommend lumpectomy, because there is no survival advantage to mastectomy and because the cosmetic result is  generally superior with breast conservation.  Since the patient is keeping her breast, there will be some risk of recurrence. The recurrence can only be in the same breast since, again, the cells are trapped in the ducts. There is no connection from one breast to the other. The risk of local recurrence is cut by more than half with radiation, which is standard in this situation.  In estrogen receptor positive cancers like Rhyleigh''s, anti-estrogens can also be considered. They will further reduce the risk of recurrence by one half. In addition anti-estrogens will lower the risk of a new breast cancer developing in either breast, also by one half. That risk otherwise approaches 1% per year.   Accordingly the overall plan is for surgery, followed by radiation, then a discussion of anti-estrogens.  Jaeleigh does qualify for genetics testing. In patients who carry a deleterious mutation [for example in a  BRCA gene], the risk of a new breast cancer developing in the future may be sufficiently great that the patient may choose bilateral mastectomies. However if she wishes to keep her breasts in that situation it is safe to do so. That would require intensified screening, which generally means not only yearly mammography but a yearly breast MRI as well.   Shaniya has a good understanding of the overall plan. She agrees with it. She knows the goal of treatment in her case is cure. She will call with any problems that may develop before her next visit here.  Chauncey Cruel, MD   08/02/2018 2:41 PM Medical Oncology and Hematology [facility] [address] [place], [place] [address] Tel. [phone]    Fax. [phone]  This document serves as a record of services personally performed by Lurline Del, MD. It was created on his behalf by Wilburn Mylar, a trained medical scribe. The creation of this record is based on the scribe's personal observations and the provider's statements to  them.   I, Lurline Del MD, have reviewed the above documentation for accuracy and completeness, and I agree with the above.

## 2018-08-02 ENCOUNTER — Ambulatory Visit
Admission: RE | Admit: 2018-08-02 | Discharge: 2018-08-02 | Disposition: A | Discharge status: Home / Self Care | Payer: Medicare Other | Source: Ambulatory Visit | Attending: Radiation Oncology | Admitting: Radiation Oncology

## 2018-08-02 ENCOUNTER — Encounter: Payer: Self-pay | Admitting: *Deleted

## 2018-08-02 ENCOUNTER — Encounter: Payer: Self-pay | Admitting: Oncology

## 2018-08-02 ENCOUNTER — Ambulatory Visit: Payer: Medicare Other | Admitting: Physical Therapy

## 2018-08-02 ENCOUNTER — Other Ambulatory Visit: Payer: Self-pay | Admitting: Surgery

## 2018-08-02 ENCOUNTER — Inpatient Hospital Stay: Payer: Medicare Other | Attending: Oncology | Admitting: Oncology

## 2018-08-02 ENCOUNTER — Inpatient Hospital Stay: Payer: Medicare Other

## 2018-08-02 VITALS — BP 138/67 | HR 95 | Temp 97.8°F | Resp 18 | Ht 64.5 in | Wt 177.8 lb

## 2018-08-02 DIAGNOSIS — K219 Gastro-esophageal reflux disease without esophagitis: Secondary | ICD-10-CM | POA: Diagnosis not present

## 2018-08-02 DIAGNOSIS — D0511 Intraductal carcinoma in situ of right breast: Secondary | ICD-10-CM | POA: Insufficient documentation

## 2018-08-02 DIAGNOSIS — G500 Trigeminal neuralgia: Secondary | ICD-10-CM | POA: Diagnosis not present

## 2018-08-02 DIAGNOSIS — R918 Other nonspecific abnormal finding of lung field: Secondary | ICD-10-CM | POA: Diagnosis not present

## 2018-08-02 DIAGNOSIS — Z170 Estrogen receptor positive status [ER+]: Secondary | ICD-10-CM

## 2018-08-02 DIAGNOSIS — I10 Essential (primary) hypertension: Secondary | ICD-10-CM | POA: Diagnosis not present

## 2018-08-02 DIAGNOSIS — Z79899 Other long term (current) drug therapy: Secondary | ICD-10-CM | POA: Diagnosis not present

## 2018-08-02 DIAGNOSIS — E119 Type 2 diabetes mellitus without complications: Secondary | ICD-10-CM

## 2018-08-02 DIAGNOSIS — E039 Hypothyroidism, unspecified: Secondary | ICD-10-CM

## 2018-08-02 DIAGNOSIS — M129 Arthropathy, unspecified: Secondary | ICD-10-CM | POA: Insufficient documentation

## 2018-08-02 DIAGNOSIS — D0591 Unspecified type of carcinoma in situ of right breast: Secondary | ICD-10-CM

## 2018-08-02 LAB — CBC WITH DIFFERENTIAL (CANCER CENTER ONLY)
Abs Immature Granulocytes: 0.02 10*3/uL (ref 0.00–0.07)
Basophils Absolute: 0 10*3/uL (ref 0.0–0.1)
Basophils Relative: 0 %
Eosinophils Absolute: 0.1 10*3/uL (ref 0.0–0.5)
Eosinophils Relative: 2 %
HCT: 37.5 % (ref 36.0–46.0)
Hemoglobin: 12 g/dL (ref 12.0–15.0)
Immature Granulocytes: 0 %
Lymphocytes Relative: 39 %
Lymphs Abs: 2 10*3/uL (ref 0.7–4.0)
MCH: 27.5 pg (ref 26.0–34.0)
MCHC: 32 g/dL (ref 30.0–36.0)
MCV: 85.8 fL (ref 80.0–100.0)
Monocytes Absolute: 0.3 10*3/uL (ref 0.1–1.0)
Monocytes Relative: 5 %
Neutro Abs: 2.8 10*3/uL (ref 1.7–7.7)
Neutrophils Relative %: 54 %
Platelet Count: 245 10*3/uL (ref 150–400)
RBC: 4.37 MIL/uL (ref 3.87–5.11)
RDW: 13.3 % (ref 11.5–15.5)
WBC Count: 5.2 10*3/uL (ref 4.0–10.5)
nRBC: 0 % (ref 0.0–0.2)

## 2018-08-02 LAB — CMP (CANCER CENTER ONLY)
ALT: 22 U/L (ref 0–44)
AST: 18 U/L (ref 15–41)
Albumin: 3.7 g/dL (ref 3.5–5.0)
Alkaline Phosphatase: 97 U/L (ref 38–126)
Anion gap: 9 (ref 5–15)
BUN: 14 mg/dL (ref 8–23)
CO2: 27 mmol/L (ref 22–32)
Calcium: 9.3 mg/dL (ref 8.9–10.3)
Chloride: 108 mmol/L (ref 98–111)
Creatinine: 0.77 mg/dL (ref 0.44–1.00)
GFR, Est AFR Am: >60 mL/min (ref >60)
GFR, Estimated: >60 mL/min (ref >60)
Glucose, Bld: 109 mg/dL — ABNORMAL HIGH (ref 70–99)
Potassium: 3.4 mmol/L — ABNORMAL LOW (ref 3.5–5.1)
Sodium: 144 mmol/L (ref 135–145)
Total Bilirubin: 0.7 mg/dL (ref 0.3–1.2)
Total Protein: 7.2 g/dL (ref 6.5–8.1)

## 2018-08-02 NOTE — Progress Notes (Signed)
Nutrition Assessment  Reason for Assessment:  Pt seen in Breast Clinic  ASSESSMENT:   68 year old female with new diagnosis of breast cancer.  Past medical history of DM, HTN.    Medications:  reviewed  Labs: reviewed  Anthropometrics:   Height: 64.5 inches Weight: 177 lb 12.8 oz BMI: 30   NUTRITION DIAGNOSIS: Food and nutrition related knowledge deficit related to new diagnosis of breast cancer as evidenced by no prior need for nutrition related information.  INTERVENTION:   Discussed and provided packet of information regarding nutritional tips for breast cancer patients.  Questions answered.  Teachback method used.  Contact information provided and patient knows to contact me with questions/concerns.    MONITORING, EVALUATION, and GOAL: Pt will consume a healthy plant based diet to maintain lean body mass throughout treatment.   Christina Hernandez B. Zenia Resides, [place], [place] Registered Dietitian [phone] (pager)

## 2018-08-02 NOTE — Progress Notes (Signed)
Clinical Social Work [facility] Psychosocial Distress Screening [place]  Patient completed distress screening protocol and scored a 3 on the Psychosocial Distress Thermometer which indicates mild distress. Clinical Social Worker met with patient and patients family in [facility] to assess for distress and other psychosocial needs. Patient stated she was feeling overwhelmed but felt "better" after meeting with the treatment team and getting more information on her treatment plan. CSW and patient discussed common feeling and emotions when being diagnosed with cancer, and the importance of support during treatment. CSW informed patient of the support team and support services at [facility]. CSW provided contact information and encouraged patient to call with any questions or concerns.  ONCBCN DISTRESS SCREENING 08/02/2018  Screening Type Initial Screening  Distress experienced in past week (1-10) 3  Emotional problem type Adjusting to illness  Spiritual/Religous concerns type Facing my mortality  Information Concerns Type Lack of info about treatment     Johnnye Lana, MSW, LCSW, OSW-C Clinical Social Worker [place] [phone]

## 2018-08-02 NOTE — Progress Notes (Signed)
Radiation Oncology         [phone] ________________________________  Name: Christina Hernandez        MRN: [id number]  Date of Service: 08/02/2018 DOB: July 01, 1950  LF:[id number], [name], MD  Glendale Chard, MD     REFERRING PHYSICIAN: Glendale Chard, MD   DIAGNOSIS: The encounter diagnosis was Ductal carcinoma in situ (DCIS) of right breast. Stage 0, Right Breast, LIQ, Ductal Carcinoma In Situ, ER (+), PR (+), HER2 not indicated, grade 1  HISTORY OF PRESENT ILLNESS: Christina Hernandez is a 68 y.o. female seen in the multidisciplinary breast clinic for a new diagnosis of right breast cancer. The patient was noted to have no symptoms prior to her screening mammogram. She has a family hx of her sister who was diagnosed with ovarian cancer.   She had routine screening mammography on 07/10/2018 showing a possible abnormality in the right breast. She underwent right diagnostic mammography with tomography and right breast ultrasonography on 07/21/2018 showing: Indeterminate mass more superficially located over the 3:30 position of the right breast 2 cm from the nipple measuring 3 x 4 x 4 mm. Adjacent 6 mm cyst located deeper at the 3:30 position of the right breast 2 cm from the nipple.  Accordingly on 07/24/2018 she proceeded to biopsy of the right breast area in question. The pathology from this procedure showed: Breast, right, needle core biopsy, 3:30 with ductal carcinoma in situ Prognostic indicators significant for: ER, 100% positive and PR, 100% positive, both with strong staining intensity.   PREVIOUS RADIATION THERAPY: No   PAST MEDICAL HISTORY:  Past Medical History:  Diagnosis Date  . Arthritis   . Diabetes ([place])    type 2  . GERD (gastroesophageal reflux disease)    rarely occurs- no meds  . Hypertension   . Hypothyroidism   . Shortness of breath    pt states sob on exertion because she is "out of shape"  . Trigeminal neuralgia of left side of face 2015   s/p Gamma  knife       PAST SURGICAL HISTORY: Past Surgical History:  Procedure Laterality Date  . COLONOSCOPY    . Gamma Knife Left 04/08/2016  . HYSTEROSCOPY W/D&C  05/24/2011   Procedure: DILATATION AND CURETTAGE (D&C) /HYSTEROSCOPY;  Surgeon: [name], MD;  Location: [facility];  Service: Gynecology;  Laterality: N/A;  . KNEE SURGERY Right 06/10/15   torn ligament  . THYROIDECTOMY N/A 09/08/2016   Procedure: TOTAL THYROIDECTOMY;  Surgeon: [name], MD;  Location: [facility];  Service: ENT;  Laterality: N/A;  . TUBAL LIGATION    . uterine polyp    . WISDOM TOOTH EXTRACTION Left 2014     FAMILY HISTORY:  Family History  Problem Relation Age of Onset  . Hypertension Mother   . Heart disease Mother   . Kidney failure Mother   . Heart Problems Father   . Stroke Father   . Pneumonia Father   . Heart Problems Sister   . Cardiomyopathy Sister   . Ovarian cancer Sister   . Breast cancer Neg Hx      SOCIAL HISTORY:  reports that she has never smoked. She has never used smokeless tobacco. She reports that she does not drink alcohol or use drugs.   ALLERGIES: Cyclobenzaprine   MEDICATIONS:  Current Outpatient Medications  Medication Sig Dispense Refill  . acetaminophen (TYLENOL) 500 MG tablet Take 1 tablet (500 mg total) by mouth every 8 (eight) hours as needed. 30 tablet 1  .  Biotin 5000 MCG CAPS Take 1 capsule by mouth daily.     . cetirizine (ZYRTEC) 10 MG tablet Take 1 tablet (10 mg total) by mouth daily. (Patient taking differently: Take 10 mg by mouth daily as needed for allergies. ) 30 tablet 3  . DULoxetine (CYMBALTA) 60 MG capsule Take 1 capsule (60 mg total) by mouth daily. 90 capsule 4  . gabapentin (NEURONTIN) 300 MG capsule Take 3 capsules (900 mg total) by mouth 3 (three) times daily. 810 capsule 4  . ibuprofen (ADVIL,MOTRIN) 400 MG tablet Take by mouth.    . losartan-hydrochlorothiazide (HYZAAR) 50-12.5 MG per tablet Take 1 tablet by mouth daily.      . Multiple Vitamin  (MULTIVITAMIN WITH MINERALS) TABS tablet Take 1 tablet by mouth daily.    . NONFORMULARY OR COMPOUNDED ITEM Compounded cream for right knee as needed    . pravastatin (PRAVACHOL) 40 MG tablet Take 40 mg by mouth daily.    . Saxagliptin-Metformin (KOMBIGLYZE XR) 5-500 MG TB24 Take 1 tablet by mouth daily before supper. 90 tablet 1  . SYNTHROID 75 MCG tablet TAKE 1 TABLET DAILY MON-SAT, HOLD ON SUNDAY. 26 tablet 4  . traMADol (ULTRAM) 50 MG tablet Take 1 tablet (50 mg total) by mouth every 8 (eight) hours as needed. 20 tablet 0   No current facility-administered medications for this encounter.      REVIEW OF SYSTEMS: On review of systems, the patient reports that she is doing well overall. She denies any chest pain, shortness of breath, cough, fevers, chills, night sweats, unintended weight changes. She denies any bowel or bladder disturbances, and denies abdominal pain, nausea or vomiting. She denies any new musculoskeletal or joint aches or pains. A complete review of systems is obtained and is otherwise negative.     PHYSICAL EXAM:  Wt Readings from Last 3 Encounters:  08/02/18 177 lb 12.8 oz (80.6 kg)  07/10/18 179 lb 12.8 oz (81.6 kg)  07/04/18 181 lb 12.8 oz (82.5 kg)   Temp Readings from Last 3 Encounters:  08/02/18 97.8 F (36.6 C) (Oral)  07/10/18 98.5 F (36.9 C) (Oral)  05/10/18 97.7 F (36.5 C) (Oral)   BP Readings from Last 3 Encounters:  08/02/18 138/67  07/10/18 (!) 147/79  07/04/18 130/82   Pulse Readings from Last 3 Encounters:  08/02/18 95  07/10/18 86  07/04/18 (!) 109     In general this is a well appearing African-American female in no acute distress. She is alert and oriented x4 and appropriate throughout the examination. HEENT reveals that the patient is normocephalic, atraumatic. EOMs are intact. Skin is intact without any evidence of gross lesions. Cardiovascular exam reveals a regular rate and rhythm, no clicks rubs or murmurs are auscultated. Chest is  clear to auscultation bilaterally. Lymphatic assessment is performed and does not reveal any adenopathy in the cervical, supraclavicular, axillary, or inguinal chains. Bilateral breast exam is performed and reveals right breast with bruising noted in the inferior aspect. Biopsy changes noted. Left breast with no palpable masses in left breast. No palpable massed noted in bilateral axilla. Abdomen has active bowel sounds in all quadrants and is intact. The abdomen is soft, non tender, non distended. Lower extremities are negative for pretibial pitting edema, deep calf tenderness, cyanosis or clubbing.     ECOG = 0  0 - Asymptomatic (Fully active, able to carry on all predisease activities without restriction)  1 - Symptomatic but completely ambulatory (Restricted in physically strenuous activity but ambulatory  and able to carry out work of a light or sedentary nature. For example, light housework, office work)  2 - Symptomatic, <50% in bed during the day (Ambulatory and capable of all self care but unable to carry out any work activities. Up and about more than 50% of waking hours)  3 - Symptomatic, >50% in bed, but not bedbound (Capable of only limited self-care, confined to bed or chair 50% or more of waking hours)  4 - Bedbound (Completely disabled. Cannot carry on any self-care. Totally confined to bed or chair)  5 - Death   Eustace Pen MM, Creech RH, Tormey DC, et al. [phone]). "Toxicity and response criteria of the [facility]". [place] Oncol. 5 (6): 649-55    LABORATORY DATA:  Lab Results  Component Value Date   WBC 5.2 08/02/2018   HGB 12.0 08/02/2018   HCT 37.5 08/02/2018   MCV 85.8 08/02/2018   PLT 245 08/02/2018   Lab Results  Component Value Date   NA 144 08/02/2018   K 3.4 (L) 08/02/2018   CL 108 08/02/2018   CO2 27 08/02/2018   Lab Results  Component Value Date   ALT 22 08/02/2018   AST 18 08/02/2018   ALKPHOS 97 08/02/2018   BILITOT 0.7  08/02/2018      RADIOGRAPHY: US Breast Ltd Uni Right Inc Axilla  Result Date: 07/21/2018 CLINICAL DATA:  Patient presents for additional views of the right breast as follow-up to recent screening exam to evaluate a possible mass. EXAM: DIGITAL DIAGNOSTIC right MAMMOGRAM WITH TOMO ULTRASOUND right BREAST COMPARISON:  Previous exam(s). ACR Breast Density Category c: The breast tissue is heterogeneously dense, which may obscure small masses. FINDINGS: Additional spot compression tomographic images demonstrate 2 adjacent oval mostly circumscribed subcentimeter masses over the inner lower right breast. Targeted ultrasound is performed, showing an oval cyst in the deeper position at the 3:30 position of the right breast 2 cm from the nipple measuring 3 x 6 x 6 mm corresponding to the larger deeper mass seen mammographically. Just superficial to this mass is a more indeterminate appearing hypoechoic mass with slightly irregular shape and borders measuring 3 x 4 x 4 mm. Ultrasound of the right axilla is normal. IMPRESSION: Indeterminate mass more superficially located over the 3:30 position of the right breast 2 cm from the nipple measuring 3 x 4 x 4 mm. Adjacent 6 mm cyst located deeper at the 3:30 position of the right breast 2 cm from the nipple. RECOMMENDATION: Recommend ultrasound-guided core needle biopsy of the indeterminate 4 mm mass at the 3:30 position of the right breast. I have discussed the findings and recommendations with the patient. Results were also provided in writing at the conclusion of the visit. If applicable, a reminder letter will be sent to the patient regarding the next appointment. BI-RADS CATEGORY  4: Suspicious. Biopsy will be scheduled here at the [facility] prior to patient's departure. Electronically Signed   By: Marin Olp M.D.   On: 07/21/2018 12:36   Mm Diag Breast Tomo Uni Right  Result Date: 07/21/2018 CLINICAL DATA:  Patient presents for additional views  of the right breast as follow-up to recent screening exam to evaluate a possible mass. EXAM: DIGITAL DIAGNOSTIC right MAMMOGRAM WITH TOMO ULTRASOUND right BREAST COMPARISON:  Previous exam(s). ACR Breast Density Category c: The breast tissue is heterogeneously dense, which may obscure small masses. FINDINGS: Additional spot compression tomographic images demonstrate 2 adjacent oval mostly circumscribed subcentimeter masses  over the inner lower right breast. Targeted ultrasound is performed, showing an oval cyst in the deeper position at the 3:30 position of the right breast 2 cm from the nipple measuring 3 x 6 x 6 mm corresponding to the larger deeper mass seen mammographically. Just superficial to this mass is a more indeterminate appearing hypoechoic mass with slightly irregular shape and borders measuring 3 x 4 x 4 mm. Ultrasound of the right axilla is normal. IMPRESSION: Indeterminate mass more superficially located over the 3:30 position of the right breast 2 cm from the nipple measuring 3 x 4 x 4 mm. Adjacent 6 mm cyst located deeper at the 3:30 position of the right breast 2 cm from the nipple. RECOMMENDATION: Recommend ultrasound-guided core needle biopsy of the indeterminate 4 mm mass at the 3:30 position of the right breast. I have discussed the findings and recommendations with the patient. Results were also provided in writing at the conclusion of the visit. If applicable, a reminder letter will be sent to the patient regarding the next appointment. BI-RADS CATEGORY  4: Suspicious. Biopsy will be scheduled here at the [facility] prior to patient's departure. Electronically Signed   By: Marin Olp M.D.   On: 07/21/2018 12:36   Mm 3d Screen Breast Bilateral  Result Date: 07/10/2018 CLINICAL DATA:  Screening. EXAM: DIGITAL SCREENING BILATERAL MAMMOGRAM WITH TOMO AND CAD COMPARISON:  Previous exam(s). ACR Breast Density Category c: The breast tissue is heterogeneously dense, which may  obscure small masses. FINDINGS: In the right breast, a possible mass in the INNER breast adjacent to a stable benign intramammary node warrants further evaluation. In the left breast, no findings suspicious for malignancy. Images were processed with CAD. IMPRESSION: Further evaluation is suggested for possible mass in the INNER right breast adjacent to a stable benign intramammary node. RECOMMENDATION: Diagnostic mammogram and possibly ultrasound of the right breast. (Code:[id number]) The patient will be contacted regarding the findings, and additional imaging will be scheduled. BI-RADS CATEGORY  0: Incomplete. Need additional imaging evaluation and/or prior mammograms for comparison. Electronically Signed   By: Evangeline Dakin M.D.   On: 07/10/2018 16:29   Mm Clip Placement Right  Result Date: 07/24/2018 CLINICAL DATA:  Status post ultrasound-guided core biopsy of a right breast mass. EXAM: DIAGNOSTIC RIGHT MAMMOGRAM POST ULTRASOUND BIOPSY COMPARISON:  Previous exam(s). FINDINGS: Mammographic images were obtained following ultrasound guided biopsy of a mass in the 3:30 region of the right breast. Mammographic images show there is a ribbon shaped clip in appropriate position in the medial aspect of the right breast. IMPRESSION: Status post ultrasound-guided core biopsy of the right breast with pathology pending. Final Assessment: Post Procedure Mammograms for Marker Placement Electronically Signed   By: Lillia Mountain M.D.   On: 07/24/2018 15:49   [place] Rt Breast Bx W Loc Dev 1st Lesion Img Bx Spec US Guide  Addendum Date: 07/26/2018   ADDENDUM REPORT: 07/25/2018 15:24 ADDENDUM: Pathology revealed LOW GRADE DUCTAL CARCINOMA IN SITU of the Right breast, 3:30. The carcinoma may be involving an underlying papillary lesion. This was found to be concordant by Dr. Lillia Mountain. Pathology results were discussed with the patient by telephone. The patient reported doing well after the biopsy with tenderness at the site. Post  biopsy instructions and care were reviewed and questions were answered. The patient was encouraged to call [facility] for any additional concerns. The patient was referred to [facility] on August 02, 2018. Pathology results reported by Terie Purser, RN on 07/25/2018. Electronically Signed   By: Lillia Mountain M.D.   On: 07/25/2018 15:24   Result Date: 07/26/2018 CLINICAL DATA:  Right breast mass. EXAM: ULTRASOUND GUIDED RIGHT BREAST CORE NEEDLE BIOPSY COMPARISON:  Previous exam(s). FINDINGS: I met with the patient and we discussed the procedure of ultrasound-guided biopsy, including benefits and alternatives. We discussed the high likelihood of a successful procedure. We discussed the risks of the procedure, including infection, bleeding, tissue injury, clip migration, and inadequate sampling. Informed written consent was given. The usual time-out protocol was performed immediately prior to the procedure. Lesion quadrant: Right lower inner quadrant Using sterile technique and 1% lidocaine and 1% lidocaine with epinephrine as local anesthetic, under direct ultrasound visualization, a 12 gauge spring-loaded device was used to perform biopsy of a mass in the 3:30 region of the right breast using an inferior to superior approach. At the conclusion of the procedure a ribbon shaped tissue marker clip was deployed into the biopsy cavity. Follow up 2 view mammogram was performed and dictated separately. IMPRESSION: Ultrasound guided biopsy of the right breast. No apparent complications. Electronically Signed: By: Lillia Mountain M.D. On: 07/24/2018 15:42       IMPRESSION/PLAN: 1. Stage 0, Right Breast, LIQ, Ductal Carcinoma In Situ, ER (+), PR (+), HER2 not indicated, grade 1  Patient will have genetic testing completed prior right lumpectomy with Dr. Lucia Gaskins. Discussed that radiation is a standard recommendation for  this circumstance, discussed the risks if radiation isn't completed as well. Patient will have radiation completed 4 weeks s/p lumpectomy with an appointment prior to simulate the patient. She will receive 4 weeks of radiation to the whole breast to prevent locoregional recurrence.  Patient advised that the risks are but not limited to, skin irritation and mild fatigue towards the end of radiation treatments. Discussed with the patient that any and all side effects from radiation will be monitored and that the patient will be given a cream during radiation treatment to help with skin irritation. Advised that the skin irritation will occur 2-3 weeks into radiation treatment and that we will monitor it as needed. She will be started on hormonal therapy following completion of radiation therapy with Dr. Jana Hakim. Advised use of annual mammograms after completion of treatment and stressed the importance of this.   Patient will follow up in the office shortly after her surgery to be scheduled for CT simulation with treatments starting shortly afterwards. She knows to call with any questions or concerns.     ------------------------------------------------  Jodelle Gross, MD, PhD   This document serves as a record of services personally performed by [name], MD. It was created on his behalf by Steva Colder, a trained medical scribe. The creation of this record is based on the scribe's personal observations and the provider's statements to them. This document has been checked and approved by the attending provider.

## 2018-08-07 ENCOUNTER — Other Ambulatory Visit: Payer: Self-pay | Admitting: Surgery

## 2018-08-07 DIAGNOSIS — D0591 Unspecified type of carcinoma in situ of right breast: Secondary | ICD-10-CM

## 2018-08-08 ENCOUNTER — Encounter: Payer: Self-pay | Admitting: *Deleted

## 2018-08-08 DIAGNOSIS — D0511 Intraductal carcinoma in situ of right breast: Secondary | ICD-10-CM

## 2018-08-10 ENCOUNTER — Encounter: Payer: Self-pay | Admitting: Radiation Oncology

## 2018-08-10 ENCOUNTER — Encounter: Payer: Self-pay | Admitting: Internal Medicine

## 2018-08-10 ENCOUNTER — Telehealth: Payer: Self-pay | Admitting: *Deleted

## 2018-08-10 ENCOUNTER — Ambulatory Visit: Payer: Medicare Other | Admitting: Internal Medicine

## 2018-08-10 ENCOUNTER — Other Ambulatory Visit: Payer: Self-pay

## 2018-08-10 VITALS — BP 118/62 | HR 86 | Temp 97.5°F | Ht 64.0 in | Wt 176.4 lb

## 2018-08-10 DIAGNOSIS — I10 Essential (primary) hypertension: Secondary | ICD-10-CM | POA: Diagnosis not present

## 2018-08-10 DIAGNOSIS — C50011 Malignant neoplasm of nipple and areola, right female breast: Secondary | ICD-10-CM | POA: Diagnosis not present

## 2018-08-10 DIAGNOSIS — R9431 Abnormal electrocardiogram [ECG] [EKG]: Secondary | ICD-10-CM | POA: Diagnosis not present

## 2018-08-10 DIAGNOSIS — E1165 Type 2 diabetes mellitus with hyperglycemia: Secondary | ICD-10-CM | POA: Diagnosis not present

## 2018-08-10 DIAGNOSIS — R202 Paresthesia of skin: Secondary | ICD-10-CM

## 2018-08-10 NOTE — Progress Notes (Signed)
Subjective:     Patient ID: Christina Hernandez , female    DOB: [birth year] , 68 y.o.   MRN: [id number]   Chief Complaint  Patient presents with  . Hypertension  . Diabetes    HPI Pt is here for DM and HTN FU. She has not been checking her glucose and BP's. Been dealing with new breast cancer diagnosis.    Past Medical History:  Diagnosis Date  . Arthritis   . Diabetes ([place])    type 2  . GERD (gastroesophageal reflux disease)    rarely occurs- no meds  . Hypertension   . Hypothyroidism   . Shortness of breath    pt states sob on exertion because she is "out of shape"  . Trigeminal neuralgia of left side of face 2015   s/p Gamma knife     Family History  Problem Relation Age of Onset  . Hypertension Mother   . Heart disease Mother   . Kidney failure Mother   . Heart Problems Father   . Stroke Father   . Pneumonia Father   . Heart Problems Sister   . Cardiomyopathy Sister   . Ovarian cancer Sister   . Breast cancer Neg Hx      Current Outpatient Medications:  .  acetaminophen (TYLENOL) 500 MG tablet, Take 1 tablet (500 mg total) by mouth every 8 (eight) hours as needed., Disp: 30 tablet, Rfl: 1 .  Biotin 5000 MCG CAPS, Take 1 capsule by mouth daily. , Disp: , Rfl:  .  cetirizine (ZYRTEC) 10 MG tablet, Take 1 tablet (10 mg total) by mouth daily. (Patient taking differently: Take 10 mg by mouth daily as needed for allergies. ), Disp: 30 tablet, Rfl: 3 .  DULoxetine (CYMBALTA) 60 MG capsule, Take 1 capsule (60 mg total) by mouth daily., Disp: 90 capsule, Rfl: 4 .  gabapentin (NEURONTIN) 300 MG capsule, Take 3 capsules (900 mg total) by mouth 3 (three) times daily., Disp: 810 capsule, Rfl: 4 .  ibuprofen (ADVIL,MOTRIN) 400 MG tablet, Take by mouth., Disp: , Rfl:  .  losartan-hydrochlorothiazide (HYZAAR) 50-12.5 MG per tablet, Take 1 tablet by mouth daily.  , Disp: , Rfl:  .  Multiple Vitamin (MULTIVITAMIN WITH MINERALS) TABS tablet, Take 1 tablet by mouth daily.,  Disp: , Rfl:  .  pravastatin (PRAVACHOL) 40 MG tablet, Take 40 mg by mouth daily., Disp: , Rfl:  .  Saxagliptin-Metformin (KOMBIGLYZE XR) 5-500 MG TB24, Take 1 tablet by mouth daily before supper., Disp: 90 tablet, Rfl: 1 .  SYNTHROID 75 MCG tablet, TAKE 1 TABLET DAILY MON-SAT, HOLD ON SUNDAY., Disp: 26 tablet, Rfl: 4 .  traMADol (ULTRAM) 50 MG tablet, Take 1 tablet (50 mg total) by mouth every 8 (eight) hours as needed., Disp: 20 tablet, Rfl: 0 .  NONFORMULARY OR COMPOUNDED ITEM, Compounded cream for right knee as needed, Disp: , Rfl:    Allergies  Allergen Reactions  . Cyclobenzaprine Nausea Only    Review of Systems  Constitutional: Negative for appetite change, chills, diaphoresis, fatigue and fever.  Respiratory: Negative for cough, chest tightness and shortness of breath.   Cardiovascular: Negative for chest pain, palpitations and leg swelling.  Gastrointestinal: Negative for abdominal pain, constipation, diarrhea and vomiting.  Genitourinary: Negative for dysuria, frequency and urgency.  Musculoskeletal: Negative for gait problem.  Skin: Negative for rash and wound.  Neurological: Negative for dizziness, numbness and headaches.       Gets intermittent L shoulder and upper arm tingling  for several months, but went to bed last night with it and is not gone.  Today has burning face from her trigeminal neuralgia and her neurologist has sent her to Pain specialist but has not seen them yet.      Today's Vitals   08/10/18 1054  BP: 118/62  Pulse: 86  Temp: (!) 97.5 F (36.4 C)  TempSrc: Oral  SpO2: 98%  Weight: 176 lb 6.4 oz (80 kg)  Height: 5\' 4"  (1.626 m)   Body mass index is 30.28 kg/m.   Objective:  Physical Exam   Constitutional: She is oriented to person, place, and time. She appears well-developed and well-nourished. No distress.  HENT:  Head: Normocephalic and atraumatic.  Right Ear: External ear normal.  Left Ear: External ear normal.  Nose: Nose normal.   Eyes: Conjunctivae are normal. Right eye exhibits no discharge. Left eye exhibits no discharge. No scleral icterus.  Neck: Neck supple. No thyromegaly present.  No carotid bruits bilaterally  Cardiovascular: Normal rate and regular rhythm.  No murmur heard. Pulmonary/Chest: Effort normal and breath sounds normal. No respiratory distress.  Musculoskeletal: Normal range of motion. She exhibits no edema.  Lymphadenopathy:    She has no cervical adenopathy.  Neurological: She is alert and oriented to person, place, and time.  Skin: Skin is warm and dry. Capillary refill takes less than 2 seconds. No rash noted. She is not diaphoretic.  Psychiatric: She has a normal mood and affect. Her behavior is normal. Judgment and thought content normal.  Nursing note reviewed. EKG- shows change from last year with non specific T-wave abnormality. Assessment And Plan:    1. Uncontrolled type 2 diabetes mellitus with hyperglycemia ([place])- UNKNOWN  Status since she does not check her glucose.  - Hemoglobin A1c  2. Essential hypertension- stable. May continue same meds.  - CMP14 + Anion Gap - CBC no Diff 3- paresthesia- L axilla and L shoulder unknown cause. Could be from cervical spine. Pt explained that if cardiac work up is negative, we need to do work up of cervical spine.  4- abnormal EKG- showing non specific T wave abnormalities which were not present on prior EKG. Dr Baird Cancer reviewed the EKG and was compared to prior one, and advised for pt to see Dr Gaetano Hawthorne.   5- Carcinoma insitu R breast - recent diagnosis. Will continue care with oncology and plans on having surgery in March this year.    Arionna Hoggard RODRIGUEZ-SOUTHWORTH, PA-C

## 2018-08-10 NOTE — Telephone Encounter (Signed)
  Oncology Nurse Navigator Documentation  Navigator Location: [facility] (08/10/18 1300)   )Navigator Encounter Type: Telephone;[facility] Follow-up (08/10/18 1300) Telephone: Outgoing Call;Clinic/[facility] Follow-up (08/10/18 1300)     Surgery Date: 09/01/18 (08/10/18 1300)           Treatment Initiated Date: 09/01/18 (08/10/18 1300)                                Time Spent with Patient: 15 (08/10/18 1300)

## 2018-08-11 LAB — CMP14 + ANION GAP
ALT: 23 IU/L (ref 0–32)
AST: 23 IU/L (ref 0–40)
Albumin/Globulin Ratio: 1.6 (ref 1.2–2.2)
Albumin: 4.1 g/dL (ref 3.8–4.8)
Alkaline Phosphatase: 102 IU/L (ref 39–117)
Anion Gap: 16 mmol/L (ref 10.0–18.0)
BUN/Creatinine Ratio: 15 (ref 12–28)
BUN: 10 mg/dL (ref 8–27)
Bilirubin Total: 0.5 mg/dL (ref 0.0–1.2)
CO2: 23 mmol/L (ref 20–29)
Calcium: 9 mg/dL (ref 8.7–10.3)
Chloride: 102 mmol/L (ref 96–106)
Creatinine, Ser: 0.66 mg/dL (ref 0.57–1.00)
GFR calc Af Amer: 104 mL/min/{1.73_m2} (ref >59)
GFR calc non Af Amer: 90 mL/min/{1.73_m2} (ref >59)
Globulin, Total: 2.5 g/dL (ref 1.5–4.5)
Glucose: 78 mg/dL (ref 65–99)
Potassium: 3.7 mmol/L (ref 3.5–5.2)
Sodium: 141 mmol/L (ref 134–144)
Total Protein: 6.6 g/dL (ref 6.0–8.5)

## 2018-08-11 LAB — CBC
Hematocrit: 34.9 % (ref 34.0–46.6)
Hemoglobin: 11.6 g/dL (ref 11.1–15.9)
MCH: 27 pg (ref 26.6–33.0)
MCHC: 33.2 g/dL (ref 31.5–35.7)
MCV: 81 fL (ref 79–97)
Platelets: 242 10*3/uL (ref 150–450)
RBC: 4.3 x10E6/uL (ref 3.77–5.28)
RDW: 13 % (ref 11.7–15.4)
WBC: 4.8 10*3/uL (ref 3.4–10.8)

## 2018-08-11 LAB — HEMOGLOBIN A1C
Est. average glucose Bld gHb Est-mCnc: 128 mg/dL
Hgb A1c MFr Bld: 6.1 % — ABNORMAL HIGH (ref 4.8–5.6)

## 2018-08-16 ENCOUNTER — Ambulatory Visit: Payer: Medicare Other

## 2018-08-16 ENCOUNTER — Ambulatory Visit: Payer: Medicare Other | Admitting: Radiation Oncology

## 2018-08-21 ENCOUNTER — Encounter: Payer: Self-pay | Admitting: Genetic Counselor

## 2018-08-21 ENCOUNTER — Inpatient Hospital Stay: Payer: Medicare Other

## 2018-08-21 ENCOUNTER — Inpatient Hospital Stay (HOSPITAL_BASED_OUTPATIENT_CLINIC_OR_DEPARTMENT_OTHER): Payer: Medicare Other | Admitting: Genetic Counselor

## 2018-08-21 DIAGNOSIS — Z808 Family history of malignant neoplasm of other organs or systems: Secondary | ICD-10-CM

## 2018-08-21 DIAGNOSIS — D0511 Intraductal carcinoma in situ of right breast: Secondary | ICD-10-CM | POA: Diagnosis not present

## 2018-08-21 DIAGNOSIS — Z8041 Family history of malignant neoplasm of ovary: Secondary | ICD-10-CM

## 2018-08-21 NOTE — Progress Notes (Signed)
REFERRING PROVIDER: Chauncey Cruel, MD [address] [place], [place] [address]  PRIMARY PROVIDER:  Glendale Chard, MD  PRIMARY REASON FOR VISIT:  1. Ductal carcinoma in situ (DCIS) of right breast   2. Family history of ovarian cancer   3. Family history of thyroid cancer      HISTORY OF PRESENT ILLNESS:   Ms. Norsworthy, a 68 y.o. female, was seen for a [facility] cancer genetics consultation at the request of Dr. Jana Hakim due to a personal and family history of cancer.  Ms. Estill presents to clinic today to discuss the possibility of a hereditary predisposition to cancer, genetic testing, and to further clarify her future cancer risks, as well as potential cancer risks for family members.   In January 2020, at the age of 89, Ms. Terada was diagnosed with DCIS of the right breast. This will be treated with lumpectomy.  The tumor is ER/PR +.     CANCER HISTORY:   No history exists.     HORMONAL RISK FACTORS:  Menarche was at age 65.  First live birth at age 54.  OCP use for approximately 2 years.  Ovaries intact: no.  Hysterectomy: yes.  Menopausal status: postmenopausal.  HRT use: 0 years. Colonoscopy: yes; 5 polyps. Mammogram within the last year: yes. Number of breast biopsies: 1. Up to date with pelvic exams:  yes. Any excessive radiation exposure in the past:  no  Past Medical History:  Diagnosis Date  . Arthritis   . Diabetes ([place])    type 2  . Family history of ovarian cancer   . Family history of thyroid cancer   . GERD (gastroesophageal reflux disease)    rarely occurs- no meds  . Hypertension   . Hypothyroidism   . Shortness of breath    pt states sob on exertion because she is "out of shape"  . Trigeminal neuralgia of left side of face 2015   s/p Gamma knife    Past Surgical History:  Procedure Laterality Date  . COLONOSCOPY    . Gamma Knife Left 04/08/2016  . HYSTEROSCOPY W/D&C  05/24/2011   Procedure: DILATATION AND CURETTAGE  (D&C) /HYSTEROSCOPY;  Surgeon: [name], MD;  Location: [facility];  Service: Gynecology;  Laterality: N/A;  . KNEE SURGERY Right 06/10/15   torn ligament  . THYROIDECTOMY N/A 09/08/2016   Procedure: TOTAL THYROIDECTOMY;  Surgeon: [name], MD;  Location: [facility];  Service: ENT;  Laterality: N/A;  . TUBAL LIGATION    . uterine polyp    . WISDOM TOOTH EXTRACTION Left 2014    Social History   Socioeconomic History  . Marital status: Married    Spouse name: [name]  . Number of children: 4  . Years of education: BS  . Highest education level: Not on file  Occupational History  . Occupation: Retired    Fish farm manager: OTHER  Social Needs  . Financial resource strain: Not hard at all  . Food insecurity:    Worry: Never true    Inability: Never true  . Transportation needs:    Medical: No    Non-medical: No  Tobacco Use  . Smoking status: Never Smoker  . Smokeless tobacco: Never Used  Substance and Sexual Activity  . Alcohol use: No  . Drug use: No  . Sexual activity: Not Currently  Lifestyle  . Physical activity:    Days per week: 0 days    Minutes per session: 0 min  . Stress: Only a little  Relationships  . Social connections:    Talks on phone: Not on file    Gets together: Not on file    Attends religious service: Not on file    Active member of club or organization: Not on file    Attends meetings of clubs or organizations: Not on file    Relationship status: Not on file  Other Topics Concern  . Not on file  Social History Narrative   Patient lives at home with family.   Caffeine Use: occasionally     FAMILY HISTORY:  We obtained a detailed, 4-generation family history.  Significant diagnoses are listed below: Family History  Problem Relation Age of Onset  . Hypertension Mother   . Heart disease Mother   . Kidney failure Mother   . Heart Problems Father   . Stroke Father   . Pneumonia Father   . Heart Problems Sister 40       heart transplant  . Cardiomyopathy  Sister   . Ovarian cancer Sister 76       d. 37  . Thyroid disease Sister   . Thyroid disease Sister   . Thyroid cancer Niece 84  . Breast cancer Neg Hx     The patient has four children, three boys and one girl, who are all cancer free.  She has five sisters.  Two sisters died young from heart disease, one at 9 from cardiomyopathy and the other after a heart transplant at age 9.  One sister had ovarian cancer at 17 and died at 19, and the remaining two sisters have had thyroid disease and had their thyroids out.  One of these sisters has a daughterwith thyroid cancer at 40.  Both parents are deceased.    The patient's mother died in her sleep at 48.  SE had four sisters and two brothers, none reportedly had cancer.  The maternal grandparents are deceased from on cancer related issues.  The patient's father died of pneumonia.  He had two sister and four brothers.  Two brothers are living in their 64's.  None reportedly had cancer.  The paternal grandparents are deceased from stroke and dementia.   Ms. Klunder is unaware of previous family history of genetic testing for hereditary cancer risks. Patient's maternal ancestors are of Caucasian, Native [place] American descent, and paternal ancestors are of African American descent. There is no reported Ashkenazi Jewish ancestry. There is no known consanguinity.  GENETIC COUNSELING ASSESSMENT: Niaya Ailee Pates is a 68 y.o. female with a personal and family history of cancer which is somewhat suggestive of a hereditary cancer syndrome and predisposition to cancer. We, therefore, discussed and recommended the following at today's visit.   DISCUSSION: We discussed that about 5-10% of breast cancer is hereditary with most cases due to BRCA mutations. There are other genes that can increase the risk for breast and/or ovarian cancer.  Based on her family history she meets criteria for genetic testing, although there are a lot of women in  the family who are unaffected and therefore the likelihood that we will find something is relatively low.  We reviewed the characteristics, features and inheritance patterns of hereditary cancer syndromes. We also discussed genetic testing, including the appropriate family members to test, the process of testing, insurance coverage and turn-around-time for results. We discussed the implications of a negative, positive and/or variant of uncertain significant result. In order to get genetic test results in a timely manner so that Ms. Choyce can  use these genetic test results for surgical decisions, we recommended Ms. Thome pursue genetic testing for the 9-gene STAT panel. If this test is negative, we then recommend Ms. Rutledge pursue reflex genetic testing to the common hereditary gene panel. The Hereditary Gene Panel offered by [facility] includes sequencing and/or deletion duplication testing of the following 47 genes: APC, ATM, AXIN2, BARD1, BMPR1A, BRCA1, BRCA2, BRIP1, CDH1, CDK4, CDKN2A (p14ARF), CDKN2A (p16INK4a), CHEK2, CTNNA1, DICER1, EPCAM (Deletion/duplication testing only), GREM1 (promoter region deletion/duplication testing only), KIT, MEN1, MLH1, MSH2, MSH3, MSH6, MUTYH, NBN, NF1, NHTL1, PALB2, PDGFRA, PMS2, POLD1, POLE, PTEN, RAD50, RAD51C, RAD51D, SDHB, SDHC, SDHD, SMAD4, SMARCA4. STK11, TP53, TSC1, TSC2, and VHL.  The following genes were evaluated for sequence changes only: SDHA and HOXB13 c.251G>A variant only.   Based on Ms. Chisom's personal and family history of cancer, she meets medical criteria for genetic testing. Despite that she meets criteria, she may still have an out of pocket cost. We discussed that if her out of pocket cost for testing is over $100, the laboratory will call and confirm whether she wants to proceed with testing.  If the out of pocket cost of testing is less than $100 she will be billed by the genetic testing laboratory.   PLAN: After considering the risks,  benefits, and limitations, Ms. Lienau  provided informed consent to pursue genetic testing and the blood sample was sent to [facility] for analysis of the common hereditary cancer panel. Results should be available within approximately 5-12 days for the STAT panel and up to an additional 2-3 weeks' time for the remainder of the panel, at which point they will be disclosed by telephone to Ms. Kadel, as will any additional recommendations warranted by these results. Ms. Pangilinan will receive a summary of her genetic counseling visit and a copy of her results once available. This information will also be available in Epic. We encouraged Ms. Niess to remain in contact with cancer genetics annually so that we can continuously update the family history and inform her of any changes in cancer genetics and testing that may be of benefit for her family. Ms. Trieu questions were answered to her satisfaction today. Our contact information was provided should additional questions or concerns arise.  Lastly, we encouraged Ms. Morrisette to remain in contact with cancer genetics annually so that we can continuously update the family history and inform her of any changes in cancer genetics and testing that may be of benefit for this family.   Ms.  Vanleeuwen questions were answered to her satisfaction today. Our contact information was provided should additional questions or concerns arise. Thank you for the referral and allowing [place] to share in the care of your patient.   Karen P. Florene Glen, [place], [facility] Certified Genetic Counselor [email or web address] phone: [phone]  The patient was seen for a total of 52 minutes in face-to-face genetic counseling.  This patient was discussed with Drs. Magrinat, Lindi Adie and/or Burr Medico who agrees with the above.    _______________________________________________________________________ For Office Staff:  Number of people involved in session: 2 Was an Intern/ student  involved with case: yes; Vanita Panda

## 2018-08-24 ENCOUNTER — Ambulatory Visit: Payer: Medicare Other | Admitting: Cardiology

## 2018-08-24 ENCOUNTER — Encounter: Payer: Self-pay | Admitting: Cardiology

## 2018-08-24 VITALS — BP 126/64 | HR 89 | Ht 64.0 in | Wt 177.4 lb

## 2018-08-24 DIAGNOSIS — I10 Essential (primary) hypertension: Secondary | ICD-10-CM | POA: Diagnosis not present

## 2018-08-24 DIAGNOSIS — D0511 Intraductal carcinoma in situ of right breast: Secondary | ICD-10-CM

## 2018-08-24 DIAGNOSIS — Z01810 Encounter for preprocedural cardiovascular examination: Secondary | ICD-10-CM

## 2018-08-24 DIAGNOSIS — E7800 Pure hypercholesterolemia, unspecified: Secondary | ICD-10-CM | POA: Diagnosis not present

## 2018-08-24 DIAGNOSIS — R9431 Abnormal electrocardiogram [ECG] [EKG]: Secondary | ICD-10-CM | POA: Diagnosis not present

## 2018-08-24 DIAGNOSIS — E1165 Type 2 diabetes mellitus with hyperglycemia: Secondary | ICD-10-CM

## 2018-08-24 NOTE — Assessment & Plan Note (Signed)
At this point, she is not actively having any anginal type chest pain with pressure exertion.  She is not having any heart failure symptoms of exertional dyspnea, PND or orthopnea.  Her exercise is more limited by knee pain.  She has history of trigeminal neuralgia not stroke.  She has diabetes on oral medications.  She has normal renal function.  Her left shoulder pain does not sound cardiac in nature to me.  Her EKG is non-specific and not overly concerning. In light of this, I do not think that there is any reason to do any preoperative cardiovascular evaluation with any type of stress test or echocardiogram in the absence of symptoms.  She would simply be a low risk patient for low risk surgery.

## 2018-08-24 NOTE — Assessment & Plan Note (Signed)
Blood pressure was well-controlled today. Appropriately on ARB with diabetes.

## 2018-08-24 NOTE — Assessment & Plan Note (Addendum)
She is scheduled to have lumpectomy next week.  At this point, I do not think there is any need for any cardiac evaluation with stress test or echocardiogram prior to her having her operation.  She would be a low risk patient.

## 2018-08-24 NOTE — Assessment & Plan Note (Signed)
Essentially, the findings that I see on EKG are nonspecific and would not be suggestive of active coronary disease.  I would not recommend any urgent evaluation at this point.  She does have risk factors of diabetes, hypertension, hyperlipidemia and family members with cardiac disease, therefore I think is not unreasonable to evaluate for baseline risk, but this can be delayed as this is only elective evaluation.

## 2018-08-24 NOTE — Progress Notes (Signed)
PCP: Glendale Chard, MD  Clinic Note: Chief Complaint  Patient presents with  . New Patient (Initial Visit)    Abnormal EKG, left shoulder pain    HPI: Christina Hernandez is a 68 y.o. female with cardiac risk factors of hypertension, hyperlipidemia and diabetes mellitus, type II who was recently diagnosed with right-sided breast cancer pending surgery.  She is being seen today for the evaluation of ABNORMAL EKG WITH NONSPECIFIC CHANGES AND LEFT SHOULDER PAIN at the request of Rodriguez-Southworth, S*.  Christina Hernandez was last seen on February 13 by Shelby Mattocks, PA-C for routine follow-up for her glucose levels.  As part of her evaluation she was evaluated an EKG that unfortunately is not yet scanned.  Apparently this had some nonspecific changes.  She also noted in this visit of having left-sided shoulder pain.  As a result she was referred to cardiology for evaluation.   Recent Hospitalizations: None  Studies Personally Reviewed - (if available, images/films reviewed: From Epic Chart or Care Everywhere) No previous studies   Interval History: Christina Hernandez presents here for cardiology evaluation stating that she really is feeling okay.  She has these intermittent episodes of left-sided pain that radiates from the mid axillary line up around the shoulder down her left arm.  This pain happens at rest, with exertion, and is not exacerbated by exertion.  She had a pretty significant episode of it while she was being seen by her PCP, but has not noted a symptom yet today.  Sometimes with the symptoms she will also get some nausea.  She says these episodes been going on off and on for the last several months.  No chest pain or shortness of breath with rest or exertion. No PND, orthopnea or edema. No palpitations, lightheadedness, dizziness, weakness or syncope/near syncope. No TIA/amaurosis fugax symptoms. No melena, hematochezia, hematuria, or epstaxis.  PAD Screen  08/24/2018  Previous PAD dx? No  Previous surgical procedure? No  Pain with walking? Yes  Subsides with rest? (No Data)  Feet/toe relief with dangling? No  Painful, non-healing ulcers? No  Extremities discolored? No   She has an Epworth score of 13 (scanned in).  Apparently, she had had a sleep study ordered in the past.  ROS: A comprehensive was performed. Review of Systems  Constitutional: Negative for chills, fever, malaise/fatigue and weight loss.  HENT: Negative for nosebleeds.   Eyes:       Glaucoma  Respiratory: Negative for shortness of breath.   Cardiovascular: Negative for chest pain, claudication and leg swelling.  Gastrointestinal: Positive for abdominal pain (Occasionally notes pain in the right side of stomach.) and nausea. Negative for blood in stool, heartburn and melena.  Genitourinary: Negative for dysuria and hematuria.  Musculoskeletal: Positive for joint pain (Bilateral knee pain from osteoarthritis, this limits her ability to walk beyond a few blocks.).  Neurological: Negative for dizziness and headaches.  Psychiatric/Behavioral: Negative.    I have reviewed and (if needed) personally updated the patient's problem list, medications, allergies, past medical and surgical history, social and family history.   Past Medical History:  Diagnosis Date  . Arthritis   . Breast cancer, right breast [facility])    pending right breast lumpectomy on September 01, 2018  . Diabetes mellitus type II, controlled ([place])    On Kombiglyze  . Family history of ovarian cancer   . Family history of thyroid cancer   . GERD (gastroesophageal reflux disease)    rarely occurs- no meds  . Hyperlipidemia  associated with type 2 diabetes mellitus ([place])   . Hypertension   . Hypothyroidism   . Trigeminal neuralgia of left side of face 2015   s/p Gamma knife    Past Surgical History:  Procedure Laterality Date  . COLONOSCOPY    . Gamma Knife Left 04/08/2016  . HYSTEROSCOPY W/D&C  05/24/2011     Procedure: DILATATION AND CURETTAGE (D&C) /HYSTEROSCOPY;  Surgeon: [name], MD;  Location: [facility];  Service: Gynecology;  Laterality: N/A;  . KNEE SURGERY Right 06/10/15   torn ligament  . THYROIDECTOMY N/A 09/08/2016   Procedure: TOTAL THYROIDECTOMY;  Surgeon: [name], MD;  Location: [facility];  Service: ENT;  Laterality: N/A;  . TUBAL LIGATION    . uterine polyp    . WISDOM TOOTH EXTRACTION Left 2014    Current Meds  Medication Sig  . acetaminophen (TYLENOL) 500 MG tablet Take 1 tablet (500 mg total) by mouth every 8 (eight) hours as needed.  . Biotin 5000 MCG CAPS Take 1 capsule by mouth daily.   . cetirizine (ZYRTEC) 10 MG tablet Take 1 tablet (10 mg total) by mouth daily. (Patient taking differently: Take 10 mg by mouth daily as needed for allergies. )  . DULoxetine (CYMBALTA) 60 MG capsule Take 1 capsule (60 mg total) by mouth daily.  Marland Kitchen gabapentin (NEURONTIN) 300 MG capsule Take 3 capsules (900 mg total) by mouth 3 (three) times daily.  Marland Kitchen ibuprofen (ADVIL,MOTRIN) 400 MG tablet Take by mouth.  . losartan-hydrochlorothiazide (HYZAAR) 50-12.5 MG per tablet Take 1 tablet by mouth daily.    . Multiple Vitamin (MULTIVITAMIN WITH MINERALS) TABS tablet Take 1 tablet by mouth daily.  . NONFORMULARY OR COMPOUNDED ITEM Compounded cream for right knee as needed  . pravastatin (PRAVACHOL) 40 MG tablet Take 40 mg by mouth daily.  . Saxagliptin-Metformin (KOMBIGLYZE XR) 5-500 MG TB24 Take 1 tablet by mouth daily before supper.  Marland Kitchen SYNTHROID 75 MCG tablet TAKE 1 TABLET DAILY MON-SAT, HOLD ON SUNDAY.  . traMADol (ULTRAM) 50 MG tablet Take 1 tablet (50 mg total) by mouth every 8 (eight) hours as needed.    Allergies  Allergen Reactions  . Cyclobenzaprine Nausea Only    Social History   Tobacco Use  . Smoking status: Never Smoker  . Smokeless tobacco: Never Used  Substance Use Topics  . Alcohol use: No  . Drug use: No   Social History   Social History Narrative   Patient lives at  home with family.   Caffeine Use: occasionally      Lives at home with husband and son.  Is a retired Pharmacist, hospital    family history includes Cardiomyopathy in her sister; Congestive Heart Failure in her mother; Dementia in her paternal grandmother; Heart Problems in her father; Heart Problems (age of onset: 51) in her sister; Hypertension in her mother; Kidney failure in her mother; Ovarian cancer (age of onset: 43) in her sister; Pneumonia in her father; Stroke in her father, paternal grandfather, and paternal grandmother; Thyroid cancer (age of onset: 85) in her niece; Thyroid disease in her sister and sister.  Wt Readings from Last 3 Encounters:  08/24/18 177 lb 6.4 oz (80.5 kg)  08/10/18 176 lb 6.4 oz (80 kg)  08/02/18 177 lb 12.8 oz (80.6 kg)    PHYSICAL EXAM BP 126/64 (BP Location: Right Arm)   Pulse 89   Ht 5\' 4"  (1.626 m)   Wt 177 lb 6.4 oz (80.5 kg)   BMI 30.45 kg/m  Physical  Exam  Constitutional: She is oriented to person, place, and time. She appears well-developed and well-nourished. No distress.  A little slow to respond, she was taking a nap when I walked in the room.  HENT:  Head: Normocephalic and atraumatic.  Eyes: Conjunctivae and EOM are normal.  Neck: Normal range of motion. Neck supple. No JVD present.  Cardiovascular: Normal rate, regular rhythm, normal heart sounds and intact distal pulses.  No extrasystoles are present. PMI is not displaced. Exam reveals no gallop and no friction rub.  No murmur heard. Pulmonary/Chest: Effort normal and breath sounds normal. No respiratory distress. She has no wheezes. She has no rales.  Abdominal: Soft. Bowel sounds are normal. She exhibits no distension. There is no abdominal tenderness. There is no rebound.  Musculoskeletal: Normal range of motion.        General: Edema (Trivial bilateral ankle) present.  Neurological: She is alert and oriented to person, place, and time. She has normal reflexes. No cranial nerve deficit.    Skin: Skin is warm and dry.  Psychiatric: She has a normal mood and affect. Her behavior is normal. Judgment and thought content normal.  Vitals reviewed.    Adult ECG Report  Rate: 89 ;  Rhythm: normal sinus rhythm and Normal axis, intervals and durations.  Nonspecific ST of T wave changes.;   Narrative Interpretation: EKG is very similar to that of 2019.  Most recent EKG not available to review.   Other studies Reviewed: Additional studies/ records that were reviewed today include:  Recent Labs:   Lab Results  Component Value Date   CHOL 183 05/10/2018   HDL 53 05/10/2018   LDLCALC 103 (H) 05/10/2018   TRIG 134 05/10/2018   CHOLHDL 3.5 05/10/2018   Lab Results  Component Value Date   CREATININE 0.66 08/10/2018   BUN 10 08/10/2018   NA 141 08/10/2018   K 3.7 08/10/2018   CL 102 08/10/2018   CO2 23 08/10/2018    ASSESSMENT / PLAN:  She was referred for abnormal EKG and left shoulder pain.  Left shoulder pain is not anginal in nature.  More likely musculoskeletal than anything else.  Would not recommend a stress test at this time as this would simply delay her surgery that she needs to have next week.  Problem List Items Addressed This Visit    Ductal carcinoma in situ (DCIS) of right breast    She is scheduled to have lumpectomy next week.  At this point, I do not think there is any need for any cardiac evaluation with stress test or echocardiogram prior to her having her operation.  She would be a low risk patient.      Relevant Orders   EKG 12-Lead   Essential hypertension (Chronic)    Blood pressure was well-controlled today. Appropriately on ARB with diabetes.      Relevant Orders   EKG 12-Lead   Nonspecific abnormal electrocardiogram (ECG) (EKG) - Primary    Essentially, the findings that I see on EKG are nonspecific and would not be suggestive of active coronary disease.  I would not recommend any urgent evaluation at this point.  She does have risk  factors of diabetes, hypertension, hyperlipidemia and family members with cardiac disease, therefore I think is not unreasonable to evaluate for baseline risk, but this can be delayed as this is only elective evaluation.      Pre-operative cardiovascular examination     At this point, she is not actively having any  anginal type chest pain with pressure exertion.  She is not having any heart failure symptoms of exertional dyspnea, PND or orthopnea.  Her exercise is more limited by knee pain.  She has history of trigeminal neuralgia not stroke.  She has diabetes on oral medications.  She has normal renal function.  Her left shoulder pain does not sound cardiac in nature to me.  Her EKG is non-specific and not overly concerning. In light of this, I do not think that there is any reason to do any preoperative cardiovascular evaluation with any type of stress test or echocardiogram in the absence of symptoms.  She would simply be a low risk patient for low risk surgery.       Relevant Orders   EKG 12-Lead   Pure hypercholesterolemia (Chronic)    Is on stable dose of pravastatin.  Follow-up PCP.  LDL is 103.  Target with her risk factors would be at least 100 or less.      Uncontrolled type 2 diabetes mellitus with hyperglycemia ([place])     I plan to see her back in a few months after her surgery, because she would like to discuss just the baseline evaluation of her cardiovascular risk going forward.  She does have cardiovascular risk factors and would like to talk about doing some type of screening testing.  I do not think that this should be done prior to her operation, because she is asymptomatic.  I spent a total of 30 minutes with the patient and chart review. >  50% of the time was spent in direct patient consultation.   Current medicines are reviewed at length with the patient today.  (+/- concerns) none The following changes have been made:  None  Patient Instructions  Medication  Instructions:  NO CHANGES  If you need a refill on your cardiac medications before your next appointment, please call your pharmacy.   Lab work: NOT NEEDED If you have labs (blood work) drawn today and your tests are completely normal, you will receive your results only by: Marland Kitchen MyChart Message (if you have MyChart) OR . A paper copy in the mail If you have any lab test that is abnormal or we need to change your treatment, we will call you to review the results.  Testing/Procedures: NOT NEEDED  Follow-Up: At [facility], you and your health needs are our priority.  As part of our continuing mission to provide you with exceptional heart care, we have created designated Provider Care Teams.  These Care Teams include your primary Cardiologist (physician) and Advanced Practice Providers (APPs -  Physician Assistants and Nurse Practitioners) who all work together to provide you with the care you need, when you need it. You will need a follow up appointment in 4 MONTHS     .  Please call our office 2 months in advance to schedule this appointment.  You may see Glenetta Hew, MD or one of the following Advanced Practice Providers on your designated Care Team:   Rosaria Ferries, PA-C . Jory Sims, DNP, ANP  Any Other Special Instructions Will Be Listed Below (If Applicable).  YOU ARE CLEARED FROM A CARDIAC STANDPOINT FOR UPCOMING BREAST  SURGERY   Studies Ordered:   Orders Placed This Encounter  Procedures  . EKG 12-Lead      Glenetta Hew, M.D., M.S. Interventional Cardiologist   Pager # [phone] Phone # [phone] [address] [place], [place] [address]   Thank you for choosing Heartcare  at [facility]!!

## 2018-08-24 NOTE — Assessment & Plan Note (Signed)
Is on stable dose of pravastatin.  Follow-up PCP.  LDL is 103.  Target with her risk factors would be at least 100 or less.

## 2018-08-24 NOTE — Patient Instructions (Addendum)
Medication Instructions:  NO CHANGES  If you need a refill on your cardiac medications before your next appointment, please call your pharmacy.   Lab work: NOT NEEDED If you have labs (blood work) drawn today and your tests are completely normal, you will receive your results only by: Marland Kitchen MyChart Message (if you have MyChart) OR . A paper copy in the mail If you have any lab test that is abnormal or we need to change your treatment, we will call you to review the results.  Testing/Procedures: NOT NEEDED  Follow-Up: At [facility], you and your health needs are our priority.  As part of our continuing mission to provide you with exceptional heart care, we have created designated Provider Care Teams.  These Care Teams include your primary Cardiologist (physician) and Advanced Practice Providers (APPs -  Physician Assistants and Nurse Practitioners) who all work together to provide you with the care you need, when you need it. You will need a follow up appointment in 4 MONTHS     .  Please call our office 2 months in advance to schedule this appointment.  You may see Glenetta Hew, MD or one of the following Advanced Practice Providers on your designated Care Team:   Rosaria Ferries, PA-C . Jory Sims, DNP, ANP  Any Other Special Instructions Will Be Listed Below (If Applicable).  YOU ARE CLEARED FROM A CARDIAC STANDPOINT FOR UPCOMING BREAST  SURGERY

## 2018-08-28 ENCOUNTER — Encounter (HOSPITAL_BASED_OUTPATIENT_CLINIC_OR_DEPARTMENT_OTHER): Payer: Self-pay | Admitting: *Deleted

## 2018-08-28 ENCOUNTER — Telehealth: Payer: Self-pay | Admitting: Genetic Counselor

## 2018-08-28 ENCOUNTER — Other Ambulatory Visit: Payer: Self-pay

## 2018-08-28 ENCOUNTER — Encounter: Payer: Self-pay | Admitting: Genetic Counselor

## 2018-08-28 DIAGNOSIS — Z1379 Encounter for other screening for genetic and chromosomal anomalies: Secondary | ICD-10-CM | POA: Insufficient documentation

## 2018-08-28 NOTE — Telephone Encounter (Signed)
Revealed negative genetic testing.  Discussed that we do not know why she has breast cancer or why there is cancer in the family. It could be due to a different gene that we are not testing, or maybe our current technology may not be able to pick something up.  It will be important for her to keep in contact with genetics to keep up with whether additional testing may be needed. 

## 2018-08-29 ENCOUNTER — Ambulatory Visit: Payer: Self-pay | Admitting: Genetic Counselor

## 2018-08-29 DIAGNOSIS — Z1379 Encounter for other screening for genetic and chromosomal anomalies: Secondary | ICD-10-CM

## 2018-08-29 NOTE — Progress Notes (Signed)
HPI:  Ms. Pflieger was previously seen in the [place] clinic due to a personal and family history of cancer and concerns regarding a hereditary predisposition to cancer. Please refer to our prior cancer genetics clinic note for more information regarding Ms. Lindahl's medical, social and family histories, and our assessment and recommendations, at the time. Ms. Mimnaugh recent genetic test results were disclosed to her, as were recommendations warranted by these results. These results and recommendations are discussed in more detail below.  CANCER HISTORY:    Ductal carcinoma in situ (DCIS) of right breast   07/27/2018 Initial Diagnosis    Ductal carcinoma in situ (DCIS) of right breast    08/27/2018 Genetic Testing    Negative genetic testing on the STAT panel and the common hereditary cancer gene panel.  The Hereditary Gene Panel offered by [facility] includes sequencing and/or deletion duplication testing of the following 47 genes: APC, ATM, AXIN2, BARD1, BMPR1A, BRCA1, BRCA2, BRIP1, CDH1, CDK4, CDKN2A (p14ARF), CDKN2A (p16INK4a), CHEK2, CTNNA1, DICER1, EPCAM (Deletion/duplication testing only), GREM1 (promoter region deletion/duplication testing only), KIT, MEN1, MLH1, MSH2, MSH3, MSH6, MUTYH, NBN, NF1, NHTL1, PALB2, PDGFRA, PMS2, POLD1, POLE, PTEN, RAD50, RAD51C, RAD51D, SDHB, SDHC, SDHD, SMAD4, SMARCA4. STK11, TP53, TSC1, TSC2, and VHL.  The following genes were evaluated for sequence changes only: SDHA and HOXB13 c.251G>A variant only. The report date is 08/27/2018.     FAMILY HISTORY:  We obtained a detailed, 4-generation family history.  Significant diagnoses are listed below: Family History  Problem Relation Age of Onset  . Hypertension Mother   . Kidney failure Mother   . Congestive Heart Failure Mother   . Heart Problems Father   . Stroke Father   . Pneumonia Father   . Heart Problems Sister 40       heart transplant  . Stroke Paternal Grandmother   . Dementia  Paternal Grandmother   . Stroke Paternal Grandfather   . Cardiomyopathy Sister   . Ovarian cancer Sister 69       d. 33  . Thyroid disease Sister   . Thyroid disease Sister   . Thyroid cancer Niece 24  . Breast cancer Neg Hx     The patient has four children, three boys and one girl, who are all cancer free.  She has five sisters.  Two sisters died young from heart disease, one at 15 from cardiomyopathy and the other after a heart transplant at age 21.  One sister had ovarian cancer at 85 and died at 22, and the remaining two sisters have had thyroid disease and had their thyroids out.  One of these sisters has a daughter with thyroid cancer at 42.  Both parents are deceased.    The patient's mother died in her sleep at 19.  SE had four sisters and two brothers, none reportedly had cancer.  The maternal grandparents are deceased from on cancer related issues.  The patient's father died of pneumonia.  He had two sister and four brothers.  Two brothers are living in their 80's.  None reportedly had cancer.  The paternal grandparents are deceased from stroke and dementia.   Ms. Eugenio is unaware of previous family history of genetic testing for hereditary cancer risks. Patient's maternal ancestors are of Caucasian, Native [place] and [place] American descent, and paternal ancestors are of African American descent. There is no reported Ashkenazi Jewish ancestry. There is no known consanguinity.  GENETIC TEST RESULTS: Genetic testing reported out on August 27, 2018 through  the Common Hereditary Cancer Panel cancer panel found no deleterious mutations.  The Hereditary Gene Panel offered by [facility] includes sequencing and/or deletion duplication testing of the following 47 genes: APC, ATM, AXIN2, BARD1, BMPR1A, BRCA1, BRCA2, BRIP1, CDH1, CDK4, CDKN2A (p14ARF), CDKN2A (p16INK4a), CHEK2, CTNNA1, DICER1, EPCAM (Deletion/duplication testing only), GREM1 (promoter region deletion/duplication testing only),  KIT, MEN1, MLH1, MSH2, MSH3, MSH6, MUTYH, NBN, NF1, NHTL1, PALB2, PDGFRA, PMS2, POLD1, POLE, PTEN, RAD50, RAD51C, RAD51D, SDHB, SDHC, SDHD, SMAD4, SMARCA4. STK11, TP53, TSC1, TSC2, and VHL.  The following genes were evaluated for sequence changes only: SDHA and HOXB13 c.251G>A variant only.  The test report has been scanned into EPIC and is located under the Molecular Pathology section of the Results Review tab.    We discussed with Ms. Perfecto that since the current genetic testing is not perfect, it is possible there may be a gene mutation in one of these genes that current testing cannot detect, but that chance is small.  We also discussed, that it is possible that another gene that has not yet been discovered, or that we have not yet tested, is responsible for the cancer diagnoses in the family, and it is, therefore, important to remain in touch with cancer genetics in the future so that we can continue to offer Ms. Basista the most up to date genetic testing.   CANCER SCREENING RECOMMENDATIONS:  This result is reassuring and indicates that Ms. Foree likely does not have an increased risk for a future cancer due to a mutation in one of these genes. This normal test also suggests that Ms. Guadron's cancer was most likely not due to an inherited predisposition associated with one of these genes.  Most cancers happen by chance and this negative test suggests that her cancer falls into this category.  We, therefore, recommended she continue to follow the cancer management and screening guidelines provided by her oncology and primary healthcare provider.   An individual's cancer risk and medical management are not determined by genetic test results alone. Overall cancer risk assessment incorporates additional factors, including personal medical history, family history, and any available genetic information that may result in a personalized plan for cancer prevention and surveillance.  RECOMMENDATIONS FOR  FAMILY MEMBERS:  Women in this family might be at some increased risk of developing cancer, over the general population risk, simply due to the family history of cancer.  We recommended women in this family have a yearly mammogram beginning at age 6, or 71 years younger than the earliest onset of cancer, an annual clinical breast exam, and perform monthly breast self-exams. Women in this family should also have a gynecological exam as recommended by their primary provider. All family members should have a colonoscopy by age 15.  FOLLOW-UP: Lastly, we discussed with Ms. Sparr that cancer genetics is a rapidly advancing field and it is possible that new genetic tests will be appropriate for her and/or her family members in the future. We encouraged her to remain in contact with cancer genetics on an annual basis so we can update her personal and family histories and let her know of advances in cancer genetics that may benefit this family.   Our contact number was provided. Ms. Broome questions were answered to her satisfaction, and she knows she is welcome to call us at anytime with additional questions or concerns.   Roma Kayser, MS, [facility] Certified Genetic Counselor [email or web address]

## 2018-08-30 ENCOUNTER — Ambulatory Visit
Admission: RE | Admit: 2018-08-30 | Discharge: 2018-08-30 | Disposition: A | Discharge status: Home / Self Care | Payer: Medicare Other | Source: Ambulatory Visit | Attending: Surgery | Admitting: Surgery

## 2018-08-30 DIAGNOSIS — D0591 Unspecified type of carcinoma in situ of right breast: Secondary | ICD-10-CM

## 2018-08-30 NOTE — Progress Notes (Signed)
Ensure pre surgery drink given with instructions to complete by [place], surgical soap given with instructions, pt verbalized understanding.

## 2018-08-31 NOTE — H&P (Signed)
Christina Hernandez  Location: [facility] Patient #: [id number] DOB: 1950/12/05 Undefined / Language: Christina Hernandez / Race: Black or African American Female  History of Present Illness   The patient is a 68 year old female who presents with a complaint of breast cancer.  The PCP is Dr. Glendale Hernandez  The patient was referred by Dr. Aquilla Hernandez  The pateint is at the [facility] - Oncology is Drs. Christina Hernandez and Christina Hernandez She is here with her daughter, Christina Hernandez, and husband, Christina Hernandez.  She went for her routine mammograms. She did not feel anything in her breast. Her LMP was about age 44. She is not on hormones. She has a sister who died of ovarian ca.  Mammograms: at the [facility] - 07/10/2018 and 07/21/2018 - Indeterminate mass more superficially located over the 3:30 position of the right breast 2 cm from the nipple measuring 3 x 4 x 4 mm. Biopsy: Right breast biopsy, 3:30 - 07/24/2018 ([id number]) - DCIS, ER - 100%, PR - 100% Family history of breast or ovarian cancer: sister who died of ovarian cancer On hormone therapy: No  I discussed the options for breast cancer treatment with the patient. The patient is at the [facility], which includes medical oncology and radiation oncology. I discussed the surgical options of lumpectomy vs. mastectomy. If mastectomy, there is the possibility of reconstruction. I discussed the options of lymph node biopsy. The treatment plan depends on the pathologic staging of the tumor and the patient's personal wishes. The risks of surgery include, but are not limited to, bleeding, infection, the need for further surgery, and nerve injury. The patient has been given literature on the treatment of breast cancer.  PLan: 1) Right breast lumpectomy (seed loc), 2) Rad tx, 3) Antiestrogen  Past Medical History: 1. Right breast cancer 2. HTN x 6 years 3. DM since 2014 4.  Hypercholesterolemia 5. left face trigeminal neuralgia On gabapentin/cympbalta without much help 6. colonoscopy by Dr. Collene Hernandez - 2019 7. Arthritis of knees. she has had arthroscopic surgery in 2015 (? physician) 8. Thyroidectomy - Dr. Benjamine Hernandez - 09/08/2016 - for goiter 9.  genetics negative  Social History: Married. She is here with her daughter, Christina Hernandez, and husband, Christina Hernandez.  She has 3 sons and the one daughter. Christina Hernandez - 2 yo, Christina Hernandez - 68 yo, Christina Hernandez - 70 yo, and Christina Hernandez - 70 yo   Medication History Christina Pummel, RN; 08/02/2018 7:45 AM) Medications Reconciled   Physical Exam  General: AA F who is alert and generally healthy appearing. Skin: Inspection and palpation of the skin unremarkable.  Eyes: Conjunctivae white, pupils equal. Face, ears, nose, mouth, and throat: Face - normal. Normal ears and nose. Lips and teeth normal.  Neck: Supple. No mass. Trachea midline. No thyroid mass.  Lymph Nodes: No supraclavicular or cervical adenopathy. No axillary adenopathy.  Lungs: Normal respiratory effort. Clear to auscultation and symmetric breath sounds. Cardiovascular: Regular rate and rythm. Normal auscultation of the heart. No murmur or rub.  Breasts: Right - She has a bruise at the 4 o'clock position. I do not feel a mass. left - No mass or nodule.  Abdomen: Soft. No mass. Liver and spleen not palpable. No tenderness. No hernia. Normal bowel sounds.  Rectal: Not done.  Musculoskeletal/extremities: Normal gait. Good strength and ROM in upper and lower extremities.   Neurologic: Grossly intact to motor and sensory function.   Psychiatric: Has normal mood and affect. Judgement and insight appear normal.  Assessment & Plan  1.  BREAST CANCER, STAGE 0, RIGHT (D05.91)  Story: Right breast biopsy, 3:30 - 07/24/2018 ([id number]) - DCIS, ER - 100%, PR - 100%  Oncology - Christina Hernandez and Christina Hernandez  PLan:   1) Right breast lumpectomy  (seed loc),   2) Rad tx,  3) Antiestrogen  4) Genetics - negtive  2. HTN x 6 years 3. DM since 2014 4. Hypercholesterolemia 5. left face trigeminal neuralgia On gabapentin/cympbalta without much help 6. Arthritis of knees. she has had arthroscopic surgery in 2015 (? physician)   Christina Overall, MD, [facility] Pager: [phone] Office phone:  [phone]

## 2018-09-01 ENCOUNTER — Ambulatory Visit
Admission: RE | Admit: 2018-09-01 | Discharge: 2018-09-01 | Disposition: A | Discharge status: Home / Self Care | Payer: Medicare Other | Source: Ambulatory Visit | Attending: Surgery | Admitting: Surgery

## 2018-09-01 ENCOUNTER — Ambulatory Visit (HOSPITAL_BASED_OUTPATIENT_CLINIC_OR_DEPARTMENT_OTHER): Payer: Medicare Other | Admitting: Certified Registered"

## 2018-09-01 ENCOUNTER — Other Ambulatory Visit: Payer: Self-pay

## 2018-09-01 ENCOUNTER — Encounter (HOSPITAL_BASED_OUTPATIENT_CLINIC_OR_DEPARTMENT_OTHER)
Admission: RE | Disposition: A | Discharge status: Home / Self Care | Payer: Self-pay | Source: Home / Self Care | Attending: Surgery

## 2018-09-01 ENCOUNTER — Encounter (HOSPITAL_BASED_OUTPATIENT_CLINIC_OR_DEPARTMENT_OTHER): Payer: Self-pay | Admitting: *Deleted

## 2018-09-01 ENCOUNTER — Ambulatory Visit (HOSPITAL_BASED_OUTPATIENT_CLINIC_OR_DEPARTMENT_OTHER)
Admission: RE | Admit: 2018-09-01 | Discharge: 2018-09-01 | Disposition: A | Discharge status: Home / Self Care | Payer: Medicare Other | Attending: Surgery | Admitting: Surgery

## 2018-09-01 DIAGNOSIS — D0511 Intraductal carcinoma in situ of right breast: Secondary | ICD-10-CM | POA: Insufficient documentation

## 2018-09-01 DIAGNOSIS — G500 Trigeminal neuralgia: Secondary | ICD-10-CM | POA: Diagnosis not present

## 2018-09-01 DIAGNOSIS — E119 Type 2 diabetes mellitus without complications: Secondary | ICD-10-CM | POA: Insufficient documentation

## 2018-09-01 DIAGNOSIS — E7800 Pure hypercholesterolemia, unspecified: Secondary | ICD-10-CM | POA: Insufficient documentation

## 2018-09-01 DIAGNOSIS — Z8041 Family history of malignant neoplasm of ovary: Secondary | ICD-10-CM | POA: Insufficient documentation

## 2018-09-01 DIAGNOSIS — I10 Essential (primary) hypertension: Secondary | ICD-10-CM | POA: Insufficient documentation

## 2018-09-01 DIAGNOSIS — D0591 Unspecified type of carcinoma in situ of right breast: Secondary | ICD-10-CM

## 2018-09-01 DIAGNOSIS — Z79899 Other long term (current) drug therapy: Secondary | ICD-10-CM | POA: Diagnosis not present

## 2018-09-01 HISTORY — PX: BREAST LUMPECTOMY WITH RADIOACTIVE SEED LOCALIZATION: SHX6424

## 2018-09-01 HISTORY — PX: BREAST LUMPECTOMY: SHX2

## 2018-09-01 LAB — GLUCOSE, CAPILLARY
Glucose-Capillary: 75 mg/dL (ref 70–99)
Glucose-Capillary: 86 mg/dL (ref 70–99)

## 2018-09-01 SURGERY — BREAST LUMPECTOMY WITH RADIOACTIVE SEED LOCALIZATION
Anesthesia: General | Site: Breast | Laterality: Right

## 2018-09-01 MED ORDER — EPHEDRINE 5 MG/ML INJ
INTRAVENOUS | Status: AC
  Filled 2018-09-01: qty 10

## 2018-09-01 MED ORDER — ONDANSETRON HCL 4 MG/2ML IJ SOLN
INTRAMUSCULAR | Status: AC
  Filled 2018-09-01: qty 2

## 2018-09-01 MED ORDER — CEFAZOLIN SODIUM-DEXTROSE 2-4 GM/100ML-% IV SOLN
2.0000 g | INTRAVENOUS | Status: AC
  Administered 2018-09-01: 2 g via INTRAVENOUS

## 2018-09-01 MED ORDER — FENTANYL CITRATE (PF) 100 MCG/2ML IJ SOLN
25.0000 ug | INTRAMUSCULAR | Status: DC | PRN
  Administered 2018-09-01: 25 ug via INTRAVENOUS

## 2018-09-01 MED ORDER — PROPOFOL 10 MG/ML IV BOLUS
INTRAVENOUS | Status: DC | PRN
  Administered 2018-09-01: 150 mg via INTRAVENOUS

## 2018-09-01 MED ORDER — ONDANSETRON HCL 4 MG/2ML IJ SOLN: 4.0000 mg | Freq: Once | INTRAMUSCULAR | Status: DC | PRN

## 2018-09-01 MED ORDER — HYDROCODONE-ACETAMINOPHEN 5-325 MG PO TABS: 1.0000 | ORAL_TABLET | Freq: Four times a day (QID) | ORAL | 0 refills | Status: DC | PRN

## 2018-09-01 MED ORDER — DEXAMETHASONE SODIUM PHOSPHATE 10 MG/ML IJ SOLN
INTRAMUSCULAR | Status: AC
  Filled 2018-09-01: qty 1

## 2018-09-01 MED ORDER — SCOPOLAMINE 1 MG/3DAYS TD PT72: 1.0000 | MEDICATED_PATCH | Freq: Once | TRANSDERMAL | Status: DC | PRN

## 2018-09-01 MED ORDER — MIDAZOLAM HCL 2 MG/2ML IJ SOLN
INTRAMUSCULAR | Status: AC
  Filled 2018-09-01: qty 2

## 2018-09-01 MED ORDER — OXYCODONE HCL 5 MG PO TABS
ORAL_TABLET | ORAL | Status: AC
  Filled 2018-09-01: qty 1

## 2018-09-01 MED ORDER — ACETAMINOPHEN 500 MG PO TABS
ORAL_TABLET | ORAL | Status: AC
  Filled 2018-09-01: qty 2

## 2018-09-01 MED ORDER — MIDAZOLAM HCL 2 MG/2ML IJ SOLN
1.0000 mg | INTRAMUSCULAR | Status: DC | PRN
  Administered 2018-09-01: 1 mg via INTRAVENOUS

## 2018-09-01 MED ORDER — ONDANSETRON HCL 4 MG/2ML IJ SOLN
INTRAMUSCULAR | Status: DC | PRN
  Administered 2018-09-01: 4 mg via INTRAVENOUS

## 2018-09-01 MED ORDER — CHLORHEXIDINE GLUCONATE CLOTH 2 % EX PADS: 6.0000 | MEDICATED_PAD | Freq: Once | CUTANEOUS | Status: DC

## 2018-09-01 MED ORDER — DEXAMETHASONE SODIUM PHOSPHATE 4 MG/ML IJ SOLN
INTRAMUSCULAR | Status: DC | PRN
  Administered 2018-09-01: 4 mg via INTRAVENOUS

## 2018-09-01 MED ORDER — FENTANYL CITRATE (PF) 100 MCG/2ML IJ SOLN
INTRAMUSCULAR | Status: AC
  Filled 2018-09-01: qty 2

## 2018-09-01 MED ORDER — SUCCINYLCHOLINE CHLORIDE 200 MG/10ML IV SOSY
PREFILLED_SYRINGE | INTRAVENOUS | Status: AC
  Filled 2018-09-01: qty 10

## 2018-09-01 MED ORDER — OXYCODONE HCL 5 MG PO TABS
5.0000 mg | ORAL_TABLET | Freq: Once | ORAL | Status: AC | PRN
  Administered 2018-09-01: 5 mg via ORAL

## 2018-09-01 MED ORDER — OXYCODONE HCL 5 MG/5ML PO SOLN: 5.0000 mg | Freq: Once | ORAL | Status: AC | PRN

## 2018-09-01 MED ORDER — PHENYLEPHRINE 40 MCG/ML (10ML) SYRINGE FOR IV PUSH (FOR BLOOD PRESSURE SUPPORT)
PREFILLED_SYRINGE | INTRAVENOUS | Status: AC
  Filled 2018-09-01: qty 10

## 2018-09-01 MED ORDER — BUPIVACAINE-EPINEPHRINE 0.25% -1:200000 IJ SOLN
INTRAMUSCULAR | Status: DC | PRN
  Administered 2018-09-01: 20 mL

## 2018-09-01 MED ORDER — FENTANYL CITRATE (PF) 100 MCG/2ML IJ SOLN
50.0000 ug | INTRAMUSCULAR | Status: AC | PRN
  Administered 2018-09-01: 25 ug via INTRAVENOUS
  Administered 2018-09-01: 50 ug via INTRAVENOUS
  Administered 2018-09-01: 25 ug via INTRAVENOUS

## 2018-09-01 MED ORDER — ACETAMINOPHEN 500 MG PO TABS
1000.0000 mg | ORAL_TABLET | ORAL | Status: AC
  Administered 2018-09-01: 1000 mg via ORAL

## 2018-09-01 MED ORDER — CEFAZOLIN SODIUM-DEXTROSE 2-4 GM/100ML-% IV SOLN
INTRAVENOUS | Status: AC
  Filled 2018-09-01: qty 100

## 2018-09-01 MED ORDER — LIDOCAINE 2% (20 MG/ML) 5 ML SYRINGE
INTRAMUSCULAR | Status: AC
  Filled 2018-09-01: qty 5

## 2018-09-01 MED ORDER — LACTATED RINGERS IV SOLN
INTRAVENOUS | Status: DC
  Administered 2018-09-01 (×3): via INTRAVENOUS

## 2018-09-01 SURGICAL SUPPLY — 56 items
ADH SKN CLS APL DERMABOND .7 (GAUZE/BANDAGES/DRESSINGS) ×1
APL PRP STRL LF DISP 70% ISPRP (MISCELLANEOUS) ×1
APL SKNCLS STERI-STRIP NONHPOA (GAUZE/BANDAGES/DRESSINGS)
BENZOIN TINCTURE PRP APPL 2/3 (GAUZE/BANDAGES/DRESSINGS) IMPLANT
BINDER BREAST LRG (GAUZE/BANDAGES/DRESSINGS) IMPLANT
BINDER BREAST MEDIUM (GAUZE/BANDAGES/DRESSINGS) IMPLANT
BINDER BREAST XLRG (GAUZE/BANDAGES/DRESSINGS) ×1 IMPLANT
BINDER BREAST XXLRG (GAUZE/BANDAGES/DRESSINGS) IMPLANT
BLADE HEX COATED 2.75 (ELECTRODE) IMPLANT
BLADE SURG 10 STRL SS (BLADE) ×2 IMPLANT
BLADE SURG 15 STRL LF DISP TIS (BLADE) ×1 IMPLANT
BLADE SURG 15 STRL SS (BLADE) ×2
CANISTER SUC SOCK COL 7IN (MISCELLANEOUS) IMPLANT
CANISTER SUCT 1200ML W/VALVE (MISCELLANEOUS) ×2 IMPLANT
CHLORAPREP W/TINT 26 (MISCELLANEOUS) ×2 IMPLANT
CLIP VESOCCLUDE SM WIDE 6/CT (CLIP) ×1 IMPLANT
COVER BACK TABLE 60X90IN (DRAPES) ×2 IMPLANT
COVER MAYO STAND STRL (DRAPES) ×2 IMPLANT
COVER PROBE W GEL 5X96 (DRAPES) ×2 IMPLANT
COVER WAND RF STERILE (DRAPES) IMPLANT
DECANTER SPIKE VIAL GLASS SM (MISCELLANEOUS) IMPLANT
DERMABOND ADVANCED (GAUZE/BANDAGES/DRESSINGS) ×1
DERMABOND ADVANCED .7 DNX12 (GAUZE/BANDAGES/DRESSINGS) ×1 IMPLANT
DRAPE LAPAROSCOPIC ABDOMINAL (DRAPES) ×1 IMPLANT
DRAPE LAPAROTOMY 100X72 PEDS (DRAPES) ×1 IMPLANT
DRAPE UTILITY XL STRL (DRAPES) ×2 IMPLANT
DRSG PAD ABDOMINAL 8X10 ST (GAUZE/BANDAGES/DRESSINGS) ×1 IMPLANT
ELECT COATED BLADE 2.86 ST (ELECTRODE) ×2 IMPLANT
ELECT REM PT RETURN 9FT ADLT (ELECTROSURGICAL) ×2
ELECTRODE REM PT RTRN 9FT ADLT (ELECTROSURGICAL) ×1 IMPLANT
GAUZE SPONGE 4X4 12PLY STRL LF (GAUZE/BANDAGES/DRESSINGS) IMPLANT
GLOVE BIO SURGEON STRL SZ 6.5 (GLOVE) ×1 IMPLANT
GLOVE EXAM NITRILE MD LF STRL (GLOVE) ×1 IMPLANT
GLOVE SURG SIGNA 7.5 PF LTX (GLOVE) ×3 IMPLANT
GOWN STRL REUS W/ TWL LRG LVL3 (GOWN DISPOSABLE) ×1 IMPLANT
GOWN STRL REUS W/ TWL XL LVL3 (GOWN DISPOSABLE) ×1 IMPLANT
GOWN STRL REUS W/TWL LRG LVL3 (GOWN DISPOSABLE) ×2
GOWN STRL REUS W/TWL XL LVL3 (GOWN DISPOSABLE) ×2
KIT MARKER MARGIN INK (KITS) ×2 IMPLANT
NDL HYPO 25X1 1.5 SAFETY (NEEDLE) ×1 IMPLANT
NEEDLE HYPO 25X1 1.5 SAFETY (NEEDLE) ×2 IMPLANT
NS IRRIG 1000ML POUR BTL (IV SOLUTION) ×1 IMPLANT
PACK BASIN DAY SURGERY FS (CUSTOM PROCEDURE TRAY) ×2 IMPLANT
PENCIL BUTTON HOLSTER BLD 10FT (ELECTRODE) ×2 IMPLANT
SHEET MEDIUM DRAPE 40X70 STRL (DRAPES) IMPLANT
SLEEVE SCD COMPRESS KNEE MED (MISCELLANEOUS) ×2 IMPLANT
SPONGE LAP 18X18 RF (DISPOSABLE) ×2 IMPLANT
STRIP CLOSURE SKIN 1/4X4 (GAUZE/BANDAGES/DRESSINGS) IMPLANT
SUT MNCRL AB 4-0 PS2 18 (SUTURE) ×2 IMPLANT
SUT VICRYL 3-0 CR8 SH (SUTURE) ×2 IMPLANT
SYR BULB 3OZ (MISCELLANEOUS) ×1 IMPLANT
SYR CONTROL 10ML LL (SYRINGE) ×2 IMPLANT
TOWEL GREEN STERILE FF (TOWEL DISPOSABLE) ×2 IMPLANT
TRAY FAXITRON CT DISP (TRAY / TRAY PROCEDURE) ×2 IMPLANT
TUBE CONNECTING 20X1/4 (TUBING) ×2 IMPLANT
YANKAUER SUCT BULB TIP NO VENT (SUCTIONS) ×2 IMPLANT

## 2018-09-01 NOTE — Anesthesia Preprocedure Evaluation (Addendum)
Anesthesia Evaluation  Patient identified by MRN, date of birth, ID band Patient awake    Reviewed: Allergy & Precautions, NPO status , Patient's Chart, lab work & pertinent test results  Airway Mallampati: II  TM Distance: >3 FB Neck ROM: Full    Dental  (+) Teeth Intact Upper permanent bridge:   Pulmonary    breath sounds clear to auscultation       Cardiovascular hypertension,  Rhythm:Regular Rate:Normal     Neuro/Psych    GI/Hepatic   Endo/Other  diabetes  Renal/GU      Musculoskeletal   Abdominal   Peds  Hematology   Anesthesia Other Findings   Reproductive/Obstetrics                            Anesthesia Physical Anesthesia Plan  ASA: III  Anesthesia Plan: General   Post-op Pain Management:    Induction: Intravenous  PONV Risk Score and Plan: Ondansetron  Airway Management Planned: LMA  Additional Equipment:   Intra-op Plan:   Post-operative Plan:   Informed Consent: I have reviewed the patients History and Physical, chart, labs and discussed the procedure including the risks, benefits and alternatives for the proposed anesthesia with the patient or authorized representative who has indicated his/her understanding and acceptance.     Dental advisory given  Plan Discussed with: CRNA and Anesthesiologist  Anesthesia Plan Comments:         Anesthesia Quick Evaluation

## 2018-09-01 NOTE — Anesthesia Procedure Notes (Signed)
Procedure Name: LMA Insertion Date/Time: 09/01/2018 3:30 PM Performed by: Willa Frater, CRNA Pre-anesthesia Checklist: Patient identified, Emergency Drugs available, Suction available and Patient being monitored Patient Re-evaluated:Patient Re-evaluated prior to induction Oxygen Delivery Method: Circle system utilized Preoxygenation: Pre-oxygenation with 100% oxygen Induction Type: IV induction Ventilation: Mask ventilation without difficulty LMA: LMA inserted LMA Size: 4.0 Number of attempts: 1 Airway Equipment and Method: Bite block Placement Confirmation: positive ETCO2 Tube secured with: Tape Dental Injury: Teeth and Oropharynx as per pre-operative assessment

## 2018-09-01 NOTE — Interval H&P Note (Signed)
History and Physical Interval Note:  09/01/2018 2:51 PM  Christina Hernandez  has presented today for surgery, with the diagnosis of RIGHT BREAST CANCER  The various methods of treatment have been discussed with the patient and family.   Daughter is at the bed side.  The seed is in place.  After consideration of risks, benefits and other options for treatment, the patient has consented to  Procedure(s): RIGHT BREAST LUMPECTOMY WITH RADIOACTIVE SEED LOCALIZATION (Right) as a surgical intervention .  The patient's history has been reviewed, patient examined, no change in status, stable for surgery.  I have reviewed the patient's chart and labs.  Questions were answered to the patient's satisfaction.     Shann Medal

## 2018-09-01 NOTE — Discharge Instructions (Signed)
May take Norco or Tramadol for pain, but fo not take at the same time. Do not exceed 3000mg  of tylenol (acetaminophen) in a 24 hr period.   [facility] - DISCHARGE INSTRUCTIONS TO PATIENT  Activity:  Driving - May drive in one to 3 days, if doing well and off pain meds   Lifting - No lifting more than 15 pounds for 5 days, then no limit  Wound Care:   Leave bandage on for 2 days, then may remove bandage and shower.  Diet:  As tolerated.  Follow up appointment:  Call Dr. Pollie Friar office [facility]) at [phone] for an appointment in 2 to 3 weeks.  Medications and dosages:  Resume your home medications.  You have a prescription for:  Vicodin  Call Dr. Lucia Gaskins or his office  [phone]) if you have:  Temperature greater than 100.4,  Persistent nausea and vomiting,  Severe uncontrolled pain,  Redness, tenderness, or signs of infection (pain, swelling, redness, odor or green/yellow discharge around the site),  Difficulty breathing, headache or visual disturbances,  Any other questions or concerns you may have after discharge.  In an emergency, call 911 or go to an Emergency Department at a nearby hospital.   Post Anesthesia Home Care Instructions  Activity: Get plenty of rest for the remainder of the day. A responsible individual must stay with you for 24 hours following the procedure.  For the next 24 hours, DO NOT: -Drive a car -Paediatric nurse -Drink alcoholic beverages -Take any medication unless instructed by your physician -Make any legal decisions or sign important papers.  Meals: Start with liquid foods such as gelatin or soup. Progress to regular foods as tolerated. Avoid greasy, spicy, heavy foods. If nausea and/or vomiting occur, drink only clear liquids until the nausea and/or vomiting subsides. Call your physician if vomiting continues.  Special Instructions/Symptoms: Your throat may feel dry or sore from the anesthesia or  the breathing tube placed in your throat during surgery. If this causes discomfort, gargle with warm salt water. The discomfort should disappear within 24 hours.  If you had a scopolamine patch placed behind your ear for the management of post- operative nausea and/or vomiting:  1. The medication in the patch is effective for 72 hours, after which it should be removed.  Wrap patch in a tissue and discard in the trash. Wash hands thoroughly with soap and water. 2. You may remove the patch earlier than 72 hours if you experience unpleasant side effects which may include dry mouth, dizziness or visual disturbances. 3. Avoid touching the patch. Wash your hands with soap and water after contact with the patch.

## 2018-09-01 NOTE — Op Note (Addendum)
09/01/2018  4:26 PM  PATIENT:  Christina Hernandez DOB: Jun 10, 1951 MRN: [id number]  PREOP DIAGNOSIS:   RIGHT BREAST CANCER (DCIS)  POSTOP DIAGNOSIS:    Right breast cancer, 3:30 o'clock position (Tis, N0)  PROCEDURE:   Procedure(s): RIGHT BREAST LUMPECTOMY WITH RADIOACTIVE SEED LOCALIZATION,   SURGEON:   [name], M.D.  ANESTHESIA:   general  Anesthesiologist: Montez Hageman, MD; Nolon Nations, MD CRNA: Blocker, Ernesta Amble, CRNA  General  EBL:  minimal  ml  DRAINS:  none   LOCAL MEDICATIONS USED:   20 cc of 1/4% marcaine  SPECIMEN:   Right breast lumpectomy (6 color) and medial margin (painted and suture anterior)  COUNTS CORRECT:  YES  INDICATIONS FOR PROCEDURE:  Christina Hernandez is a 68 y.o. (DOB: July 15, 1950) AA female whose primary care physician is Glendale Chard, MD and comes for right breast lumpectomy.   [facility] with Drs. [place] and [place].  She has an area of DCIS at the 3:30 o'clock position in her right breast.  The options for breast cancer treatment have been discussed with the patient. She elected to proceed with lumpectomy.  An axillary sentinel lymph node biopsy is not necessary at this time.     The indications and potential complications of surgery were explained to the patient. Potential complications include, but are not limited to, bleeding, infection, the need for further surgery, and nerve injury.     She had a I131 seed placed on 08/30/2018 in her right breast at [facility].  The seed is in the 3:30 o'clock position of the right breast.    OPERATIVE NOTE:   The patient was taken to operating room # 8 at [facility] where she underwent a general anesthesia  supervised by Anesthesiologist: Montez Hageman, MD; Nolon Nations, MD CRNA: Blocker, Ernesta Amble, CRNA. Her right breast and axilla were prepped with  ChloraPrep and sterilely draped.    A time-out and the surgical check list was  reviewed.    I turned attention to the cancer which was about at the 3:30 o'clock position of the right breast.  I made a radial incision over the mass.  The mass/clip were superficial enough that I thought I would have trouble getting good margins if I came from the circumareolar approach.   I used the Neoprobe to identify the I131 seed.  I tried to excise an area around the tumor of at least 1 cm.    I excised this block of breast tissue approximately 3 cm by 4 cm  in diameter.   I painted the lumpectomy specimen with the 6 color paint kit and did a specimen mammogram which confirmed the mass, clip, and the seed were all in the right position in the specimen.  The specimen was sent to pathology who called back to confirm that they have the seed and the specimen.       I thought if one margin was close on the specimen mammogram, it would be the medial margin, so I took an additional medial margin.  I placed a suture anteriorly and painted the medial margin with two colors.   I then irrigated the wound with saline. I infiltrated approximately 20 mL of 1/4% Marcaine between the incisions. I placed 4 clips to mark biopsy cavity, at 12, 3, 6, and 9 o'clock.  I then closed all the wounds in layers using 3-0 Vicryl sutures for the deep layer. At the skin, I  closed the incision with a 4-0 Monocryl suture. The incision was then painted with Dermabond.  She had gauze place over the wounds and placed in a breast binder.   The patient tolerated the procedure well, was transported to the recovery room in good condition. Sponge and needle count were correct at the end of the case.   Final pathology is pending.   [name], MD, [facility] Surgery Pager: [phone] Office phone:  [phone]

## 2018-09-01 NOTE — Transfer of Care (Signed)
Immediate Anesthesia Transfer of Care Note  Patient: Christina Hernandez  Procedure(s) Performed: RIGHT BREAST LUMPECTOMY WITH RADIOACTIVE SEED LOCALIZATION (Right Breast)  Patient Location: PACU  Anesthesia Type:General  Level of Consciousness: awake, alert , oriented and drowsy  Airway & Oxygen Therapy: Patient Spontanous Breathing and Patient connected to face mask oxygen  Post-op Assessment: Report given to RN and Post -op Vital signs reviewed and stable  Post vital signs: Reviewed and stable  Last Vitals:  Vitals Value Taken Time  BP    Temp    Pulse 88 09/01/2018  4:32 PM  Resp    SpO2 98 % 09/01/2018  4:32 PM  Vitals shown include unvalidated device data.  Last Pain:  Vitals:   09/01/18 1202  TempSrc: Oral  PainSc: 0-No pain      Patients Stated Pain Goal: 3 (32/91/91 [id number])  Complications: No apparent anesthesia complications

## 2018-09-02 NOTE — Anesthesia Postprocedure Evaluation (Signed)
Anesthesia Post Note  Patient: Chief of Staff  Procedure(s) Performed: RIGHT BREAST LUMPECTOMY WITH RADIOACTIVE SEED LOCALIZATION (Right Breast)     Patient location during evaluation: PACU Anesthesia Type: General Level of consciousness: sedated and patient cooperative Pain management: pain level controlled Vital Signs Assessment: post-procedure vital signs reviewed and stable Respiratory status: spontaneous breathing Cardiovascular status: stable Anesthetic complications: no    Last Vitals:  Vitals:   09/01/18 1645 09/01/18 1736  BP: (!) 151/73 (!) 155/76  Pulse: 82 81  Resp: 17 18  Temp:  36.8 C  SpO2: 100% 94%    Last Pain:  Vitals:   09/01/18 1736  TempSrc:   PainSc: 2                  Nolon Nations

## 2018-09-04 ENCOUNTER — Encounter (HOSPITAL_BASED_OUTPATIENT_CLINIC_OR_DEPARTMENT_OTHER): Payer: Self-pay | Admitting: Surgery

## 2018-09-11 ENCOUNTER — Other Ambulatory Visit: Payer: Self-pay | Admitting: Nurse Practitioner

## 2018-09-19 ENCOUNTER — Ambulatory Visit
Admission: RE | Admit: 2018-09-19 | Discharge: 2018-09-19 | Disposition: A | Discharge status: Home / Self Care | Payer: Medicare Other | Source: Ambulatory Visit | Attending: Radiation Oncology | Admitting: Radiation Oncology

## 2018-09-19 ENCOUNTER — Other Ambulatory Visit: Payer: Self-pay

## 2018-09-19 DIAGNOSIS — D0511 Intraductal carcinoma in situ of right breast: Secondary | ICD-10-CM

## 2018-09-19 NOTE — Progress Notes (Signed)
Radiation Oncology         [phone] ________________________________  Name: Christina Hernandez        MRN: [id number]  Date of Service: 09/19/2018 DOB: March 07, 1951  NW:GNFAOZH, [name], MD  Magrinat, [name], MD     REFERRING PHYSICIAN: Magrinat, [name], MD   DIAGNOSIS: There were no encounter diagnoses. Stage 0, Right Breast, LIQ, Ductal Carcinoma In Situ, ER (+), PR (+), HER2 not indicated, grade 1  HISTORY OF PRESENT ILLNESS: Christina Hernandez is a 67 y.o. female who was originally seen in the multidisciplinary breast clinic for a new diagnosis of right breast cancer. She had routine screening mammography on 07/10/2018 showing a possible abnormality in the right breast. She underwent right diagnostic mammography with tomography and right breast ultrasonography on 07/21/2018 showing: Indeterminate mass more superficially located over the 3:30 position of the right breast 2 cm from the nipple measuring 3 x 4 x 4 mm. Adjacent 6 mm cyst located deeper at the 3:30 position of the right breast 2 cm from the nipple.  On 07/24/2018 she proceeded to biopsy of the right breast area in question. The pathology from this procedure showed: Breast, right, needle core biopsy, 3:30 with ductal carcinoma in situ Prognostic indicators significant for: ER, 100% positive and PR, 100% positive, both with strong staining intensity. She underwent right lumpectomy on 09/01/2018 and this revealed a 0.5 mm residual focus of DCIS, with clear margins. She is seen via telemedince encounter with Webex.    PREVIOUS RADIATION THERAPY: No   PAST MEDICAL HISTORY:  Past Medical History:  Diagnosis Date   Arthritis    Breast cancer, right breast ([place])    pending right breast lumpectomy on September 01, 2018   Diabetes mellitus type II, controlled ([place])    On Kombiglyze   Family history of ovarian cancer    Family history of thyroid cancer    GERD (gastroesophageal reflux disease)    rarely occurs- no meds    Hyperlipidemia associated with type 2 diabetes mellitus ([place])    Hypertension    Hypothyroidism    Trigeminal neuralgia of left side of face 2015   s/p Gamma knife       PAST SURGICAL HISTORY: Past Surgical History:  Procedure Laterality Date   BREAST LUMPECTOMY WITH RADIOACTIVE SEED LOCALIZATION Right 09/01/2018   Procedure: RIGHT BREAST LUMPECTOMY WITH RADIOACTIVE SEED LOCALIZATION;  Surgeon: [name], MD;  Location: [facility];  Service: General;  Laterality: Right;   COLONOSCOPY     Gamma Knife Left 04/08/2016   HYSTEROSCOPY W/D&C  05/24/2011   Procedure: DILATATION AND CURETTAGE (D&C) /HYSTEROSCOPY;  Surgeon: [name], MD;  Location: [facility];  Service: Gynecology;  Laterality: N/A;   KNEE SURGERY Right 06/10/15   torn ligament   THYROIDECTOMY N/A 09/08/2016   Procedure: TOTAL THYROIDECTOMY;  Surgeon: [name], MD;  Location: [facility];  Service: ENT;  Laterality: N/A;   TUBAL LIGATION     uterine polyp     WISDOM TOOTH EXTRACTION Left 2014     FAMILY HISTORY:  Family History  Problem Relation Age of Onset   Hypertension Mother    Kidney failure Mother    Congestive Heart Failure Mother    Heart Problems Father    Stroke Father    Pneumonia Father    Heart Problems Sister 70       heart transplant   Stroke Paternal Grandmother    Dementia Paternal Grandmother    Stroke Paternal  Grandfather    Cardiomyopathy Sister    Ovarian cancer Sister 104       d. 11   Thyroid disease Sister    Thyroid disease Sister    Thyroid cancer Niece 11   Breast cancer Neg Hx      SOCIAL HISTORY:  reports that she has never smoked. She has never used smokeless tobacco. She reports that she does not drink alcohol or use drugs. The patient is married and lives in [place]. She is a retired Education officer, museum. She and her husband foster parent two children.   ALLERGIES: Cyclobenzaprine   MEDICATIONS:  Current Outpatient Medications    Medication Sig Dispense Refill   acetaminophen (TYLENOL) 500 MG tablet Take 1 tablet (500 mg total) by mouth every 8 (eight) hours as needed. 30 tablet 1   cetirizine (ZYRTEC) 10 MG tablet Take 1 tablet (10 mg total) by mouth daily. (Patient taking differently: Take 10 mg by mouth daily as needed for allergies. ) 30 tablet 3   DULoxetine (CYMBALTA) 60 MG capsule Take 1 capsule (60 mg total) by mouth daily. 90 capsule 4   gabapentin (NEURONTIN) 300 MG capsule Take 3 capsules (900 mg total) by mouth 3 (three) times daily. 810 capsule 4   HYDROcodone-acetaminophen (NORCO/VICODIN) 5-325 MG tablet Take 1 tablet by mouth every 6 (six) hours as needed for moderate pain. 12 tablet 0   ibuprofen (ADVIL,MOTRIN) 400 MG tablet Take by mouth.     losartan-hydrochlorothiazide (HYZAAR) 50-12.5 MG tablet TAKE 1 TABLET BY ORAL ROUTE EVERY DAY 90 tablet 1   NONFORMULARY OR COMPOUNDED ITEM Compounded cream for right knee as needed     pravastatin (PRAVACHOL) 40 MG tablet Take 40 mg by mouth daily.     Saxagliptin-Metformin (KOMBIGLYZE XR) 5-500 MG TB24 Take 1 tablet by mouth daily before supper. 90 tablet 1   SYNTHROID 75 MCG tablet TAKE 1 TABLET DAILY MON-SAT, HOLD ON SUNDAY. 26 tablet 4   traMADol (ULTRAM) 50 MG tablet Take 1 tablet (50 mg total) by mouth every 8 (eight) hours as needed. 20 tablet 0   No current facility-administered medications for this encounter.      REVIEW OF SYSTEMS: On review of systems, the patient reports that she is doing well overall. Since her surgery she's felt well and reports that she's still got glue and bandages on her incision site.  No other complaints are noted.   PHYSICAL EXAM:  Wt Readings from Last 3 Encounters:  09/01/18 176 lb 2.4 oz (79.9 kg)  08/24/18 177 lb 6.4 oz (80.5 kg)  08/10/18 176 lb 6.4 oz (80 kg)   Temp Readings from Last 3 Encounters:  09/01/18 98.2 F (36.8 C)  08/10/18 (!) 97.5 F (36.4 C) (Oral)  08/02/18 97.8 F (36.6 C) (Oral)    BP Readings from Last 3 Encounters:  09/01/18 (!) 155/76  08/24/18 126/64  08/10/18 118/62   Pulse Readings from Last 3 Encounters:  09/01/18 81  08/24/18 89  08/10/18 86    In general this is a well appearing African American female in no acute distress. She's alert and oriented x4 and appropriate throughout the examination. Cardiopulmonary assessment is negative for acute distress and she exhibits normal effort. Breast exam is deferred to her simulation appointment.    ECOG = 0  0 - Asymptomatic (Fully active, able to carry on all predisease activities without restriction)  1 - Symptomatic but completely ambulatory (Restricted in physically strenuous activity but ambulatory and able to carry out  work of a light or sedentary nature. For example, light housework, office work)  2 - Symptomatic, <50% in bed during the day (Ambulatory and capable of all self care but unable to carry out any work activities. Up and about more than 50% of waking hours)  3 - Symptomatic, >50% in bed, but not bedbound (Capable of only limited self-care, confined to bed or chair 50% or more of waking hours)  4 - Bedbound (Completely disabled. Cannot carry on any self-care. Totally confined to bed or chair)  5 - Death   Eustace Pen MM, Creech RH, Tormey DC, et al. [phone]). "Toxicity and response criteria of the [facility]". [place] Oncol. 5 (6): 649-55    LABORATORY DATA:  Lab Results  Component Value Date   WBC 4.8 08/10/2018   HGB 11.6 08/10/2018   HCT 34.9 08/10/2018   MCV 81 08/10/2018   PLT 242 08/10/2018   Lab Results  Component Value Date   NA 141 08/10/2018   K 3.7 08/10/2018   CL 102 08/10/2018   CO2 23 08/10/2018   Lab Results  Component Value Date   ALT 23 08/10/2018   AST 23 08/10/2018   ALKPHOS 102 08/10/2018   BILITOT 0.5 08/10/2018      RADIOGRAPHY: Mm Breast Surgical Specimen  Result Date: 09/01/2018 CLINICAL DATA:  Status post RIGHT breast  lumpectomy for DCIS after radioactive seed localization. EXAM: SPECIMEN RADIOGRAPH OF THE RIGHT BREAST COMPARISON:  Previous exam(s). FINDINGS: Status post excision of the right breast. The radioactive seed and biopsy marker clip are present, completely intact, and were marked for pathology. IMPRESSION: Specimen radiograph of the right breast. Electronically Signed   By: Franki Cabot M.D.   On: 09/01/2018 16:09   Mm Rt Radioactive Seed Loc Mammo Guide  Result Date: 08/30/2018 CLINICAL DATA:  Patient presents for radioactive seed localization prior to lumpectomy for DCIS in the RIGHT breast. EXAM: MAMMOGRAPHIC GUIDED RADIOACTIVE SEED LOCALIZATION OF THE RIGHT BREAST COMPARISON:  07/24/2018 FINDINGS: Patient presents for radioactive seed localization prior to lumpectomy. I met with the patient and we discussed the procedure of seed localization including benefits and alternatives. We discussed the high likelihood of a successful procedure. We discussed the risks of the procedure including infection, bleeding, tissue injury and further surgery. We discussed the low dose of radioactivity involved in the procedure. Informed, written consent was given. The usual time-out protocol was performed immediately prior to the procedure. Using mammographic guidance, sterile technique, 1% lidocaine and an I-125 radioactive seed, the ribbon shaped clip in the MEDIAL portion of the RIGHT breast was localized using a MEDIAL to LATERAL approach. The follow-up mammogram images confirm the seed in the expected location and were marked for Dr. Lucia Gaskins. Follow-up survey of the patient confirms presence of the radioactive seed. Order number of I-125 seed:  [id number]. Total activity:  1.761 millicuries reference Date: 08/14/2018 The patient tolerated the procedure well and was released from the [facility]. She was given instructions regarding seed removal. IMPRESSION: Radioactive seed localization right breast. No apparent  complications. Electronically Signed   By: Nolon Nations M.D.   On: 08/30/2018 15:31       IMPRESSION/PLAN: 1. ER/PR positive, low grade DCIS of the right breast. Dr. Lisbeth Renshaw discussed the pathology findings and reviewed the nature of noninvasive breast disease. She appears to be doing well and appears ready to proceed in the next week wthi simulation and subsequent external radiotherapy to the breast. We reviewed the rationale  to consider radiotherapy in this situation. She will also be followed by Dr. Jana Hakim to discuss antiestrogen therapy. We discussed the risks, benefits, short, and long term effects of radiotherapy, and the patient is interested in proceeding. Dr. Lisbeth Renshaw discusses the delivery and logistics of radiotherapy and anticipates a course of 4 weeks of radiotherapy. She will be contacted by simulation staff to coordinate simulation next week. We anticipate her treatment beginning in the next 2-3 weeks.  In a visit lasting 25 minutes, greater than 50% of the time was spent face to face discussing her case, and coordinating the patient's care. Of note the recording for her appointment was paused and I accidentally didn't restart it immediately when Dr. Lisbeth Renshaw joined the conference. The time that was recorded was  14 minutes, 13 seconds  This encounter was provided by telemedicine platform Webex.  The patient has given verbal consent for this type of encounter and has been advised to only accept a meeting of this type in a secure network environment. The time spent during this encounter was 14 minute that was recorded, but nonrecorded time totalled to a complete visit of 25 minutes. The attendants for this meeting include Hayden Pedro, Dr. Lisbeth Renshaw, Rae Lips (Scribe)  and Keshonda Dorena Bodo, Mr. Gathright her husband, and their son [name].  During the encounter, Hayden Pedro was located at [facility].  Jerrilyn Morgann Woodburn was located at home.   The above documentation reflects my direct findings during this shared patient visit. Please see the separate note by Dr. Lisbeth Renshaw on this date for the remainder of the patient's plan of care.     Carola Rhine, PAC

## 2018-09-26 ENCOUNTER — Telehealth: Payer: Self-pay | Admitting: Oncology

## 2018-09-26 NOTE — Telephone Encounter (Signed)
R/s appt per sch message 3/24 - pt husband aware of apt change

## 2018-09-27 ENCOUNTER — Telehealth: Payer: Self-pay | Admitting: Radiation Oncology

## 2018-09-27 NOTE — Telephone Encounter (Signed)
The pt left a VM asking if her simulation was still scheduled. I called her back to let her know it was still on tomorrow. She requests it be moved back a few weeks. We will simulate on 4/14 at 9 am and treat the following week which is 7 weeks from her surgery.

## 2018-09-28 ENCOUNTER — Ambulatory Visit: Payer: Medicare Other | Admitting: Radiation Oncology

## 2018-10-09 ENCOUNTER — Other Ambulatory Visit: Payer: Self-pay | Admitting: Internal Medicine

## 2018-10-09 ENCOUNTER — Ambulatory Visit: Payer: Medicare Other | Admitting: Radiation Oncology

## 2018-10-10 ENCOUNTER — Ambulatory Visit
Admission: RE | Admit: 2018-10-10 | Discharge: 2018-10-10 | Disposition: A | Discharge status: Home / Self Care | Payer: Medicare Other | Source: Ambulatory Visit | Attending: Radiation Oncology | Admitting: Radiation Oncology

## 2018-10-10 ENCOUNTER — Other Ambulatory Visit: Payer: Self-pay

## 2018-10-10 ENCOUNTER — Ambulatory Visit: Payer: Medicare Other

## 2018-10-10 DIAGNOSIS — D0511 Intraductal carcinoma in situ of right breast: Secondary | ICD-10-CM

## 2018-10-10 NOTE — Progress Notes (Signed)
  Radiation Oncology         [phone] ________________________________  Name: Christina Hernandez MRN: [id number]  Date: 10/10/2018  DOB: 04-16-1951  Optical Surface Tracking Plan:  Since intensity modulated radiotherapy (IMRT) and 3D conformal radiation treatment methods are predicated on accurate and precise positioning for treatment, intrafraction motion monitoring is medically necessary to ensure accurate and safe treatment delivery.  The ability to quantify intrafraction motion without excessive ionizing radiation dose can only be performed with optical surface tracking. Accordingly, surface imaging offers the opportunity to obtain 3D measurements of patient position throughout IMRT and 3D treatments without excessive radiation exposure.  I am ordering optical surface tracking for this patient's upcoming course of radiotherapy. ________________________________  [name], MD 10/10/2018 3:21 PM    Reference:   Particia Jasper, et al. Surface imaging-based analysis of intrafraction motion for breast radiotherapy patients.Journal of [employer], n. 6, nov. 2014. ISSN [id number].   Available at: <http://www.jacmp.org/index.php/jacmp/article/view/4957>.

## 2018-10-10 NOTE — Progress Notes (Signed)
  Radiation Oncology         [phone]) [phone] ________________________________  Name: Christina Hernandez MRN: [id number]  Date: 10/10/2018  DOB: 1951-02-18   DIAGNOSIS:     ICD-10-CM   1. Ductal carcinoma in situ (DCIS) of right breast D05.11     SIMULATION AND TREATMENT PLANNING NOTE  The patient presented for simulation prior to beginning her course of radiation treatment for her diagnosis of right-sided breast cancer. The patient was placed in a supine position on a breast board. A customized vac-lock bag was constructed and this complex treatment device will be used on a daily basis during her treatment. In this fashion, a CT scan was obtained through the chest area and an isocenter was placed near the chest wall within the breast.  The patient will be planned to receive a course of radiation initially to a dose of 42.56 Gy. This will consist of a whole breast radiotherapy technique. To accomplish this, 2 customized blocks have been designed which will correspond to medial and lateral whole breast tangent fields. This treatment will be accomplished at 2.66 Gy per fraction. A forward planning technique will also be evaluated to determine if this approach improves the plan. It is anticipated that the patient will then receive a 8 Gy boost to the seroma cavity which has been contoured. This will be accomplished at 2 Gy per fraction.   This initial treatment will consist of a 3-D conformal technique. The seroma has been contoured as the primary target structure. Additionally, dose volume histograms of both this target as well as the lungs and heart will also be evaluated. Such an approach is necessary to ensure that the target area is adequately covered while the nearby critical  normal structures are adequately spared.  Plan:  The final anticipated total dose therefore will correspond to 50.56 Gy.    _______________________________   Jodelle Gross, MD, PhD

## 2018-10-11 ENCOUNTER — Ambulatory Visit: Payer: Medicare Other

## 2018-10-11 DIAGNOSIS — D0511 Intraductal carcinoma in situ of right breast: Secondary | ICD-10-CM | POA: Diagnosis not present

## 2018-10-12 ENCOUNTER — Ambulatory Visit: Payer: Medicare Other

## 2018-10-13 ENCOUNTER — Ambulatory Visit: Payer: Medicare Other

## 2018-10-16 ENCOUNTER — Ambulatory Visit
Admission: RE | Admit: 2018-10-16 | Discharge: 2018-10-16 | Disposition: A | Discharge status: Home / Self Care | Payer: Medicare Other | Source: Ambulatory Visit | Attending: Radiation Oncology | Admitting: Radiation Oncology

## 2018-10-16 ENCOUNTER — Ambulatory Visit: Payer: Medicare Other

## 2018-10-16 ENCOUNTER — Other Ambulatory Visit: Payer: Self-pay

## 2018-10-16 DIAGNOSIS — D0511 Intraductal carcinoma in situ of right breast: Secondary | ICD-10-CM | POA: Diagnosis not present

## 2018-10-17 ENCOUNTER — Ambulatory Visit
Admission: RE | Admit: 2018-10-17 | Discharge: 2018-10-17 | Disposition: A | Discharge status: Home / Self Care | Payer: Medicare Other | Source: Ambulatory Visit | Attending: Radiation Oncology | Admitting: Radiation Oncology

## 2018-10-17 ENCOUNTER — Other Ambulatory Visit: Payer: Self-pay

## 2018-10-17 ENCOUNTER — Ambulatory Visit: Payer: Medicare Other

## 2018-10-17 DIAGNOSIS — D0511 Intraductal carcinoma in situ of right breast: Secondary | ICD-10-CM | POA: Diagnosis not present

## 2018-10-18 ENCOUNTER — Ambulatory Visit: Payer: Medicare Other

## 2018-10-18 ENCOUNTER — Other Ambulatory Visit: Payer: Self-pay

## 2018-10-18 ENCOUNTER — Ambulatory Visit
Admission: RE | Admit: 2018-10-18 | Discharge: 2018-10-18 | Disposition: A | Discharge status: Home / Self Care | Payer: Medicare Other | Source: Ambulatory Visit | Attending: Radiation Oncology | Admitting: Radiation Oncology

## 2018-10-18 DIAGNOSIS — D0511 Intraductal carcinoma in situ of right breast: Secondary | ICD-10-CM | POA: Diagnosis not present

## 2018-10-19 ENCOUNTER — Ambulatory Visit: Payer: Medicare Other

## 2018-10-19 ENCOUNTER — Other Ambulatory Visit: Payer: Self-pay

## 2018-10-19 ENCOUNTER — Ambulatory Visit: Payer: Medicare Other | Admitting: Oncology

## 2018-10-19 ENCOUNTER — Ambulatory Visit
Admission: RE | Admit: 2018-10-19 | Discharge: 2018-10-19 | Disposition: A | Discharge status: Home / Self Care | Payer: Medicare Other | Source: Ambulatory Visit | Attending: Radiation Oncology | Admitting: Radiation Oncology

## 2018-10-19 DIAGNOSIS — D0511 Intraductal carcinoma in situ of right breast: Secondary | ICD-10-CM | POA: Diagnosis not present

## 2018-10-20 ENCOUNTER — Other Ambulatory Visit: Payer: Self-pay

## 2018-10-20 ENCOUNTER — Ambulatory Visit: Payer: Medicare Other

## 2018-10-20 ENCOUNTER — Ambulatory Visit
Admission: RE | Admit: 2018-10-20 | Discharge: 2018-10-20 | Disposition: A | Discharge status: Home / Self Care | Payer: Medicare Other | Source: Ambulatory Visit | Attending: Radiation Oncology | Admitting: Radiation Oncology

## 2018-10-20 DIAGNOSIS — D0511 Intraductal carcinoma in situ of right breast: Secondary | ICD-10-CM

## 2018-10-20 MED ORDER — RADIAPLEXRX EX GEL
Freq: Once | CUTANEOUS | Status: AC
  Administered 2018-10-20: 16:00:00 via TOPICAL

## 2018-10-20 MED ORDER — ALRA NON-METALLIC DEODORANT (RAD-ONC)
1.0000 "application " | Freq: Once | TOPICAL | Status: AC
  Administered 2018-10-20: 1 via TOPICAL

## 2018-10-20 NOTE — Progress Notes (Signed)
Pt here for patient teaching.  Pt given Radiation and You booklet, skin care instructions, Alra deodorant and Radiaplex gel.  Reviewed areas of pertinence such as fatigue, hair loss, skin changes, breast tenderness and breast swelling . Pt able to give teach back of to pat skin and use unscented/gentle soap,apply Radiaplex bid, avoid applying anything to skin within 4 hours of treatment, avoid wearing an under wire bra and to use an electric razor if they must shave. Pt verbalizes understanding of information given and will contact nursing with any questions or concerns.     Kaitlan Bin M. Salem Mastrogiovanni RN, BSN      

## 2018-10-23 ENCOUNTER — Other Ambulatory Visit: Payer: Self-pay

## 2018-10-23 ENCOUNTER — Ambulatory Visit: Payer: Medicare Other

## 2018-10-23 ENCOUNTER — Ambulatory Visit
Admission: RE | Admit: 2018-10-23 | Discharge: 2018-10-23 | Disposition: A | Discharge status: Home / Self Care | Payer: Medicare Other | Source: Ambulatory Visit | Attending: Radiation Oncology | Admitting: Radiation Oncology

## 2018-10-23 DIAGNOSIS — D0511 Intraductal carcinoma in situ of right breast: Secondary | ICD-10-CM | POA: Diagnosis not present

## 2018-10-24 ENCOUNTER — Other Ambulatory Visit: Payer: Self-pay

## 2018-10-24 ENCOUNTER — Ambulatory Visit: Payer: Medicare Other

## 2018-10-24 ENCOUNTER — Ambulatory Visit
Admission: RE | Admit: 2018-10-24 | Discharge: 2018-10-24 | Disposition: A | Discharge status: Home / Self Care | Payer: Medicare Other | Source: Ambulatory Visit | Attending: Radiation Oncology | Admitting: Radiation Oncology

## 2018-10-24 DIAGNOSIS — D0511 Intraductal carcinoma in situ of right breast: Secondary | ICD-10-CM | POA: Diagnosis not present

## 2018-10-25 ENCOUNTER — Other Ambulatory Visit: Payer: Self-pay

## 2018-10-25 ENCOUNTER — Ambulatory Visit: Payer: Medicare Other

## 2018-10-25 ENCOUNTER — Ambulatory Visit
Admission: RE | Admit: 2018-10-25 | Discharge: 2018-10-25 | Disposition: A | Discharge status: Home / Self Care | Payer: Medicare Other | Source: Ambulatory Visit | Attending: Radiation Oncology | Admitting: Radiation Oncology

## 2018-10-25 DIAGNOSIS — D0511 Intraductal carcinoma in situ of right breast: Secondary | ICD-10-CM | POA: Diagnosis not present

## 2018-10-26 ENCOUNTER — Other Ambulatory Visit: Payer: Self-pay

## 2018-10-26 ENCOUNTER — Ambulatory Visit: Payer: Medicare Other

## 2018-10-26 ENCOUNTER — Ambulatory Visit
Admission: RE | Admit: 2018-10-26 | Discharge: 2018-10-26 | Disposition: A | Discharge status: Home / Self Care | Payer: Medicare Other | Source: Ambulatory Visit | Attending: Radiation Oncology | Admitting: Radiation Oncology

## 2018-10-26 DIAGNOSIS — D0511 Intraductal carcinoma in situ of right breast: Secondary | ICD-10-CM | POA: Diagnosis not present

## 2018-10-27 ENCOUNTER — Ambulatory Visit
Admission: RE | Admit: 2018-10-27 | Discharge: 2018-10-27 | Disposition: A | Discharge status: Home / Self Care | Payer: Medicare Other | Source: Ambulatory Visit | Attending: Radiation Oncology | Admitting: Radiation Oncology

## 2018-10-27 ENCOUNTER — Ambulatory Visit: Payer: Medicare Other

## 2018-10-27 ENCOUNTER — Other Ambulatory Visit: Payer: Self-pay

## 2018-10-27 DIAGNOSIS — D0511 Intraductal carcinoma in situ of right breast: Secondary | ICD-10-CM | POA: Diagnosis not present

## 2018-10-30 ENCOUNTER — Ambulatory Visit: Payer: Medicare Other

## 2018-10-30 ENCOUNTER — Ambulatory Visit
Admission: RE | Admit: 2018-10-30 | Discharge: 2018-10-30 | Disposition: A | Discharge status: Home / Self Care | Payer: Medicare Other | Source: Ambulatory Visit | Attending: Radiation Oncology | Admitting: Radiation Oncology

## 2018-10-30 ENCOUNTER — Other Ambulatory Visit: Payer: Self-pay

## 2018-10-30 DIAGNOSIS — D0511 Intraductal carcinoma in situ of right breast: Secondary | ICD-10-CM | POA: Diagnosis not present

## 2018-10-31 ENCOUNTER — Ambulatory Visit: Payer: Medicare Other

## 2018-10-31 ENCOUNTER — Other Ambulatory Visit: Payer: Self-pay

## 2018-10-31 ENCOUNTER — Ambulatory Visit
Admission: RE | Admit: 2018-10-31 | Discharge: 2018-10-31 | Disposition: A | Discharge status: Home / Self Care | Payer: Medicare Other | Source: Ambulatory Visit | Attending: Radiation Oncology | Admitting: Radiation Oncology

## 2018-10-31 DIAGNOSIS — D0511 Intraductal carcinoma in situ of right breast: Secondary | ICD-10-CM | POA: Diagnosis not present

## 2018-11-01 ENCOUNTER — Other Ambulatory Visit: Payer: Self-pay

## 2018-11-01 ENCOUNTER — Ambulatory Visit
Admission: RE | Admit: 2018-11-01 | Discharge: 2018-11-01 | Disposition: A | Discharge status: Home / Self Care | Payer: Medicare Other | Source: Ambulatory Visit | Attending: Radiation Oncology | Admitting: Radiation Oncology

## 2018-11-01 ENCOUNTER — Ambulatory Visit: Payer: Medicare Other

## 2018-11-01 DIAGNOSIS — D0511 Intraductal carcinoma in situ of right breast: Secondary | ICD-10-CM | POA: Diagnosis not present

## 2018-11-02 ENCOUNTER — Ambulatory Visit
Admission: RE | Admit: 2018-11-02 | Discharge: 2018-11-02 | Disposition: A | Discharge status: Home / Self Care | Payer: Medicare Other | Source: Ambulatory Visit | Attending: Radiation Oncology | Admitting: Radiation Oncology

## 2018-11-02 ENCOUNTER — Ambulatory Visit: Payer: Medicare Other

## 2018-11-02 ENCOUNTER — Other Ambulatory Visit: Payer: Self-pay

## 2018-11-02 DIAGNOSIS — D0511 Intraductal carcinoma in situ of right breast: Secondary | ICD-10-CM | POA: Diagnosis not present

## 2018-11-03 ENCOUNTER — Ambulatory Visit: Payer: Medicare Other | Admitting: Radiation Oncology

## 2018-11-03 ENCOUNTER — Other Ambulatory Visit: Payer: Self-pay

## 2018-11-03 ENCOUNTER — Ambulatory Visit
Admission: RE | Admit: 2018-11-03 | Discharge: 2018-11-03 | Disposition: A | Discharge status: Home / Self Care | Payer: Medicare Other | Source: Ambulatory Visit | Attending: Radiation Oncology | Admitting: Radiation Oncology

## 2018-11-03 ENCOUNTER — Ambulatory Visit: Payer: Medicare Other

## 2018-11-03 DIAGNOSIS — D0511 Intraductal carcinoma in situ of right breast: Secondary | ICD-10-CM | POA: Diagnosis not present

## 2018-11-06 ENCOUNTER — Ambulatory Visit
Admission: RE | Admit: 2018-11-06 | Discharge: 2018-11-06 | Disposition: A | Discharge status: Home / Self Care | Payer: Medicare Other | Source: Ambulatory Visit | Attending: Radiation Oncology | Admitting: Radiation Oncology

## 2018-11-06 ENCOUNTER — Other Ambulatory Visit: Payer: Self-pay

## 2018-11-06 DIAGNOSIS — D0511 Intraductal carcinoma in situ of right breast: Secondary | ICD-10-CM | POA: Diagnosis not present

## 2018-11-07 ENCOUNTER — Other Ambulatory Visit: Payer: Self-pay

## 2018-11-07 ENCOUNTER — Ambulatory Visit
Admission: RE | Admit: 2018-11-07 | Discharge: 2018-11-07 | Disposition: A | Discharge status: Home / Self Care | Payer: Medicare Other | Source: Ambulatory Visit | Attending: Radiation Oncology | Admitting: Radiation Oncology

## 2018-11-07 DIAGNOSIS — D0511 Intraductal carcinoma in situ of right breast: Secondary | ICD-10-CM | POA: Diagnosis not present

## 2018-11-08 ENCOUNTER — Ambulatory Visit
Admission: RE | Admit: 2018-11-08 | Discharge: 2018-11-08 | Disposition: A | Discharge status: Home / Self Care | Payer: Medicare Other | Source: Ambulatory Visit | Attending: Radiation Oncology | Admitting: Radiation Oncology

## 2018-11-08 ENCOUNTER — Other Ambulatory Visit: Payer: Self-pay

## 2018-11-08 DIAGNOSIS — D0511 Intraductal carcinoma in situ of right breast: Secondary | ICD-10-CM | POA: Diagnosis not present

## 2018-11-09 ENCOUNTER — Ambulatory Visit
Admission: RE | Admit: 2018-11-09 | Discharge: 2018-11-09 | Disposition: A | Discharge status: Home / Self Care | Payer: Medicare Other | Source: Ambulatory Visit | Attending: Radiation Oncology | Admitting: Radiation Oncology

## 2018-11-09 ENCOUNTER — Other Ambulatory Visit: Payer: Self-pay

## 2018-11-09 DIAGNOSIS — D0511 Intraductal carcinoma in situ of right breast: Secondary | ICD-10-CM | POA: Diagnosis not present

## 2018-11-10 ENCOUNTER — Encounter: Payer: Self-pay | Admitting: Radiation Oncology

## 2018-11-10 ENCOUNTER — Other Ambulatory Visit: Payer: Self-pay

## 2018-11-10 ENCOUNTER — Encounter: Payer: Self-pay | Admitting: *Deleted

## 2018-11-10 ENCOUNTER — Ambulatory Visit
Admission: RE | Admit: 2018-11-10 | Discharge: 2018-11-10 | Disposition: A | Discharge status: Home / Self Care | Payer: Medicare Other | Source: Ambulatory Visit | Attending: Radiation Oncology | Admitting: Radiation Oncology

## 2018-11-10 DIAGNOSIS — D0511 Intraductal carcinoma in situ of right breast: Secondary | ICD-10-CM | POA: Diagnosis not present

## 2018-11-22 ENCOUNTER — Telehealth: Payer: Self-pay | Admitting: Oncology

## 2018-11-22 NOTE — Telephone Encounter (Signed)
Called patient regarding upcoming Webex appointment, screen run complete and e-mail has been sent.

## 2018-11-23 NOTE — Progress Notes (Signed)
[place]  [phone] [phone]     ID: Christina Hernandez DOB: Nov 05, 1950  MR#: [id number]  OBS#:[id number]  Patient Care Team: Glendale Chard, MD as PCP - General (Internal Medicine) [name], MD as PCP - Cardiology (Cardiology) [name], MD as Consulting Physician (Obstetrics and Gynecology) [name], MD as Consulting Physician (General Surgery) Elia Nunley, [name], MD as Consulting Physician (Oncology) [name], MD as Consulting Physician (Radiation Oncology) [name], Earlean Polka, MD as Consulting Physician (Neurology) [name], MD as Consulting Physician (Pulmonary Disease) Chauncey Cruel, MD OTHER MD:  I connected with Christina Hernandez on 11/27/18 at 10:00 AM EDT by video enabled telemedicine visit and verified that I am speaking with the correct person using two identifiers.   I discussed the limitations, risks, security and privacy concerns of performing an evaluation and management service by telemedicine and the availability of in-person appointments. I also discussed with the patient that there may be a patient responsible charge related to this service. The patient expressed understanding and agreed to proceed.   Other persons participating in the visit and their role in the encounter: Wilburn Mylar, scribe   Patient's location: home  Provider's location: [place]    CHIEF COMPLAINT: Estrogen receptor positive breast cancer  CURRENT TREATMENT: Observation   INTERVAL HISTORY: Christina Hernandez is seen today for follow up of her estrogen receptor positive breast cancer.  Since her last visit, she underwent genetic testing on 08/21/2018. Results from this were negative.  She proceeded to right breast lumpectomy on 09/01/2018 under Dr. Lucia Gaskins. Pathology from the procedure ([id number]) revealed: ductal carcinoma in situ, focal residual (0.05 cm), margins of resection not involved.   She then underwent radiation therapy from 10/16/2018 - 11/10/2018. She tolerated this well. She reports skin darkening, which is improved with the cream she was given. She also reports some lingering fatigue.   REVIEW OF SYSTEMS: Christina Hernandez reports doing well overall. She is at home with her husband, son, and foster son. She has not been exercising. 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 The patient denies unusual headaches, visual changes, nausea, vomiting, stiff neck, dizziness, or gait imbalance. There has been no cough, phlegm production, or pleurisy, no chest pain or pressure, and no change in bowel or bladder habits. The patient denies fever, rash, bleeding, unexplained fatigue or unexplained weight loss. A detailed review of systems was otherwise entirely negative.   HISTORY OF CURRENT ILLNESS: Christina Hernandez had routine screening mammography on 07/10/2018 showing a possible abnormality in the right breast. She underwent bilateral diagnostic mammography with tomography and right breast ultrasonography at [facility] on 07/21/2018 showing: indeterminate mass at the 3:30 position of the right breast measuring 3 x 4 x 4 mm; normal right axilla.  Accordingly on 07/24/2018 she proceeded to biopsy of the right breast area in question. The pathology ([id number]) from this procedure showed: ductal carcinoma in situ, low grade, may be involving an underlying papillary lesion. Prognostic indicators significant for: estrogen receptor, 100% positive and progesterone receptor, 100% positive or negative, both with strong staining intensity.   The patient's subsequent history is as detailed below.   PAST MEDICAL HISTORY: Past Medical History:  Diagnosis Date  . Arthritis   . Breast cancer, right breast [facility])    pending right breast lumpectomy on September 01, 2018  . Diabetes mellitus type II, controlled ([place])    On Kombiglyze  . Family history of ovarian cancer   . Family  history of thyroid  cancer   . GERD (gastroesophageal reflux disease)    rarely occurs- no meds  . Hyperlipidemia associated with type 2 diabetes mellitus ([place])   . Hypertension   . Hypothyroidism   . Trigeminal neuralgia of left side of face 2015   s/p Gamma knife  She has a stable lung nodule   PAST SURGICAL HISTORY: Past Surgical History:  Procedure Laterality Date  . BREAST LUMPECTOMY WITH RADIOACTIVE SEED LOCALIZATION Right 09/01/2018   Procedure: RIGHT BREAST LUMPECTOMY WITH RADIOACTIVE SEED LOCALIZATION;  Surgeon: [name], MD;  Location: [facility];  Service: General;  Laterality: Right;  . COLONOSCOPY    . Gamma Knife Left 04/08/2016  . HYSTEROSCOPY W/D&C  05/24/2011   Procedure: DILATATION AND CURETTAGE (D&C) /HYSTEROSCOPY;  Surgeon: [name], MD;  Location: [facility];  Service: Gynecology;  Laterality: N/A;  . KNEE SURGERY Right 06/10/15   torn ligament  . THYROIDECTOMY N/A 09/08/2016   Procedure: TOTAL THYROIDECTOMY;  Surgeon: [name], MD;  Location: [facility];  Service: ENT;  Laterality: N/A;  . TUBAL LIGATION    . uterine polyp    . WISDOM TOOTH EXTRACTION Left 2014    FAMILY HISTORY Family History  Problem Relation Age of Onset  . Hypertension Mother   . Kidney failure Mother   . Congestive Heart Failure Mother   . Heart Problems Father   . Stroke Father   . Pneumonia Father   . Heart Problems Sister 40       heart transplant  . Stroke Paternal Grandmother   . Dementia Paternal Grandmother   . Stroke Paternal Grandfather   . Cardiomyopathy Sister   . Ovarian cancer Sister 31       d. 91  . Thyroid disease Sister   . Thyroid disease Sister   . Thyroid cancer Niece 55  . Breast cancer Neg Hx   (as of 08/02/2018) Patient father was 29 years old when he died from pneumonia and a stroke. Patient mother died from possible heart attack at age 74.  The patient denies a family hx of breast cancer. A sister was diagnosed with ovarian cancer, and has since passed away  at age 50.  The patient has 5 siblings, 5 sisters and 0 brothers.   GYNECOLOGIC HISTORY:  No LMP recorded. Patient is postmenopausal. Menarche: 68 years old Age at first live birth: 68 years old [place] P 4 LMP 1996 Contraceptive yes HRT no  Hysterectomy? Yes, 5-6 years ago (per patient) BSO? 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 Husband [name] is a retired English as a second language teacher. [name], age 73, is a NP for an opioid clinic in [place]. Son [name], age 82, is a Nature conservation officer in [place]. Daughter [name], age 77, works as a Software engineer for [employer]. Son [name], age 76, works in Land in [place].  The patient has no grandchildren.  She belongs to [employer], and she is Information systems manager.    ADVANCED DIRECTIVES: Husband [name] is her HCPOA.   HEALTH MAINTENANCE: Social History   Tobacco Use  . Smoking status: Never Smoker  . Smokeless tobacco: Never Used  Substance Use Topics  . Alcohol use: No  . Drug use: No     Colonoscopy: 2019, Dr. Collene Mares  PAP: 03/21/2011, normal  Bone density: unknown   Allergies  Allergen Reactions  . Cyclobenzaprine Nausea Only    Current Outpatient Medications  Medication Sig Dispense Refill  . acetaminophen (  TYLENOL) 500 MG tablet Take 1 tablet (500 mg total) by mouth every 8 (eight) hours as needed. 30 tablet 1  . cetirizine (ZYRTEC) 10 MG tablet Take 1 tablet (10 mg total) by mouth daily. (Patient taking differently: Take 10 mg by mouth daily as needed for allergies. ) 30 tablet 3  . DULoxetine (CYMBALTA) 60 MG capsule Take 1 capsule (60 mg total) by mouth daily. 90 capsule 4  . gabapentin (NEURONTIN) 300 MG capsule Take 3 capsules (900 mg total) by mouth 3 (three) times daily. 810 capsule 4  . HYDROcodone-acetaminophen (NORCO/VICODIN) 5-325 MG tablet Take 1 tablet by mouth every 6 (six) hours as needed for moderate pain. 12 tablet 0  . ibuprofen (ADVIL,MOTRIN) 400 MG tablet Take by mouth.    .  losartan-hydrochlorothiazide (HYZAAR) 50-12.5 MG tablet TAKE 1 TABLET BY ORAL ROUTE EVERY DAY 90 tablet 1  . NONFORMULARY OR COMPOUNDED ITEM Compounded cream for right knee as needed    . pravastatin (PRAVACHOL) 40 MG tablet Take 40 mg by mouth daily.    . Saxagliptin-Metformin (KOMBIGLYZE XR) 5-500 MG TB24 Take 1 tablet by mouth daily before supper. 90 tablet 1  . SYNTHROID 75 MCG tablet TAKE 1 TABLET DAILY MON-SAT, HOLD ON SUNDAY. 26 tablet 2  . traMADol (ULTRAM) 50 MG tablet Take 1 tablet (50 mg total) by mouth every 8 (eight) hours as needed. 20 tablet 0   No current facility-administered medications for this visit.     OBJECTIVE: Middle-aged African-American woman who appears stated age  There were no vitals filed for this visit.   There is no height or weight on file to calculate BMI.   Wt Readings from Last 3 Encounters:  09/01/18 176 lb 2.4 oz (79.9 kg)  08/24/18 177 lb 6.4 oz (80.5 kg)  08/10/18 176 lb 6.4 oz (80 kg)      ECOG FS:1 - Symptomatic but completely ambulatory   LAB RESULTS:  CMP     Component Value Date/Time   NA 141 08/10/2018 1356   K 3.7 08/10/2018 1356   CL 102 08/10/2018 1356   CO2 23 08/10/2018 1356   GLUCOSE 78 08/10/2018 1356   GLUCOSE 109 (H) 08/02/2018 0835   BUN 10 08/10/2018 1356   CREATININE 0.66 08/10/2018 1356   CREATININE 0.77 08/02/2018 0835   CALCIUM 9.0 08/10/2018 1356   PROT 6.6 08/10/2018 1356   ALBUMIN 4.1 08/10/2018 1356   AST 23 08/10/2018 1356   AST 18 08/02/2018 0835   ALT 23 08/10/2018 1356   ALT 22 08/02/2018 0835   ALKPHOS 102 08/10/2018 1356   BILITOT 0.5 08/10/2018 1356   BILITOT 0.7 08/02/2018 0835   GFRNONAA 90 08/10/2018 1356   GFRNONAA >60 08/02/2018 0835   GFRAA 104 08/10/2018 1356   GFRAA >60 08/02/2018 0835    No results found for: TOTALPROTELP, ALBUMINELP, A1GS, A2GS, BETS, BETA2SER, GAMS, MSPIKE, SPEI  No results found for: KPAFRELGTCHN, LAMBDASER, KAPLAMBRATIO  Lab Results  Component Value Date    WBC 4.8 08/10/2018   NEUTROABS 2.8 08/02/2018   HGB 11.6 08/10/2018   HCT 34.9 08/10/2018   MCV 81 08/10/2018   PLT 242 08/10/2018    '@LASTCHEMISTRY' @  No results found for: LABCA2  No components found for: NIOEVO350  No results for input(s): INR in the last 168 hours.  No results found for: LABCA2  No results found for: KXF818  No results found for: EXH371  No results found for: IRC789  No results found for: CA2729  No  components found for: HGQUANT  No results found for: CEA1 / No results found for: CEA1   No results found for: AFPTUMOR  No results found for: CHROMOGRNA  No results found for: PSA1  No visits with results within 3 Day(s) from this visit.  Latest known visit with results is:  Admission on 09/01/2018, Discharged on 09/01/2018  Component Date Value Ref Range Status  . Glucose-Capillary 09/01/2018 75  70 - 99 mg/dL Final  . Glucose-Capillary 09/01/2018 86  70 - 99 mg/dL Final    (this displays the last labs from the last 3 days)  No results found for: TOTALPROTELP, ALBUMINELP, A1GS, A2GS, BETS, BETA2SER, GAMS, MSPIKE, SPEI (this displays SPEP labs)  No results found for: KPAFRELGTCHN, LAMBDASER, KAPLAMBRATIO (kappa/lambda light chains)  No results found for: HGBA, HGBA2QUANT, HGBFQUANT, HGBSQUAN (Hemoglobinopathy evaluation)   No results found for: LDH  No results found for: IRON, TIBC, IRONPCTSAT (Iron and TIBC)  No results found for: FERRITIN  Urinalysis    Component Value Date/Time   COLORURINE YELLOW 08/17/2007 1404   APPEARANCEUR CLEAR 08/17/2007 1404   LABSPEC 1.010 08/17/2007 1404   PHURINE 7.0 08/17/2007 1404   GLUCOSEU NEGATIVE 08/17/2007 1404   HGBUR NEGATIVE 08/17/2007 1404   BILIRUBINUR negative 05/10/2018 1221   KETONESUR negative 04/12/2016 1147   KETONESUR NEGATIVE 08/17/2007 1404   PROTEINUR Negative 05/10/2018 [place] 08/17/2007 1404   UROBILINOGEN 0.2 05/10/2018 1221   UROBILINOGEN 0.2  08/17/2007 1404   NITRITE negative 05/10/2018 1221   NITRITE NEGATIVE 08/17/2007 1404   LEUKOCYTESUR Negative 05/10/2018 1221     STUDIES: No results found.  ELIGIBLE FOR AVAILABLE RESEARCH PROTOCOL: no  ASSESSMENT: 68 y.o. [place] woman status post right breast biopsy 07/24/2018 for ductal carcinoma in situ, grade 1, strongly estrogen and progesterone receptor positive.  (1) status post right lumpectomy 09/01/2018 for ductal carcinoma in situ, 5 mm, with negative margins  (2) adjuvant radiation 10/16/2018 - 11/10/2018  (a) Right Breast was treated to 42.56 Gy in 16 fractions, followed by a boost of 8 Gy in 4 fractions.  (3) consider antiestrogens at the completion of local treatment.  (4) genetics testing on 08/21/2018  (a) genetic testing 08/27/2018 on the STAT panel and the common hereditary cancer gene paneloffered by [facility] found no deleterious mutations in APC, ATM, AXIN2, BARD1, BMPR1A, BRCA1, BRCA2, BRIP1, CDH1, CDK4, CDKN2A (p14ARF), CDKN2A (p16INK4a), CHEK2, CTNNA1, DICER1, EPCAM (Deletion/duplication testing only), GREM1 (promoter region deletion/duplication testing only), KIT, MEN1, MLH1, MSH2, MSH3, MSH6, MUTYH, NBN, NF1, NHTL1, PALB2, PDGFRA, PMS2, POLD1, POLE, PTEN, RAD50, RAD51C, RAD51D, SDHB, SDHC, SDHD, SMAD4, SMARCA4. STK11, TP53, TSC1, TSC2, and VHL.  The following genes were evaluated for sequence changes only: SDHA and HOXB13 c.251G>A variant only.     PLAN: Carlus Pavlov has completed local treatment for her ductal carcinoma in situ, namely surgery and radiation.  She has done very well with these treatments.  She is still recovering from the radiation, with some hyperpigmentation and some fatigue remaining  We discussed in a preliminary fashion of the possible use of antiestrogens chiefly for breast cancer prevention.  Right now I would not start her on these medications.  She has not regained her full functional status.  However I would expect by October she will be  ready to discuss this in more detail and I will see her at that time.  She will need a bone density if she decides to go with aromatase inhibitors, but not so much if she decides to  go with tamoxifen  I encouraged her to do more walking, and become more active, which will help her recover more quickly from the radiation side effects.  She is taking appropriate pandemic precautions.  She knows to call for any other issue that may develop before the next visit.   Chauncey Cruel, MD   11/27/2018 10:11 AM Medical Oncology and Hematology [facility] [address] [place], [place] [address] Tel. [phone]    Fax. [phone]   This document serves as a record of services personally performed by Lurline Del, MD. It was created on his behalf by Wilburn Mylar, a trained medical scribe. The creation of this record is based on the scribe's personal observations and the provider's statements to them.   I, Lurline Del MD, have reviewed the above documentation for accuracy and completeness, and I agree with the above.

## 2018-11-27 ENCOUNTER — Inpatient Hospital Stay: Payer: Medicare Other | Attending: Oncology | Admitting: Oncology

## 2018-11-27 DIAGNOSIS — E119 Type 2 diabetes mellitus without complications: Secondary | ICD-10-CM

## 2018-11-27 DIAGNOSIS — Z8041 Family history of malignant neoplasm of ovary: Secondary | ICD-10-CM

## 2018-11-27 DIAGNOSIS — Z79899 Other long term (current) drug therapy: Secondary | ICD-10-CM

## 2018-11-27 DIAGNOSIS — D0511 Intraductal carcinoma in situ of right breast: Secondary | ICD-10-CM | POA: Diagnosis not present

## 2018-11-27 DIAGNOSIS — I10 Essential (primary) hypertension: Secondary | ICD-10-CM

## 2018-11-27 DIAGNOSIS — Z170 Estrogen receptor positive status [ER+]: Secondary | ICD-10-CM | POA: Diagnosis not present

## 2018-11-27 DIAGNOSIS — Z808 Family history of malignant neoplasm of other organs or systems: Secondary | ICD-10-CM

## 2018-11-27 DIAGNOSIS — E039 Hypothyroidism, unspecified: Secondary | ICD-10-CM

## 2018-11-28 ENCOUNTER — Telehealth: Payer: Self-pay | Admitting: Oncology

## 2018-11-28 NOTE — Telephone Encounter (Signed)
I could not reach patient °

## 2018-12-01 ENCOUNTER — Telehealth: Payer: Self-pay | Admitting: Adult Health

## 2018-12-01 NOTE — Telephone Encounter (Signed)
Scheduled appt per sch msg. Mailed printout  °

## 2018-12-05 ENCOUNTER — Telehealth: Payer: Self-pay

## 2018-12-05 ENCOUNTER — Other Ambulatory Visit: Payer: Self-pay | Admitting: Internal Medicine

## 2018-12-05 NOTE — Telephone Encounter (Signed)
Called pt to get a consent for her appt on June 11 no answer and no vm setup

## 2018-12-06 ENCOUNTER — Telehealth: Payer: Self-pay | Admitting: Radiation Oncology

## 2018-12-06 NOTE — Telephone Encounter (Signed)
  Radiation Oncology         [phone] ________________________________  Name: Christina Hernandez MRN: [id number]  Date of Service: 12/06/2018  DOB: 01-Jun-1951  Post Treatment Telephone Note  Diagnosis:    ER/PR positive, low grade DCIS of the right breast.  Interval Since Last Radiation:  4 weeks   10/16/2018-11/10/2018:  The patient received 42.56 Gy to the right breast over 16 fractions, followed by 8 Gy boost to the lumpectomy cavity over 4 fractions  Narrative:  The patient was contacted today for routine follow-up. During treatment she did very well with radiotherapy and did not have significant desquamation.   Impression/Plan: 1.  ER/PR positive, low grade DCIS of the right breast. The patient has been doing well since completion of radiotherapy per Dr. [name] notes. I left a messagge today asking the patient to return my call so we can discuss skin care, long term follow up with medical oncology, and considerations to avoid sun exposure to the previously treated area.     Carola Rhine, PAC

## 2018-12-07 ENCOUNTER — Telehealth: Payer: Self-pay | Admitting: *Deleted

## 2018-12-07 ENCOUNTER — Telehealth (INDEPENDENT_AMBULATORY_CARE_PROVIDER_SITE_OTHER): Payer: Medicare Other | Admitting: Cardiology

## 2018-12-07 VITALS — Ht 65.0 in | Wt 175.0 lb

## 2018-12-07 DIAGNOSIS — E1165 Type 2 diabetes mellitus with hyperglycemia: Secondary | ICD-10-CM

## 2018-12-07 DIAGNOSIS — R9431 Abnormal electrocardiogram [ECG] [EKG]: Secondary | ICD-10-CM

## 2018-12-07 DIAGNOSIS — I10 Essential (primary) hypertension: Secondary | ICD-10-CM | POA: Diagnosis not present

## 2018-12-07 DIAGNOSIS — E7800 Pure hypercholesterolemia, unspecified: Secondary | ICD-10-CM

## 2018-12-07 NOTE — Telephone Encounter (Signed)
After many attempts, Spoke with patient,  instruction given for today's virtual appt 12/07/18 at 10 am . Patient verbalized understanding.

## 2018-12-07 NOTE — Progress Notes (Signed)
Virtual Visit via Video Note   This visit type was conducted due to national recommendations for restrictions regarding the COVID-19 Pandemic (e.g. social distancing) in an effort to limit this patient's exposure and mitigate transmission in our community.  Due to her co-morbid illnesses, this patient is at least at moderate risk for complications without adequate follow up.  This format is felt to be most appropriate for this patient at this time.  All issues noted in this document were discussed and addressed.  A limited physical exam was performed with this format.  Please refer to the patient's chart for her consent to telehealth for [facility].   Initial attempts at video calling were very difficult.  We had about 5 minutes of good connectivity, and then completed the call using just simple telephone conversation.  This did allow for brief visual examination.  Patient has given verbal permission to conduct this visit via virtual appointment and to bill insurance 12/09/2018 6:17 PM     Evaluation Performed:  Follow-up visit  Date:  12/09/2018   ID:  Christina Hernandez, DOB 1951/02/18, MRN [id number]  Patient Location: Home Provider Location: Home  PCP:  Glendale Chard, MD  Cardiologist:  Glenetta Hew, MD  Electrophysiologist:  None   Chief Complaint: Postop follow-up to discuss baseline cardiac risk  History of Present Illness:    Christina Hernandez is a 68 y.o. female with PMH notable for recent lumpectomy for breast cancer as well as diabetes, hypertension and hyperlipidemia with family history of CAD who presents via audio/video conferencing for a telehealth visit today.  Alvira Keymora Grillot was first seen back in February for preop evaluation based on nonspecific EKG changes and left shoulder/arm pain.  This was prior to her having breast cancer surgery.  I recommended that she proceed with surgery without other evaluation.  She wanted to discuss baseline  cardiovascular risk but decided to wait until after her surgery.  Interval History:  Surgery went well - minimal pain.  XRT x 20 doses.  Just a little tired. Supposed to be back walking - but hasn't really gotten started.  Is thinking about using her TM @ home.   Plan to start hormone therapy with Oncologist in Oct.   Not really having the arm/shoulder pain.   Doing well from CV standpoint: Cardiovascular ROS: no chest pain or dyspnea on exertion negative for - edema, irregular heartbeat, loss of consciousness, orthopnea, palpitations, paroxysmal nocturnal dyspnea, rapid heart rate or shortness of breath, TIA/Amaurosis [place].   ROS:  Please see the history of present illness.   The patient does not have symptoms concerning for COVID-19 infection (fever, chills, cough, or new shortness of breath).  The patient is practicing social distancing.    No nausea, vomiting or diarrhea.  No dysuria or hematuria. --Noted abdominal pain and bilateral knee pain.  Osteoarthritis limits her ability to walk more than a few blocks.  Trivial ankle edema.  Past Medical History:  Diagnosis Date  . Arthritis   . Breast cancer, right breast [facility])    pending right breast lumpectomy on September 01, 2018  . Diabetes mellitus type II, controlled ([place])    On Kombiglyze  . Family history of ovarian cancer   . Family history of thyroid cancer   . GERD (gastroesophageal reflux disease)    rarely occurs- no meds  . Hyperlipidemia associated with type 2 diabetes mellitus ([place])   . Hypertension   . Hypothyroidism   . Trigeminal neuralgia of  left side of face 2015   s/p Gamma knife   Past Surgical History:  Procedure Laterality Date  . BREAST LUMPECTOMY WITH RADIOACTIVE SEED LOCALIZATION Right 09/01/2018   Procedure: RIGHT BREAST LUMPECTOMY WITH RADIOACTIVE SEED LOCALIZATION;  Surgeon: [name], MD;  Location: [place];  Service: General;  Laterality: Right;  . COLONOSCOPY    . Gamma Knife  Left 04/08/2016  . HYSTEROSCOPY W/D&C  05/24/2011   Procedure: DILATATION AND CURETTAGE (D&C) /HYSTEROSCOPY;  Surgeon: [name], MD;  Location: [facility];  Service: Gynecology;  Laterality: N/A;  . KNEE SURGERY Right 06/10/15   torn ligament  . THYROIDECTOMY N/A 09/08/2016   Procedure: TOTAL THYROIDECTOMY;  Surgeon: [name], MD;  Location: [place];  Service: ENT;  Laterality: N/A;  . TUBAL LIGATION    . uterine polyp    . WISDOM TOOTH EXTRACTION Left 2014     Current Meds  Medication Sig  . acetaminophen (TYLENOL) 500 MG tablet Take 1 tablet (500 mg total) by mouth every 8 (eight) hours as needed.  Marland Kitchen BIOTIN MAXIMUM PO Take by mouth.  . cetirizine (ZYRTEC) 10 MG tablet Take 1 tablet (10 mg total) by mouth daily. (Patient taking differently: Take 10 mg by mouth daily as needed for allergies. )  . Cholecalciferol (D3 ADULT PO) Take by mouth.  . DULoxetine (CYMBALTA) 60 MG capsule Take 1 capsule (60 mg total) by mouth daily.  Marland Kitchen gabapentin (NEURONTIN) 300 MG capsule Take 3 capsules (900 mg total) by mouth 3 (three) times daily.  Marland Kitchen HYDROcodone-acetaminophen (NORCO/VICODIN) 5-325 MG tablet Take 1 tablet by mouth every 6 (six) hours as needed for moderate pain.  Marland Kitchen ibuprofen (ADVIL,MOTRIN) 400 MG tablet Take by mouth.  . losartan-hydrochlorothiazide (HYZAAR) 50-12.5 MG tablet TAKE 1 TABLET BY ORAL ROUTE EVERY DAY  . NONFORMULARY OR COMPOUNDED ITEM Compounded cream for right knee as needed  . pravastatin (PRAVACHOL) 40 MG tablet Take 40 mg by mouth daily.  . Saxagliptin-Metformin (KOMBIGLYZE XR) 5-500 MG TB24 Take 1 tablet by mouth daily before supper.  Marland Kitchen SYNTHROID 75 MCG tablet TAKE 1 TABLET DAILY MON-SAT, HOLD ON SUNDAY.  . traMADol (ULTRAM) 50 MG tablet Take 1 tablet (50 mg total) by mouth every 8 (eight) hours as needed.     Allergies:   Cyclobenzaprine   Social History   Tobacco Use  . Smoking status: Never Smoker  . Smokeless tobacco: Never Used  Substance Use Topics  . Alcohol use:  No  . Drug use: No     Family Hx: The patient's family history includes Cardiomyopathy in her sister; Congestive Heart Failure in her mother; Dementia in her paternal grandmother; Heart Problems in her father; Heart Problems (age of onset: 82) in her sister; Hypertension in her mother; Kidney failure in her mother; Ovarian cancer (age of onset: 18) in her sister; Pneumonia in her father; Stroke in her father, paternal grandfather, and paternal grandmother; Thyroid cancer (age of onset: 29) in her niece; Thyroid disease in her sister and sister. There is no history of Breast cancer.   Prior CV studies:   The following studies were reviewed today: . none  Labs/Other Tests and Data Reviewed:    EKG:  No ECG reviewed.  Recent Labs: 05/10/2018: Magnesium 2.1; TSH 1.710 08/10/2018: ALT 23; BUN 10; Creatinine, Ser 0.66; Hemoglobin 11.6; Platelets 242; Potassium 3.7; Sodium 141   Recent Lipid Panel Lab Results  Component Value Date/Time   CHOL 183 05/10/2018 11:56 AM   TRIG 134 05/10/2018 11:56  AM   HDL 53 05/10/2018 11:56 AM   CHOLHDL 3.5 05/10/2018 11:56 AM   LDLCALC 103 (H) 05/10/2018 11:56 AM    Wt Readings from Last 3 Encounters:  12/07/18 175 lb (79.4 kg)  09/01/18 176 lb 2.4 oz (79.9 kg)  08/24/18 177 lb 6.4 oz (80.5 kg)     Objective:    Vital Signs:  Ht 5\' 5"  (1.651 m)   Wt 175 lb (79.4 kg)   BMI 29.12 kg/m   GEN:  no acute distress RESPIRATORY:  non-labored NEURO:  alert and oriented x 3, no obvious focal deficit PSYCH:  normal affect   ASSESSMENT & PLAN:    Problem List Items Addressed This Visit    Uncontrolled type 2 diabetes mellitus with hyperglycemia (HCC) (Chronic)    Probably her biggest cardiac risk factor is her diabetes.  She has relatively controlled lipids and blood pressure.  With these standard risk factors, and cardiac disease in her mother and sister as well as father, she would like to get a baseline cardiovascular risk assessment. In the  absence of any heart failure symptoms I will see any reason for checking an echocardiogram, and without symptoms no real indication for stress test.  I do not think a treadmill stress test will be helpful at all.  I do think some baseline recertification is warranted and we discussed various options, but it would seem to the most feasible test would be coronary calcium score which allow [place] to quantify potential non-symptomatic CAD in order to adjust our management reference blood pressure and lipid control.  Plan: Coronary Calcium Score.  We will order this test to be done shortly before her 68-month follow-up -> she would like some time just to avoid any further testing and get done with her cancer surgery/treatment.      Pure hypercholesterolemia (Chronic)    LDL is at least close to 100 and on current dose of pravastatin.  Further treatment guidelines based on coronary calcium score result.      Nonspecific abnormal electrocardiogram (ECG) (EKG) - Primary (Chronic)    Nonspecific changes on EKG without any active symptoms of CAD still works on reasonable evaluation.  I will see that stress testing will help [place] because it is negative does not exclude the presence of coronary disease. Plan: Coronary calcium score CT to be done prior to 29-month follow-up.      Essential hypertension (Chronic)    She has had relatively well-controlled blood pressure and is on an ARB appropriately with diabetes.         COVID-19 Education: The signs and symptoms of COVID-19 were discussed with the patient and how to seek care for testing (follow up with PCP or arrange E-visit).   The importance of social distancing was discussed today.  Time:   Today, I have spent 18-20 minutes with the patient with telehealth technology discussing the above problems.   I did spend quite a bit of time talking about the benefits of cardiovascular evaluation with the options of stress tests (explaining GXT and nuclear  stress test) as well as coronary calcium score CT, and coronary CT angiogram.  I did explain to her that the Coronary Calcium Score CT may or may not be covered by her insurance company and that it would likely be an out-of-pocket cost.  She agrees to proceed.   Medication Adjustments/Labs and Tests Ordered: Current medicines are reviewed at length with the patient today.  Concerns regarding medicines are outlined above.  Medication Instructions:  none  Tests Ordered: No orders of the defined types were placed in this encounter.  --The patient will consider +/- doing Coronary Calcium Score for baseline risk evaluation, but would like to wait a while.  She will contact our office to determine if she would like to have it done prior to follow-up  Medication Changes: No orders of the defined types were placed in this encounter.   Disposition:  Follow up In roughly February 2021 timeframe    Signed, Glenetta Hew, MD  12/09/2018 6:17 PM    [place]

## 2018-12-07 NOTE — Patient Instructions (Addendum)
Medication Instructions:  NO CHANGES  If you need a refill on your cardiac medications before your next appointment, please call your pharmacy.   Lab work:    NOT NEEDED  Testing/Procedures:  -The patient will consider +/- doing Coronary Calcium Score for baseline risk evaluation, but would like to wait a while.  Please  contact our office to determine if you would like to have it done prior to follow-up   Here is information about calcium score:   This test is done at [address]. This is $150 out of pocket,  insurance does not cover this test.. Coronary CalciumScan A coronary calcium scan is an imaging test used to look for deposits of calcium and other fatty materials (plaques) in the inner lining of the blood vessels of the heart (coronary arteries). These deposits of calcium and plaques can partly clog and narrow the coronary arteries without producing any symptoms or warning signs. This puts a person at risk for a heart attack. This test can detect these deposits before symptoms develop. Tell a health care provider about:  Any allergies you have.  All medicines you are taking, including vitamins, herbs, eye drops, creams, and over-the-counter medicines.  Any problems you or family members have had with anesthetic medicines.  Any blood disorders you have.  Any surgeries you have had.  Any medical conditions you have.  Whether you are pregnant or may be pregnant. What are the risks? Generally, this is a safe procedure. However, problems may occur, including:  Harm to a pregnant woman and her unborn baby. This test involves the use of radiation. Radiation exposure can be dangerous to a pregnant woman and her unborn baby. If you are pregnant, you generally should not have this procedure done.  Slight increase in the risk of cancer. This is because of the radiation involved in the test. What happens before the procedure? No preparation is needed for this  procedure. What happens during the procedure?  You will undress and remove any jewelry around your neck or chest.  You will put on a hospital gown.  Sticky electrodes will be placed on your chest. The electrodes will be connected to an electrocardiogram (ECG) machine to record a tracing of the electrical activity of your heart.  A CT scanner will take pictures of your heart. During this time, you will be asked to lie still and hold your breath for 2-3 seconds while a picture of your heart is being taken. The procedure may vary among health care providers and hospitals. What happens after the procedure?  You can get dressed.  You can return to your normal activities.  It is up to you to get the results of your test. Ask your health care provider, or the department that is doing the test, when your results will be ready. Summary  A coronary calcium scan is an imaging test used to look for deposits of calcium and other fatty materials (plaques) in the inner lining of the blood vessels of the heart (coronary arteries).  Generally, this is a safe procedure. Tell your health care provider if you are pregnant or may be pregnant.  No preparation is needed for this procedure.  A CT scanner will take pictures of your heart.  You can return to your normal activities after the scan is done. This information is not intended to replace advice given to you by your health care provider. Make sure you discuss any questions you have with your  health care provider. Document Released: 12/11/2007 Document Revised: 05/03/2016 Document Reviewed: 05/03/2016 Elsevier Interactive Patient Education  2017 [place]: At [facility], you and your health needs are our priority.  As part of our continuing mission to provide you with exceptional heart care, we have created designated Provider Care Teams.  These Care Teams include your primary Cardiologist (physician) and Advanced Practice  Providers (APPs -  Physician Assistants and Nurse Practitioners) who all work together to provide you with the care you need, when you need it. . You will need a follow up appointment in  8  Months FEB 2021.  Please call our office 2 months in advance to schedule this appointment.  You may see Glenetta Hew, MD or one of the following Advanced Practice Providers on your designated Care Team:   . Rosaria Ferries, PA-C . Jory Sims, DNP, ANP  Any Other Special Instructions Will Be Listed Below.

## 2018-12-07 NOTE — Telephone Encounter (Signed)
Spoke with patient, instruction given from tel- visit 6/11 with dr harding.  patient  aware she will call back if she decide to have calcium score scan prior to next visit. avs summary will be sent via mychart. Patient voiced understanding

## 2018-12-09 ENCOUNTER — Encounter: Payer: Self-pay | Admitting: Cardiology

## 2018-12-09 NOTE — Assessment & Plan Note (Signed)
She has had relatively well-controlled blood pressure and is on an ARB appropriately with diabetes.

## 2018-12-09 NOTE — Assessment & Plan Note (Signed)
Nonspecific changes on EKG without any active symptoms of CAD still works on reasonable evaluation.  I will see that stress testing will help [place] because it is negative does not exclude the presence of coronary disease. Plan: Coronary calcium score CT to be done prior to 30-month follow-up.

## 2018-12-09 NOTE — Assessment & Plan Note (Signed)
LDL is at least close to 100 and on current dose of pravastatin.  Further treatment guidelines based on coronary calcium score result.

## 2018-12-09 NOTE — Assessment & Plan Note (Signed)
Probably her biggest cardiac risk factor is her diabetes.  She has relatively controlled lipids and blood pressure.  With these standard risk factors, and cardiac disease in her mother and sister as well as father, she would like to get a baseline cardiovascular risk assessment. In the absence of any heart failure symptoms I will see any reason for checking an echocardiogram, and without symptoms no real indication for stress test.  I do not think a treadmill stress test will be helpful at all.  I do think some baseline recertification is warranted and we discussed various options, but it would seem to the most feasible test would be coronary calcium score which allow [place] to quantify potential non-symptomatic CAD in order to adjust our management reference blood pressure and lipid control.  Plan: Coronary Calcium Score.  We will order this test to be done shortly before her 73-month follow-up -> she would like some time just to avoid any further testing and get done with her cancer surgery/treatment.

## 2018-12-12 ENCOUNTER — Other Ambulatory Visit: Payer: Self-pay

## 2018-12-12 ENCOUNTER — Encounter: Payer: Self-pay | Admitting: Internal Medicine

## 2018-12-12 ENCOUNTER — Ambulatory Visit: Payer: Medicare Other | Admitting: Internal Medicine

## 2018-12-12 VITALS — BP 126/82 | HR 88 | Temp 97.7°F | Ht 65.0 in | Wt 178.6 lb

## 2018-12-12 DIAGNOSIS — E039 Hypothyroidism, unspecified: Secondary | ICD-10-CM

## 2018-12-12 DIAGNOSIS — E663 Overweight: Secondary | ICD-10-CM

## 2018-12-12 DIAGNOSIS — E1165 Type 2 diabetes mellitus with hyperglycemia: Secondary | ICD-10-CM

## 2018-12-12 DIAGNOSIS — G500 Trigeminal neuralgia: Secondary | ICD-10-CM | POA: Diagnosis not present

## 2018-12-12 DIAGNOSIS — I10 Essential (primary) hypertension: Secondary | ICD-10-CM | POA: Diagnosis not present

## 2018-12-12 NOTE — Patient Instructions (Signed)
Diabetes Mellitus and Exercise Exercising regularly is important for your overall health, especially when you have diabetes (diabetes mellitus). Exercising is not only about losing weight. It has many other health benefits, such as increasing muscle strength and bone density and reducing body fat and stress. This leads to improved fitness, flexibility, and endurance, all of which result in better overall health. Exercise has additional benefits for people with diabetes, including:  Reducing appetite.  Helping to lower and control blood glucose.  Lowering blood pressure.  Helping to control amounts of fatty substances (lipids) in the blood, such as cholesterol and triglycerides.  Helping the body to respond better to insulin (improving insulin sensitivity).  Reducing how much insulin the body needs.  Decreasing the risk for heart disease by: ? Lowering cholesterol and triglyceride levels. ? Increasing the levels of good cholesterol. ? Lowering blood glucose levels. What is my activity plan? Your health care provider or certified diabetes educator can help you make a plan for the type and frequency of exercise (activity plan) that works for you. Make sure that you:  Do at least 150 minutes of moderate-intensity or vigorous-intensity exercise each week. This could be brisk walking, biking, or water aerobics. ? Do stretching and strength exercises, such as yoga or weightlifting, at least 2 times a week. ? Spread out your activity over at least 3 days of the week.  Get some form of physical activity every day. ? Do not go more than 2 days in a row without some kind of physical activity. ? Avoid being inactive for more than 30 minutes at a time. Take frequent breaks to walk or stretch.  Choose a type of exercise or activity that you enjoy, and set realistic goals.  Start slowly, and gradually increase the intensity of your exercise over time. What do I need to know about managing my  diabetes?   Check your blood glucose before and after exercising. ? If your blood glucose is 240 mg/dL (13.3 mmol/L) or higher before you exercise, check your urine for ketones. If you have ketones in your urine, do not exercise until your blood glucose returns to normal. ? If your blood glucose is 100 mg/dL (5.6 mmol/L) or lower, eat a snack containing 15-20 grams of carbohydrate. Check your blood glucose 15 minutes after the snack to make sure that your level is above 100 mg/dL (5.6 mmol/L) before you start your exercise.  Know the symptoms of low blood glucose (hypoglycemia) and how to treat it. Your risk for hypoglycemia increases during and after exercise. Common symptoms of hypoglycemia can include: ? Hunger. ? Anxiety. ? Sweating and feeling clammy. ? Confusion. ? Dizziness or feeling light-headed. ? Increased heart rate or palpitations. ? Blurry vision. ? Tingling or numbness around the mouth, lips, or tongue. ? Tremors or shakes. ? Irritability.  Keep a rapid-acting carbohydrate snack available before, during, and after exercise to help prevent or treat hypoglycemia.  Avoid injecting insulin into areas of the body that are going to be exercised. For example, avoid injecting insulin into: ? The arms, when playing tennis. ? The legs, when jogging.  Keep records of your exercise habits. Doing this can help you and your health care provider adjust your diabetes management plan as needed. Write down: ? Food that you eat before and after you exercise. ? Blood glucose levels before and after you exercise. ? The type and amount of exercise you have done. ? When your insulin is expected to peak, if you use   insulin. Avoid exercising at times when your insulin is peaking.  When you start a new exercise or activity, work with your health care provider to make sure the activity is safe for you, and to adjust your insulin, medicines, or food intake as needed.  Drink plenty of water while  you exercise to prevent dehydration or heat stroke. Drink enough fluid to keep your urine clear or pale yellow. Summary  Exercising regularly is important for your overall health, especially when you have diabetes (diabetes mellitus).  Exercising has many health benefits, such as increasing muscle strength and bone density and reducing body fat and stress.  Your health care provider or certified diabetes educator can help you make a plan for the type and frequency of exercise (activity plan) that works for you.  When you start a new exercise or activity, work with your health care provider to make sure the activity is safe for you, and to adjust your insulin, medicines, or food intake as needed. This information is not intended to replace advice given to you by your health care provider. Make sure you discuss any questions you have with your health care provider. Document Released: 09/04/2003 Document Revised: 12/23/2016 Document Reviewed: 11/24/2015 Elsevier Interactive Patient Education  2019 [employer]  

## 2018-12-13 ENCOUNTER — Other Ambulatory Visit: Payer: Self-pay | Admitting: Nurse Practitioner

## 2018-12-13 DIAGNOSIS — E1165 Type 2 diabetes mellitus with hyperglycemia: Secondary | ICD-10-CM

## 2018-12-13 LAB — CMP14+EGFR
ALT: 16 IU/L (ref 0–32)
AST: 20 IU/L (ref 0–40)
Albumin/Globulin Ratio: 1.6 (ref 1.2–2.2)
Albumin: 4.1 g/dL (ref 3.8–4.8)
Alkaline Phosphatase: 91 IU/L (ref 39–117)
BUN/Creatinine Ratio: 17 (ref 12–28)
BUN: 11 mg/dL (ref 8–27)
Bilirubin Total: 0.5 mg/dL (ref 0.0–1.2)
CO2: 24 mmol/L (ref 20–29)
Calcium: 9.2 mg/dL (ref 8.7–10.3)
Chloride: 103 mmol/L (ref 96–106)
Creatinine, Ser: 0.66 mg/dL (ref 0.57–1.00)
GFR calc Af Amer: 104 mL/min/{1.73_m2} (ref >59)
GFR calc non Af Amer: 90 mL/min/{1.73_m2} (ref >59)
Globulin, Total: 2.5 g/dL (ref 1.5–4.5)
Glucose: 84 mg/dL (ref 65–99)
Potassium: 3.7 mmol/L (ref 3.5–5.2)
Sodium: 140 mmol/L (ref 134–144)
Total Protein: 6.6 g/dL (ref 6.0–8.5)

## 2018-12-13 LAB — HEMOGLOBIN A1C
Est. average glucose Bld gHb Est-mCnc: 126 mg/dL
Hgb A1c MFr Bld: 6 % — ABNORMAL HIGH (ref 4.8–5.6)

## 2018-12-13 LAB — LIPID PANEL
Chol/HDL Ratio: 2.9 ratio (ref 0.0–4.4)
Cholesterol, Total: 153 mg/dL (ref 100–199)
HDL: 53 mg/dL (ref >39)
LDL Calculated: 80 mg/dL (ref 0–99)
Triglycerides: 101 mg/dL (ref 0–149)
VLDL Cholesterol Cal: 20 mg/dL (ref 5–40)

## 2018-12-13 LAB — TSH: TSH: 1.06 u[IU]/mL (ref 0.450–4.500)

## 2018-12-13 LAB — T4, FREE: Free T4: 0.98 ng/dL (ref 0.82–1.77)

## 2018-12-16 NOTE — Progress Notes (Signed)
Subjective:     Patient ID: Christina Hernandez , female    DOB: Dec 14, 1950 , 68 y.o.   MRN: [id number]   Chief Complaint  Patient presents with  . Hypertension  . Diabetes    HPI  Hypertension This is a chronic problem. The current episode started more than 1 year ago. The problem has been gradually improving since onset. The problem is controlled. Pertinent negatives include no blurred vision, chest pain, palpitations or shortness of breath. Risk factors for coronary artery disease include diabetes mellitus, dyslipidemia, post-menopausal state and sedentary lifestyle.  Diabetes She presents for her follow-up diabetic visit. She has type 2 diabetes mellitus. Her disease course has been stable. There are no hypoglycemic associated symptoms. Pertinent negatives for diabetes include no blurred vision and no chest pain. There are no hypoglycemic complications. She is compliant with treatment most of the time. She is following a diabetic diet. She participates in exercise intermittently. An ACE inhibitor/angiotensin II receptor blocker is being taken.     Past Medical History:  Diagnosis Date  . Arthritis   . Breast cancer, right breast [facility])    pending right breast lumpectomy on September 01, 2018  . Diabetes mellitus type II, controlled ([place])    On Kombiglyze  . Family history of ovarian cancer   . Family history of thyroid cancer   . GERD (gastroesophageal reflux disease)    rarely occurs- no meds  . Hyperlipidemia associated with type 2 diabetes mellitus ([place])   . Hypertension   . Hypothyroidism   . Trigeminal neuralgia of left side of face 2015   s/p Gamma knife     Family History  Problem Relation Age of Onset  . Hypertension Mother   . Kidney failure Mother   . Congestive Heart Failure Mother   . Heart Problems Father   . Stroke Father   . Pneumonia Father   . Heart Problems Sister 40       heart transplant  . Stroke Paternal Grandmother   . Dementia Paternal  Grandmother   . Stroke Paternal Grandfather   . Cardiomyopathy Sister   . Ovarian cancer Sister 42       d. 59  . Thyroid disease Sister   . Thyroid disease Sister   . Thyroid cancer Niece 51  . Breast cancer Neg Hx      Current Outpatient Medications:  .  acetaminophen (TYLENOL) 500 MG tablet, Take 1 tablet (500 mg total) by mouth every 8 (eight) hours as needed., Disp: 30 tablet, Rfl: 1 .  BIOTIN MAXIMUM PO, Take by mouth., Disp: , Rfl:  .  cetirizine (ZYRTEC) 10 MG tablet, Take 1 tablet (10 mg total) by mouth daily. (Patient taking differently: Take 10 mg by mouth daily as needed for allergies. ), Disp: 30 tablet, Rfl: 3 .  Cholecalciferol (D3 ADULT PO), Take by mouth., Disp: , Rfl:  .  DULoxetine (CYMBALTA) 60 MG capsule, Take 1 capsule (60 mg total) by mouth daily., Disp: 90 capsule, Rfl: 4 .  gabapentin (NEURONTIN) 300 MG capsule, Take 3 capsules (900 mg total) by mouth 3 (three) times daily., Disp: 810 capsule, Rfl: 4 .  HYDROcodone-acetaminophen (NORCO/VICODIN) 5-325 MG tablet, Take 1 tablet by mouth every 6 (six) hours as needed for moderate pain., Disp: 12 tablet, Rfl: 0 .  ibuprofen (ADVIL,MOTRIN) 400 MG tablet, Take by mouth., Disp: , Rfl:  .  losartan-hydrochlorothiazide (HYZAAR) 50-12.5 MG tablet, TAKE 1 TABLET BY ORAL ROUTE EVERY DAY, Disp: 90 tablet, Rfl:  1 .  pravastatin (PRAVACHOL) 40 MG tablet, Take 40 mg by mouth daily., Disp: , Rfl:  .  SYNTHROID 75 MCG tablet, TAKE 1 TABLET DAILY MON-SAT, HOLD ON SUNDAY., Disp: 26 tablet, Rfl: 2 .  KOMBIGLYZE XR 5-500 MG TB24, TAKE 1 TABLET BY MOUTH DAILY BEFORE SUPPER., Disp: 90 tablet, Rfl: 1 .  NONFORMULARY OR COMPOUNDED ITEM, Compounded cream for right knee as needed, Disp: , Rfl:    Allergies  Allergen Reactions  . Cyclobenzaprine Nausea Only     Review of Systems  Constitutional: Negative.   Eyes: Negative for blurred vision.  Respiratory: Negative.  Negative for shortness of breath.   Cardiovascular: Negative.   Negative for chest pain and palpitations.  Gastrointestinal: Negative.   Neurological: Negative.   Psychiatric/Behavioral: Negative.      Today's Vitals   12/12/18 1143  BP: 126/82  Pulse: 88  Temp: 97.7 F (36.5 C)  TempSrc: Oral  Weight: 178 lb 9.6 oz (81 kg)  Height: '5\' 5"'  (1.651 m)  PainSc: 7   PainLoc: Face   Body mass index is 29.72 kg/m.   Objective:  Physical Exam Vitals signs and nursing note reviewed.  Constitutional:      Appearance: Normal appearance.  HENT:     Head: Normocephalic and atraumatic.  Cardiovascular:     Rate and Rhythm: Normal rate and regular rhythm.     Heart sounds: Normal heart sounds.  Pulmonary:     Effort: Pulmonary effort is normal.     Breath sounds: Normal breath sounds.  Skin:    General: Skin is warm.  Neurological:     General: No focal deficit present.     Mental Status: She is alert.  Psychiatric:        Mood and Affect: Mood normal.        Behavior: Behavior normal.         Assessment And Plan:     1. Essential hypertension  Well controlled. She will continue with current meds. She is encouraged to avoid adding salt to her foods.   2. Uncontrolled type 2 diabetes mellitus with hyperglycemia ([place])  I will check labs as listed below. Importance of dietary compliance was discussed with the patient.   - Lipid panel - CMP14+EGFR - Hemoglobin A1c  3. Trigeminal neuralgia of left side of face  Chronic. Unfortunately, she is still experiencing symptoms. She is also followed by Neurology.   4. Hypothyroidism, unspecified type  I will check thyroid panel and adjust meds as needed.  - TSH - T4, Free  5. Overweight (BMI 25.0-29.9)  Importance of achieving optimal weight to decrease risk of cardiovascular disease and cancers was discussed with the patient in full detail. She is encouraged to start slowly - start with 10 minutes twice daily at least three to four days per week and to gradually build to 30 minutes  five days weekly. She was given tips to incorporate more activity into her daily routine - take stairs when possible, park farther away from grocery stores, etc.    [name], MD    THE PATIENT IS ENCOURAGED TO PRACTICE SOCIAL DISTANCING DUE TO THE COVID-19 PANDEMIC.

## 2019-01-11 NOTE — Progress Notes (Signed)
  Radiation Oncology         [phone]) [phone] ________________________________  Name: Christina Hernandez MRN: [id number]  Date: 11/10/2018  DOB: Dec 15, 1950  End of Treatment Note  Diagnosis:   68 y.o. female with Stage [id number], ER/PR positive, low grade DCIS of the right breast  Indication for treatment:  Curative       Radiation treatment dates:   10/16/2018 - 11/10/2018  Site/dose:    1. The patient initially received a dose of 42.56 Gy in 16 fractions to the right breast using whole-breast tangent fields. This was delivered using a 3-D conformal technique. 2. The patient then received a boost to the seroma. This delivered an additional 8 Gy in 4 fractions using a 3 field photon technique due to the depth of the seroma. The total dose was 50.56 Gy.  Beams/energy:    1. 3D / 6X Photon 2. 10X, 6X photon boost  Narrative: The patient tolerated radiation treatment relatively well.   The patient had some expected hyperpigmentation changes and itching as she progressed during treatment. Moist desquamation was not present at the end of treatment. She is using her Radiaplex gel as ordered. She also noted mild fatigue.  Plan: The patient has completed radiation treatment. The patient will return to radiation oncology clinic for routine followup in one month. I advised the patient to call or return sooner if they have any questions or concerns related to their recovery or treatment. ________________________________  Jodelle Gross, MD, PhD  This document serves as a record of services personally performed by [name], MD. It was created on his behalf by Rae Lips, a trained medical scribe. The creation of this record is based on the scribe's personal observations and the provider's statements to them. This document has been checked and approved by the attending provider.

## 2019-01-24 ENCOUNTER — Other Ambulatory Visit: Payer: Self-pay

## 2019-01-24 ENCOUNTER — Ambulatory Visit (INDEPENDENT_AMBULATORY_CARE_PROVIDER_SITE_OTHER)
Admission: RE | Admit: 2019-01-24 | Discharge: 2019-01-24 | Disposition: A | Discharge status: Home / Self Care | Payer: Medicare Other | Source: Ambulatory Visit | Attending: Pulmonary Disease | Admitting: Pulmonary Disease

## 2019-01-24 DIAGNOSIS — R918 Other nonspecific abnormal finding of lung field: Secondary | ICD-10-CM

## 2019-02-08 ENCOUNTER — Other Ambulatory Visit: Payer: Self-pay | Admitting: Internal Medicine

## 2019-02-08 DIAGNOSIS — J069 Acute upper respiratory infection, unspecified: Secondary | ICD-10-CM

## 2019-02-20 ENCOUNTER — Telehealth: Payer: Self-pay | Admitting: Internal Medicine

## 2019-02-20 NOTE — Telephone Encounter (Signed)
I called the patient to change telephone AWV to office visit. Her husband said that she's not there, but he will ask her to call back. VDM (DD)

## 2019-02-27 ENCOUNTER — Encounter: Payer: Self-pay | Admitting: Internal Medicine

## 2019-02-27 ENCOUNTER — Ambulatory Visit (INDEPENDENT_AMBULATORY_CARE_PROVIDER_SITE_OTHER): Payer: Medicare Other | Admitting: Internal Medicine

## 2019-02-27 ENCOUNTER — Other Ambulatory Visit: Payer: Self-pay

## 2019-02-27 VITALS — BP 132/72 | HR 83 | Temp 97.7°F | Ht 65.0 in | Wt 176.8 lb

## 2019-02-27 DIAGNOSIS — E663 Overweight: Secondary | ICD-10-CM

## 2019-02-27 DIAGNOSIS — I10 Essential (primary) hypertension: Secondary | ICD-10-CM

## 2019-02-27 DIAGNOSIS — Z23 Encounter for immunization: Secondary | ICD-10-CM | POA: Diagnosis not present

## 2019-02-27 DIAGNOSIS — E1165 Type 2 diabetes mellitus with hyperglycemia: Secondary | ICD-10-CM

## 2019-02-27 NOTE — Patient Instructions (Signed)
Diabetes Mellitus and Foot Care Foot care is an important part of your health, especially when you have diabetes. Diabetes may cause you to have problems because of poor blood flow (circulation) to your feet and legs, which can cause your skin to:  Become thinner and drier.  Break more easily.  Heal more slowly.  Peel and crack. You may also have nerve damage (neuropathy) in your legs and feet, causing decreased feeling in them. This means that you may not notice minor injuries to your feet that could lead to more serious problems. Noticing and addressing any potential problems early is the best way to prevent future foot problems. How to care for your feet Foot hygiene  Wash your feet daily with warm water and mild soap. Do not use hot water. Then, pat your feet and the areas between your toes until they are completely dry. Do not soak your feet as this can dry your skin.  Trim your toenails straight across. Do not dig under them or around the cuticle. File the edges of your nails with an emery board or nail file.  Apply a moisturizing lotion or petroleum jelly to the skin on your feet and to dry, brittle toenails. Use lotion that does not contain alcohol and is unscented. Do not apply lotion between your toes. Shoes and socks  Wear clean socks or stockings every day. Make sure they are not too tight. Do not wear knee-high stockings since they may decrease blood flow to your legs.  Wear shoes that fit properly and have enough cushioning. Always look in your shoes before you put them on to be sure there are no objects inside.  To break in new shoes, wear them for just a few hours a day. This prevents injuries on your feet. Wounds, scrapes, corns, and calluses  Check your feet daily for blisters, cuts, bruises, sores, and redness. If you cannot see the bottom of your feet, use a mirror or ask someone for help.  Do not cut corns or calluses or try to remove them with medicine.  If you  find a minor scrape, cut, or break in the skin on your feet, keep it and the skin around it clean and dry. You may clean these areas with mild soap and water. Do not clean the area with peroxide, alcohol, or iodine.  If you have a wound, scrape, corn, or callus on your foot, look at it several times a day to make sure it is healing and not infected. Check for: ? Redness, swelling, or pain. ? Fluid or blood. ? Warmth. ? Pus or a bad smell. General instructions  Do not cross your legs. This may decrease blood flow to your feet.  Do not use heating pads or hot water bottles on your feet. They may burn your skin. If you have lost feeling in your feet or legs, you may not know this is happening until it is too late.  Protect your feet from hot and cold by wearing shoes, such as at the beach or on hot pavement.  Schedule a complete foot exam at least once a year (annually) or more often if you have foot problems. If you have foot problems, report any cuts, sores, or bruises to your health care provider immediately. Contact a health care provider if:  You have a medical condition that increases your risk of infection and you have any cuts, sores, or bruises on your feet.  You have an injury that is not   healing.  You have redness on your legs or feet.  You feel burning or tingling in your legs or feet.  You have pain or cramps in your legs and feet.  Your legs or feet are numb.  Your feet always feel cold.  You have pain around a toenail. Get help right away if:  You have a wound, scrape, corn, or callus on your foot and: ? You have pain, swelling, or redness that gets worse. ? You have fluid or blood coming from the wound, scrape, corn, or callus. ? Your wound, scrape, corn, or callus feels warm to the touch. ? You have pus or a bad smell coming from the wound, scrape, corn, or callus. ? You have a fever. ? You have a red line going up your leg. Summary  Check your feet every day  for cuts, sores, red spots, swelling, and blisters.  Moisturize feet and legs daily.  Wear shoes that fit properly and have enough cushioning.  If you have foot problems, report any cuts, sores, or bruises to your health care provider immediately.  Schedule a complete foot exam at least once a year (annually) or more often if you have foot problems. This information is not intended to replace advice given to you by your health care provider. Make sure you discuss any questions you have with your health care provider. Document Released: 06/11/2000 Document Revised: 07/27/2017 Document Reviewed: 07/16/2016 Elsevier Patient Education  2020 Else[employer] Inc[employer]  

## 2019-02-28 ENCOUNTER — Telehealth: Payer: Self-pay | Admitting: Internal Medicine

## 2019-02-28 LAB — BMP8+EGFR
BUN/Creatinine Ratio: 16 (ref 12–28)
BUN: 11 mg/dL (ref 8–27)
CO2: 19 mmol/L — ABNORMAL LOW (ref 20–29)
Calcium: 9.6 mg/dL (ref 8.7–10.3)
Chloride: 109 mmol/L — ABNORMAL HIGH (ref 96–106)
Creatinine, Ser: 0.68 mg/dL (ref 0.57–1.00)
GFR calc Af Amer: 103 mL/min/1.73 (ref >59)
GFR calc non Af Amer: 90 mL/min/1.73 (ref >59)
Glucose: 128 mg/dL — ABNORMAL HIGH (ref 65–99)
Potassium: 3.6 mmol/L (ref 3.5–5.2)
Sodium: 143 mmol/L (ref 134–144)

## 2019-02-28 LAB — HEMOGLOBIN A1C
Est. average glucose Bld gHb Est-mCnc: 128 mg/dL
Hgb A1c MFr Bld: 6.1 % — ABNORMAL HIGH (ref 4.8–5.6)

## 2019-02-28 NOTE — Telephone Encounter (Signed)
I called the patient to change 9/10 telephone AWV to virtual or in office.  I called both the home and mobile numbers, and there was no answer and no option to leave a message. VDM (DD)

## 2019-03-06 ENCOUNTER — Telehealth: Payer: Self-pay | Admitting: Internal Medicine

## 2019-03-06 NOTE — Telephone Encounter (Signed)
I spoke with the patient and she agreed to come into the office for her appointment on Thursday 03/08/2019.

## 2019-03-07 ENCOUNTER — Other Ambulatory Visit: Payer: Self-pay | Admitting: Nurse Practitioner

## 2019-03-07 LAB — HM DIABETES EYE EXAM

## 2019-03-08 ENCOUNTER — Ambulatory Visit (INDEPENDENT_AMBULATORY_CARE_PROVIDER_SITE_OTHER): Payer: Medicare Other

## 2019-03-08 ENCOUNTER — Other Ambulatory Visit: Payer: Self-pay

## 2019-03-08 ENCOUNTER — Ambulatory Visit: Payer: Medicare Other | Admitting: Internal Medicine

## 2019-03-08 VITALS — BP 132/76 | HR 98 | Temp 98.0°F | Ht 63.8 in | Wt 173.0 lb

## 2019-03-08 DIAGNOSIS — E1165 Type 2 diabetes mellitus with hyperglycemia: Secondary | ICD-10-CM | POA: Diagnosis not present

## 2019-03-08 DIAGNOSIS — Z0000 Encounter for general adult medical examination without abnormal findings: Secondary | ICD-10-CM

## 2019-03-08 LAB — POCT URINALYSIS DIPSTICK
Bilirubin, UA: NEGATIVE
Blood, UA: NEGATIVE
Glucose, UA: NEGATIVE
Ketones, UA: NEGATIVE
Leukocytes, UA: NEGATIVE
Nitrite, UA: NEGATIVE
Protein, UA: NEGATIVE
Spec Grav, UA: >=1.03 — AB (ref 1.010–1.025)
Urobilinogen, UA: 0.2 E.U./dL
pH, UA: 5.5 (ref 5.0–8.0)

## 2019-03-08 LAB — POCT UA - MICROALBUMIN
Albumin/Creatinine Ratio, Urine, POC: <30
Creatinine, POC: 300 mg/dL
Microalbumin Ur, POC: 10 mg/L

## 2019-03-08 NOTE — Patient Instructions (Signed)
Christina Hernandez , Thank you for taking time to come for your Medicare Wellness Visit. I appreciate your ongoing commitment to your health goals. Please review the following plan we discussed and let me know if I can assist you in the future.   Screening recommendations/referrals: Colonoscopy: 01/2017 Mammogram: 06/2018 Bone Density: 07/2014 Recommended yearly ophthalmology/optometry visit for glaucoma screening and checkup Recommended yearly dental visit for hygiene and checkup  Vaccinations: Influenza vaccine: 02/2019 Pneumococcal vaccine: 04/2016 Tdap vaccine: 04/2011 Shingles vaccine: discussed    Advanced directives: Advance directive discussed with you today. Even though you declined this today please call our office should you change your mind and we can give you the proper paperwork for you to fill out.   Conditions/risks identified: overweight  Next appointment: 06/05/2019 at 2:15   Preventive Care 70 Years and Older, Female Preventive care refers to lifestyle choices and visits with your health care provider that can promote health and wellness. What does preventive care include?  A yearly physical exam. This is also called an annual well check.  Dental exams once or twice a year.  Routine eye exams. Ask your health care provider how often you should have your eyes checked.  Personal lifestyle choices, including:  Daily care of your teeth and gums.  Regular physical activity.  Eating a healthy diet.  Avoiding tobacco and drug use.  Limiting alcohol use.  Practicing safe sex.  Taking low-dose aspirin every day.  Taking vitamin and mineral supplements as recommended by your health care provider. What happens during an annual well check? The services and screenings done by your health care provider during your annual well check will depend on your age, overall health, lifestyle risk factors, and family history of disease. Counseling  Your health care provider may  ask you questions about your:  Alcohol use.  Tobacco use.  Drug use.  Emotional well-being.  Home and relationship well-being.  Sexual activity.  Eating habits.  History of falls.  Memory and ability to understand (cognition).  Work and work Statistician.  Reproductive health. Screening  You may have the following tests or measurements:  Height, weight, and BMI.  Blood pressure.  Lipid and cholesterol levels. These may be checked every 5 years, or more frequently if you are over 1 years old.  Skin check.  Lung cancer screening. You may have this screening every year starting at age 38 if you have a 30-pack-year history of smoking and currently smoke or have quit within the past 15 years.  Fecal occult blood test (FOBT) of the stool. You may have this test every year starting at age 48.  Flexible sigmoidoscopy or colonoscopy. You may have a sigmoidoscopy every 5 years or a colonoscopy every 10 years starting at age 21.  Hepatitis C blood test.  Hepatitis B blood test.  Sexually transmitted disease (STD) testing.  Diabetes screening. This is done by checking your blood sugar (glucose) after you have not eaten for a while (fasting). You may have this done every 1-3 years.  Bone density scan. This is done to screen for osteoporosis. You may have this done starting at age 49.  Mammogram. This may be done every 1-2 years. Talk to your health care provider about how often you should have regular mammograms. Talk with your health care provider about your test results, treatment options, and if necessary, the need for more tests. Vaccines  Your health care provider may recommend certain vaccines, such as:  Influenza vaccine. This is recommended every  year.  Tetanus, diphtheria, and acellular pertussis (Tdap, Td) vaccine. You may need a Td booster every 10 years.  Zoster vaccine. You may need this after age 68.  Pneumococcal 13-valent conjugate (PCV13) vaccine. One  dose is recommended after age 5.  Pneumococcal polysaccharide (PPSV23) vaccine. One dose is recommended after age 48. Talk to your health care provider about which screenings and vaccines you need and how often you need them. This information is not intended to replace advice given to you by your health care provider. Make sure you discuss any questions you have with your health care provider. Document Released: 07/11/2015 Document Revised: 03/03/2016 Document Reviewed: 04/15/2015 Elsevier Interactive Patient Education  2017 [place] Prevention in the Home Falls can cause injuries. They can happen to people of all ages. There are many things you can do to make your home safe and to help prevent falls. What can I do on the outside of my home?  Regularly fix the edges of walkways and driveways and fix any cracks.  Remove anything that might make you trip as you walk through a door, such as a raised step or threshold.  Trim any bushes or trees on the path to your home.  Use bright outdoor lighting.  Clear any walking paths of anything that might make someone trip, such as rocks or tools.  Regularly check to see if handrails are loose or broken. Make sure that both sides of any steps have handrails.  Any raised decks and porches should have guardrails on the edges.  Have any leaves, snow, or ice cleared regularly.  Use sand or salt on walking paths during winter.  Clean up any spills in your garage right away. This includes oil or grease spills. What can I do in the bathroom?  Use night lights.  Install grab bars by the toilet and in the tub and shower. Do not use towel bars as grab bars.  Use non-skid mats or decals in the tub or shower.  If you need to sit down in the shower, use a plastic, non-slip stool.  Keep the floor dry. Clean up any water that spills on the floor as soon as it happens.  Remove soap buildup in the tub or shower regularly.  Attach bath  mats securely with double-sided non-slip rug tape.  Do not have throw rugs and other things on the floor that can make you trip. What can I do in the bedroom?  Use night lights.  Make sure that you have a light by your bed that is easy to reach.  Do not use any sheets or blankets that are too big for your bed. They should not hang down onto the floor.  Have a firm chair that has side arms. You can use this for support while you get dressed.  Do not have throw rugs and other things on the floor that can make you trip. What can I do in the kitchen?  Clean up any spills right away.  Avoid walking on wet floors.  Keep items that you use a lot in easy-to-reach places.  If you need to reach something above you, use a strong step stool that has a grab bar.  Keep electrical cords out of the way.  Do not use floor polish or wax that makes floors slippery. If you must use wax, use non-skid floor wax.  Do not have throw rugs and other things on the floor that can make you trip. What can  I do with my stairs?  Do not leave any items on the stairs.  Make sure that there are handrails on both sides of the stairs and use them. Fix handrails that are broken or loose. Make sure that handrails are as long as the stairways.  Check any carpeting to make sure that it is firmly attached to the stairs. Fix any carpet that is loose or worn.  Avoid having throw rugs at the top or bottom of the stairs. If you do have throw rugs, attach them to the floor with carpet tape.  Make sure that you have a light switch at the top of the stairs and the bottom of the stairs. If you do not have them, ask someone to add them for you. What else can I do to help prevent falls?  Wear shoes that:  Do not have high heels.  Have rubber bottoms.  Are comfortable and fit you well.  Are closed at the toe. Do not wear sandals.  If you use a stepladder:  Make sure that it is fully opened. Do not climb a closed  stepladder.  Make sure that both sides of the stepladder are locked into place.  Ask someone to hold it for you, if possible.  Clearly mark and make sure that you can see:  Any grab bars or handrails.  First and last steps.  Where the edge of each step is.  Use tools that help you move around (mobility aids) if they are needed. These include:  Canes.  Walkers.  Scooters.  Crutches.  Turn on the lights when you go into a dark area. Replace any light bulbs as soon as they burn out.  Set up your furniture so you have a clear path. Avoid moving your furniture around.  If any of your floors are uneven, fix them.  If there are any pets around you, be aware of where they are.  Review your medicines with your doctor. Some medicines can make you feel dizzy. This can increase your chance of falling. Ask your doctor what other things that you can do to help prevent falls. This information is not intended to replace advice given to you by your health care provider. Make sure you discuss any questions you have with your health care provider. Document Released: 04/10/2009 Document Revised: 11/20/2015 Document Reviewed: 07/19/2014 Elsevier Interactive Patient Education  2017 [employer].

## 2019-03-08 NOTE — Progress Notes (Signed)
Subjective:   Christina Hernandez is a 68 y.o. female who presents for Medicare Annual (Subsequent) preventive examination.  Review of Systems:  n/a Cardiac Risk Factors include: advanced age (>20men, >60 women);diabetes mellitus;hypertension;sedentary lifestyle     Objective:     Vitals: BP 132/76 (BP Location: Left Arm, Patient Position: Sitting, Cuff Size: Normal)   Pulse 98   Temp 98 F (36.7 C) (Oral)   Ht 5' 3.8" (1.621 m)   Wt 173 lb (78.5 kg)   SpO2 97%   BMI 29.88 kg/m   Body mass index is 29.88 kg/m.  Advanced Directives 03/08/2019 08/28/2018 05/10/2018 09/01/2016 05/18/2011  Does Patient Have a Medical Advance Directive? No No No No Patient does not have advance directive  Would patient like information on creating a medical advance directive? - No - Patient declined Yes (MAU/Ambulatory/Procedural Areas - Information given) No - Patient declined -    Tobacco Social History   Tobacco Use  Smoking Status Never Smoker  Smokeless Tobacco Never Used     Counseling given: Not Answered   Clinical Intake:  Pre-visit preparation completed: Yes  Pain : 0-10 Pain Score: 5  Pain Type: Chronic pain Pain Location: Face Pain Descriptors / Indicators: Burning Pain Onset: More than a month ago Pain Frequency: Constant Pain Relieving Factors: nothing helps  Pain Relieving Factors: nothing helps  Nutritional Status: BMI 25 -29 Overweight Nutritional Risks: None Diabetes: Yes CBG done?: No Did pt. bring in CBG monitor from home?: No  How often do you need to have someone help you when you read instructions, pamphlets, or other written materials from your doctor or pharmacy?: 1 - Never What is the last grade level you completed in school?: Bs degree  Interpreter Needed?: No  Information entered by :: NAllen LPN  Past Medical History:  Diagnosis Date  . Arthritis   . Breast cancer, right breast [facility])    pending right breast lumpectomy on September 01, 2018  .  Diabetes mellitus type II, controlled ([place])    On Kombiglyze  . Family history of ovarian cancer   . Family history of thyroid cancer   . GERD (gastroesophageal reflux disease)    rarely occurs- no meds  . Hyperlipidemia associated with type 2 diabetes mellitus ([place])   . Hypertension   . Hypothyroidism   . Trigeminal neuralgia of left side of face 2015   s/p Gamma knife   Past Surgical History:  Procedure Laterality Date  . BREAST LUMPECTOMY WITH RADIOACTIVE SEED LOCALIZATION Right 09/01/2018   Procedure: RIGHT BREAST LUMPECTOMY WITH RADIOACTIVE SEED LOCALIZATION;  Surgeon: [name], MD;  Location: [place];  Service: General;  Laterality: Right;  . COLONOSCOPY    . Gamma Knife Left 04/08/2016  . HYSTEROSCOPY W/D&C  05/24/2011   Procedure: DILATATION AND CURETTAGE (D&C) /HYSTEROSCOPY;  Surgeon: [name], MD;  Location: [facility];  Service: Gynecology;  Laterality: N/A;  . KNEE SURGERY Right 06/10/15   torn ligament  . THYROIDECTOMY N/A 09/08/2016   Procedure: TOTAL THYROIDECTOMY;  Surgeon: [name], MD;  Location: [place];  Service: ENT;  Laterality: N/A;  . TUBAL LIGATION    . uterine polyp    . WISDOM TOOTH EXTRACTION Left 2014   Family History  Problem Relation Age of Onset  . Hypertension Mother   . Kidney failure Mother   . Congestive Heart Failure Mother   . Heart Problems Father   . Stroke Father   . Pneumonia Father   .  Heart Problems Sister 40       heart transplant  . Stroke Paternal Grandmother   . Dementia Paternal Grandmother   . Stroke Paternal Grandfather   . Cardiomyopathy Sister   . Ovarian cancer Sister 14       d. 64  . Thyroid disease Sister   . Thyroid disease Sister   . Thyroid cancer Niece 68  . Breast cancer Neg Hx    Social History   Socioeconomic History  . Marital status: Married    Spouse name: [name]  . Number of children: 4  . Years of education: BS  . Highest education level: Not on file  Occupational History  .  Occupation: Retired    Fish farm manager: OTHER  Social Needs  . Financial resource strain: Not hard at all  . Food insecurity    Worry: Never true    Inability: Never true  . Transportation needs    Medical: No    Non-medical: No  Tobacco Use  . Smoking status: Never Smoker  . Smokeless tobacco: Never Used  Substance and Sexual Activity  . Alcohol use: No  . Drug use: No  . Sexual activity: Not Currently  Lifestyle  . Physical activity    Days per week: 0 days    Minutes per session: 0 min  . Stress: Only a little  Relationships  . Social Herbalist on phone: Not on file    Gets together: Not on file    Attends religious service: Not on file    Active member of club or organization: Not on file    Attends meetings of clubs or organizations: Not on file    Relationship status: Not on file  Other Topics Concern  . Not on file  Social History Narrative   Patient lives at home with family.   Caffeine Use: occasionally      Lives at home with husband and son.  Is a retired Pharmacist, hospital    Outpatient Encounter Medications as of 03/08/2019  Medication Sig  . acetaminophen (TYLENOL) 500 MG tablet Take 1 tablet (500 mg total) by mouth every 8 (eight) hours as needed.  . baclofen (LIORESAL) 10 MG tablet Take 10 mg by mouth 2 (two) times daily.  Marland Kitchen BIOTIN MAXIMUM PO Take by mouth.  . cetirizine (ZYRTEC) 10 MG tablet Take 1 tablet (10 mg total) by mouth daily. (Patient taking differently: Take 10 mg by mouth daily as needed for allergies. )  . Cholecalciferol (D3 ADULT PO) Take by mouth.  . DULoxetine (CYMBALTA) 60 MG capsule Take 1 capsule (60 mg total) by mouth daily.  Marland Kitchen gabapentin (NEURONTIN) 300 MG capsule Take 3 capsules (900 mg total) by mouth 3 (three) times daily.  Marland Kitchen ibuprofen (ADVIL,MOTRIN) 400 MG tablet Take by mouth.  Marland Kitchen KOMBIGLYZE XR 5-500 MG TB24 TAKE 1 TABLET BY MOUTH DAILY BEFORE SUPPER.  Marland Kitchen losartan-hydrochlorothiazide (HYZAAR) 50-12.5 MG tablet TAKE 1 TABLET BY MOUTH  EVERY DAY  . meloxicam (MOBIC) 15 MG tablet Take 15 mg by mouth daily.  . NONFORMULARY OR COMPOUNDED ITEM Compounded cream for right knee as needed  . pravastatin (PRAVACHOL) 40 MG tablet TAKE 1 TABLET BY MOUTH EVERY DAY  . SYNTHROID 75 MCG tablet TAKE 1 TABLET DAILY MON-SAT, HOLD ON SUNDAY.  Marland Kitchen topiramate (TOPAMAX) 50 MG tablet Take 50 mg by mouth 2 (two) times daily.  Marland Kitchen HYDROcodone-acetaminophen (NORCO/VICODIN) 5-325 MG tablet Take 1 tablet by mouth every 6 (six) hours as needed for moderate  pain. (Patient not taking: Reported on 02/27/2019)   No facility-administered encounter medications on file as of 03/08/2019.     Activities of Daily Living In your present state of health, do you have any difficulty performing the following activities: 03/08/2019 09/01/2018  Hearing? Y N  Comment sometimes -  Vision? N N  Difficulty concentrating or making decisions? N N  Walking or climbing stairs? Y N  Comment due to right knee -  Dressing or bathing? N N  Doing errands, shopping? N -  Preparing Food and eating ? N -  Using the Toilet? N -  In the past six months, have you accidently leaked urine? Y -  Do you have problems with loss of bowel control? N -  Managing your Medications? N -  Managing your Finances? N -  Housekeeping or managing your Housekeeping? N -  Some recent data might be hidden    Patient Care Team: Glendale Chard, MD as PCP - General (Internal Medicine) [name], MD as PCP - Cardiology (Cardiology) [name], MD as Consulting Physician (Obstetrics and Gynecology) [name], MD as Consulting Physician (General Surgery) Magrinat, [name], MD as Consulting Physician (Oncology) [name], MD as Consulting Physician (Radiation Oncology) [name], Earlean Polka, MD as Consulting Physician (Neurology) [name], MD as Consulting Physician (Pulmonary Disease)    Assessment:   This is a routine wellness examination for [place].  Exercise Activities and  Dietary recommendations Current Exercise Habits: The patient does not participate in regular exercise at present  Goals    . DIET - INCREASE WATER INTAKE (pt-stated)    . Exercise 150 min/wk Moderate Activity (pt-stated)    . Patient Stated     03/07/2019, wants to start walking       Fall Risk Fall Risk  03/08/2019 02/27/2019 12/12/2018 08/10/2018 07/10/2018  Falls in the past year? 0 0 0 0 0  Risk for fall due to : Medication side effect - - - -  Follow up Falls evaluation completed;Education provided;Falls prevention discussed - - - -   Is the patient's home free of loose throw rugs in walkways, pet beds, electrical cords, etc?   yes      Grab bars in the bathroom? yes      Handrails on the stairs?   yes      Adequate lighting?   yes  Timed Get Up and Go performed: n/a  Depression Screen PHQ 2/9 Scores 03/08/2019 02/27/2019 12/12/2018 08/10/2018  PHQ - 2 Score 0 0 0 0     Cognitive Function     6CIT Screen 03/08/2019 05/10/2018  What Year? 0 points 0 points  What month? 0 points 0 points  What time? 0 points 0 points  Count back from 20 0 points 0 points  Months in reverse 0 points 0 points  Repeat phrase 2 points 0 points  Total Score 2 0    Immunization History  Administered Date(s) Administered  . Fluad Quad(high Dose 65+) 02/27/2019  . Influenza, High Dose Seasonal PF 03/27/2018, 02/27/2019  . Influenza,inj,Quad PF,6+ Mos 04/12/2016  . Pneumococcal Polysaccharide-23 05/25/2016  . Tdap 05/28/2011    Qualifies for Shingles Vaccine? yes  Screening Tests Health Maintenance  Topic Date Due  . OPHTHALMOLOGY EXAM  05/29/1959  . PNA vac Low Risk Adult (2 of 2 - PCV13) 05/25/2017  . HEMOGLOBIN A1C  08/27/2019  . FOOT EXAM  02/27/2020  . MAMMOGRAM  07/10/2020  . TETANUS/TDAP  05/27/2021  . COLONOSCOPY  02/08/2027  . INFLUENZA VACCINE  Completed  . DEXA SCAN  Completed  . Hepatitis C Screening  Completed    Cancer Screenings: Lung: Low Dose CT Chest recommended if  Age 52-80 years, 30 pack-year currently smoking OR have quit w/in 15years. Patient does not qualify. Breast:  Up to date on Mammogram? Yes   Up to date of Bone Density/Dexa? Yes Colorectal: up to date  Additional Screenings: : Hepatitis C Screening: 05/2012     Plan:    Patient wants to start walking.   I have personally reviewed and noted the following in the patient's chart:   . Medical and social history . Use of alcohol, tobacco or illicit drugs  . Current medications and supplements . Functional ability and status . Nutritional status . Physical activity . Advanced directives . List of other physicians . Hospitalizations, surgeries, and ER visits in previous 12 months . Vitals . Screenings to include cognitive, depression, and falls . Referrals and appointments  In addition, I have reviewed and discussed with patient certain preventive protocols, quality metrics, and best practice recommendations. A written personalized care plan for preventive services as well as general preventive health recommendations were provided to patient.     Kellie Simmering, LPN  [phone]

## 2019-03-11 NOTE — Progress Notes (Signed)
Subjective:     Patient ID: Christina Hernandez , female    DOB: 02-Oct-1950 , 68 y.o.   MRN: [id number]   Chief Complaint  Patient presents with  . Diabetes  . Hypertension    HPI  She is here today for diabetes check. She also has [facility] form that needs to be completed.   Diabetes She presents for her follow-up diabetic visit. She has type 2 diabetes mellitus. There are no hypoglycemic associated symptoms. There are no diabetic associated symptoms. Pertinent negatives for diabetes include no blurred vision and no chest pain. There are no hypoglycemic complications. Risk factors for coronary artery disease include diabetes mellitus, dyslipidemia, hypertension, sedentary lifestyle and post-menopausal. She participates in exercise intermittently. An ACE inhibitor/angiotensin II receptor blocker is being taken. Eye exam is current.  Hypertension This is a chronic problem. The current episode started more than 1 year ago. The problem has been gradually improving since onset. The problem is controlled. Pertinent negatives include no blurred vision, chest pain, palpitations or shortness of breath. Compliance problems include exercise.      Past Medical History:  Diagnosis Date  . Arthritis   . Breast cancer, right breast [facility])    pending right breast lumpectomy on September 01, 2018  . Diabetes mellitus type II, controlled ([place])    On Kombiglyze  . Family history of ovarian cancer   . Family history of thyroid cancer   . GERD (gastroesophageal reflux disease)    rarely occurs- no meds  . Hyperlipidemia associated with type 2 diabetes mellitus ([place])   . Hypertension   . Hypothyroidism   . Trigeminal neuralgia of left side of face 2015   s/p Gamma knife     Family History  Problem Relation Age of Onset  . Hypertension Mother   . Kidney failure Mother   . Congestive Heart Failure Mother   . Heart Problems Father   . Stroke Father   . Pneumonia Father   . Heart Problems  Sister 40       heart transplant  . Stroke Paternal Grandmother   . Dementia Paternal Grandmother   . Stroke Paternal Grandfather   . Cardiomyopathy Sister   . Ovarian cancer Sister 31       d. 41  . Thyroid disease Sister   . Thyroid disease Sister   . Thyroid cancer Niece 24  . Breast cancer Neg Hx      Current Outpatient Medications:  .  acetaminophen (TYLENOL) 500 MG tablet, Take 1 tablet (500 mg total) by mouth every 8 (eight) hours as needed., Disp: 30 tablet, Rfl: 1 .  baclofen (LIORESAL) 10 MG tablet, Take 10 mg by mouth 2 (two) times daily., Disp: , Rfl:  .  BIOTIN MAXIMUM PO, Take by mouth., Disp: , Rfl:  .  cetirizine (ZYRTEC) 10 MG tablet, Take 1 tablet (10 mg total) by mouth daily. (Patient taking differently: Take 10 mg by mouth daily as needed for allergies. ), Disp: 30 tablet, Rfl: 3 .  Cholecalciferol (D3 ADULT PO), Take by mouth., Disp: , Rfl:  .  DULoxetine (CYMBALTA) 60 MG capsule, Take 1 capsule (60 mg total) by mouth daily., Disp: 90 capsule, Rfl: 4 .  gabapentin (NEURONTIN) 300 MG capsule, Take 3 capsules (900 mg total) by mouth 3 (three) times daily., Disp: 810 capsule, Rfl: 4 .  ibuprofen (ADVIL,MOTRIN) 400 MG tablet, Take by mouth., Disp: , Rfl:  .  KOMBIGLYZE XR 5-500 MG TB24, TAKE 1 TABLET BY  MOUTH DAILY BEFORE SUPPER., Disp: 90 tablet, Rfl: 1 .  meloxicam (MOBIC) 15 MG tablet, Take 15 mg by mouth daily., Disp: , Rfl:  .  pravastatin (PRAVACHOL) 40 MG tablet, TAKE 1 TABLET BY MOUTH EVERY DAY, Disp: 90 tablet, Rfl: 2 .  SYNTHROID 75 MCG tablet, TAKE 1 TABLET DAILY MON-SAT, HOLD ON SUNDAY., Disp: 26 tablet, Rfl: 2 .  topiramate (TOPAMAX) 50 MG tablet, Take 50 mg by mouth 2 (two) times daily., Disp: , Rfl:  .  HYDROcodone-acetaminophen (NORCO/VICODIN) 5-325 MG tablet, Take 1 tablet by mouth every 6 (six) hours as needed for moderate pain. (Patient not taking: Reported on 02/27/2019), Disp: 12 tablet, Rfl: 0 .  losartan-hydrochlorothiazide (HYZAAR) 50-12.5 MG  tablet, TAKE 1 TABLET BY MOUTH EVERY DAY, Disp: 90 tablet, Rfl: 1 .  NONFORMULARY OR COMPOUNDED ITEM, Compounded cream for right knee as needed, Disp: , Rfl:    Allergies  Allergen Reactions  . Cyclobenzaprine Nausea Only     Review of Systems  Constitutional: Negative.   Eyes: Negative for blurred vision.  Respiratory: Negative.  Negative for shortness of breath.   Cardiovascular: Negative.  Negative for chest pain and palpitations.  Gastrointestinal: Negative.   Neurological: Negative.   Psychiatric/Behavioral: Negative.      Today's Vitals   02/27/19 1214  BP: 132/72  Pulse: 83  Temp: 97.7 F (36.5 C)  TempSrc: Oral  SpO2: 97%  Weight: 176 lb 12.8 oz (80.2 kg)  Height: _0  (1.651 m)   Body mass index is 29.42 kg/m.   Objective:  Physical Exam Vitals signs and nursing note reviewed.  Constitutional:      Appearance: Normal appearance.  HENT:     Head: Normocephalic and atraumatic.  Cardiovascular:     Rate and Rhythm: Normal rate and regular rhythm.     Pulses:          Dorsalis pedis pulses are 2+ on the right side and 2+ on the left side.     Heart sounds: Normal heart sounds.  Pulmonary:     Effort: Pulmonary effort is normal.     Breath sounds: Normal breath sounds.  Feet:     Right foot:     Protective Sensation: 5 sites tested. 5 sites sensed.     Skin integrity: Skin integrity normal.     Toenail Condition: Right toenails are normal.     Left foot:     Protective Sensation: 5 sites tested. 5 sites sensed.     Skin integrity: Skin integrity normal.     Toenail Condition: Left toenails are normal.  Skin:    General: Skin is warm.  Neurological:     General: No focal deficit present.     Mental Status: She is alert.  Psychiatric:        Mood and Affect: Mood normal.        Behavior: Behavior normal.         Assessment And Plan:     1. Uncontrolled type 2 diabetes mellitus with hyperglycemia (HCC)  Diabetic foot exam was performed.   Chronic, this has been well controlled. She will continue with current meds. She is encouraged to incorporate more exercise into her daily routine. I will check labs as listed below.  Additionally, I did complete her form - she is medically stable to be a foster parent.   - Hemoglobin A1c - BMP8+EGFR  2. Essential hypertension  Chronic, well controlled. She will continue with current meds. She is encouraged to  avoid adding salt to her foods.   3. Encounter for immunization  - Flu vaccine HIGH DOSE PF (Fluzone High dose)  4. Overweight (BMI 25.0-29.9)  She is encouraged to strive for BMI less than 25 to decrease cardiac risk.  She is encouraged to aim for 150 minutes of exercise per week.    [name], MD    THE PATIENT IS ENCOURAGED TO PRACTICE SOCIAL DISTANCING DUE TO THE COVID-19 PANDEMIC.

## 2019-03-20 ENCOUNTER — Encounter: Payer: Self-pay | Admitting: Internal Medicine

## 2019-03-22 ENCOUNTER — Other Ambulatory Visit: Payer: Self-pay | Admitting: Internal Medicine

## 2019-03-23 ENCOUNTER — Encounter: Payer: Self-pay | Admitting: *Deleted

## 2019-04-03 ENCOUNTER — Telehealth: Payer: Self-pay

## 2019-04-03 NOTE — Telephone Encounter (Signed)
ATT TO CONTACT PT TO SCHEDULE APPT FOR SURGICAL CLEARANCE W/SANDERS SPOKE W/SPOUSE STATED HE WOULD HAVE HER CALL OFC

## 2019-04-09 ENCOUNTER — Other Ambulatory Visit: Payer: Self-pay

## 2019-04-09 ENCOUNTER — Ambulatory Visit: Payer: Medicare Other | Admitting: Internal Medicine

## 2019-04-09 VITALS — BP 110/68 | HR 69 | Temp 98.4°F | Ht 63.8 in | Wt 173.0 lb

## 2019-04-09 DIAGNOSIS — M1711 Unilateral primary osteoarthritis, right knee: Secondary | ICD-10-CM

## 2019-04-09 DIAGNOSIS — I10 Essential (primary) hypertension: Secondary | ICD-10-CM

## 2019-04-09 DIAGNOSIS — Z01818 Encounter for other preprocedural examination: Secondary | ICD-10-CM | POA: Diagnosis not present

## 2019-04-09 DIAGNOSIS — E1165 Type 2 diabetes mellitus with hyperglycemia: Secondary | ICD-10-CM

## 2019-04-09 NOTE — Patient Instructions (Addendum)
DUE TO COVID-19 ONLY ONE VISITOR IS ALLOWED TO COME WITH YOU AND STAY IN THE WAITING ROOM ONLY DURING PRE OP AND PROCEDURE DAY OF SURGERY. THE 1 VISITOR MAY VISIT WITH YOU AFTER SURGERY IN YOUR PRIVATE ROOM DURING VISITING HOURS ONLY!  YOU NEED TO HAVE A COVID 19 TEST ON 04-13-19  @ 1:00 PM, THIS TEST MUST BE DONE BEFORE SURGERY, COME  [place], [place] , [address].  ([place]) ONCE YOUR COVID TEST IS COMPLETED, PLEASE BEGIN THE QUARANTINE INSTRUCTIONS AS OUTLINED IN YOUR HANDOUT.                Coleta Thai Gebre  04/09/2019   Your procedure is scheduled on: 04-17-19    Report to [facility] Main  Entrance    Report to Admitting at 8:00 AM     Call this number if you have problems the morning of surgery [phone]    Remember: AFTER MIDNIGHT  NOTHING BY MOUTH EXCEPT CLEAR LIQUIDS UNTIL 7:30 AM . PLEASE FINISH G2 DRINK PER SURGEON ORDER  WHICH NEEDS TO BE COMPLETED AT 7:30 AM .   CLEAR LIQUID DIET   Foods Allowed                                                                     Foods Excluded  Coffee and tea, regular and decaf                             liquids that you cannot  Plain Jell-O any favor except red or purple                                           see through such as: Fruit ices (not with fruit pulp)                                     milk, soups, orange juice  Iced Popsicles                                    All solid food Carbonated beverages, regular and diet                                    Cranberry, grape and apple juices Sports drinks like Gatorade Lightly seasoned clear broth or consume(fat free) Sugar, honey syrup   _____________________________________________________________________       Take these medicines the morning of surgery with A SIP OF WATER: Duloxetine (Cymbalta), Synthroid, Topamax, and you may use your eyedrops  BRUSH YOUR TEETH MORNING OF SURGERY AND RINSE YOUR MOUTH OUT, NO CHEWING  GUM CANDY OR MINTS.  DO NOT TAKE ANY DIABETIC MEDICATIONS DAY OF YOUR SURGERY  You may not have any metal on your body including hair pins and              piercings     Do not wear jewelry, make-up, lotions, powders or perfumes, deodorant             Do not wear nail polish on your fingernails.  Do not shave  48 hours prior to surgery.                Do not bring valuables to the hospital. [place].  Contacts, dentures or bridgework may not be worn into surgery.  You may bring and overnight bag       Special Instructions: N/A              Please read over the following fact sheets you were given: _____________________________________________________________________   How to Manage Your Diabetes Before and After Surgery  Why is it important to control my blood sugar before and after surgery? . Improving blood sugar levels before and after surgery helps healing and can limit problems. . A way of improving blood sugar control is eating a healthy diet by: o  Eating less sugar and carbohydrates o  Increasing activity/exercise o  Talking with your doctor about reaching your blood sugar goals . High blood sugars (greater than 180 mg/dL) can raise your risk of infections and slow your recovery, so you will need to focus on controlling your diabetes during the weeks before surgery. . Make sure that the doctor who takes care of your diabetes knows about your planned surgery including the date and location.  How do I manage my blood sugar before surgery? . Check your blood sugar at least 4 times a day, starting 2 days before surgery, to make sure that the level is not too high or low. o Check your blood sugar the morning of your surgery when you wake up and every 2 hours until you get to the Short Stay unit. . If your blood sugar is less than 70 mg/dL, you will need to treat for low blood sugar: o Do not take  insulin. o Treat a low blood sugar (less than 70 mg/dL) with  cup of clear juice (cranberry or apple), 4 glucose tablets, OR glucose gel. o Recheck blood sugar in 15 minutes after treatment (to make sure it is greater than 70 mg/dL). If your blood sugar is not greater than 70 mg/dL on recheck, call [phone] for further instructions. . Report your blood sugar to the short stay nurse when you get to Short Stay.  . If you are admitted to the hospital after surgery: o Your blood sugar will be checked by the staff and you will probably be given insulin after surgery (instead of oral diabetes medicines) to make sure you have good blood sugar levels. o The goal for blood sugar control after surgery is 80-180 mg/dL.   WHAT DO I DO ABOUT MY DIABETES MEDICATION?  Marland Kitchen Do not take oral diabetes medicines (pills) the morning of surgery.  . THE DAY BEFORE SURGERY, take your usual dose of Kombiglyze              Reviewed and Endorsed by [facility] Patient Education Committee, August 2015          [facility] - Preparing for Surgery Before surgery, you can play an important role.  Because skin is not sterile, your skin needs to be as free of germs as possible.  You can reduce the number of germs on your skin by washing with CHG (chlorahexidine gluconate) soap before surgery.  CHG is an antiseptic cleaner which kills germs and bonds with the skin to continue killing germs even after washing. Please DO NOT use if you have an allergy to CHG or antibacterial soaps.  If your skin becomes reddened/irritated stop using the CHG and inform your nurse when you arrive at Short Stay. Do not shave (including legs and underarms) for at least 48 hours prior to the first CHG shower.  You may shave your face/neck. Please follow these instructions carefully:  1.  Shower with CHG Soap the night before surgery and the  morning of Surgery.  2.  If you choose to wash your hair, wash your hair first as usual with your   normal  shampoo.  3.  After you shampoo, rinse your hair and body thoroughly to remove the  shampoo.                           4.  Use CHG as you would any other liquid soap.  You can apply chg directly  to the skin and wash                       Gently with a scrungie or clean washcloth.  5.  Apply the CHG Soap to your body ONLY FROM THE NECK DOWN.   Do not use on face/ open                           Wound or open sores. Avoid contact with eyes, ears mouth and genitals (private parts).                       Wash face,  Genitals (private parts) with your normal soap.             6.  Wash thoroughly, paying special attention to the area where your surgery  will be performed.  7.  Thoroughly rinse your body with warm water from the neck down.  8.  DO NOT shower/wash with your normal soap after using and rinsing off  the CHG Soap.                9.  Pat yourself dry with a clean towel.            10.  Wear clean pajamas.            11.  Place clean sheets on your bed the night of your first shower and do not  sleep with pets. Day of Surgery : Do not apply any lotions/deodorants the morning of surgery.  Please wear clean clothes to the hospital/surgery center.  FAILURE TO FOLLOW THESE INSTRUCTIONS MAY RESULT IN THE CANCELLATION OF YOUR SURGERY PATIENT SIGNATURE_________________________________  NURSE SIGNATURE__________________________________  ________________________________________________________________________   Adam Phenix  An incentive spirometer is a tool that can help keep your lungs clear and active. This tool measures how well you are filling your lungs with each breath. Taking long deep breaths may help reverse or decrease the chance of developing breathing (pulmonary) problems (especially infection) following:  A long period of time when you are unable to move or be active. BEFORE THE PROCEDURE   If  the spirometer includes an indicator to show your best effort, your  nurse or respiratory therapist will set it to a desired goal.  If possible, sit up straight or lean slightly forward. Try not to slouch.  Hold the incentive spirometer in an upright position. INSTRUCTIONS FOR USE  1. Sit on the edge of your bed if possible, or sit up as far as you can in bed or on a chair. 2. Hold the incentive spirometer in an upright position. 3. Breathe out normally. 4. Place the mouthpiece in your mouth and seal your lips tightly around it. 5. Breathe in slowly and as deeply as possible, raising the piston or the ball toward the top of the column. 6. Hold your breath for 3-5 seconds or for as long as possible. Allow the piston or ball to fall to the bottom of the column. 7. Remove the mouthpiece from your mouth and breathe out normally. 8. Rest for a few seconds and repeat Steps 1 through 7 at least 10 times every 1-2 hours when you are awake. Take your time and take a few normal breaths between deep breaths. 9. The spirometer may include an indicator to show your best effort. Use the indicator as a goal to work toward during each repetition. 10. After each set of 10 deep breaths, practice coughing to be sure your lungs are clear. If you have an incision (the cut made at the time of surgery), support your incision when coughing by placing a pillow or rolled up towels firmly against it. Once you are able to get out of bed, walk around indoors and cough well. You may stop using the incentive spirometer when instructed by your caregiver.  RISKS AND COMPLICATIONS  Take your time so you do not get dizzy or light-headed.  If you are in pain, you may need to take or ask for pain medication before doing incentive spirometry. It is harder to take a deep breath if you are having pain. AFTER USE  Rest and breathe slowly and easily.  It can be helpful to keep track of a log of your progress. Your caregiver can provide you with a simple table to help with this. If you are using the  spirometer at home, follow these instructions: [place] IF:   You are having difficultly using the spirometer.  You have trouble using the spirometer as often as instructed.  Your pain medication is not giving enough relief while using the spirometer.  You develop fever of 100.5 F (38.1 C) or higher. SEEK IMMEDIATE MEDICAL CARE IF:   You cough up bloody sputum that had not been present before.  You develop fever of 102 F (38.9 C) or greater.  You develop worsening pain at or near the incision site. MAKE SURE YOU:   Understand these instructions.  Will watch your condition.  Will get help right away if you are not doing well or get worse. Document Released: 10/25/2006 Document Revised: 09/06/2011 Document Reviewed: 12/26/2006 [facility] Patient Information 2014 [place], [place].   ________________________________________________________________________

## 2019-04-09 NOTE — Progress Notes (Signed)
PCP - Gl[facility]ale Chard Cardiologist -   Chest x-ray - 01-24-19  EKG - 08-24-18  Stress Test -  ECHO -  Cardiac Cath -   Sleep Study -  CPAP -   Fasting Blood Sugar -121 Checks Blood Sugar __0___ times a day  Blood Thinner Instructions: Aspirin Instructions: Last Dose:  Anesthesia review:   Patient denies shortness of breath, fever, cough and chest pain at PAT appointment   Patient verbalized understanding of instructions that were given to them at the PAT appointment. Patient was also instructed that they will need to review over the PAT instructions again at home before surgery.

## 2019-04-09 NOTE — Progress Notes (Addendum)
Subjective:     Patient ID: Christina Hernandez , female    DOB: 11-07-1950 , 67 y.o.   MRN: [id number]   Chief Complaint  Patient presents with  . Medical Clearance    HPI  She is here today for pre-operative clearance. She is scheduled for partial knee replacement next week. She denies cp, sob and palpitations.  She reports living in a two level home, and the Master bedroom is upstairs. She reports having no issues walking up/downstairs, with the exception of knee pain. She has no sob while bringing in groceries from the car. She is able to walk to her mailbox and back without any shortness of breath/chest pain.  She adds that she was cleared by Cardiology prior to her bre    Past Medical History:  Diagnosis Date  . Arthritis   . Breast cancer, right breast [facility])    pending right breast lumpectomy on September 01, 2018  . Diabetes mellitus type II, controlled ([place])    On Kombiglyze  . Family history of ovarian cancer   . Family history of thyroid cancer   . GERD (gastroesophageal reflux disease)    rarely occurs- no meds  . Hyperlipidemia associated with type 2 diabetes mellitus ([place])   . Hypertension   . Hypothyroidism   . Trigeminal neuralgia of left side of face 2015   s/p Gamma knife     Family History  Problem Relation Age of Onset  . Hypertension Mother   . Kidney failure Mother   . Congestive Heart Failure Mother   . Heart Problems Father   . Stroke Father   . Pneumonia Father   . Heart Problems Sister 40       heart transplant  . Stroke Paternal Grandmother   . Dementia Paternal Grandmother   . Stroke Paternal Grandfather   . Cardiomyopathy Sister   . Ovarian cancer Sister 54       d. 15  . Thyroid disease Sister   . Thyroid disease Sister   . Thyroid cancer Niece 60  . Breast cancer Neg Hx      Current Outpatient Medications:  .  acetaminophen (TYLENOL) 500 MG tablet, Take 1 tablet (500 mg total) by mouth every 8 (eight) hours as needed. (Patient  taking differently: Take 500 mg by mouth every 8 (eight) hours as needed for moderate pain or headache. ), Disp: 30 tablet, Rfl: 1 .  baclofen (LIORESAL) 10 MG tablet, Take 10 mg by mouth 3 (three) times daily. , Disp: , Rfl:  .  Biotin (BIOTIN MAXIMUM) 10000 MCG TBDP, Take 10,000 mcg by mouth daily. , Disp: , Rfl:  .  Cholecalciferol (VITAMIN D) 125 MCG (5000 UT) CAPS, Take 5,000 Units by mouth daily., Disp: , Rfl:  .  COMBIGAN 0.2-0.5 % ophthalmic solution, Place 1 drop into both eyes 2 (two) times daily., Disp: , Rfl:  .  DULoxetine (CYMBALTA) 60 MG capsule, Take 1 capsule (60 mg total) by mouth daily., Disp: 90 capsule, Rfl: 4 .  gabapentin (NEURONTIN) 300 MG capsule, Take 3 capsules (900 mg total) by mouth 3 (three) times daily., Disp: 810 capsule, Rfl: 4 .  KOMBIGLYZE XR 5-500 MG TB24, TAKE 1 TABLET BY MOUTH DAILY BEFORE SUPPER. (Patient taking differently: Take 1 tablet by mouth daily before supper. ), Disp: 90 tablet, Rfl: 1 .  latanoprost (XALATAN) 0.005 % ophthalmic solution, Place 1 drop into both eyes at bedtime., Disp: , Rfl:  .  losartan-hydrochlorothiazide (HYZAAR) 50-12.5 MG tablet,  TAKE 1 TABLET BY MOUTH EVERY DAY (Patient taking differently: Take 1 tablet by mouth daily. ), Disp: 90 tablet, Rfl: 1 .  meloxicam (MOBIC) 15 MG tablet, Take 15 mg by mouth daily., Disp: , Rfl:  .  pravastatin (PRAVACHOL) 40 MG tablet, TAKE 1 TABLET BY MOUTH EVERY DAY (Patient taking differently: Take 40 mg by mouth daily. ), Disp: 90 tablet, Rfl: 2 .  SYNTHROID 75 MCG tablet, TAKE 1 TABLET DAILY MON-SAT, HOLD ON SUNDAY. (Patient taking differently: Take 75 mcg by mouth See admin instructions. Take 75 mcg by mouth daily Monday - Saturday and hold on Sunday), Disp: 26 tablet, Rfl: 2 .  topiramate (TOPAMAX) 50 MG tablet, Take 100 mg by mouth 2 (two) times daily. , Disp: , Rfl:  .  cetirizine (ZYRTEC) 10 MG tablet, Take 1 tablet (10 mg total) by mouth daily. (Patient not taking: Reported on 04/06/2019), Disp:  30 tablet, Rfl: 3 .  HYDROcodone-acetaminophen (NORCO/VICODIN) 5-325 MG tablet, Take 1 tablet by mouth every 6 (six) hours as needed for moderate pain. (Patient not taking: Reported on 02/27/2019), Disp: 12 tablet, Rfl: 0   Allergies  Allergen Reactions  . Cyclobenzaprine Nausea Only     Review of Systems  Constitutional: Negative.   Respiratory: Negative.   Cardiovascular: Negative.   Gastrointestinal: Negative.   Musculoskeletal: Positive for arthralgias.  Neurological: Negative.   Psychiatric/Behavioral: Negative.      Today's Vitals   04/09/19 1521  BP: 110/68  Pulse: 69  Temp: 98.4 F (36.9 C)  TempSrc: Oral  SpO2: 96%  Weight: 173 lb (78.5 kg)  Height: 5' 3.8" (1.621 m)   Body mass index is 29.88 kg/m.   Objective:  Physical Exam Vitals signs and nursing note reviewed.  Constitutional:      Appearance: Normal appearance.  HENT:     Head: Normocephalic and atraumatic.  Cardiovascular:     Rate and Rhythm: Normal rate and regular rhythm.     Heart sounds: Normal heart sounds.  Pulmonary:     Effort: Pulmonary effort is normal.     Breath sounds: Normal breath sounds.  Skin:    General: Skin is warm.  Neurological:     General: No focal deficit present.     Mental Status: She is alert.  Psychiatric:        Mood and Affect: Mood normal.        Behavior: Behavior normal.         Assessment And Plan:     1. Pre-operative clearance  Pt advised that I am able to medically clear her for surgery, with acceptable risk. I also reached out to Dr. Ellyn Hack, her cardiologist for his input. His Feb and June 2020 notes were reviewed in full detail and discussed with the patient.   2. Uncontrolled type 2 diabetes mellitus with hyperglycemia (HCC)  Chronic, well controlled. Her hba1c is 6.1, great control.   3. Essential hypertension  Chronic, well controlled. She will continue with current meds. Previous EKGs reviewed.  She is encouraged to avoid adding salt to  her foods.   4. Primary osteoarthritis of right knee  She is scheduled for partial knee replacement on 04/17/2019.   [name], MD    THE PATIENT IS ENCOURAGED TO PRACTICE SOCIAL DISTANCING DUE TO THE COVID-19 PANDEMIC.

## 2019-04-10 ENCOUNTER — Encounter (HOSPITAL_COMMUNITY): Payer: Self-pay

## 2019-04-10 ENCOUNTER — Other Ambulatory Visit: Payer: Self-pay

## 2019-04-10 ENCOUNTER — Encounter (HOSPITAL_COMMUNITY)
Admission: RE | Admit: 2019-04-10 | Discharge: 2019-04-10 | Disposition: A | Discharge status: Home / Self Care | Payer: Medicare Other | Source: Ambulatory Visit | Attending: Orthopedic Surgery | Admitting: Orthopedic Surgery

## 2019-04-10 ENCOUNTER — Inpatient Hospital Stay: Payer: Medicare Other | Attending: Oncology | Admitting: Oncology

## 2019-04-10 VITALS — BP 117/64 | HR 99 | Temp 98.0°F | Resp 18 | Ht 64.0 in | Wt 173.4 lb

## 2019-04-10 DIAGNOSIS — E119 Type 2 diabetes mellitus without complications: Secondary | ICD-10-CM | POA: Insufficient documentation

## 2019-04-10 DIAGNOSIS — Z79811 Long term (current) use of aromatase inhibitors: Secondary | ICD-10-CM | POA: Diagnosis not present

## 2019-04-10 DIAGNOSIS — E785 Hyperlipidemia, unspecified: Secondary | ICD-10-CM | POA: Insufficient documentation

## 2019-04-10 DIAGNOSIS — Z170 Estrogen receptor positive status [ER+]: Secondary | ICD-10-CM | POA: Diagnosis not present

## 2019-04-10 DIAGNOSIS — Z808 Family history of malignant neoplasm of other organs or systems: Secondary | ICD-10-CM | POA: Diagnosis not present

## 2019-04-10 DIAGNOSIS — D0511 Intraductal carcinoma in situ of right breast: Secondary | ICD-10-CM | POA: Diagnosis not present

## 2019-04-10 DIAGNOSIS — Z791 Long term (current) use of non-steroidal anti-inflammatories (NSAID): Secondary | ICD-10-CM | POA: Insufficient documentation

## 2019-04-10 DIAGNOSIS — Z79899 Other long term (current) drug therapy: Secondary | ICD-10-CM | POA: Diagnosis not present

## 2019-04-10 DIAGNOSIS — C50911 Malignant neoplasm of unspecified site of right female breast: Secondary | ICD-10-CM | POA: Insufficient documentation

## 2019-04-10 DIAGNOSIS — E039 Hypothyroidism, unspecified: Secondary | ICD-10-CM | POA: Diagnosis not present

## 2019-04-10 DIAGNOSIS — R918 Other nonspecific abnormal finding of lung field: Secondary | ICD-10-CM | POA: Insufficient documentation

## 2019-04-10 DIAGNOSIS — I10 Essential (primary) hypertension: Secondary | ICD-10-CM | POA: Insufficient documentation

## 2019-04-10 DIAGNOSIS — Z01818 Encounter for other preprocedural examination: Secondary | ICD-10-CM | POA: Diagnosis not present

## 2019-04-10 DIAGNOSIS — F329 Major depressive disorder, single episode, unspecified: Secondary | ICD-10-CM | POA: Insufficient documentation

## 2019-04-10 DIAGNOSIS — Z8041 Family history of malignant neoplasm of ovary: Secondary | ICD-10-CM | POA: Diagnosis not present

## 2019-04-10 LAB — HEMOGLOBIN A1C
Hgb A1c MFr Bld: 6.1 % — ABNORMAL HIGH (ref 4.8–5.6)
Mean Plasma Glucose: 128.37 mg/dL

## 2019-04-10 LAB — SURGICAL PCR SCREEN
MRSA, PCR: NEGATIVE
Staphylococcus aureus: POSITIVE — AB

## 2019-04-10 LAB — CBC
HCT: 38.3 % (ref 36.0–46.0)
Hemoglobin: 12 g/dL (ref 12.0–15.0)
MCH: 27.3 pg (ref 26.0–34.0)
MCHC: 31.3 g/dL (ref 30.0–36.0)
MCV: 87 fL (ref 80.0–100.0)
Platelets: 268 10*3/uL (ref 150–400)
RBC: 4.4 MIL/uL (ref 3.87–5.11)
RDW: 13.5 % (ref 11.5–15.5)
WBC: 6 10*3/uL (ref 4.0–10.5)
nRBC: 0 % (ref 0.0–0.2)

## 2019-04-10 LAB — GLUCOSE, CAPILLARY: Glucose-Capillary: 121 mg/dL — ABNORMAL HIGH (ref 70–99)

## 2019-04-10 LAB — BASIC METABOLIC PANEL
Anion gap: 9 (ref 5–15)
BUN: 20 mg/dL (ref 8–23)
CO2: 25 mmol/L (ref 22–32)
Calcium: 9.4 mg/dL (ref 8.9–10.3)
Chloride: 106 mmol/L (ref 98–111)
Creatinine, Ser: 0.76 mg/dL (ref 0.44–1.00)
GFR calc Af Amer: >60 mL/min (ref >60)
GFR calc non Af Amer: >60 mL/min (ref >60)
Glucose, Bld: 95 mg/dL (ref 70–99)
Potassium: 3 mmol/L — ABNORMAL LOW (ref 3.5–5.1)
Sodium: 140 mmol/L (ref 135–145)

## 2019-04-10 MED ORDER — ANASTROZOLE 1 MG PO TABS: 1.0000 mg | ORAL_TABLET | Freq: Every day | ORAL | 4 refills | Status: DC

## 2019-04-10 NOTE — Progress Notes (Signed)
[place]  [phone] [phone]     ID: Christina Hernandez DOB: 70-04-1951  MR#: [id number]  YIR#:[id number]  Patient Care Team: Glendale Chard, MD as PCP - General (Internal Medicine) [name], MD as PCP - Cardiology (Cardiology) [name], MD as Consulting Physician (Obstetrics and Gynecology) [name], MD as Consulting Physician (General Surgery) Kora Groom, [name], MD as Consulting Physician (Oncology) [name], MD as Consulting Physician (Radiation Oncology) [name], Earlean Polka, MD as Consulting Physician (Neurology) [name], MD as Consulting Physician (Pulmonary Disease) Chauncey Cruel, MD OTHER MD:   CHIEF COMPLAINT: Estrogen receptor positive breast cancer  CURRENT TREATMENT: Anastrozole   INTERVAL HISTORY: Christina Hernandez returns today for follow-up and treatment of her estrogen receptor positive breast cancer. She was last seen here on 11/27/2018.   She continues under observation. Since her last visit here, she underwent a chest CT without contrast on 01/24/2019 showing: Stable 6 mm right upper lobe lung nodule. This has remained unchanged since 08/15/2016 reflecting greater than 24 months of stability compatible with a benign abnormality. This recommendation follows the consensus statement: Guidelines for Management of Small Pulmonary Nodules Detected on CT Images: From the Fleischner Society 2017; Radiology 2017; [id number]:[id number]. Emphysema (ICD10-J43.9). Hepatic steatosis.  Her last mammogram was on 07/21/2018.   Is scheduled for partial knee replacement next week under Dr. [name]   REVIEW OF SYSTEMS: Christina Hernandez is having more pain in the right knee and she is hoping the surgery she will have next week will help.  She is able to walk but feels a little wobbly she thinks because of the medication she takes for pain.  She continues to have problems with her left 5th cranial nerve trauma that she suffered she  says from having a tooth pulled several years ago.  Aside from that she and her husband are being careful regarding the current pandemic.  A detailed review of systems was otherwise stable.   HISTORY OF CURRENT ILLNESS: From the original intake note:  Christina Hernandez had routine screening mammography on 07/10/2018 showing a possible abnormality in the right breast. She underwent bilateral diagnostic mammography with tomography and right breast ultrasonography at [facility] on 07/21/2018 showing: indeterminate mass at the 3:30 position of the right breast measuring 3 x 4 x 4 mm; normal right axilla.  Accordingly on 07/24/2018 she proceeded to biopsy of the right breast area in question. The pathology ([id number]) from this procedure showed: ductal carcinoma in situ, low grade, may be involving an underlying papillary lesion. Prognostic indicators significant for: estrogen receptor, 100% positive and progesterone receptor, 100% positive or negative, both with strong staining intensity.   The patient's subsequent history is as detailed below.   PAST MEDICAL HISTORY: Past Medical History:  Diagnosis Date   Arthritis    Breast cancer, right breast ([place])    pending right breast lumpectomy on September 01, 2018   Diabetes mellitus type II, controlled ([place])    On Kombiglyze   Family history of ovarian cancer    Family history of thyroid cancer    GERD (gastroesophageal reflux disease)    rarely occurs- no meds   Hyperlipidemia associated with type 2 diabetes mellitus ([place])    Hypertension    Hypothyroidism    Trigeminal neuralgia of left side of face 2015   s/p Gamma knife  She has a stable lung nodule   PAST SURGICAL HISTORY: Past Surgical History:  Procedure Laterality Date   BREAST LUMPECTOMY WITH RADIOACTIVE  SEED LOCALIZATION Right 09/01/2018   Procedure: RIGHT BREAST LUMPECTOMY WITH RADIOACTIVE SEED LOCALIZATION;  Surgeon: [name], MD;  Location: [facility];  Service: General;  Laterality: Right;   COLONOSCOPY     Gamma Knife Left 04/08/2016   HYSTEROSCOPY W/D&C  05/24/2011   Procedure: DILATATION AND CURETTAGE (D&C) /HYSTEROSCOPY;  Surgeon: [name], MD;  Location: [facility];  Service: Gynecology;  Laterality: N/A;   KNEE SURGERY Right 06/10/15   torn ligament   THYROIDECTOMY N/A 09/08/2016   Procedure: TOTAL THYROIDECTOMY;  Surgeon: [name], MD;  Location: [facility];  Service: ENT;  Laterality: N/A;   TUBAL LIGATION     uterine polyp     WISDOM TOOTH EXTRACTION Left 2014    FAMILY HISTORY Family History  Problem Relation Age of Onset   Hypertension Mother    Kidney failure Mother    Congestive Heart Failure Mother    Heart Problems Father    Stroke Father    Pneumonia Father    Heart Problems Sister 81       heart transplant   Stroke Paternal Grandmother    Dementia Paternal Grandmother    Stroke Paternal Grandfather    Cardiomyopathy Sister    Ovarian cancer Sister 71       d. 4   Thyroid disease Sister    Thyroid disease Sister    Thyroid cancer Niece 19   Breast cancer Neg Hx   (as of 08/02/2018) Patient father was 73 years old when he died from pneumonia and a stroke. Patient mother died from possible heart attack at age 68.  The patient denies a family hx of breast cancer. A sister was diagnosed with ovarian cancer, and has since passed away at age 28.  The patient has 5 siblings, 5 sisters and 0 brothers.   GYNECOLOGIC HISTORY:  No LMP recorded. Patient is postmenopausal. Menarche: 68 years old Age at first live birth: 68 years old [place] P 4 LMP 1996 Contraceptive yes HRT no  Hysterectomy? Yes, 5-6 years ago (per patient) BSO? 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 Husband Christina Hernandez is a retired English as a second language teacher. 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 Son Christina Hernandez, age 48, is a Nature conservation officer in [place]. Daughter Christina Hernandez,  age 1, works as a Software engineer for [employer]. Son Christina Hernandez, age 72, works in Land in [place].  The patient has no grandchildren.  She belongs to [employer], and she is Information systems manager.    ADVANCED DIRECTIVES: Husband Christina Hernandez is her HCPOA.   HEALTH MAINTENANCE: Social History   Tobacco Use   Smoking status: Never Smoker   Smokeless tobacco: Never Used  Substance Use Topics   Alcohol use: No   Drug use: No     Colonoscopy: 2019, Dr. Collene Mares  PAP: 03/21/2011, normal  Bone density: unknown   Allergies  Allergen Reactions   Cyclobenzaprine Nausea Only    Current Outpatient Medications  Medication Sig Dispense Refill   acetaminophen (TYLENOL) 500 MG tablet Take 1 tablet (500 mg total) by mouth every 8 (eight) hours as needed. (Patient taking differently: Take 500 mg by mouth every 8 (eight) hours as needed for moderate pain or headache. ) 30 tablet 1   anastrozole (ARIMIDEX) 1 MG tablet Take 1 tablet (1 mg total) by mouth daily. Start Apr 29, 2019 90 tablet 4   baclofen (LIORESAL) 10 MG tablet Take 10 mg by mouth 3 (  three) times daily.      Biotin (BIOTIN MAXIMUM) 10000 MCG TBDP Take 10,000 mcg by mouth daily.      cetirizine (ZYRTEC) 10 MG tablet Take 1 tablet (10 mg total) by mouth daily. (Patient not taking: Reported on 04/06/2019) 30 tablet 3   Cholecalciferol (VITAMIN D) 125 MCG (5000 UT) CAPS Take 5,000 Units by mouth daily.     COMBIGAN 0.2-0.5 % ophthalmic solution Place 1 drop into both eyes 2 (two) times daily.     DULoxetine (CYMBALTA) 60 MG capsule Take 1 capsule (60 mg total) by mouth daily. 90 capsule 4   gabapentin (NEURONTIN) 300 MG capsule Take 3 capsules (900 mg total) by mouth 3 (three) times daily. 810 capsule 4   HYDROcodone-acetaminophen (NORCO/VICODIN) 5-325 MG tablet Take 1 tablet by mouth every 6 (six) hours as needed for moderate pain. (Patient not taking: Reported on 02/27/2019) 12 tablet 0   KOMBIGLYZE XR 5-500 MG TB24 TAKE 1 TABLET BY  MOUTH DAILY BEFORE SUPPER. (Patient taking differently: Take 1 tablet by mouth daily before supper. ) 90 tablet 1   latanoprost (XALATAN) 0.005 % ophthalmic solution Place 1 drop into both eyes at bedtime.     losartan-hydrochlorothiazide (HYZAAR) 50-12.5 MG tablet TAKE 1 TABLET BY MOUTH EVERY DAY (Patient taking differently: Take 1 tablet by mouth daily. ) 90 tablet 1   meloxicam (MOBIC) 15 MG tablet Take 15 mg by mouth daily.     pravastatin (PRAVACHOL) 40 MG tablet TAKE 1 TABLET BY MOUTH EVERY DAY (Patient taking differently: Take 40 mg by mouth daily. ) 90 tablet 2   SYNTHROID 75 MCG tablet TAKE 1 TABLET DAILY MON-SAT, HOLD ON SUNDAY. (Patient taking differently: Take 75 mcg by mouth See admin instructions. Take 75 mcg by mouth daily Monday - Saturday and hold on Sunday) 26 tablet 2   topiramate (TOPAMAX) 50 MG tablet Take 100 mg by mouth 2 (two) times daily.      No current facility-administered medications for this visit.     OBJECTIVE: Middle-aged African-American woman in no acute distress  Vitals:   04/10/19 1130  BP: 117/64  Pulse: 99  Resp: 18  Temp: 98 F (36.7 C)  SpO2: 99%   Wt Readings from Last 3 Encounters:  04/10/19 173 lb 6.4 oz (78.7 kg)  04/10/19 173 lb 4 oz (78.6 kg)  04/09/19 173 lb (78.5 kg)   Body mass index is 29.76 kg/m.    ECOG FS:1 - Symptomatic but completely ambulatory  Ocular: Sclerae unicteric, pupils round and equal Ear-nose-throat: Wearing a mask Lymphatic: No cervical or supraclavicular adenopathy Lungs no rales or rhonchi Heart regular rate and rhythm Abd soft, nontender, positive bowel sounds MSK no focal spinal tenderness, no joint edema Neuro: non-focal, well-oriented, appropriate affect Breasts: The right breast is status post lumpectomy and radiation.  There is minimal minimal hyperpigmentation remaining.  The left breast is benign.  Both axillae are benign.   LAB RESULTS:  CMP     Component Value Date/Time   NA 143  02/27/2019 1257   K 3.6 02/27/2019 1257   CL 109 (H) 02/27/2019 1257   CO2 19 (L) 02/27/2019 1257   GLUCOSE 128 (H) 02/27/2019 1257   GLUCOSE 109 (H) 08/02/2018 0835   BUN 11 02/27/2019 1257   CREATININE 0.68 02/27/2019 1257   CREATININE 0.77 08/02/2018 0835   CALCIUM 9.6 02/27/2019 1257   PROT 6.6 12/12/2018 1217   ALBUMIN 4.1 12/12/2018 1217   AST 20 12/12/2018 1217  AST 18 08/02/2018 0835   ALT 16 12/12/2018 1217   ALT 22 08/02/2018 0835   ALKPHOS 91 12/12/2018 1217   BILITOT 0.5 12/12/2018 1217   BILITOT 0.7 08/02/2018 0835   GFRNONAA 90 02/27/2019 1257   GFRNONAA >60 08/02/2018 0835   GFRAA 103 02/27/2019 1257   GFRAA >60 08/02/2018 0835    No results found for: TOTALPROTELP, ALBUMINELP, A1GS, A2GS, BETS, BETA2SER, GAMS, MSPIKE, SPEI  No results found for: KPAFRELGTCHN, LAMBDASER, KAPLAMBRATIO  Lab Results  Component Value Date   WBC 6.0 04/10/2019   NEUTROABS 2.8 08/02/2018   HGB 12.0 04/10/2019   HCT 38.3 04/10/2019   MCV 87.0 04/10/2019   PLT 268 04/10/2019    '@LASTCHEMISTRY' @  No results found for: LABCA2  No components found for: ACZYSA630  No results for input(s): INR in the last 168 hours.  No results found for: LABCA2  No results found for: ZSW109  No results found for: NAT557  No results found for: DUK025  No results found for: CA2729  No components found for: HGQUANT  No results found for: CEA1 / No results found for: CEA1   No results found for: AFPTUMOR  No results found for: [place]  No results found for: [facility] Outpatient Visit on 04/10/2019  Component Date Value Ref Range Status   Glucose-Capillary 04/10/2019 121* 70 - 99 mg/dL Final   WBC 04/10/2019 6.0  4.0 - 10.5 K/uL Final   RBC 04/10/2019 4.40  3.87 - 5.11 MIL/uL Final   Hemoglobin 04/10/2019 12.0  12.0 - 15.0 g/dL Final   HCT 04/10/2019 38.3  36.0 - 46.0 % Final   MCV 04/10/2019 87.0  80.0 - 100.0 fL Final   MCH 04/10/2019 27.3  26.0 - 34.0 pg Final    MCHC 04/10/2019 31.3  30.0 - 36.0 g/dL Final   RDW 04/10/2019 13.5  11.5 - 15.5 % Final   Platelets 04/10/2019 268  150 - 400 K/uL Final   nRBC 04/10/2019 0.0  0.0 - 0.2 % Final   Performed at [facility], [name] [place], [place] [address]    (this displays the last labs from the last 3 days)  No results found for: TOTALPROTELP, ALBUMINELP, A1GS, A2GS, BETS, BETA2SER, GAMS, MSPIKE, SPEI (this displays SPEP labs)  No results found for: KPAFRELGTCHN, LAMBDASER, KAPLAMBRATIO (kappa/lambda light chains)  No results found for: HGBA, HGBA2QUANT, HGBFQUANT, HGBSQUAN (Hemoglobinopathy evaluation)   No results found for: LDH  No results found for: IRON, TIBC, IRONPCTSAT (Iron and TIBC)  No results found for: FERRITIN  Urinalysis    Component Value Date/Time   COLORURINE YELLOW 08/17/2007 1404   APPEARANCEUR CLEAR 08/17/2007 1404   LABSPEC 1.010 08/17/2007 1404   PHURINE 7.0 08/17/2007 1404   GLUCOSEU NEGATIVE 08/17/2007 1404   HGBUR NEGATIVE 08/17/2007 1404   BILIRUBINUR negative 03/08/2019 1234   KETONESUR negative 04/12/2016 1147   KETONESUR NEGATIVE 08/17/2007 1404   PROTEINUR Negative 03/08/2019 1234   PROTEINUR NEGATIVE 08/17/2007 1404   UROBILINOGEN 0.2 03/08/2019 1234   UROBILINOGEN 0.2 08/17/2007 1404   NITRITE negative 03/08/2019 1234   NITRITE NEGATIVE 08/17/2007 1404   LEUKOCYTESUR Negative 03/08/2019 1234     STUDIES: No results found.  ELIGIBLE FOR AVAILABLE RESEARCH PROTOCOL: no  ASSESSMENT: 68 y.o. [place] woman status post right breast biopsy 07/24/2018 for ductal carcinoma in situ, grade 1, strongly estrogen and progesterone receptor positive.  (1) status post right lumpectomy 09/01/2018 for ductal carcinoma in situ, 5 mm, with negative margins  (  2) adjuvant radiation 10/16/2018 - 11/10/2018  (a) Right Breast was treated to 42.56 Gy in 16 fractions, followed by a boost of 8 Gy in 4 fractions.  (3) to start  anastrozole 04/29/2019  (4) genetics testing on 08/21/2018  (a) genetic testing 08/27/2018 on the STAT panel and the common hereditary cancer gene paneloffered by [facility] found no deleterious mutations in APC, ATM, AXIN2, BARD1, BMPR1A, BRCA1, BRCA2, BRIP1, CDH1, CDK4, CDKN2A (p14ARF), CDKN2A (p16INK4a), CHEK2, CTNNA1, DICER1, EPCAM (Deletion/duplication testing only), GREM1 (promoter region deletion/duplication testing only), KIT, MEN1, MLH1, MSH2, MSH3, MSH6, MUTYH, NBN, NF1, NHTL1, PALB2, PDGFRA, PMS2, POLD1, POLE, PTEN, RAD50, RAD51C, RAD51D, SDHB, SDHC, SDHD, SMAD4, SMARCA4. STK11, TP53, TSC1, TSC2, and VHL.  The following genes were evaluated for sequence changes only: SDHA and HOXB13 c.251G>A variant only.     PLAN: Carlus Pavlov did very well with local treatment for her breast cancer.  She has now ready to consider antiestrogens.  We discussed the difference between tamoxifen and anastrozole in detail. She understands that anastrozole and the aromatase inhibitors in general work by blocking estrogen production. Accordingly vaginal dryness, decrease in bone density, and of course hot flashes can result. The aromatase inhibitors can also negatively affect the cholesterol profile, although that is a minor effect. One out of 5 women on aromatase inhibitors we will feel "old and achy". This arthralgia/myalgia syndrome, which resembles fibromyalgia clinically, does resolve with stopping the medications. Accordingly this is not a reason to not try an aromatase inhibitor but it is a frequent reason to stop it (in other words 20% of women will not be able to tolerate these medications).  Tamoxifen on the other hand does not block estrogen production. It does not "take away a woman's estrogen". It blocks the estrogen receptor in breast cells. Like anastrozole, it can also cause hot flashes. As opposed to anastrozole, tamoxifen has many estrogen-like effects. It is technically an estrogen receptor modulator. This  means that in some tissues tamoxifen works like estrogen-- for example it helps strengthen the bones. It tends to improve the cholesterol profile. It can cause thickening of the endometrial lining, and even endometrial polyps or rarely cancer of the uterus.(The risk of uterine cancer due to tamoxifen is one additional cancer per thousand women year). It can cause vaginal wetness or stickiness. It can cause blood clots through this estrogen-like effect--the risk of blood clots with tamoxifen is exactly the same as with birth control pills or hormone replacement.  Neither of these agents causes mood changes or weight gain, despite the popular belief that they can have these side effects. We have data from studies comparing either of these drugs with placebo, and in those cases the control group had the same amount of weight gain and depression as the group that took the drug.  Given all this she is interested in anastrozole and gone ahead and placed the prescription in for her.  Since she is having surgery next week it may be better to wait until her symptoms have improved so the target start date is April 29, 2019.  She is due for mammography next January.  I will see her shortly after that.  If she tolerates anastrozole well the plan will be to continue that a total of 5 years and at that point we would start discussing a bone health issues.   Recall for any other issue that may develop before the next visit.   Anthone Prieur, [name], MD  04/10/19 12:24 PM Medical Oncology and Hematology  [facility] [place], [place] [address] Tel. [phone]    Fax. [phone]  I, Jacqualyn Posey am acting as a Education administrator for Chauncey Cruel, MD.   I, Lurline Del MD, have reviewed the above documentation for accuracy and completeness, and I agree with the above.

## 2019-04-10 NOTE — Progress Notes (Signed)
PCR results routed to Dr. [name] for review

## 2019-04-12 ENCOUNTER — Telehealth: Payer: Self-pay | Admitting: Oncology

## 2019-04-12 NOTE — Telephone Encounter (Signed)
I could not reach patient regarding schedule  °

## 2019-04-13 ENCOUNTER — Other Ambulatory Visit (HOSPITAL_COMMUNITY)
Admission: RE | Admit: 2019-04-13 | Discharge: 2019-04-13 | Disposition: A | Discharge status: Home / Self Care | Payer: Medicare Other | Source: Ambulatory Visit | Attending: Orthopedic Surgery | Admitting: Orthopedic Surgery

## 2019-04-13 DIAGNOSIS — Z01812 Encounter for preprocedural laboratory examination: Secondary | ICD-10-CM | POA: Diagnosis present

## 2019-04-13 DIAGNOSIS — Z20828 Contact with and (suspected) exposure to other viral communicable diseases: Secondary | ICD-10-CM | POA: Diagnosis not present

## 2019-04-14 LAB — NOVEL CORONAVIRUS, NAA (HOSP ORDER, SEND-OUT TO REF LAB; TAT 18-24 HRS): SARS-CoV-2, NAA: NOT DETECTED

## 2019-04-17 ENCOUNTER — Observation Stay (HOSPITAL_COMMUNITY): Payer: Medicare Other

## 2019-04-17 ENCOUNTER — Ambulatory Visit (HOSPITAL_COMMUNITY): Payer: Medicare Other | Admitting: Physician Assistant

## 2019-04-17 ENCOUNTER — Observation Stay (HOSPITAL_COMMUNITY)
Admission: RE | Admit: 2019-04-17 | Discharge: 2019-04-18 | Disposition: A | Discharge status: Home / Self Care | Payer: Medicare Other | Attending: Orthopedic Surgery | Admitting: Orthopedic Surgery

## 2019-04-17 ENCOUNTER — Encounter (HOSPITAL_COMMUNITY): Payer: Self-pay | Admitting: Orthopedic Surgery

## 2019-04-17 ENCOUNTER — Telehealth (HOSPITAL_COMMUNITY): Payer: Self-pay | Admitting: *Deleted

## 2019-04-17 ENCOUNTER — Encounter (HOSPITAL_COMMUNITY)
Admission: RE | Disposition: A | Discharge status: Home / Self Care | Payer: Self-pay | Source: Home / Self Care | Attending: Orthopedic Surgery

## 2019-04-17 ENCOUNTER — Other Ambulatory Visit: Payer: Self-pay

## 2019-04-17 DIAGNOSIS — Z836 Family history of other diseases of the respiratory system: Secondary | ICD-10-CM | POA: Diagnosis not present

## 2019-04-17 DIAGNOSIS — Z8349 Family history of other endocrine, nutritional and metabolic diseases: Secondary | ICD-10-CM | POA: Insufficient documentation

## 2019-04-17 DIAGNOSIS — Z853 Personal history of malignant neoplasm of breast: Secondary | ICD-10-CM | POA: Diagnosis not present

## 2019-04-17 DIAGNOSIS — Z7982 Long term (current) use of aspirin: Secondary | ICD-10-CM | POA: Insufficient documentation

## 2019-04-17 DIAGNOSIS — Z96659 Presence of unspecified artificial knee joint: Secondary | ICD-10-CM

## 2019-04-17 DIAGNOSIS — Z7901 Long term (current) use of anticoagulants: Secondary | ICD-10-CM | POA: Diagnosis not present

## 2019-04-17 DIAGNOSIS — E119 Type 2 diabetes mellitus without complications: Secondary | ICD-10-CM | POA: Diagnosis not present

## 2019-04-17 DIAGNOSIS — K219 Gastro-esophageal reflux disease without esophagitis: Secondary | ICD-10-CM | POA: Diagnosis not present

## 2019-04-17 DIAGNOSIS — E890 Postprocedural hypothyroidism: Secondary | ICD-10-CM | POA: Insufficient documentation

## 2019-04-17 DIAGNOSIS — Z820 Family history of epilepsy and other diseases of the nervous system: Secondary | ICD-10-CM | POA: Diagnosis not present

## 2019-04-17 DIAGNOSIS — Z96651 Presence of right artificial knee joint: Secondary | ICD-10-CM

## 2019-04-17 DIAGNOSIS — Z791 Long term (current) use of non-steroidal anti-inflammatories (NSAID): Secondary | ICD-10-CM | POA: Insufficient documentation

## 2019-04-17 DIAGNOSIS — M94261 Chondromalacia, right knee: Secondary | ICD-10-CM | POA: Insufficient documentation

## 2019-04-17 DIAGNOSIS — Z7984 Long term (current) use of oral hypoglycemic drugs: Secondary | ICD-10-CM | POA: Insufficient documentation

## 2019-04-17 DIAGNOSIS — Z79899 Other long term (current) drug therapy: Secondary | ICD-10-CM | POA: Diagnosis not present

## 2019-04-17 DIAGNOSIS — Z823 Family history of stroke: Secondary | ICD-10-CM | POA: Diagnosis not present

## 2019-04-17 DIAGNOSIS — Z8041 Family history of malignant neoplasm of ovary: Secondary | ICD-10-CM | POA: Insufficient documentation

## 2019-04-17 DIAGNOSIS — E785 Hyperlipidemia, unspecified: Secondary | ICD-10-CM | POA: Insufficient documentation

## 2019-04-17 DIAGNOSIS — Z808 Family history of malignant neoplasm of other organs or systems: Secondary | ICD-10-CM | POA: Insufficient documentation

## 2019-04-17 DIAGNOSIS — M1711 Unilateral primary osteoarthritis, right knee: Principal | ICD-10-CM

## 2019-04-17 DIAGNOSIS — Z8249 Family history of ischemic heart disease and other diseases of the circulatory system: Secondary | ICD-10-CM | POA: Diagnosis not present

## 2019-04-17 DIAGNOSIS — I10 Essential (primary) hypertension: Secondary | ICD-10-CM | POA: Diagnosis not present

## 2019-04-17 DIAGNOSIS — Z841 Family history of disorders of kidney and ureter: Secondary | ICD-10-CM | POA: Insufficient documentation

## 2019-04-17 HISTORY — DX: Unilateral primary osteoarthritis, right knee: M17.11

## 2019-04-17 HISTORY — PX: PARTIAL KNEE ARTHROPLASTY: SHX2174

## 2019-04-17 HISTORY — DX: Presence of unspecified artificial knee joint: Z96.659

## 2019-04-17 LAB — GLUCOSE, CAPILLARY
Glucose-Capillary: 85 mg/dL (ref 70–99)
Glucose-Capillary: 96 mg/dL (ref 70–99)
Glucose-Capillary: 204 mg/dL — ABNORMAL HIGH (ref 70–99)
Glucose-Capillary: 121 mg/dL — ABNORMAL HIGH (ref 70–99)

## 2019-04-17 SURGERY — ARTHROPLASTY, KNEE, UNICOMPARTMENTAL
Anesthesia: Monitor Anesthesia Care | Site: Knee | Laterality: Right

## 2019-04-17 MED ORDER — HYDROCODONE-ACETAMINOPHEN 7.5-325 MG PO TABS
1.0000 | ORAL_TABLET | ORAL | Status: DC | PRN
  Administered 2019-04-17 – 2019-04-18 (×3): 1 via ORAL
  Filled 2019-04-17 (×2): qty 1
  Filled 2019-04-17: qty 2

## 2019-04-17 MED ORDER — LIDOCAINE 2% (20 MG/ML) 5 ML SYRINGE
INTRAMUSCULAR | Status: AC
  Filled 2019-04-17: qty 5

## 2019-04-17 MED ORDER — DOCUSATE SODIUM 100 MG PO CAPS
100.0000 mg | ORAL_CAPSULE | Freq: Two times a day (BID) | ORAL | Status: DC
  Administered 2019-04-17 – 2019-04-18 (×2): 100 mg via ORAL
  Filled 2019-04-17 (×2): qty 1

## 2019-04-17 MED ORDER — METHOCARBAMOL 500 MG IVPB - SIMPLE MED
500.0000 mg | Freq: Four times a day (QID) | INTRAVENOUS | Status: DC | PRN
  Administered 2019-04-17: 500 mg via INTRAVENOUS
  Filled 2019-04-17: qty 50

## 2019-04-17 MED ORDER — HYDROCODONE-ACETAMINOPHEN 5-325 MG PO TABS
1.0000 | ORAL_TABLET | ORAL | Status: DC | PRN
  Administered 2019-04-17: 2 via ORAL
  Administered 2019-04-18: 1 via ORAL
  Filled 2019-04-17: qty 1
  Filled 2019-04-17: qty 2

## 2019-04-17 MED ORDER — ACETAMINOPHEN 325 MG PO TABS: 325.0000 mg | ORAL_TABLET | Freq: Four times a day (QID) | ORAL | Status: DC | PRN

## 2019-04-17 MED ORDER — KETOROLAC TROMETHAMINE 30 MG/ML IJ SOLN
INTRAMUSCULAR | Status: DC | PRN
  Administered 2019-04-17: 30 mg via INTRAVENOUS

## 2019-04-17 MED ORDER — BISACODYL 10 MG RE SUPP: 10.0000 mg | Freq: Every day | RECTAL | Status: DC | PRN

## 2019-04-17 MED ORDER — LACTATED RINGERS IV SOLN
INTRAVENOUS | Status: DC
  Administered 2019-04-17 (×2): via INTRAVENOUS

## 2019-04-17 MED ORDER — ONDANSETRON HCL 4 MG PO TABS: 4.0000 mg | ORAL_TABLET | Freq: Three times a day (TID) | ORAL | 0 refills | Status: DC | PRN

## 2019-04-17 MED ORDER — CHLORHEXIDINE GLUCONATE 4 % EX LIQD: 60.0000 mL | Freq: Once | CUTANEOUS | Status: DC

## 2019-04-17 MED ORDER — ONDANSETRON HCL 4 MG/2ML IJ SOLN
INTRAMUSCULAR | Status: DC | PRN
  Administered 2019-04-17: 4 mg via INTRAVENOUS

## 2019-04-17 MED ORDER — ASPIRIN EC 325 MG PO TBEC: 325.0000 mg | DELAYED_RELEASE_TABLET | Freq: Two times a day (BID) | ORAL | 0 refills | Status: DC

## 2019-04-17 MED ORDER — BRIMONIDINE TARTRATE-TIMOLOL 0.2-0.5 % OP SOLN: 1.0000 [drp] | Freq: Two times a day (BID) | OPHTHALMIC | Status: DC

## 2019-04-17 MED ORDER — DEXAMETHASONE SODIUM PHOSPHATE 10 MG/ML IJ SOLN
INTRAMUSCULAR | Status: AC
  Filled 2019-04-17: qty 1

## 2019-04-17 MED ORDER — INSULIN ASPART 100 UNIT/ML ~~LOC~~ SOLN
0.0000 [IU] | Freq: Three times a day (TID) | SUBCUTANEOUS | Status: DC
  Administered 2019-04-17: 5 [IU] via SUBCUTANEOUS

## 2019-04-17 MED ORDER — METFORMIN HCL ER 500 MG PO TB24
500.0000 mg | ORAL_TABLET | Freq: Every day | ORAL | Status: DC
  Administered 2019-04-17: 500 mg via ORAL
  Filled 2019-04-17: qty 1

## 2019-04-17 MED ORDER — POLYETHYLENE GLYCOL 3350 17 G PO PACK: 17.0000 g | PACK | Freq: Every day | ORAL | Status: DC | PRN

## 2019-04-17 MED ORDER — SODIUM CHLORIDE 0.9 % IV SOLN
INTRAVENOUS | Status: DC | PRN
  Administered 2019-04-17: 50 ug/min via INTRAVENOUS

## 2019-04-17 MED ORDER — ONDANSETRON HCL 4 MG/2ML IJ SOLN: 4.0000 mg | Freq: Four times a day (QID) | INTRAMUSCULAR | Status: DC | PRN

## 2019-04-17 MED ORDER — ACETAMINOPHEN 500 MG PO TABS
500.0000 mg | ORAL_TABLET | Freq: Four times a day (QID) | ORAL | Status: AC
  Administered 2019-04-17 – 2019-04-18 (×4): 500 mg via ORAL
  Filled 2019-04-17 (×4): qty 1

## 2019-04-17 MED ORDER — MIDAZOLAM HCL 2 MG/2ML IJ SOLN
1.0000 mg | INTRAMUSCULAR | Status: DC
  Administered 2019-04-17: 1 mg via INTRAVENOUS
  Filled 2019-04-17: qty 2

## 2019-04-17 MED ORDER — SENNA-DOCUSATE SODIUM 8.6-50 MG PO TABS: 2.0000 | ORAL_TABLET | Freq: Every day | ORAL | 1 refills | Status: DC

## 2019-04-17 MED ORDER — OXYCODONE HCL 5 MG PO TABS: 5.0000 mg | ORAL_TABLET | Freq: Once | ORAL | Status: DC | PRN

## 2019-04-17 MED ORDER — ROPIVACAINE HCL 7.5 MG/ML IJ SOLN
INTRAMUSCULAR | Status: DC | PRN
  Administered 2019-04-17: 25 mL via PERINEURAL

## 2019-04-17 MED ORDER — SAXAGLIPTIN-METFORMIN ER 5-500 MG PO TB24: 1.0000 | ORAL_TABLET | Freq: Every day | ORAL | Status: DC

## 2019-04-17 MED ORDER — ONDANSETRON HCL 4 MG/2ML IJ SOLN: 4.0000 mg | Freq: Once | INTRAMUSCULAR | Status: DC | PRN

## 2019-04-17 MED ORDER — POTASSIUM CHLORIDE IN NACL 20-0.45 MEQ/L-% IV SOLN
INTRAVENOUS | Status: DC
  Administered 2019-04-17: 15:00:00 via INTRAVENOUS
  Filled 2019-04-17 (×2): qty 1000

## 2019-04-17 MED ORDER — OXYCODONE HCL 5 MG/5ML PO SOLN: 5.0000 mg | Freq: Once | ORAL | Status: DC | PRN

## 2019-04-17 MED ORDER — ACETAMINOPHEN 325 MG PO TABS: 325.0000 mg | ORAL_TABLET | ORAL | Status: DC | PRN

## 2019-04-17 MED ORDER — LINAGLIPTIN 5 MG PO TABS
5.0000 mg | ORAL_TABLET | Freq: Every day | ORAL | Status: DC
  Administered 2019-04-17: 5 mg via ORAL
  Filled 2019-04-17: qty 1

## 2019-04-17 MED ORDER — BRIMONIDINE TARTRATE 0.2 % OP SOLN
1.0000 [drp] | Freq: Two times a day (BID) | OPHTHALMIC | Status: DC
  Administered 2019-04-17 – 2019-04-18 (×2): 1 [drp] via OPHTHALMIC
  Filled 2019-04-17: qty 5

## 2019-04-17 MED ORDER — METHOCARBAMOL 500 MG IVPB - SIMPLE MED
INTRAVENOUS | Status: AC
  Filled 2019-04-17: qty 50

## 2019-04-17 MED ORDER — HYDROCHLOROTHIAZIDE 12.5 MG PO CAPS
12.5000 mg | ORAL_CAPSULE | Freq: Every day | ORAL | Status: DC
  Administered 2019-04-17 – 2019-04-18 (×2): 12.5 mg via ORAL
  Filled 2019-04-17 (×2): qty 1

## 2019-04-17 MED ORDER — FENTANYL CITRATE (PF) 100 MCG/2ML IJ SOLN
50.0000 ug | INTRAMUSCULAR | Status: DC
  Administered 2019-04-17: 50 ug via INTRAVENOUS
  Filled 2019-04-17: qty 2

## 2019-04-17 MED ORDER — CEFAZOLIN SODIUM-DEXTROSE 2-4 GM/100ML-% IV SOLN
2.0000 g | INTRAVENOUS | Status: AC
  Administered 2019-04-17: 2 g via INTRAVENOUS
  Filled 2019-04-17: qty 100

## 2019-04-17 MED ORDER — ACETAMINOPHEN 160 MG/5ML PO SOLN: 325.0000 mg | ORAL | Status: DC | PRN

## 2019-04-17 MED ORDER — ONDANSETRON HCL 4 MG PO TABS: 4.0000 mg | ORAL_TABLET | Freq: Four times a day (QID) | ORAL | Status: DC | PRN

## 2019-04-17 MED ORDER — ZOLPIDEM TARTRATE 5 MG PO TABS: 5.0000 mg | ORAL_TABLET | Freq: Every evening | ORAL | Status: DC | PRN

## 2019-04-17 MED ORDER — PRAVASTATIN SODIUM 20 MG PO TABS
40.0000 mg | ORAL_TABLET | Freq: Every day | ORAL | Status: DC
  Administered 2019-04-17 – 2019-04-18 (×2): 40 mg via ORAL
  Filled 2019-04-17 (×2): qty 2

## 2019-04-17 MED ORDER — POVIDONE-IODINE 10 % EX SWAB
2.0000 "application " | Freq: Once | CUTANEOUS | Status: AC
  Administered 2019-04-17: 2 via TOPICAL

## 2019-04-17 MED ORDER — PROPOFOL 500 MG/50ML IV EMUL
INTRAVENOUS | Status: AC
  Filled 2019-04-17: qty 50

## 2019-04-17 MED ORDER — DIPHENHYDRAMINE HCL 12.5 MG/5ML PO ELIX: 12.5000 mg | ORAL_SOLUTION | ORAL | Status: DC | PRN

## 2019-04-17 MED ORDER — LATANOPROST 0.005 % OP SOLN
1.0000 [drp] | Freq: Every day | OPHTHALMIC | Status: DC
  Administered 2019-04-17: 1 [drp] via OPHTHALMIC
  Filled 2019-04-17: qty 2.5

## 2019-04-17 MED ORDER — LOSARTAN POTASSIUM 50 MG PO TABS
50.0000 mg | ORAL_TABLET | Freq: Every day | ORAL | Status: DC
  Administered 2019-04-17 – 2019-04-18 (×2): 50 mg via ORAL
  Filled 2019-04-17 (×2): qty 1

## 2019-04-17 MED ORDER — ONDANSETRON HCL 4 MG/2ML IJ SOLN
INTRAMUSCULAR | Status: AC
  Filled 2019-04-17: qty 2

## 2019-04-17 MED ORDER — DEXAMETHASONE SODIUM PHOSPHATE 10 MG/ML IJ SOLN
INTRAMUSCULAR | Status: DC | PRN
  Administered 2019-04-17: 4 mg via INTRAVENOUS

## 2019-04-17 MED ORDER — MEPERIDINE HCL 50 MG/ML IJ SOLN: 6.2500 mg | INTRAMUSCULAR | Status: DC | PRN

## 2019-04-17 MED ORDER — PHENOL 1.4 % MT LIQD: 1.0000 | OROMUCOSAL | Status: DC | PRN

## 2019-04-17 MED ORDER — METOCLOPRAMIDE HCL 5 MG/ML IJ SOLN: 5.0000 mg | Freq: Three times a day (TID) | INTRAMUSCULAR | Status: DC | PRN

## 2019-04-17 MED ORDER — BUPIVACAINE IN DEXTROSE 0.75-8.25 % IT SOLN
INTRATHECAL | Status: DC | PRN
  Administered 2019-04-17: 1.8 mL via INTRATHECAL

## 2019-04-17 MED ORDER — TRANEXAMIC ACID-NACL 1000-0.7 MG/100ML-% IV SOLN
1000.0000 mg | Freq: Once | INTRAVENOUS | Status: AC
  Administered 2019-04-17: 1000 mg via INTRAVENOUS
  Filled 2019-04-17: qty 100

## 2019-04-17 MED ORDER — BUPIVACAINE HCL 0.25 % IJ SOLN
INTRAMUSCULAR | Status: DC | PRN
  Administered 2019-04-17: 30 mL

## 2019-04-17 MED ORDER — DEXAMETHASONE SODIUM PHOSPHATE 10 MG/ML IJ SOLN
10.0000 mg | Freq: Once | INTRAMUSCULAR | Status: AC
  Administered 2019-04-18: 10 mg via INTRAVENOUS
  Filled 2019-04-17: qty 1

## 2019-04-17 MED ORDER — ALUM & MAG HYDROXIDE-SIMETH 200-200-20 MG/5ML PO SUSP: 30.0000 mL | ORAL | Status: DC | PRN

## 2019-04-17 MED ORDER — DULOXETINE HCL 60 MG PO CPEP
60.0000 mg | ORAL_CAPSULE | Freq: Every day | ORAL | Status: DC
  Administered 2019-04-18: 60 mg via ORAL
  Filled 2019-04-17: qty 1

## 2019-04-17 MED ORDER — PROPOFOL 500 MG/50ML IV EMUL
INTRAVENOUS | Status: DC | PRN
  Administered 2019-04-17: 75 ug/kg/min via INTRAVENOUS

## 2019-04-17 MED ORDER — METOCLOPRAMIDE HCL 5 MG PO TABS: 5.0000 mg | ORAL_TABLET | Freq: Three times a day (TID) | ORAL | Status: DC | PRN

## 2019-04-17 MED ORDER — MORPHINE SULFATE (PF) 2 MG/ML IV SOLN: 0.5000 mg | INTRAVENOUS | Status: DC | PRN

## 2019-04-17 MED ORDER — DEXAMETHASONE SODIUM PHOSPHATE 4 MG/ML IJ SOLN
INTRAMUSCULAR | Status: DC | PRN
  Administered 2019-04-17: 10 mg via INTRAVENOUS

## 2019-04-17 MED ORDER — GABAPENTIN 300 MG PO CAPS
900.0000 mg | ORAL_CAPSULE | Freq: Three times a day (TID) | ORAL | Status: DC
  Administered 2019-04-17 – 2019-04-18 (×3): 900 mg via ORAL
  Filled 2019-04-17 (×4): qty 3

## 2019-04-17 MED ORDER — TIMOLOL MALEATE 0.5 % OP SOLN
1.0000 [drp] | Freq: Two times a day (BID) | OPHTHALMIC | Status: DC
  Administered 2019-04-17 – 2019-04-18 (×2): 1 [drp] via OPHTHALMIC
  Filled 2019-04-17: qty 5

## 2019-04-17 MED ORDER — TRANEXAMIC ACID-NACL 1000-0.7 MG/100ML-% IV SOLN
1000.0000 mg | INTRAVENOUS | Status: AC
  Administered 2019-04-17: 1000 mg via INTRAVENOUS
  Filled 2019-04-17: qty 100

## 2019-04-17 MED ORDER — FENTANYL CITRATE (PF) 100 MCG/2ML IJ SOLN: 25.0000 ug | INTRAMUSCULAR | Status: DC | PRN

## 2019-04-17 MED ORDER — PROPOFOL 10 MG/ML IV BOLUS
INTRAVENOUS | Status: DC | PRN
  Administered 2019-04-17: 10 mg via INTRAVENOUS

## 2019-04-17 MED ORDER — ANASTROZOLE 1 MG PO TABS: 1.0000 mg | ORAL_TABLET | Freq: Every day | ORAL | Status: DC

## 2019-04-17 MED ORDER — MENTHOL 3 MG MT LOZG: 1.0000 | LOZENGE | OROMUCOSAL | Status: DC | PRN

## 2019-04-17 MED ORDER — MAGNESIUM CITRATE PO SOLN: 1.0000 | Freq: Once | ORAL | Status: DC | PRN

## 2019-04-17 MED ORDER — LIDOCAINE HCL (CARDIAC) PF 100 MG/5ML IV SOSY
PREFILLED_SYRINGE | INTRAVENOUS | Status: DC | PRN
  Administered 2019-04-17: 60 mg via INTRAVENOUS

## 2019-04-17 MED ORDER — VITAMIN D 25 MCG (1000 UNIT) PO TABS
5000.0000 [IU] | ORAL_TABLET | Freq: Every day | ORAL | Status: DC
  Administered 2019-04-17 – 2019-04-18 (×2): 5000 [IU] via ORAL
  Filled 2019-04-17 (×2): qty 5

## 2019-04-17 MED ORDER — TOPIRAMATE 100 MG PO TABS
100.0000 mg | ORAL_TABLET | Freq: Two times a day (BID) | ORAL | Status: DC
  Administered 2019-04-17 – 2019-04-18 (×2): 100 mg via ORAL
  Filled 2019-04-17 (×2): qty 1

## 2019-04-17 MED ORDER — PROPOFOL 10 MG/ML IV BOLUS
INTRAVENOUS | Status: AC
  Filled 2019-04-17: qty 20

## 2019-04-17 MED ORDER — KETOROLAC TROMETHAMINE 15 MG/ML IJ SOLN
7.5000 mg | Freq: Four times a day (QID) | INTRAMUSCULAR | Status: AC
  Administered 2019-04-17 – 2019-04-18 (×3): 7.5 mg via INTRAVENOUS
  Filled 2019-04-17 (×3): qty 1

## 2019-04-17 MED ORDER — HYDROCODONE-ACETAMINOPHEN 10-325 MG PO TABS: 1.0000 | ORAL_TABLET | Freq: Four times a day (QID) | ORAL | 0 refills | Status: DC | PRN

## 2019-04-17 MED ORDER — LEVOTHYROXINE SODIUM 75 MCG PO TABS
75.0000 ug | ORAL_TABLET | ORAL | Status: DC
  Administered 2019-04-18: 75 ug via ORAL
  Filled 2019-04-17: qty 1

## 2019-04-17 MED ORDER — PHENYLEPHRINE HCL (PRESSORS) 10 MG/ML IV SOLN
INTRAVENOUS | Status: AC
  Filled 2019-04-17: qty 1

## 2019-04-17 MED ORDER — CEFAZOLIN SODIUM-DEXTROSE 2-4 GM/100ML-% IV SOLN
2.0000 g | Freq: Four times a day (QID) | INTRAVENOUS | Status: AC
  Administered 2019-04-17 (×2): 2 g via INTRAVENOUS
  Filled 2019-04-17 (×2): qty 100

## 2019-04-17 MED ORDER — ASPIRIN EC 325 MG PO TBEC
325.0000 mg | DELAYED_RELEASE_TABLET | Freq: Two times a day (BID) | ORAL | Status: DC
  Administered 2019-04-17 – 2019-04-18 (×2): 325 mg via ORAL
  Filled 2019-04-17 (×2): qty 1

## 2019-04-17 MED ORDER — LOSARTAN POTASSIUM-HCTZ 50-12.5 MG PO TABS: 1.0000 | ORAL_TABLET | Freq: Every day | ORAL | Status: DC

## 2019-04-17 MED ORDER — METHOCARBAMOL 500 MG PO TABS
500.0000 mg | ORAL_TABLET | Freq: Four times a day (QID) | ORAL | Status: DC | PRN
  Administered 2019-04-18: 500 mg via ORAL
  Filled 2019-04-17: qty 1

## 2019-04-17 MED ORDER — ACETAMINOPHEN 500 MG PO TABS
1000.0000 mg | ORAL_TABLET | Freq: Once | ORAL | Status: AC
  Administered 2019-04-17: 1000 mg via ORAL
  Filled 2019-04-17: qty 2

## 2019-04-17 SURGICAL SUPPLY — 65 items
BAG SPEC THK2 15X12 ZIP CLS (MISCELLANEOUS) ×1
BAG ZIPLOCK 12X15 (MISCELLANEOUS) ×2 IMPLANT
BANDAGE ESMARK 6X9 LF (GAUZE/BANDAGES/DRESSINGS) ×1 IMPLANT
BLADE SURG 15 STRL LF DISP TIS (BLADE) ×1 IMPLANT
BLADE SURG 15 STRL SS (BLADE) ×2
BNDG CMPR 9X6 STRL LF SNTH (GAUZE/BANDAGES/DRESSINGS) ×1
BNDG CMPR MED 15X6 ELC VLCR LF (GAUZE/BANDAGES/DRESSINGS) ×1
BNDG ELASTIC 6X15 VLCR STRL LF (GAUZE/BANDAGES/DRESSINGS) ×2 IMPLANT
BNDG ESMARK 6X9 LF (GAUZE/BANDAGES/DRESSINGS) ×2
BOWL SMART MIX CTS (DISPOSABLE) ×2 IMPLANT
BRNG TIB SM 3 PHS 3 RT MEN (Joint) ×1 IMPLANT
CEMENT BONE R 1X40 (Cement) ×2 IMPLANT
CLSR STERI-STRIP ANTIMIC 1/2X4 (GAUZE/BANDAGES/DRESSINGS) ×2 IMPLANT
COVER SURGICAL LIGHT HANDLE (MISCELLANEOUS) ×2 IMPLANT
CUFF TOURN SGL QUICK 34 (TOURNIQUET CUFF) ×2
CUFF TRNQT CYL 34X4.125X (TOURNIQUET CUFF) ×1 IMPLANT
DECANTER SPIKE VIAL GLASS SM (MISCELLANEOUS) ×1 IMPLANT
DRAPE EXTREMITY T 121X128X90 (DISPOSABLE) ×2 IMPLANT
DRAPE POUCH INSTRU U-SHP 10X18 (DRAPES) ×2 IMPLANT
DRAPE SHEET LG 3/4 BI-LAMINATE (DRAPES) ×2 IMPLANT
DRAPE U-SHAPE 47X51 STRL (DRAPES) ×2 IMPLANT
DRSG MEPILEX BORDER 4X8 (GAUZE/BANDAGES/DRESSINGS) ×2 IMPLANT
DRSG PAD ABDOMINAL 8X10 ST (GAUZE/BANDAGES/DRESSINGS) ×2 IMPLANT
DURAPREP 26ML APPLICATOR (WOUND CARE) ×4 IMPLANT
ELECT REM PT RETURN 15FT ADLT (MISCELLANEOUS) ×2 IMPLANT
FACESHIELD WRAPAROUND (MASK) ×2 IMPLANT
FACESHIELD WRAPAROUND OR TEAM (MASK) ×1 IMPLANT
GLOVE BIO SURGEON STRL SZ7.5 (GLOVE) ×2 IMPLANT
GLOVE BIOGEL PI IND STRL 8 (GLOVE) ×2 IMPLANT
GLOVE BIOGEL PI INDICATOR 8 (GLOVE) ×2
GLOVE SURG ORTHO 8.0 STRL STRW (GLOVE) ×2 IMPLANT
GOWN STRL REUS W/TWL 2XL LVL3 (GOWN DISPOSABLE) ×2 IMPLANT
GOWN STRL REUS W/TWL LRG LVL3 (GOWN DISPOSABLE) ×2 IMPLANT
HANDPIECE INTERPULSE COAX TIP (DISPOSABLE) ×2
HOLDER FOLEY CATH W/STRAP (MISCELLANEOUS) IMPLANT
HOOD PEEL AWAY FLYTE STAYCOOL (MISCELLANEOUS) ×4 IMPLANT
IMMOBILIZER KNEE 20 (SOFTGOODS) ×2
IMMOBILIZER KNEE 20 THIGH 36 (SOFTGOODS) IMPLANT
IMMOBILIZER KNEE 22 UNIV (SOFTGOODS) ×2 IMPLANT
KIT BASIN OR (CUSTOM PROCEDURE TRAY) ×2 IMPLANT
KIT TURNOVER KIT A (KITS) IMPLANT
KNEE OXFORD UNI MENISCAL (Joint) ×1 IMPLANT
NDL SAFETY ECLIPSE 18X1.5 (NEEDLE) ×1 IMPLANT
NEEDLE HYPO 18GX1.5 SHARP (NEEDLE) ×2
NS IRRIG 1000ML POUR BTL (IV SOLUTION) ×2 IMPLANT
PACK BLADE SAW RECIP 70 3 PT (BLADE) ×1 IMPLANT
PACK ICE MAXI GEL EZY WRAP (MISCELLANEOUS) ×2 IMPLANT
PACK TOTAL JOINT (CUSTOM PROCEDURE TRAY) ×2 IMPLANT
PEG FEMORAL PEGGED STRL SM (Knees) ×1 IMPLANT
PROTECTOR NERVE ULNAR (MISCELLANEOUS) ×2 IMPLANT
SET HNDPC FAN SPRY TIP SCT (DISPOSABLE) ×1 IMPLANT
SUCTION FRAZIER HANDLE 12FR (TUBING) ×1
SUCTION TUBE FRAZIER 12FR DISP (TUBING) ×1 IMPLANT
SUT VIC AB 0 CT1 36 (SUTURE) ×2 IMPLANT
SUT VIC AB 2-0 CT1 27 (SUTURE) ×2
SUT VIC AB 2-0 CT1 TAPERPNT 27 (SUTURE) ×1 IMPLANT
SUT VIC AB 3-0 SH 8-18 (SUTURE) ×2 IMPLANT
SYR 20ML LL LF (SYRINGE) ×2 IMPLANT
SYR 30ML LL (SYRINGE) ×2 IMPLANT
SYR 3ML LL SCALE MARK (SYRINGE) ×2 IMPLANT
TOWEL OR 17X26 10 PK STRL BLUE (TOWEL DISPOSABLE) ×2 IMPLANT
TOWEL OR NON WOVEN STRL DISP B (DISPOSABLE) ×2 IMPLANT
TRAY FOLEY MTR SLVR 16FR STAT (SET/KITS/TRAYS/PACK) ×2 IMPLANT
TRAY TIBIAL KNEE OXFORD SZAA (Joint) ×1 IMPLANT
WATER STERILE IRR 1000ML POUR (IV SOLUTION) ×2 IMPLANT

## 2019-04-17 NOTE — Anesthesia Procedure Notes (Signed)
Spinal  Patient location during procedure: OR End time: 04/17/2019 10:27 AM Staffing Resident/CRNA: Caryl Pina T, CRNA Performed: resident/CRNA  Preanesthetic Checklist Completed: patient identified, site marked, surgical consent, pre-op evaluation, timeout performed, IV checked, risks and benefits discussed and monitors and equipment checked Spinal Block Patient position: sitting Prep: DuraPrep Patient monitoring: heart rate, cardiac monitor, continuous pulse ox and blood pressure Approach: midline Location: L3-4 Injection technique: single-shot Needle Needle type: Pencan  Needle gauge: 24 G Needle length: 9 cm Assessment Sensory level: T4 Additional Notes Expiration date of kit checked and confirmed. Patient tolerated procedure well, without complications.

## 2019-04-17 NOTE — Anesthesia Preprocedure Evaluation (Addendum)
Anesthesia Evaluation  Patient identified by MRN, date of birth, ID band Patient awake    Reviewed: Allergy & Precautions, H&P , NPO status , Patient's Chart, lab work & pertinent test results, reviewed documented beta blocker date and time   Airway Mallampati: II  TM Distance: >3 FB Neck ROM: full    Dental no notable dental hx.    Pulmonary neg pulmonary ROS,    Pulmonary exam normal breath sounds clear to auscultation       Cardiovascular Exercise Tolerance: Good hypertension, Pt. on medications negative cardio ROS   Rhythm:regular Rate:Normal     Neuro/Psych negative neurological ROS  negative psych ROS   GI/Hepatic Neg liver ROS, GERD  ,  Endo/Other  diabetes, Type 2Hypothyroidism   Renal/GU negative Renal ROS  negative genitourinary   Musculoskeletal   Abdominal   Peds  Hematology negative hematology ROS (+)   Anesthesia Other Findings   Reproductive/Obstetrics negative OB ROS                             Anesthesia Physical Anesthesia Plan  ASA: III  Anesthesia Plan: Spinal and MAC   Post-op Pain Management:  Regional for Post-op pain   Induction:   PONV Risk Score and Plan: 3 and Ondansetron, Treatment may vary due to age or medical condition, Midazolam and Propofol infusion  Airway Management Planned: Nasal Cannula and Mask  Additional Equipment:   Intra-op Plan:   Post-operative Plan:   Informed Consent: I have reviewed the patients History and Physical, chart, labs and discussed the procedure including the risks, benefits and alternatives for the proposed anesthesia with the patient or authorized representative who has indicated his/her understanding and acceptance.     Dental Advisory Given  Plan Discussed with: CRNA, Anesthesiologist and Surgeon  Anesthesia Plan Comments:        Anesthesia Quick Evaluation

## 2019-04-17 NOTE — Progress Notes (Addendum)
AssistedDr. Ambrose Pancoast with right, ultrasound guided, popliteal, adductor canal block. Side rails up, monitors on throughout procedure. See vital signs in flow sheet. Tolerated Procedure well.

## 2019-04-17 NOTE — Transfer of Care (Signed)
Immediate Anesthesia Transfer of Care Note  Patient: Christina Hernandez  Procedure(s) Performed: UNICOMPARTMENTAL KNEE (Right Knee)  Patient Location: PACU  Anesthesia Type:MAC and Spinal  Level of Consciousness: awake, alert , oriented and patient cooperative  Airway & Oxygen Therapy: Patient Spontanous Breathing and Patient connected to face mask oxygen  Post-op Assessment: Report given to RN, Post -op Vital signs reviewed and stable and Patient moving all extremities  Post vital signs: Reviewed and stable  Last Vitals:  Vitals Value Taken Time  BP 115/64 04/17/19 1230  Temp    Pulse 79 04/17/19 1231  Resp 16 04/17/19 1231  SpO2 100 % 04/17/19 1231  Vitals shown include unvalidated device data.  Last Pain:  Vitals:   04/17/19 1010  TempSrc:   PainSc: 0-No pain         Complications: No apparent anesthesia complications

## 2019-04-17 NOTE — Progress Notes (Signed)
Pt. Slightly drowsy upon admisson. States she did not sleep well last night.  Able to answer all questions and opens eyes to voice

## 2019-04-17 NOTE — Plan of Care (Signed)
Plan of care for post op day 0 discussed with patient. All questions answered.   Will continue to monitor patient.

## 2019-04-17 NOTE — Anesthesia Procedure Notes (Addendum)
Anesthesia Regional Block: Adductor canal block   Pre-Anesthetic Checklist: ,, timeout performed, Correct Patient, Correct Site, Correct Laterality, Correct Procedure, Correct Position, site marked, Risks and benefits discussed,  Surgical consent,  Pre-op evaluation,  At surgeon's request and post-op pain management  Laterality: Right  Prep: chloraprep       Needles:  Injection technique: Single-shot  Needle Type: Echogenic Stimulator Needle     Needle Length: 5cm  Needle Gauge: 22     Additional Needles:   Procedures:, nerve stimulator,,, ultrasound used (permanent image in chart),,,,  Narrative:  Start time: 04/17/2019 9:45 AM End time: 04/17/2019 9:51 AM Injection made incrementally with aspirations every 5 mL.  Performed by: Personally  Anesthesiologist: Janeece Riggers, MD  Additional Notes: Functioning IV was confirmed and monitors were applied.  A 64mm 22ga Arrow echogenic stimulator needle was used. Sterile prep and drape,hand hygiene and sterile gloves were used. Ultrasound guidance: relevant anatomy identified, needle position confirmed, local anesthetic spread visualized around nerve(s)., vascular puncture avoided.  Image printed for medical record. Negative aspiration and negative test dose prior to incremental administration of local anesthetic. The patient tolerated the procedure well.

## 2019-04-17 NOTE — Evaluation (Signed)
Physical Therapy Evaluation Patient Details Name: Christina Hernandez MRN: [id number] DOB: [birth year] Today's Date: 04/17/2019   History of Present Illness  Patient is 68 y.o. female s/p Rt TKA on 04/17/19 with PMH significant for hypothyroidism, HTN, HLD, DM, GERD, and Rt breast lumpectomy.    Clinical Impression  Rashelle Ragina Gordillo is a 68 y.o. female POD 0 s/p Rt TKA. Patient reports independence with mobility at baseline. Patient is now limited by functional impairments (see PT problem list below) and requires min assist for transfers and gait with RW. Patient was able to ambulate ~80 feet with RW and min assist/cues for safe use. Patient instructed in exercise to facilitate ROM and circulation to manage edema and prevent secondary medical complications. Patient will benefit from continued skilled PT interventions to address impairments and progress towards PLOF. Acute PT will follow to progress mobility and stair training in preparation for safe discharge home.     Follow Up Recommendations Follow surgeon's recommendation for DC plan and follow-up therapies    Equipment Recommendations  None recommended by PT    Recommendations for Other Services       Precautions / Restrictions Precautions Precautions: Fall Restrictions Weight Bearing Restrictions: No      Mobility  Bed Mobility Overal bed mobility: Needs Assistance Bed Mobility: Supine to Sit     Supine to sit: HOB elevated;Min assist     General bed mobility comments: pt utilizing bed rails, cues for sequencing and light min assist to sit upright at EOB  Transfers Overall transfer level: Needs assistance Equipment used: Rolling walker (2 wheeled) Transfers: Sit to/from Stand Sit to Stand: Min assist         General transfer comment: cues for safe hand placement on RW and technique, assist to initiate power up and complete rise to stand  Ambulation/Gait Ambulation/Gait assistance: Min assist Gait  Distance (Feet): 80 Feet Assistive device: Rolling walker (2 wheeled) Gait Pattern/deviations: Step-through pattern;Decreased step length - right;Decreased stance time - right;Decreased stride length Gait velocity: decreased   General Gait Details: cues at start for safe hand placement and safe proximity to RW, pt maintained throughout, no buckling with WB and no overt LOB noted, cues requried for posture and assist to negotiate doorways, obstacles  Stairs            Wheelchair Mobility    Modified Rankin (Stroke Patients Only)       Balance Overall balance assessment: Needs assistance Sitting-balance support: Feet supported;No upper extremity supported Sitting balance-Leahy Scale: Good     Standing balance support: During functional activity;Bilateral upper extremity supported Standing balance-Leahy Scale: Poor              Pertinent Vitals/Pain Pain Assessment: 0-10 Pain Score: 1  Pain Location: Rt Knee Pain Descriptors / Indicators: Aching;Sore Pain Intervention(s): Limited activity within patient's tolerance;Monitored during session;Repositioned    Home Living Family/patient expects to be discharged to:: Private residence Living Arrangements: Spouse/significant other;Children(son and foster child) Available Help at Discharge: Family;Available 24 hours/day(husband available 24/7 and daughter home for 1 week) Type of Home: House Home Access: Stairs to enter Entrance Stairs-Rails: Psychiatric nurse of Steps: 7 Home Layout: Two level;Bed/bath upstairs Home Equipment: Walker - 2 wheels;Cane - quad(educated on how to obtain a shower seat)      Prior Function Level of Independence: Independent               Hand Dominance   Dominant Hand: Right    Extremity/Trunk Assessment  Upper Extremity Assessment Upper Extremity Assessment: Overall WFL for tasks assessed    Lower Extremity Assessment Lower Extremity Assessment: RLE  deficits/detail RLE Deficits / Details: pt with good quad activation in supine and no extensor lag with SLR, 4/5 for LAQ resisted testing RLE Sensation: WNL RLE Coordination: WNL    Cervical / Trunk Assessment Cervical / Trunk Assessment: Normal  Communication   Communication: No difficulties  Cognition Arousal/Alertness: Awake/alert Behavior During Therapy: WFL for tasks assessed/performed Overall Cognitive Status: Within Functional Limits for tasks assessed                       General Comments      Exercises Total Joint Exercises Ankle Circles/Pumps: AROM;10 reps;Seated;Both Quad Sets: AROM;10 reps;Supine;Right Heel Slides: 10 reps;Seated;Right;AAROM(with gait belt)   Assessment/Plan    PT Assessment Patient needs continued PT services  PT Problem List Decreased strength;Decreased balance;Decreased mobility;Decreased range of motion;Decreased activity tolerance;Decreased knowledge of use of DME       PT Treatment Interventions DME instruction;Functional mobility training;Balance training;Patient/family education;Modalities;Gait training;Therapeutic activities;Therapeutic exercise;Stair training    PT Goals (Current goals can be found in the Care Plan section)  Acute Rehab PT Goals Patient Stated Goal: to return home and get back to independence PT Goal Formulation: With patient Time For Goal Achievement: 04/24/19 Potential to Achieve Goals: Good    Frequency 7X/week   Barriers to discharge        Co-evaluation               AM-PAC PT "6 Clicks" Mobility  Outcome Measure Help needed turning from your back to your side while in a flat bed without using bedrails?: A Little Help needed moving from lying on your back to sitting on the side of a flat bed without using bedrails?: A Little Help needed moving to and from a bed to a chair (including a wheelchair)?: A Little Help needed standing up from a chair using your arms (e.g., wheelchair or bedside  chair)?: A Little Help needed to walk in hospital room?: A Little Help needed climbing 3-5 steps with a railing? : A Lot 6 Click Score: 17    End of Session Equipment Utilized During Treatment: Gait belt Activity Tolerance: Patient tolerated treatment well Patient left: with call bell/phone within reach;in chair;with family/visitor present;with chair alarm set Nurse Communication: Mobility status PT Visit Diagnosis: Muscle weakness (generalized) (M62.81);Difficulty in walking, not elsewhere classified (R26.2)    Time: [id number] PT Time Calculation (min) (ACUTE ONLY): 40 min   Charges:   PT Evaluation $PT Eval Low Complexity: 1 Low PT Treatments $Therapeutic Exercise: 8-22 mins        Kipp Brood, PT, DPT Physical Therapist with [facility]  04/17/2019 7:26 PM

## 2019-04-17 NOTE — H&P (Signed)
PREOPERATIVE H&P  Chief Complaint: Right knee pain  HPI: Nataline Jeanmarie Sollman is a 68 y.o. female who presents for preoperative history and physical with a diagnosis of right anteromedial knee osteoarthritis. Symptoms are rated as moderate to severe, and have been worsening.  This is significantly impairing activities of daily living.  She has elected for surgical management.   She has failed injections, activity modification, anti-inflammatories, and assistive devices.  Preoperative X-rays demonstrate end stage degenerative changes with osteophyte formation, loss of joint space, subchondral sclerosis. She is also had a previous knee arthroscopy, she has had braces, viscosupplementation, still with significant pain.  Diabetes is reasonably well controlled with an A1c of approximately 6.  Past Medical History:  Diagnosis Date  . Arthritis   . Breast cancer, right breast [facility])    pending right breast lumpectomy on September 01, 2018  . Diabetes mellitus type II, controlled ([place])    On Kombiglyze  . Family history of ovarian cancer   . Family history of thyroid cancer   . GERD (gastroesophageal reflux disease)    rarely occurs- no meds  . Hyperlipidemia associated with type 2 diabetes mellitus ([place])   . Hypertension   . Hypothyroidism   . Trigeminal neuralgia of left side of face 2015   s/p Gamma knife   Past Surgical History:  Procedure Laterality Date  . BREAST LUMPECTOMY WITH RADIOACTIVE SEED LOCALIZATION Right 09/01/2018   Procedure: RIGHT BREAST LUMPECTOMY WITH RADIOACTIVE SEED LOCALIZATION;  Surgeon: [name], MD;  Location: [place];  Service: General;  Laterality: Right;  . COLONOSCOPY    . Gamma Knife Left 04/08/2016  . HYSTEROSCOPY W/D&C  05/24/2011   Procedure: DILATATION AND CURETTAGE (D&C) /HYSTEROSCOPY;  Surgeon: [name], MD;  Location: [facility];  Service: Gynecology;  Laterality: N/A;  . KNEE SURGERY Right 06/10/15   torn ligament  .  THYROIDECTOMY N/A 09/08/2016   Procedure: TOTAL THYROIDECTOMY;  Surgeon: [name], MD;  Location: [facility];  Service: ENT;  Laterality: N/A;  . TUBAL LIGATION    . uterine polyp    . WISDOM TOOTH EXTRACTION Left 2014   Social History   Socioeconomic History  . Marital status: Married    Spouse name: [name]  . Number of children: 4  . Years of education: BS  . Highest education level: Not on file  Occupational History  . Occupation: Retired    Fish farm manager: OTHER  Social Needs  . Financial resource strain: Not hard at all  . Food insecurity    Worry: Never true    Inability: Never true  . Transportation needs    Medical: No    Non-medical: No  Tobacco Use  . Smoking status: Never Smoker  . Smokeless tobacco: Never Used  Substance and Sexual Activity  . Alcohol use: No  . Drug use: No  . Sexual activity: Not Currently  Lifestyle  . Physical activity    Days per week: 0 days    Minutes per session: 0 min  . Stress: Only a little  Relationships  . Social Herbalist on phone: Not on file    Gets together: Not on file    Attends religious service: Not on file    Active member of club or organization: Not on file    Attends meetings of clubs or organizations: Not on file    Relationship status: Not on file  Other Topics Concern  . Not on file  Social History Narrative  Patient lives at home with family.   Caffeine Use: occasionally      Lives at home with husband and son.  Is a retired Pharmacist, hospital   Family History  Problem Relation Age of Onset  . Hypertension Mother   . Kidney failure Mother   . Congestive Heart Failure Mother   . Heart Problems Father   . Stroke Father   . Pneumonia Father   . Heart Problems Sister 40       heart transplant  . Stroke Paternal Grandmother   . Dementia Paternal Grandmother   . Stroke Paternal Grandfather   . Cardiomyopathy Sister   . Ovarian cancer Sister 67       d. 84  . Thyroid disease Sister   . Thyroid disease Sister    . Thyroid cancer Niece 12  . Breast cancer Neg Hx    Allergies  Allergen Reactions  . Cyclobenzaprine Nausea Only   Prior to Admission medications   Medication Sig Start Date End Date Taking? Authorizing Provider  acetaminophen (TYLENOL) 500 MG tablet Take 1 tablet (500 mg total) by mouth every 8 (eight) hours as needed. Patient taking differently: Take 500 mg by mouth every 8 (eight) hours as needed for moderate pain or headache.  03/23/16  Yes Chitanand, Alicia B, DO  baclofen (LIORESAL) 10 MG tablet Take 10 mg by mouth 3 (three) times daily.    Yes [provider]  Biotin (BIOTIN MAXIMUM) 10000 MCG TBDP Take 10,000 mcg by mouth daily.    Yes [provider]  Cholecalciferol (VITAMIN D) 125 MCG (5000 UT) CAPS Take 5,000 Units by mouth daily.   Yes [provider]  COMBIGAN 0.2-0.5 % ophthalmic solution Place 1 drop into both eyes 2 (two) times daily. 03/15/19  Yes [provider]  DULoxetine (CYMBALTA) 60 MG capsule Take 1 capsule (60 mg total) by mouth daily. 07/04/18  Yes Penumalli, Earlean Polka, MD  gabapentin (NEURONTIN) 300 MG capsule Take 3 capsules (900 mg total) by mouth 3 (three) times daily. 07/04/18  Yes Penumalli, Vikram R, MD  KOMBIGLYZE XR 5-500 MG TB24 TAKE 1 TABLET BY MOUTH DAILY BEFORE SUPPER. Patient taking differently: Take 1 tablet by mouth daily before supper.  12/13/18  Yes Glendale Chard, MD  latanoprost (XALATAN) 0.005 % ophthalmic solution Place 1 drop into both eyes at bedtime. 04/01/19  Yes [provider]  losartan-hydrochlorothiazide (HYZAAR) 50-12.5 MG tablet TAKE 1 TABLET BY MOUTH EVERY DAY Patient taking differently: Take 1 tablet by mouth daily.  03/07/19  Yes Glendale Chard, MD  meloxicam (MOBIC) 15 MG tablet Take 15 mg by mouth daily.   Yes [provider]  pravastatin (PRAVACHOL) 40 MG tablet TAKE 1 TABLET BY MOUTH EVERY DAY Patient taking differently: Take 40 mg by mouth daily.  02/08/19  Yes Rodriguez-Southworth,  Sunday Spillers, PA-C  SYNTHROID 75 MCG tablet TAKE 1 TABLET DAILY MON-SAT, HOLD ON SUNDAY. Patient taking differently: Take 75 mcg by mouth See admin instructions. Take 75 mcg by mouth daily Monday - Saturday and hold on Sunday 03/22/19  Yes Glendale Chard, MD  topiramate (TOPAMAX) 50 MG tablet Take 100 mg by mouth 2 (two) times daily.    Yes [provider]  anastrozole (ARIMIDEX) 1 MG tablet Take 1 tablet (1 mg total) by mouth daily. Start Apr 29, 2019 04/10/19   Magrinat, [name], MD  cetirizine (ZYRTEC) 10 MG tablet Take 1 tablet (10 mg total) by mouth daily. Patient not taking: Reported on 04/06/2019 10/29/14  Tereasa Coop, PA-C  HYDROcodone-acetaminophen (NORCO/VICODIN) 5-325 MG tablet Take 1 tablet by mouth every 6 (six) hours as needed for moderate pain. Patient not taking: Reported on 02/27/2019 09/01/18   [name], MD     Positive ROS: All other systems have been reviewed and were otherwise negative with the exception of those mentioned in the HPI and as above.  Physical Exam: General: Alert, no acute distress Cardiovascular: No pedal edema Respiratory: No cyanosis, no use of accessory musculature GI: No organomegaly, abdomen is soft and non-tender Skin: No lesions in the area of chief complaint Neurologic: Sensation intact distally Psychiatric: Patient is competent for consent with normal mood and affect Lymphatic: No axillary or cervical lymphadenopathy  MUSCULOSKELETAL: Right knee has medial pain, painful crepitance, with an arc of motion from 3 degrees to 115 degrees.  Assessment: Right anteromedial knee osteoarthritis   Plan: Plan for Procedure(s): UNICOMPARTMENTAL KNEE  The risks benefits and alternatives were discussed with the patient including but not limited to the risks of nonoperative treatment, versus surgical intervention including infection, bleeding, nerve injury,  blood clots, cardiopulmonary complications, morbidity, mortality, among others, and they  were willing to proceed.    Patient's anticipated LOS is less than 2 midnights, meeting these requirements: - Younger than 26 - Lives within 1 hour of care - Has a competent adult at home to recover with post-op recover - NO history of  - Chronic pain requiring opiods  - Diabetes  - Coronary Artery Disease  - Heart failure  - Heart attack  - Stroke  - DVT/VTE  - Cardiac arrhythmia  - Respiratory Failure/COPD  - Renal failure  - Anemia  - Advanced Liver disease        Johnny Bridge, MD Cell [phone]   04/17/2019 7:23 AM

## 2019-04-17 NOTE — Op Note (Signed)
04/17/2019  1:43 PM  PATIENT:  Christina Hernandez    PRE-OPERATIVE DIAGNOSIS: Right knee primary localized anteromedial knee osteoarthritis  POST-OPERATIVE DIAGNOSIS:  Same  PROCEDURE:  Unicompartmental Knee Arthroplasty  SURGEON:  Johnny Bridge, MD  PHYSICIAN ASSISTANT: Joya Gaskins, OPA-C, present and scrubbed throughout the case, critical for completion in a timely fashion, and for retraction, instrumentation, and closure.  ANESTHESIA:   Spinal  ESTIMATED BLOOD LOSS: 150 mL  UNIQUE ASPECTS OF THE CASE: She had some grade II chondromalacia on the medial aspect of the femoral trochlea, as well as some 1 to the medial facet of the patella, although the lateral side was intact.  The ACL had a large osteophyte overlying it, although it was still competent, and I remove the osteophyte.  The lateral compartment had some grade 2 changes, but no full-thickness chondral loss.  The medial side had extensive osteophyte formation as well as grade 4 chondral loss.  PREOPERATIVE INDICATIONS:  Christina Hernandez is a  68 y.o. female with a diagnosis of djd right knee who failed conservative measures and elected for surgical management.    The risks benefits and alternatives were discussed with the patient preoperatively including but not limited to the risks of infection, bleeding, nerve injury, cardiopulmonary complications, blood clots, the need for revision surgery, among others, and the patient was willing to proceed.  OPERATIVE IMPLANTS: Biomet [place] mobile bearing medial compartment arthroplasty femur size small, tibia size AA, bearing size 3.  OPERATIVE FINDINGS: Endstage grade 4 medial compartment osteoarthritis. No significant changes in the lateral or patellofemoral joint.  The ACL was intact.  OPERATIVE PROCEDURE: The patient was brought to the operating room placed in the supine position. General anesthesia was administered. IV antibiotics were given. The lower extremity  was placed in the legholder and prepped and draped in usual sterile fashion.  Time out was performed.  The leg was elevated and exsanguinated and the tourniquet was inflated. Anteromedial incision was performed, and I took care to preserve the MCL. Parapatellar incision was carried out, and the osteophytes were excised, along with the medial meniscus and a small portion of the fat pad.  The extra medullary tibial cutting jig was applied, using the spoon and the 36mm G-Clamp and the 2 mm shim, and I took care to protect the anterior cruciate ligament insertion and the tibial spine. The medial collateral ligament was also protected, and I resected my proximal tibia, matching the anatomic slope.   The proximal tibial bony cut was removed in one piece, and I turned my attention to the femur.  The intramedullary femoral rod was placed using the drill, and then using the appropriate reference, I assembled the femoral jig, setting my posterior cutting block. I resected my posterior femur, used the 0 spigot for the anterior femur, and then measured my gap.   I then used the appropriate mill to match the extension gap to the flexion gap. The second milling was at a 3.  The gaps were then measured again with the appropriate feeler gauges. Once I had balanced flexion and extension gaps, I then completed the preparation of the femur.  I milled off the anterior aspect of the distal femur to prevent impingement. I also exposed the tibia, and selected the above-named component, and then used the cutting jig to prepare the keel slot on the tibia. I also used the awl to curette out the bone to complete the preparation of the keel. The back wall was intact.  I then placed trial components, and it was found to have excellent motion, and appropriate balance.  I then cemented the components into place, cementing the tibia first, removing all excess cement, and then cementing the femur.  All loose cement was  removed.  The real polyethylene insert was applied manually, and the knee was taken through functional range of motion, and found to have excellent stability and restoration of joint motion, with excellent balance.  The wounds were irrigated copiously, and the parapatellar tissue closed with Vicryl, followed by Vicryl for the subcutaneous tissue, with routine closure with Steri-Strips and sterile gauze.  The tourniquet was released, and the patient was awakened and extubated and returned to PACU in stable and satisfactory condition. There were no complications.

## 2019-04-17 NOTE — Discharge Instructions (Signed)
INSTRUCTIONS AFTER JOINT REPLACEMENT  ° °o Remove items at home which could result in a fall. This includes throw rugs or furniture in walking pathways °o ICE to the affected joint every three hours while awake for 30 minutes at a time, for at least the first 3-5 days, and then as needed for pain and swelling.  Continue to use ice for pain and swelling. You may notice swelling that will progress down to the foot and ankle.  This is normal after surgery.  Elevate your leg when you are not up walking on it.   °o Continue to use the breathing machine you got in the hospital (incentive spirometer) which will help keep your temperature down.  It is common for your temperature to cycle up and down following surgery, especially at night when you are not up moving around and exerting yourself.  The breathing machine keeps your lungs expanded and your temperature down. ° ° °DIET:  As you were doing prior to hospitalization, we recommend a well-balanced diet. ° °DRESSING / WOUND CARE / SHOWERING ° °You may change your dressing 3-5 days after surgery.  Then change the dressing every day with sterile gauze.  Please use good hand washing techniques before changing the dressing.  Do not use any lotions or creams on the incision until instructed by your surgeon. ° °ACTIVITY ° °o Increase activity slowly as tolerated, but follow the weight bearing instructions below.   °o No driving for 6 weeks or until further direction given by your physician.  You cannot drive while taking narcotics.  °o No lifting or carrying greater than 10 lbs. until further directed by your surgeon. °o Avoid periods of inactivity such as sitting longer than an hour when not asleep. This helps prevent blood clots.  °o You may return to work once you are authorized by your doctor.  ° ° ° °WEIGHT BEARING  ° °Weight bearing as tolerated with assist device (walker, cane, etc) as directed, use it as long as suggested by your surgeon or therapist, typically at  least 4-6 weeks. ° ° °EXERCISES ° °Results after joint replacement surgery are often greatly improved when you follow the exercise, range of motion and muscle strengthening exercises prescribed by your doctor. Safety measures are also important to protect the joint from further injury. Any time any of these exercises cause you to have increased pain or swelling, decrease what you are doing until you are comfortable again and then slowly increase them. If you have problems or questions, call your caregiver or physical therapist for advice.  ° °Rehabilitation is important following a joint replacement. After just a few days of immobilization, the muscles of the leg can become weakened and shrink (atrophy).  These exercises are designed to build up the tone and strength of the thigh and leg muscles and to improve motion. Often times heat used for twenty to thirty minutes before working out will loosen up your tissues and help with improving the range of motion but do not use heat for the first two weeks following surgery (sometimes heat can increase post-operative swelling).  ° °These exercises can be done on a training (exercise) mat, on the floor, on a table or on a bed. Use whatever works the best and is most comfortable for you.    Use music or television while you are exercising so that the exercises are a pleasant break in your day. This will make your life better with the exercises acting as a break   in your routine that you can look forward to.   Perform all exercises about fifteen times, three times per day or as directed.  You should exercise both the operative leg and the other leg as well. ° °Exercises include: °  °• Quad Sets - Tighten up the muscle on the front of the thigh (Quad) and hold for 5-10 seconds.   °• Straight Leg Raises - With your knee straight (if you were given a brace, keep it on), lift the leg to 60 degrees, hold for 3 seconds, and slowly lower the leg.  Perform this exercise against  resistance later as your leg gets stronger.  °• Leg Slides: Lying on your back, slowly slide your foot toward your buttocks, bending your knee up off the floor (only go as far as is comfortable). Then slowly slide your foot back down until your leg is flat on the floor again.  °• Angel Wings: Lying on your back spread your legs to the side as far apart as you can without causing discomfort.  °• Hamstring Strength:  Lying on your back, push your heel against the floor with your leg straight by tightening up the muscles of your buttocks.  Repeat, but this time bend your knee to a comfortable angle, and push your heel against the floor.  You may put a pillow under the heel to make it more comfortable if necessary.  ° °A rehabilitation program following joint replacement surgery can speed recovery and prevent re-injury in the future due to weakened muscles. Contact your doctor or a physical therapist for more information on knee rehabilitation.  ° ° °CONSTIPATION ° °Constipation is defined medically as fewer than three stools per week and severe constipation as less than one stool per week.  Even if you have a regular bowel pattern at home, your normal regimen is likely to be disrupted due to multiple reasons following surgery.  Combination of anesthesia, postoperative narcotics, change in appetite and fluid intake all can affect your bowels.  ° °YOU MUST use at least one of the following options; they are listed in order of increasing strength to get the job done.  They are all available over the counter, and you may need to use some, POSSIBLY even all of these options:   ° °Drink plenty of fluids (prune juice may be helpful) and high fiber foods °Colace 100 mg by mouth twice a day  °Senokot for constipation as directed and as needed Dulcolax (bisacodyl), take with full glass of water  °Miralax (polyethylene glycol) once or twice a day as needed. ° °If you have tried all these things and are unable to have a bowel  movement in the first 3-4 days after surgery call either your surgeon or your primary doctor.   ° °If you experience loose stools or diarrhea, hold the medications until you stool forms back up.  If your symptoms do not get better within 1 week or if they get worse, check with your doctor.  If you experience "the worst abdominal pain ever" or develop nausea or vomiting, please contact the office immediately for further recommendations for treatment. ° ° °ITCHING:  If you experience itching with your medications, try taking only a single pain pill, or even half a pain pill at a time.  You can also use Benadryl over the counter for itching or also to help with sleep.  ° °TED HOSE STOCKINGS:  Use stockings on both legs until for at least 2 weeks or as   directed by physician office. They may be removed at night for sleeping.  MEDICATIONS:  See your medication summary on the After Visit Summary that nursing will review with you.  You may have some home medications which will be placed on hold until you complete the course of blood thinner medication.  It is important for you to complete the blood thinner medication as prescribed.  PRECAUTIONS:  If you experience chest pain or shortness of breath - call 911 immediately for transfer to the hospital emergency department.   If you develop a fever greater that 101 F, purulent drainage from wound, increased redness or drainage from wound, foul odor from the wound/dressing, or calf pain - CONTACT YOUR SURGEON.                                                   FOLLOW-UP APPOINTMENTS:  If you do not already have a post-op appointment, please call the office for an appointment to be seen by your surgeon.  Guidelines for how soon to be seen are listed in your After Visit Summary, but are typically between 1-4 weeks after surgery.  OTHER INSTRUCTIONS:   Place foam block, curve side up under heel at all times except when in CPM or when walking.  DO NOT modify, tear,  cut, or change the foam block in any way.  MAKE SURE YOU:   Understand these instructions.   Get help right away if you are not doing well or get worse.    Thank you for letting us be a part of your medical care team.  It is a privilege we respect greatly.  We hope these instructions will help you stay on track for a fast and full recovery!

## 2019-04-17 NOTE — Anesthesia Postprocedure Evaluation (Signed)
Anesthesia Post Note  Patient: Janisha Johnson [place]  Procedure(s) Performed: UNICOMPARTMENTAL KNEE (Right Knee)     Patient location during evaluation: PACU Anesthesia Type: MAC Level of consciousness: oriented and awake and alert Pain management: pain level controlled Vital Signs Assessment: post-procedure vital signs reviewed and stable Respiratory status: spontaneous breathing, respiratory function stable and patient connected to nasal cannula oxygen Cardiovascular status: blood pressure returned to baseline and stable Postop Assessment: no headache, no backache and no apparent nausea or vomiting Anesthetic complications: no    Last Vitals:  Vitals:   04/17/19 1335 04/17/19 1436  BP: (!) 151/75 138/70  Pulse: 69 84  Resp: 16 16  Temp:  36.6 C  SpO2: 100% 99%    Last Pain:  Vitals:   04/17/19 1436  TempSrc: Oral  PainSc:                  Aiysha Jillson

## 2019-04-18 DIAGNOSIS — M1711 Unilateral primary osteoarthritis, right knee: Secondary | ICD-10-CM | POA: Diagnosis not present

## 2019-04-18 LAB — GLUCOSE, CAPILLARY
Glucose-Capillary: 109 mg/dL — ABNORMAL HIGH (ref 70–99)
Glucose-Capillary: 112 mg/dL — ABNORMAL HIGH (ref 70–99)
Glucose-Capillary: 110 mg/dL — ABNORMAL HIGH (ref 70–99)

## 2019-04-18 LAB — CBC
HCT: 34.3 % — ABNORMAL LOW (ref 36.0–46.0)
Hemoglobin: 10.7 g/dL — ABNORMAL LOW (ref 12.0–15.0)
MCH: 27.4 pg (ref 26.0–34.0)
MCHC: 31.2 g/dL (ref 30.0–36.0)
MCV: 87.9 fL (ref 80.0–100.0)
Platelets: 218 10*3/uL (ref 150–400)
RBC: 3.9 MIL/uL (ref 3.87–5.11)
RDW: 13.2 % (ref 11.5–15.5)
WBC: 8.2 10*3/uL (ref 4.0–10.5)
nRBC: 0 % (ref 0.0–0.2)

## 2019-04-18 LAB — BASIC METABOLIC PANEL
Anion gap: 10 (ref 5–15)
BUN: 19 mg/dL (ref 8–23)
CO2: 21 mmol/L — ABNORMAL LOW (ref 22–32)
Calcium: 9 mg/dL (ref 8.9–10.3)
Chloride: 107 mmol/L (ref 98–111)
Creatinine, Ser: 0.81 mg/dL (ref 0.44–1.00)
GFR calc Af Amer: >60 mL/min (ref >60)
GFR calc non Af Amer: >60 mL/min (ref >60)
Glucose, Bld: 138 mg/dL — ABNORMAL HIGH (ref 70–99)
Potassium: 3.4 mmol/L — ABNORMAL LOW (ref 3.5–5.1)
Sodium: 138 mmol/L (ref 135–145)

## 2019-04-18 NOTE — Discharge Summary (Signed)
Physician Discharge Summary  Patient ID: Christina Hernandez MRN: [id number] DOB/AGE: Jan 24, 1951 68 y.o.  Admit date: 04/17/2019 Discharge date: 04/18/2019  Admission Diagnoses:  Primary localized osteoarthritis of right knee  Discharge Diagnoses:  Principal Problem:   Primary localized osteoarthritis of right knee   Past Medical History:  Diagnosis Date  . Arthritis   . Breast cancer, right breast [facility])    pending right breast lumpectomy on September 01, 2018  . Diabetes mellitus type II, controlled ([place])    On Kombiglyze  . Family history of ovarian cancer   . Family history of thyroid cancer   . GERD (gastroesophageal reflux disease)    rarely occurs- no meds  . Hyperlipidemia associated with type 2 diabetes mellitus ([place])   . Hypertension   . Hypothyroidism   . Primary localized osteoarthritis of right knee 04/17/2019  . Trigeminal neuralgia of left side of face 2015   s/p Gamma knife    Surgeries: Procedure(s): UNICOMPARTMENTAL KNEE on 04/17/2019   Consultants (if any):   Discharged Condition: Improved  Hospital Course: Christina Hernandez is an 68 y.o. female who was admitted 04/17/2019 with a diagnosis of Primary localized osteoarthritis of right knee and went to the operating room on 04/17/2019 and underwent the above named procedures.    She was given perioperative antibiotics:  Anti-infectives (From admission, onward)   Start     Dose/Rate Route Frequency Ordered Stop   04/17/19 1700  ceFAZolin (ANCEF) IVPB 2g/100 mL premix     2 g 200 mL/hr over 30 Minutes Intravenous Every 6 hours 04/17/19 1344 04/17/19 2245   04/17/19 0830  ceFAZolin (ANCEF) IVPB 2g/100 mL premix     2 g 200 mL/hr over 30 Minutes Intravenous On call to O.R. 04/17/19 [id number] 04/17/19 1029    .  She was given sequential compression devices, early ambulation, and aspirin for DVT prophylaxis.  She benefited maximally from the hospital stay and there were no complications.     Recent vital signs:  Vitals:   04/18/19 0926 04/18/19 0940  BP: 136/62 (!) 144/74  Pulse: 69 72  Resp:  15  Temp: 97.8 F (36.6 C) 97.8 F (36.6 C)  SpO2: 100% 100%    Recent laboratory studies:  Lab Results  Component Value Date   HGB 10.7 (L) 04/18/2019   HGB 12.0 04/10/2019   HGB 11.6 08/10/2018   Lab Results  Component Value Date   WBC 8.2 04/18/2019   PLT 218 04/18/2019   No results found for: INR Lab Results  Component Value Date   NA 138 04/18/2019   K 3.4 (L) 04/18/2019   CL 107 04/18/2019   CO2 21 (L) 04/18/2019   BUN 19 04/18/2019   CREATININE 0.81 04/18/2019   GLUCOSE 138 (H) 04/18/2019    Discharge Medications:   Allergies as of 04/18/2019      Reactions   Cyclobenzaprine Nausea Only      Medication List    STOP taking these medications   acetaminophen 500 MG tablet Commonly known as: TYLENOL   HYDROcodone-acetaminophen 5-325 MG tablet Commonly known as: NORCO/VICODIN Replaced by: HYDROcodone-acetaminophen 10-325 MG tablet     TAKE these medications   anastrozole 1 MG tablet Commonly known as: ARIMIDEX Take 1 tablet (1 mg total) by mouth daily. Start Apr 29, 2019   aspirin EC 325 MG tablet Take 1 tablet (325 mg total) by mouth 2 (two) times daily. Notes to patient: Blood thinner   baclofen 10 MG tablet Commonly known  as: LIORESAL Take 10 mg by mouth 3 (three) times daily.   Biotin Maximum 10000 MCG Tbdp Generic drug: Biotin Take 10,000 mcg by mouth daily.   cetirizine 10 MG tablet Commonly known as: ZYRTEC Take 1 tablet (10 mg total) by mouth daily.   Combigan 0.2-0.5 % ophthalmic solution Generic drug: brimonidine-timolol Place 1 drop into both eyes 2 (two) times daily.   DULoxetine 60 MG capsule Commonly known as: CYMBALTA Take 1 capsule (60 mg total) by mouth daily.   gabapentin 300 MG capsule Commonly known as: NEURONTIN Take 3 capsules (900 mg total) by mouth 3 (three) times daily.   HYDROcodone-acetaminophen  10-325 MG tablet Commonly known as: Norco Take 1 tablet by mouth every 6 (six) hours as needed. Replaces: HYDROcodone-acetaminophen 5-325 MG tablet   Kombiglyze XR 5-500 MG Tb24 Generic drug: Saxagliptin-Metformin TAKE 1 TABLET BY MOUTH DAILY BEFORE SUPPER. What changed: See the new instructions.   latanoprost 0.005 % ophthalmic solution Commonly known as: XALATAN Place 1 drop into both eyes at bedtime.   losartan-hydrochlorothiazide 50-12.5 MG tablet Commonly known as: HYZAAR TAKE 1 TABLET BY MOUTH EVERY DAY   meloxicam 15 MG tablet Commonly known as: MOBIC Take 15 mg by mouth daily.   ondansetron 4 MG tablet Commonly known as: Zofran Take 1 tablet (4 mg total) by mouth every 8 (eight) hours as needed for nausea or vomiting.   pravastatin 40 MG tablet Commonly known as: PRAVACHOL TAKE 1 TABLET BY MOUTH EVERY DAY   sennosides-docusate sodium 8.6-50 MG tablet Commonly known as: SENOKOT-S Take 2 tablets by mouth daily.   Synthroid 75 MCG tablet Generic drug: levothyroxine TAKE 1 TABLET DAILY MON-SAT, HOLD ON SUNDAY. What changed: See the new instructions.   topiramate 50 MG tablet Commonly known as: TOPAMAX Take 100 mg by mouth 2 (two) times daily.   Vitamin D 125 MCG (5000 UT) Caps Take 5,000 Units by mouth daily.       Diagnostic Studies: Dg Knee Right Port  Result Date: 04/17/2019 CLINICAL DATA:  Status post right knee arthroplasty. EXAM: PORTABLE RIGHT KNEE - 1-2 VIEW COMPARISON:  March 27, 2018. FINDINGS: Status post left medial hemiarthroplasty. The femoral and tibial components appear to be well situated. No fracture or dislocation is noted. Expected postoperative changes are noted in the soft tissues anteriorly. IMPRESSION: Status post left medial hemiarthroplasty. Electronically Signed   By: Marijo Conception M.D.   On: 04/17/2019 13:02    Disposition: Discharge disposition: 01-Home or Self Care         Follow-up Information    [name],  MD. Schedule an appointment as soon as possible for a visit in 2 weeks.   Specialty: Orthopedic Surgery Contact information: [address] [place] [address] [phone]            Signed: Johnny Bridge 04/18/2019, 11:53 AM

## 2019-04-18 NOTE — TOC Transition Note (Signed)
Transition of Care [facility]) - CM/SW Discharge Note   Patient Details  Name: Annessa Reichow MRN: [id number] Date of Birth: [birth year]  [facility] CM/SW Contact:  Lia Hopping, [place] Phone Number: 04/18/2019, 9:54 AM   Clinical Narrative:    Therapy Plan: OPPT, starting Friday  Has RW, 3 IN 1 ordered through [place]   Final next level of care: OP Rehab Barriers to Discharge: No Barriers Identified   Patient Goals and CMS Choice Patient states their goals for this hospitalization and ongoing recovery are:: to get better CMS Medicare.gov Compare Post Acute Care list provided to:: Patient Choice offered to / list presented to : Patient  Discharge Placement                       Discharge Plan and Services                DME Arranged: 3-N-1 DME Agency: [employer] Date DME Agency Contacted: 04/18/19 Time DME Agency Contacted: 0900 Representative spoke with at DME Agency: [facility] ([place]) Interventions     Readmission Risk Interventions No flowsheet data found.

## 2019-04-18 NOTE — Progress Notes (Signed)
Physical Therapy Treatment Patient Details Name: Christina Hernandez MRN: [id number] DOB: 14-Sep-1950 Today's Date: 04/18/2019    History of Present Illness Patient is 68 y.o. female s/p Rt TKA on 04/17/19 with PMH significant for hypothyroidism, HTN, HLD, DM, GERD, and Rt breast lumpectomy.    PT Comments    Progressing well with mobility. Reviewed/practiced exercises, gait training, and stair training. Issued HEP for pt to perform 2x/day until she begins OP PT. All education completed. Okay to d/c from PT standpoint.    Follow Up Recommendations  Follow surgeon's recommendation for DC plan and follow-up therapies     Equipment Recommendations  None recommended by PT    Recommendations for Other Services       Precautions / Restrictions Precautions Precautions: Fall Restrictions Weight Bearing Restrictions: No Other Position/Activity Restrictions: WBAT    Mobility  Bed Mobility               General bed mobility comments: oob in recliner  Transfers Overall transfer level: Needs assistance Equipment used: Rolling walker (2 wheeled) Transfers: Sit to/from Stand Sit to Stand: Min guard         General transfer comment: close guard for safety. vcs safety, hand placement.  Ambulation/Gait Ambulation/Gait assistance: Min guard Gait Distance (Feet): 115 Feet Assistive device: Rolling walker (2 wheeled) Gait Pattern/deviations: Step-to pattern;Step-through pattern;Decreased stride length     General Gait Details: close guard for safety.   Stairs Stairs: Yes Stairs assistance: Min guard Stair Management: Forwards;One rail Right;Step to pattern Number of Stairs: 2 General stair comments: Up and over portable stairs x 1. VCs safety, technique, sequence.   Wheelchair Mobility    Modified Rankin (Stroke Patients Only)       Balance Overall balance assessment: Needs assistance         Standing balance support: Bilateral upper extremity  supported Standing balance-Leahy Scale: Poor                              Cognition Arousal/Alertness: Awake/alert Behavior During Therapy: WFL for tasks assessed/performed Overall Cognitive Status: Within Functional Limits for tasks assessed                                        Exercises   General Comments        Pertinent Vitals/Pain Pain Assessment: 0-10 Pain Score: 3  Pain Location: R knee Pain Descriptors / Indicators: Sore;Aching;Discomfort Pain Intervention(s): Monitored during session;Repositioned    Home Living                      Prior Function            PT Goals (current goals can now be found in the care plan section) Progress towards PT goals: Progressing toward goals    Frequency    7X/week      PT Plan Current plan remains appropriate    Co-evaluation              AM-PAC PT "6 Clicks" Mobility   Outcome Measure  Help needed turning from your back to your side while in a flat bed without using bedrails?: A Little Help needed moving from lying on your back to sitting on the side of a flat bed without using bedrails?: A Little Help needed moving to and from a bed to  a chair (including a wheelchair)?: A Little Help needed standing up from a chair using your arms (e.g., wheelchair or bedside chair)?: A Little Help needed to walk in hospital room?: A Little Help needed climbing 3-5 steps with a railing? : A Little 6 Click Score: 18    End of Session Equipment Utilized During Treatment: Gait belt Activity Tolerance: Patient tolerated treatment well Patient left: in chair;with call bell/phone within reach;with family/visitor present   PT Visit Diagnosis: Pain;Other abnormalities of gait and mobility (R26.89) Pain - Right/Left: Right Pain - part of body: Knee     Time: 1340-1359 PT Time Calculation (min) (ACUTE ONLY): 19 min  Charges:  $Gait Training: 8-22 mins $Therapeutic Exercise: 8-22  mins                        Weston Anna, PT Acute Rehabilitation Services Pager: [phone] Office: [phone]

## 2019-04-18 NOTE — Progress Notes (Addendum)
Patient ID: Christina Hernandez, female   DOB: [birth year], 68 y.o.   MRN: [id number]     Subjective:  Patient reports pain as mild.  Patient in the chair and in no acute distress.  Objective:   VITALS:   Vitals:   04/18/19 0127 04/18/19 0506 04/18/19 0926 04/18/19 0940  BP: 127/61 129/64 136/62 (!) 144/74  Pulse: 70 76 69 72  Resp: 14 14  15   Temp: 97.6 F (36.4 C) 97.9 F (36.6 C) 97.8 F (36.6 C) 97.8 F (36.6 C)  TempSrc: Oral Oral Oral Oral  SpO2: 99% 100% 100% 100%  Weight:      Height:        ABD soft Sensation intact distally Dorsiflexion/Plantar flexion intact Incision: dressing C/D/I and no drainage   Lab Results  Component Value Date   WBC 8.2 04/18/2019   HGB 10.7 (L) 04/18/2019   HCT 34.3 (L) 04/18/2019   MCV 87.9 04/18/2019   PLT 218 04/18/2019   BMET    Component Value Date/Time   NA 138 04/18/2019 0300   NA 143 02/27/2019 1257   K 3.4 (L) 04/18/2019 0300   CL 107 04/18/2019 0300   CO2 21 (L) 04/18/2019 0300   GLUCOSE 138 (H) 04/18/2019 0300   BUN 19 04/18/2019 0300   BUN 11 02/27/2019 1257   CREATININE 0.81 04/18/2019 0300   CREATININE 0.77 08/02/2018 0835   CALCIUM 9.0 04/18/2019 0300   GFRNONAA >60 04/18/2019 0300   GFRNONAA >60 08/02/2018 0835   GFRAA >60 04/18/2019 0300   GFRAA >60 08/02/2018 0835     Assessment/Plan: 1 Day Post-Op   Principal Problem:   Primary localized osteoarthritis of right knee   Advance diet Up with therapy DC home after PT this AM WBAT Dry dressing PRN   Lunette Stands 04/18/2019, 10:42 AM  Discussed and agree with above.   [name], MD Cell [phone]

## 2019-04-18 NOTE — Progress Notes (Signed)
Physical Therapy Treatment Patient Details Name: Christina Hernandez MRN: [id number] DOB: June 15, 1951 Today's Date: 04/18/2019    History of Present Illness Patient is 68 y.o. female s/p Rt TKA on 04/17/19 with PMH significant for hypothyroidism, HTN, HLD, DM, GERD, and Rt breast lumpectomy.    PT Comments    Progressing with mobility. Will plan to have a 2nd session prior to d/c later today.    Follow Up Recommendations  Follow surgeon's recommendation for DC plan and follow-up therapies     Equipment Recommendations  None recommended by PT    Recommendations for Other Services       Precautions / Restrictions Precautions Precautions: Fall Restrictions Weight Bearing Restrictions: No Other Position/Activity Restrictions: WBAT    Mobility  Bed Mobility               General bed mobility comments: oob in recliner  Transfers Overall transfer level: Needs assistance Equipment used: Rolling walker (2 wheeled) Transfers: Sit to/from Stand Sit to Stand: Min guard         General transfer comment: close guard for safety. vcs safety, hand placement.  Ambulation/Gait Ambulation/Gait assistance: Min guard Gait Distance (Feet): 100 Feet Assistive device: Rolling walker (2 wheeled) Gait Pattern/deviations: Step-to pattern;Step-through pattern;Decreased stride length     General Gait Details: close guard for safety.   Stairs             Wheelchair Mobility    Modified Rankin (Stroke Patients Only)       Balance Overall balance assessment: Needs assistance         Standing balance support: Bilateral upper extremity supported Standing balance-Leahy Scale: Poor                              Cognition Arousal/Alertness: Awake/alert Behavior During Therapy: WFL for tasks assessed/performed Overall Cognitive Status: Within Functional Limits for tasks assessed                                        Exercises  Total Joint Exercises Ankle Circles/Pumps: AROM;10 reps;Seated;Both Quad Sets: AROM;10 reps;Right;Seated Hip ABduction/ADduction: AROM;Right;10 reps;Seated Straight Leg Raises: AROM;Right;10 reps;Supine Knee Flexion: AROM;Right;10 reps;Seated Goniometric ROM: ~10-70 degrees    General Comments        Pertinent Vitals/Pain Pain Assessment: 0-10 Pain Score: 5  Pain Location: R knee Pain Descriptors / Indicators: Sore;Aching;Discomfort Pain Intervention(s): Monitored during session;Ice applied;Repositioned    Home Living                      Prior Function            PT Goals (current goals can now be found in the care plan section) Progress towards PT goals: Progressing toward goals    Frequency    7X/week      PT Plan Current plan remains appropriate    Co-evaluation              AM-PAC PT "6 Clicks" Mobility   Outcome Measure  Help needed turning from your back to your side while in a flat bed without using bedrails?: A Little Help needed moving from lying on your back to sitting on the side of a flat bed without using bedrails?: A Little Help needed moving to and from a bed to a chair (including a wheelchair)?: A Little Help  needed standing up from a chair using your arms (e.g., wheelchair or bedside chair)?: A Little Help needed to walk in hospital room?: A Little Help needed climbing 3-5 steps with a railing? : A Little 6 Click Score: 18    End of Session Equipment Utilized During Treatment: Gait belt Activity Tolerance: Patient tolerated treatment well Patient left: in chair;with call bell/phone within reach   PT Visit Diagnosis: Pain;Other abnormalities of gait and mobility (R26.89) Pain - Right/Left: Right     Time: [id number] PT Time Calculation (min) (ACUTE ONLY): 31 min  Charges:  $Gait Training: 8-22 mins $Therapeutic Exercise: 8-22 mins                       Weston Anna, PT Acute Rehabilitation Services Pager:  [phone] Office: [phone]

## 2019-04-18 NOTE — Plan of Care (Signed)
  Problem: Education: Goal: Knowledge of the prescribed therapeutic regimen will improve Outcome: Adequate for Discharge Goal: Individualized Educational Video(s) Outcome: Adequate for Discharge   Problem: Activity: Goal: Ability to avoid complications of mobility impairment will improve Outcome: Adequate for Discharge Goal: Range of joint motion will improve Outcome: Adequate for Discharge   Problem: Clinical Measurements: Goal: Postoperative complications will be avoided or minimized Outcome: Adequate for Discharge   Problem: Pain Management: Goal: Pain level will decrease with appropriate interventions Outcome: Adequate for Discharge   Problem: Skin Integrity: Goal: Will show signs of wound healing Outcome: Adequate for Discharge   Problem: Clinical Measurements: Goal: Will remain free from infection Outcome: Adequate for Discharge Goal: Diagnostic test results will improve Outcome: Adequate for Discharge   Problem: Activity: Goal: Risk for activity intolerance will decrease Outcome: Adequate for Discharge   Problem: Nutrition: Goal: Adequate nutrition will be maintained Outcome: Adequate for Discharge   Problem: Coping: Goal: Level of anxiety will decrease Outcome: Adequate for Discharge   Problem: Elimination: Goal: Will not experience complications related to bowel motility Outcome: Adequate for Discharge Goal: Will not experience complications related to urinary retention Outcome: Adequate for Discharge

## 2019-04-21 ENCOUNTER — Encounter: Payer: Self-pay | Admitting: Internal Medicine

## 2019-04-23 ENCOUNTER — Encounter (HOSPITAL_COMMUNITY): Payer: Self-pay | Admitting: Orthopedic Surgery

## 2019-04-24 ENCOUNTER — Encounter: Payer: Self-pay | Admitting: Internal Medicine

## 2019-05-16 ENCOUNTER — Encounter: Payer: Medicare Other | Admitting: Internal Medicine

## 2019-05-16 ENCOUNTER — Ambulatory Visit: Payer: Medicare Other

## 2019-05-17 ENCOUNTER — Encounter: Payer: Medicare Other | Admitting: Internal Medicine

## 2019-05-17 ENCOUNTER — Ambulatory Visit: Payer: Medicare Other

## 2019-05-21 ENCOUNTER — Telehealth: Payer: Self-pay | Admitting: Adult Health

## 2019-05-21 NOTE — Telephone Encounter (Signed)
I did speak with patients husband regarding video visit

## 2019-06-05 ENCOUNTER — Other Ambulatory Visit: Payer: Self-pay

## 2019-06-05 ENCOUNTER — Ambulatory Visit: Payer: Medicare Other | Admitting: Internal Medicine

## 2019-06-05 ENCOUNTER — Encounter: Payer: Self-pay | Admitting: *Deleted

## 2019-06-05 ENCOUNTER — Encounter: Payer: Self-pay | Admitting: Internal Medicine

## 2019-06-05 VITALS — BP 114/66 | HR 100 | Temp 97.4°F | Ht 64.0 in | Wt 172.8 lb

## 2019-06-05 DIAGNOSIS — H6123 Impacted cerumen, bilateral: Secondary | ICD-10-CM

## 2019-06-05 DIAGNOSIS — E039 Hypothyroidism, unspecified: Secondary | ICD-10-CM

## 2019-06-05 DIAGNOSIS — Z23 Encounter for immunization: Secondary | ICD-10-CM

## 2019-06-05 DIAGNOSIS — I10 Essential (primary) hypertension: Secondary | ICD-10-CM | POA: Diagnosis not present

## 2019-06-05 DIAGNOSIS — E663 Overweight: Secondary | ICD-10-CM

## 2019-06-05 DIAGNOSIS — E1165 Type 2 diabetes mellitus with hyperglycemia: Secondary | ICD-10-CM | POA: Diagnosis not present

## 2019-06-05 DIAGNOSIS — Z0000 Encounter for general adult medical examination without abnormal findings: Secondary | ICD-10-CM | POA: Diagnosis not present

## 2019-06-05 MED ORDER — PNEUMOCOCCAL 13-VAL CONJ VACC IM SUSP: 0.5000 mL | INTRAMUSCULAR | 0 refills | Status: AC

## 2019-06-05 MED ORDER — ONETOUCH VERIO VI STRP: ORAL_STRIP | 2 refills | Status: DC

## 2019-06-05 MED ORDER — ONETOUCH DELICA LANCETS 33G MISC: 2 refills | Status: DC

## 2019-06-05 NOTE — Patient Instructions (Signed)
Health Maintenance, Female Adopting a healthy lifestyle and getting preventive care are important in promoting health and wellness. Ask your health care provider about:  The right schedule for you to have regular tests and exams.  Things you can do on your own to prevent diseases and keep yourself healthy. What should I know about diet, weight, and exercise? Eat a healthy diet   Eat a diet that includes plenty of vegetables, fruits, low-fat dairy products, and lean protein.  Do not eat a lot of foods that are high in solid fats, added sugars, or sodium. Maintain a healthy weight Body mass index (BMI) is used to identify weight problems. It estimates body fat based on height and weight. Your health care provider can help determine your BMI and help you achieve or maintain a healthy weight. Get regular exercise Get regular exercise. This is one of the most important things you can do for your health. Most adults should:  Exercise for at least 150 minutes each week. The exercise should increase your heart rate and make you sweat (moderate-intensity exercise).  Do strengthening exercises at least twice a week. This is in addition to the moderate-intensity exercise.  Spend less time sitting. Even light physical activity can be beneficial. Watch cholesterol and blood lipids Have your blood tested for lipids and cholesterol at 70 years of age, then have this test every 5 years. Have your cholesterol levels checked more often if:  Your lipid or cholesterol levels are high.  You are older than 70 years of age.  You are at high risk for heart disease. What should I know about cancer screening? Depending on your health history and family history, you may need to have cancer screening at various ages. This may include screening for:  Breast cancer.  Cervical cancer.  Colorectal cancer.  Skin cancer.  Lung cancer. What should I know about heart disease, diabetes, and high blood  pressure? Blood pressure and heart disease  High blood pressure causes heart disease and increases the risk of stroke. This is more likely to develop in people who have high blood pressure readings, are of African descent, or are overweight.  Have your blood pressure checked: ? Every 3-5 years if you are 18-39 years of age. ? Every year if you are 70 years old or older. Diabetes Have regular diabetes screenings. This checks your fasting blood sugar level. Have the screening done:  Once every three years after age 70 if you are at a normal weight and have a low risk for diabetes.  More often and at a younger age if you are overweight or have a high risk for diabetes. What should I know about preventing infection? Hepatitis B If you have a higher risk for hepatitis B, you should be screened for this virus. Talk with your health care provider to find out if you are at risk for hepatitis B infection. Hepatitis C Testing is recommended for:  Everyone born from 1945 through 1965.  Anyone with known risk factors for hepatitis C. Sexually transmitted infections (STIs)  Get screened for STIs, including gonorrhea and chlamydia, if: ? You are sexually active and are younger than 70 years of age. ? You are older than 70 years of age and your health care provider tells you that you are at risk for this type of infection. ? Your sexual activity has changed since you were last screened, and you are at increased risk for chlamydia or gonorrhea. Ask your health care provider if   you are at risk.  Ask your health care provider about whether you are at high risk for HIV. Your health care provider may recommend a prescription medicine to help prevent HIV infection. If you choose to take medicine to prevent HIV, you should first get tested for HIV. You should then be tested every 3 months for as long as you are taking the medicine. Pregnancy  If you are about to stop having your period (premenopausal) and  you may become pregnant, seek counseling before you get pregnant.  Take 400 to 800 micrograms (mcg) of folic acid every day if you become pregnant.  Ask for birth control (contraception) if you want to prevent pregnancy. Osteoporosis and menopause Osteoporosis is a disease in which the bones lose minerals and strength with aging. This can result in bone fractures. If you are 65 years old or older, or if you are at risk for osteoporosis and fractures, ask your health care provider if you should:  Be screened for bone loss.  Take a calcium or vitamin D supplement to lower your risk of fractures.  Be given hormone replacement therapy (HRT) to treat symptoms of menopause. Follow these instructions at home: Lifestyle  Do not use any products that contain nicotine or tobacco, such as cigarettes, e-cigarettes, and chewing tobacco. If you need help quitting, ask your health care provider.  Do not use street drugs.  Do not share needles.  Ask your health care provider for help if you need support or information about quitting drugs. Alcohol use  Do not drink alcohol if: ? Your health care provider tells you not to drink. ? You are pregnant, may be pregnant, or are planning to become pregnant.  If you drink alcohol: ? Limit how much you use to 0-1 drink a day. ? Limit intake if you are breastfeeding.  Be aware of how much alcohol is in your drink. In the [place] one drink equals one 12 oz bottle of beer (355 mL), one 5 oz glass of wine (148 mL), or one 1 oz glass of hard liquor (44 mL). General instructions  Schedule regular health, dental, and eye exams.  Stay current with your vaccines.  Tell your health care provider if: ? You often feel depressed. ? You have ever been abused or do not feel safe at home. Summary  Adopting a healthy lifestyle and getting preventive care are important in promoting health and wellness.  Follow your health care provider's instructions about healthy  diet, exercising, and getting tested or screened for diseases.  Follow your health care provider's instructions on monitoring your cholesterol and blood pressure. This information is not intended to replace advice given to you by your health care provider. Make sure you discuss any questions you have with your health care provider. Document Released: 12/28/2010 Document Revised: 06/07/2018 Document Reviewed: 06/07/2018 Elsevier Patient Education  2020 [employer] Inc.  

## 2019-06-05 NOTE — Progress Notes (Signed)
This visit occurred during the SARS-CoV-2 public health emergency.  Safety protocols were in place, including screening questions prior to the visit, additional usage of staff PPE, and extensive cleaning of exam room while observing appropriate contact time as indicated for disinfecting solutions.  Subjective:     Patient ID: Christina Hernandez , female    DOB: 1951/06/27 , 68 y.o.   MRN: [id number]   Chief Complaint  Patient presents with  . Annual Exam  . Diabetes  . Hypertension    HPI  She is here today for a full physical examination. She is followed by Dr. Simona Huh for her GYN exams. She was last seen in 2019.   Diabetes She presents for her follow-up diabetic visit. She has type 2 diabetes mellitus. There are no hypoglycemic associated symptoms. There are no diabetic associated symptoms. Pertinent negatives for diabetes include no blurred vision and no chest pain. There are no hypoglycemic complications. Risk factors for coronary artery disease include diabetes mellitus, dyslipidemia, hypertension, sedentary lifestyle and post-menopausal. She participates in exercise intermittently. An ACE inhibitor/angiotensin II receptor blocker is being taken. Eye exam is current.  Hypertension This is a chronic problem. The current episode started more than 1 year ago. The problem has been gradually improving since onset. The problem is controlled. Pertinent negatives include no blurred vision, chest pain, palpitations or shortness of breath. Compliance problems include exercise.      Past Medical History:  Diagnosis Date  . Arthritis   . Breast cancer, right breast [facility])    pending right breast lumpectomy on September 01, 2018  . Diabetes mellitus type II, controlled ([place])    On Kombiglyze  . Family history of ovarian cancer   . Family history of thyroid cancer   . GERD (gastroesophageal reflux disease)    rarely occurs- no meds  . Hyperlipidemia associated with type 2 diabetes mellitus  ([place])   . Hypertension   . Hypothyroidism   . Primary localized osteoarthritis of right knee 04/17/2019  . Trigeminal neuralgia of left side of face 2015   s/p Gamma knife     Family History  Problem Relation Age of Onset  . Hypertension Mother   . Kidney failure Mother   . Congestive Heart Failure Mother   . Heart Problems Father   . Stroke Father   . Pneumonia Father   . Heart Problems Sister 40       heart transplant  . Stroke Paternal Grandmother   . Dementia Paternal Grandmother   . Stroke Paternal Grandfather   . Cardiomyopathy Sister   . Ovarian cancer Sister 41       d. 9  . Thyroid disease Sister   . Thyroid disease Sister   . Thyroid cancer Niece 78  . Breast cancer Neg Hx      Current Outpatient Medications:  .  anastrozole (ARIMIDEX) 1 MG tablet, Take 1 tablet (1 mg total) by mouth daily. Start Apr 29, 2019, Disp: 90 tablet, Rfl: 4 .  COMBIGAN 0.2-0.5 % ophthalmic solution, Place 1 drop into both eyes 2 (two) times daily., Disp: , Rfl:  .  DULoxetine (CYMBALTA) 60 MG capsule, Take 1 capsule (60 mg total) by mouth daily., Disp: 90 capsule, Rfl: 4 .  gabapentin (NEURONTIN) 300 MG capsule, Take 3 capsules (900 mg total) by mouth 3 (three) times daily., Disp: 810 capsule, Rfl: 4 .  HYDROcodone-acetaminophen (NORCO) 10-325 MG tablet, Take 1 tablet by mouth every 6 (six) hours as needed., Disp: 28 tablet,  Rfl: 0 .  KOMBIGLYZE XR 5-500 MG TB24, TAKE 1 TABLET BY MOUTH DAILY BEFORE SUPPER. (Patient taking differently: Take 1 tablet by mouth daily before supper. ), Disp: 90 tablet, Rfl: 1 .  latanoprost (XALATAN) 0.005 % ophthalmic solution, Place 1 drop into both eyes at bedtime., Disp: , Rfl:  .  losartan-hydrochlorothiazide (HYZAAR) 50-12.5 MG tablet, TAKE 1 TABLET BY MOUTH EVERY DAY (Patient taking differently: Take 1 tablet by mouth daily. ), Disp: 90 tablet, Rfl: 1 .  meloxicam (MOBIC) 15 MG tablet, Take 15 mg by mouth daily., Disp: , Rfl:  .  pravastatin (PRAVACHOL)  40 MG tablet, TAKE 1 TABLET BY MOUTH EVERY DAY (Patient taking differently: Take 40 mg by mouth daily. ), Disp: 90 tablet, Rfl: 2 .  SYNTHROID 75 MCG tablet, TAKE 1 TABLET DAILY MON-SAT, HOLD ON SUNDAY. (Patient taking differently: Take 75 mcg by mouth See admin instructions. Take 75 mcg by mouth daily Monday - Saturday and hold on Sunday), Disp: 26 tablet, Rfl: 2 .  aspirin EC 325 MG tablet, Take 1 tablet (325 mg total) by mouth 2 (two) times daily. (Patient not taking: Reported on 06/05/2019), Disp: 60 tablet, Rfl: 0 .  baclofen (LIORESAL) 10 MG tablet, Take 10 mg by mouth 3 (three) times daily. , Disp: , Rfl:  .  Biotin (BIOTIN MAXIMUM) 10000 MCG TBDP, Take 10,000 mcg by mouth daily. , Disp: , Rfl:  .  cetirizine (ZYRTEC) 10 MG tablet, Take 1 tablet (10 mg total) by mouth daily. (Patient not taking: Reported on 04/06/2019), Disp: 30 tablet, Rfl: 3 .  Cholecalciferol (VITAMIN D) 125 MCG (5000 UT) CAPS, Take 5,000 Units by mouth daily., Disp: , Rfl:  .  ondansetron (ZOFRAN) 4 MG tablet, Take 1 tablet (4 mg total) by mouth every 8 (eight) hours as needed for nausea or vomiting. (Patient not taking: Reported on 06/05/2019), Disp: 10 tablet, Rfl: 0 .  sennosides-docusate sodium (SENOKOT-S) 8.6-50 MG tablet, Take 2 tablets by mouth daily. (Patient not taking: Reported on 06/05/2019), Disp: 30 tablet, Rfl: 1 .  topiramate (TOPAMAX) 50 MG tablet, Take 100 mg by mouth 2 (two) times daily. , Disp: , Rfl:    Allergies  Allergen Reactions  . Cyclobenzaprine Nausea Only     The patient states she uses post menopausal status for birth control. Last LMP was No LMP recorded. Patient is postmenopausal.. Negative for Dysmenorrhea Negative for: breast discharge, breast lump(s), breast pain and breast self exam. Associated symptoms include abnormal vaginal bleeding. Pertinent negatives include abnormal bleeding (hematology), anxiety, decreased libido, depression, difficulty falling sleep, dyspareunia, history of  infertility, nocturia, sexual dysfunction, sleep disturbances, urinary incontinence, urinary urgency, vaginal discharge and vaginal itching. Diet regular.The patient states her exercise level is  intermittent.   . The patient's tobacco use is:  Social History   Tobacco Use  Smoking Status Never Smoker  Smokeless Tobacco Never Used  . She has been exposed to passive smoke. The patient's alcohol use is:  Social History   Substance and Sexual Activity  Alcohol Use No    Review of Systems  Constitutional: Negative.   HENT: Negative.   Eyes: Negative.  Negative for blurred vision.  Respiratory: Negative.  Negative for shortness of breath.   Cardiovascular: Negative.  Negative for chest pain and palpitations.  Endocrine: Negative.   Genitourinary: Negative.   Musculoskeletal: Negative.   Skin: Negative.   Allergic/Immunologic: Negative.   Neurological: Negative.   Hematological: Negative.   Psychiatric/Behavioral: Negative.  Today's Vitals   06/05/19 1420  BP: 114/66  Pulse: 100  Temp: (!) 97.4 F (36.3 C)  TempSrc: Oral  Weight: 172 lb 12.8 oz (78.4 kg)  Height: _0  (1.626 m)  PainSc: 0-No pain   Body mass index is 29.66 kg/m.   Objective:  Physical Exam Vitals signs and nursing note reviewed.  Constitutional:      Appearance: Normal appearance.  HENT:     Head: Normocephalic and atraumatic.     Right Ear: Ear canal and external ear normal. There is impacted cerumen.     Left Ear: Ear canal and external ear normal. There is impacted cerumen.     Ears:     Comments: B/l hard cerumen    Nose:     Comments: Deferred, masked    Mouth/Throat:     Comments: Deferred, masked Eyes:     Extraocular Movements: Extraocular movements intact.     Conjunctiva/sclera: Conjunctivae normal.     Pupils: Pupils are equal, round, and reactive to light.  Neck:     Musculoskeletal: Normal range of motion and neck supple.  Cardiovascular:     Rate and Rhythm: Normal rate  and regular rhythm.     Pulses: Normal pulses.          Dorsalis pedis pulses are 2+ on the right side and 2+ on the left side.       Posterior tibial pulses are 2+ on the right side and 2+ on the left side.     Heart sounds: Normal heart sounds.  Pulmonary:     Effort: Pulmonary effort is normal.     Breath sounds: Normal breath sounds.  Abdominal:     General: Abdomen is flat. Bowel sounds are normal.     Palpations: Abdomen is soft.  Genitourinary:    Comments: deferred Musculoskeletal: Normal range of motion.  Feet:     Right foot:     Protective Sensation: 5 sites tested. 5 sites sensed.     Skin integrity: Skin integrity normal.     Toenail Condition: Right toenails are normal.     Left foot:     Protective Sensation: 5 sites tested. 5 sites sensed.     Skin integrity: Skin integrity normal.     Toenail Condition: Left toenails are normal.  Skin:    General: Skin is warm and dry.  Neurological:     General: No focal deficit present.     Mental Status: She is alert and oriented to person, place, and time.  Psychiatric:        Mood and Affect: Mood normal.        Behavior: Behavior normal.         Assessment And Plan:     1. Routine general medical examination at health care facility  A full exam was performed. Importance of monthly self breast exams was discussed with the patient. PATIENT HAS BEEN ADVISED TO GET 30-45 MINUTES REGULAR EXERCISE NO LESS THAN FOUR TO FIVE DAYS PER WEEK - BOTH WEIGHTBEARING EXERCISES AND AEROBIC ARE RECOMMENDED.  SHE WAS ADVISED TO FOLLOW A HEALTHY DIET WITH AT LEAST SIX FRUITS/VEGGIES PER DAY, DECREASE INTAKE OF RED MEAT, AND TO INCREASE FISH INTAKE TO TWO DAYS PER WEEK.  MEATS/FISH SHOULD NOT BE FRIED, BAKED OR BROILED IS PREFERABLE.  I SUGGEST WEARING SPF 50 SUNSCREEN ON EXPOSED PARTS AND ESPECIALLY WHEN IN THE DIRECT SUNLIGHT FOR AN EXTENDED PERIOD OF TIME.  PLEASE AVOID FA[employer] FOOD REST[employer]URANTS AND INCREASE  YOUR WATER INTAKE.   2.  Uncontrolled type 2 diabetes mellitus with hyperglycemia (HCC)  Diabetic foot exam was performed. I DISCUSSED WITH THE PATIENT AT LENGTH REGARDING THE GOALS OF GLYCEMIC CONTROL AND POSSIBLE LONG-TERM COMPLICATIONS.  I  ALSO STRESSED THE IMPORTANCE OF COMPLIANCE WITH HOME GLUCOSE MONITORING, DIETARY RESTRICTIONS INCLUDING AVOIDANCE OF SUGARY DRINKS/PROCESSED FOODS,  ALONG WITH REGULAR EXERCISE.  I  ALSO STRESSED THE IMPORTANCE OF ANNUAL EYE EXAMS, SELF FOOT CARE AND COMPLIANCE WITH OFFICE VISITS.  - EKG 12-Lead - CMP14+EGFR - Hemoglobin A1c  3. Essential hypertension  Chronic, well controlled. She will continue with current meds. She is encouraged to avoid adding salt to her foods. EKG performed, NSR w/ nonspecitic T wave abnormality - no new changes noted. She will rot in six months for re-evaluation.   4. Bilateral impacted cerumen  AFTER OBTAINING VERBAL CONSENT, BOTH EARS WERE FLUSHED BY IRRIGATION. SHE TOLERATED PROCEDURE WELL WITHOUT ANY COMPLICATIONS. NO TM ABNORMALITIES WERE NOTED.  - Ear Lavage  5. Hypothyroidism, unspecified type  I will check thyroid panel and adjust meds as needed.  - TSH - T4, Free  6. Encounter for immunization  She was given rx Prevnar-13. She verbalizes understanding that the pharmacist will administer the vaccine.   7. Overweight (BMI 25.0-29.9)  BMI 29. She is encouraged to strive for BMI less than 26 to decrease cardiac risk.           [name], MD    THE PATIENT IS ENCOURAGED TO PRACTICE SOCIAL DISTANCING DUE TO THE COVID-19 PANDEMIC.

## 2019-06-06 LAB — T4, FREE: Free T4: 1.17 ng/dL (ref 0.82–1.77)

## 2019-06-06 LAB — CMP14+EGFR
ALT: 18 IU/L (ref 0–32)
AST: 21 IU/L (ref 0–40)
Albumin/Globulin Ratio: 1.8 (ref 1.2–2.2)
Albumin: 4.4 g/dL (ref 3.8–4.8)
Alkaline Phosphatase: 107 IU/L (ref 39–117)
BUN/Creatinine Ratio: 18 (ref 12–28)
BUN: 14 mg/dL (ref 8–27)
Bilirubin Total: 0.5 mg/dL (ref 0.0–1.2)
CO2: 25 mmol/L (ref 20–29)
Calcium: 9.6 mg/dL (ref 8.7–10.3)
Chloride: 104 mmol/L (ref 96–106)
Creatinine, Ser: 0.78 mg/dL (ref 0.57–1.00)
GFR calc Af Amer: 89 mL/min/{1.73_m2} (ref >59)
GFR calc non Af Amer: 77 mL/min/{1.73_m2} (ref >59)
Globulin, Total: 2.5 g/dL (ref 1.5–4.5)
Glucose: 81 mg/dL (ref 65–99)
Potassium: 4.3 mmol/L (ref 3.5–5.2)
Sodium: 143 mmol/L (ref 134–144)
Total Protein: 6.9 g/dL (ref 6.0–8.5)

## 2019-06-06 LAB — HEMOGLOBIN A1C
Est. average glucose Bld gHb Est-mCnc: 123 mg/dL
Hgb A1c MFr Bld: 5.9 % — ABNORMAL HIGH (ref 4.8–5.6)

## 2019-06-06 LAB — TSH: TSH: 1.36 u[IU]/mL (ref 0.450–4.500)

## 2019-06-11 ENCOUNTER — Encounter: Payer: Self-pay | Admitting: Adult Health

## 2019-06-11 ENCOUNTER — Inpatient Hospital Stay: Payer: Medicare Other | Attending: Oncology | Admitting: Adult Health

## 2019-06-11 DIAGNOSIS — D0511 Intraductal carcinoma in situ of right breast: Secondary | ICD-10-CM

## 2019-06-11 NOTE — Progress Notes (Signed)
SURVIVORSHIP VIRTUAL VISIT:  I connected with Christina Hernandez on 06/11/19 at 10:00 AM EST by telephone and verified that I am speaking with the correct person using two identifiers.  I discussed the limitations, risks, security and privacy concerns of performing an evaluation and management service by telephone and the availability of in person appointments. I also discussed with the patient that there may be a patient responsible charge related to this service. The patient expressed understanding and agreed to proceed.   Patient Location: At home in private location Provider Location: [place] in private office  BRIEF ONCOLOGIC HISTORY:  Oncology History  Ductal carcinoma in situ (DCIS) of right breast  07/27/2018 Initial Diagnosis   Ductal carcinoma in situ (DCIS) of right breast   08/27/2018 Genetic Testing   Negative genetic testing on the STAT panel and the common hereditary cancer gene panel.  The Hereditary Gene Panel offered by [facility] includes sequencing and/or deletion duplication testing of the following 47 genes: APC, ATM, AXIN2, BARD1, BMPR1A, BRCA1, BRCA2, BRIP1, CDH1, CDK4, CDKN2A (p14ARF), CDKN2A (p16INK4a), CHEK2, CTNNA1, DICER1, EPCAM (Deletion/duplication testing only), GREM1 (promoter region deletion/duplication testing only), KIT, MEN1, MLH1, MSH2, MSH3, MSH6, MUTYH, NBN, NF1, NHTL1, PALB2, PDGFRA, PMS2, POLD1, POLE, PTEN, RAD50, RAD51C, RAD51D, SDHB, SDHC, SDHD, SMAD4, SMARCA4. STK11, TP53, TSC1, TSC2, and VHL.  The following genes were evaluated for sequence changes only: SDHA and HOXB13 c.251G>A variant only. The report date is 08/27/2018.   09/20/2018 Cancer Staging   Staging form: Breast, AJCC 8th Edition - Pathologic: Stage 0 (pTis (DCIS), pN0, cM0, ER+, PR+) - Signed by Gardenia Phlegm, NP on 09/20/2018     INTERVAL HISTORY:  Christina Hernandez to review her survivorship care plan detailing her treatment course for breast cancer, as well as monitoring long-term side effects  of that treatment, education regarding health maintenance, screening, and overall wellness and health promotion.     Overall, Christina Hernandez reports feeling quite well.  She is taking Anastrozole daily and is tolerating it well.  She cannot tell whether or not she is experiencing hot flashes or arthralgias.  She has no vaginal dryness.  She has remianed active and has no other concerns.    REVIEW OF SYSTEMS:  Review of Systems  Constitutional: Negative for appetite change, chills, fatigue, fever and unexpected weight change.  HENT:   Negative for hearing loss, lump/mass, sore throat and trouble swallowing.   Eyes: Negative for eye problems and icterus.  Respiratory: Negative for chest tightness, cough and shortness of breath.   Cardiovascular: Negative for chest pain, leg swelling and palpitations.  Gastrointestinal: Negative for abdominal distention, abdominal pain, constipation, diarrhea, nausea and vomiting.  Endocrine: Negative for hot flashes.  Genitourinary: Negative for difficulty urinating.   Musculoskeletal: Negative for arthralgias.  Skin: Negative for itching and rash.  Neurological: Negative for dizziness, extremity weakness, headaches and numbness.  Hematological: Negative for adenopathy. Does not bruise/bleed easily.  Psychiatric/Behavioral: Negative for depression. The patient is not nervous/anxious.   Breast: Denies any new nodularity, masses, tenderness, nipple changes, or nipple discharge.      ONCOLOGY TREATMENT TEAM:  1. Surgeon:  Dr. Lucia Gaskins at [facility] 2. Medical Oncologist: Dr. Jana Hakim  3. Radiation Oncologist: Dr. Lisbeth Renshaw    PAST MEDICAL/SURGICAL HISTORY:  Past Medical History:  Diagnosis Date  . Arthritis   . Breast cancer, right breast [facility])    pending right breast lumpectomy on September 01, 2018  . Diabetes mellitus type II, controlled ([place])    On Kombiglyze  .  Family history of ovarian cancer   . Family history of thyroid cancer   . GERD  (gastroesophageal reflux disease)    rarely occurs- no meds  . Hyperlipidemia associated with type 2 diabetes mellitus ([place])   . Hypertension   . Hypothyroidism   . Primary localized osteoarthritis of right knee 04/17/2019  . Trigeminal neuralgia of left side of face 2015   s/p Gamma knife   Past Surgical History:  Procedure Laterality Date  . BREAST LUMPECTOMY WITH RADIOACTIVE SEED LOCALIZATION Right 09/01/2018   Procedure: RIGHT BREAST LUMPECTOMY WITH RADIOACTIVE SEED LOCALIZATION;  Surgeon: [name], MD;  Location: [facility];  Service: General;  Laterality: Right;  . COLONOSCOPY    . Gamma Knife Left 04/08/2016  . HYSTEROSCOPY W/D&C  05/24/2011   Procedure: DILATATION AND CURETTAGE (D&C) /HYSTEROSCOPY;  Surgeon: [name], MD;  Location: [facility];  Service: Gynecology;  Laterality: N/A;  . KNEE SURGERY Right 06/10/15   torn ligament  . PARTIAL KNEE ARTHROPLASTY Right 04/17/2019   Procedure: UNICOMPARTMENTAL KNEE;  Surgeon: [name], MD;  Location: [facility];  Service: Orthopedics;  Laterality: Right;  . THYROIDECTOMY N/A 09/08/2016   Procedure: TOTAL THYROIDECTOMY;  Surgeon: [name], MD;  Location: [facility];  Service: ENT;  Laterality: N/A;  . TUBAL LIGATION    . uterine polyp    . WISDOM TOOTH EXTRACTION Left 2014     ALLERGIES:  Allergies  Allergen Reactions  . Cyclobenzaprine Nausea Only     CURRENT MEDICATIONS:  Outpatient Encounter Medications as of 06/11/2019  Medication Sig Note  . anastrozole (ARIMIDEX) 1 MG tablet Take 1 tablet (1 mg total) by mouth daily. Start Apr 29, 2019   . aspirin EC 325 MG tablet Take 1 tablet (325 mg total) by mouth 2 (two) times daily. (Patient not taking: Reported on 06/05/2019)   . baclofen (LIORESAL) 10 MG tablet Take 10 mg by mouth 3 (three) times daily.    . Biotin (BIOTIN MAXIMUM) 10000 MCG TBDP Take 10,000 mcg by mouth daily.  04/10/2019: Pt plan to hold  . cetirizine (ZYRTEC) 10 MG tablet Take 1 tablet (10 mg  total) by mouth daily. (Patient not taking: Reported on 04/06/2019)   . Cholecalciferol (VITAMIN D) 125 MCG (5000 UT) CAPS Take 5,000 Units by mouth daily. 04/10/2019: Pt plan to hold  . COMBIGAN 0.2-0.5 % ophthalmic solution Place 1 drop into both eyes 2 (two) times daily.   . DULoxetine (CYMBALTA) 60 MG capsule Take 1 capsule (60 mg total) by mouth daily.   Marland Kitchen gabapentin (NEURONTIN) 300 MG capsule Take 3 capsules (900 mg total) by mouth 3 (three) times daily.   Marland Kitchen glucose blood (ONETOUCH VERIO) test strip Use as directed to check blood sugars daily dx: e11.22   . HYDROcodone-acetaminophen (NORCO) 10-325 MG tablet Take 1 tablet by mouth every 6 (six) hours as needed.   Marland Kitchen KOMBIGLYZE XR 5-500 MG TB24 TAKE 1 TABLET BY MOUTH DAILY BEFORE SUPPER. (Patient taking differently: Take 1 tablet by mouth daily before supper. )   . latanoprost (XALATAN) 0.005 % ophthalmic solution Place 1 drop into both eyes at bedtime.   Marland Kitchen losartan-hydrochlorothiazide (HYZAAR) 50-12.5 MG tablet TAKE 1 TABLET BY MOUTH EVERY DAY (Patient taking differently: Take 1 tablet by mouth daily. )   . meloxicam (MOBIC) 15 MG tablet Take 15 mg by mouth daily. 04/10/2019: Pt plan to hold  . ondansetron (ZOFRAN) 4 MG tablet Take 1 tablet (4 mg total) by mouth every 8 (eight) hours  as needed for nausea or vomiting. (Patient not taking: Reported on 06/05/2019)   . OneTouch Delica Lancets 62X MISC Use as directed to check blood sugars daily dx: e11.22   . pravastatin (PRAVACHOL) 40 MG tablet TAKE 1 TABLET BY MOUTH EVERY DAY (Patient taking differently: Take 40 mg by mouth daily. )   . sennosides-docusate sodium (SENOKOT-S) 8.6-50 MG tablet Take 2 tablets by mouth daily. (Patient not taking: Reported on 06/05/2019)   . SYNTHROID 75 MCG tablet TAKE 1 TABLET DAILY MON-SAT, HOLD ON SUNDAY. (Patient taking differently: Take 75 mcg by mouth See admin instructions. Take 75 mcg by mouth daily Monday - Saturday and hold on Sunday)   . topiramate (TOPAMAX) 50  MG tablet Take 100 mg by mouth 2 (two) times daily.     No facility-administered encounter medications on file as of 06/11/2019.     ONCOLOGIC FAMILY HISTORY:  Family History  Problem Relation Age of Onset  . Hypertension Mother   . Kidney failure Mother   . Congestive Heart Failure Mother   . Heart Problems Father   . Stroke Father   . Pneumonia Father   . Heart Problems Sister 40       heart transplant  . Stroke Paternal Grandmother   . Dementia Paternal Grandmother   . Stroke Paternal Grandfather   . Cardiomyopathy Sister   . Ovarian cancer Sister 47       d. 66  . Thyroid disease Sister   . Thyroid disease Sister   . Thyroid cancer Niece 60  . Breast cancer Neg Hx      GENETIC COUNSELING/TESTING: See above  SOCIAL HISTORY:  Social History   Socioeconomic History  . Marital status: Married    Spouse name: [name]  . Number of children: 4  . Years of education: BS  . Highest education level: Not on file  Occupational History  . Occupation: Retired    Fish farm manager: OTHER  Tobacco Use  . Smoking status: Never Smoker  . Smokeless tobacco: Never Used  Substance and Sexual Activity  . Alcohol use: No  . Drug use: No  . Sexual activity: Not Currently  Other Topics Concern  . Not on file  Social History Narrative   Patient lives at home with family.   Caffeine Use: occasionally      Lives at home with husband and son.  Is a retired Psychologist, prison and probation services Strain:   . Difficulty of Paying Living Expenses: Not on file  Food Insecurity:   . Worried About Charity fundraiser in the Last Year: Not on file  . Ran Out of Food in the Last Year: Not on file  Transportation Needs:   . Lack of Transportation (Medical): Not on file  . Lack of Transportation (Non-Medical): Not on file  Physical Activity:   . Days of Exercise per Week: Not on file  . Minutes of Exercise per Session: Not on file  Stress:   . Feeling of Stress : Not on  file  Social Connections:   . Frequency of Communication with Friends and Family: Not on file  . Frequency of Social Gatherings with Friends and Family: Not on file  . Attends Religious Services: Not on file  . Active Member of Clubs or Organizations: Not on file  . Attends Archivist Meetings: Not on file  . Marital Status: Not on file  Intimate Partner Violence:   . Fear of Current  or Ex-Partner: Not on file  . Emotionally Abused: Not on file  . Physically Abused: Not on file  . Sexually Abused: Not on file     OBSERVATIONS/OBJECTIVE:  Patient sounds well. In no apparent distress.  Breathing is non labored, mood and behavior is normal.   LABORATORY DATA:  None for this visit.  DIAGNOSTIC IMAGING:  None for this visit.      ASSESSMENT AND PLAN:  Christina Hernandez is a pleasant 68 y.o. female with Stage 0 right breast DCIS, ER+/PR+, diagnosed in 06/2018, treated with lumpectomy, adjuvant radiation therapy, and anti-estrogen therapy with Anastrozole beginning in 05/2019.  She presents to the Survivorship Clinic for our initial meeting and routine follow-up post-completion of treatment for breast cancer.    1. Stage 0 right breast cancer:  Christina Hernandez is continuing to recover from definitive treatment for breast cancer. She will follow-up with her medical oncologist, Dr. Jana Hakim in 07/2019 with history and physical exam per surveillance protocol.  She will continue her anti-estrogen therapy with Anastrozole. Thus far, she is tolerating the Anastrozole well, with minimal side effects. She was instructed to make Dr. Jana Hakim or myself aware if she begins to experience any worsening side effects of the medication and I could see her back in clinic to help manage those side effects, as needed. Her mammogram is due 06/2019; orders placed today.   Today, a comprehensive survivorship care plan and treatment summary was reviewed with the patient today detailing her breast cancer diagnosis,  treatment course, potential late/long-term effects of treatment, appropriate follow-up care with recommendations for the future, and patient education resources.  A copy of this summary, along with a letter will be sent to the patient's primary care provider via mail/fax/In Basket message after today's visit.    2. Bone health:  Given Christina Hernandez's age/history of breast cancer and her current treatment regimen including anti-estrogen therapy with Anastrozole, she is at risk for bone demineralization. She believes her bone density was 1-2 years ago and her PCP follows this.  I have asked my nurse to call over to Dr. Baird Cancer office to get her most recent bone density.   3. Cancer screening:  Due to Christina Hernandez's history and her age, she should receive screening for skin cancers, colon cancer.  The information and recommendations are listed on the patient's comprehensive care plan/treatment summary and were reviewed in detail with the patient.    4. Health maintenance and wellness promotion: Christina Hernandez was encouraged to consume 5-7 servings of fruits and vegetables per day. We reviewed the "Nutrition Rainbow" handout, as well as the handout "Take Control of Your Health and Reduce Your Cancer Risk" from the [place].  She was also encouraged to engage in moderate to vigorous exercise for 30 minutes per day most days of the week. We discussed the [employer] fitness program, which is designed for cancer survivors to help them become more physically fit after cancer treatments.  She was instructed to limit her alcohol consumption and continue to abstain from tobacco use/.     5. Support services/counseling: It is not uncommon for this period of the patient's cancer care trajectory to be one of many emotions and stressors.  We discussed how this can be increasingly difficult during the times of quarantine and social distancing due to the COVID-19 pandemic.   She was given information regarding  our available services and encouraged to contact me with any questions or for help enrolling in any of our  support group/programs.    Follow up instructions:    -Return to cancer center in 2.2021 for f/u with Dr. Jana Hakim  -Mammogram due in 06/2019 -Follow up with surgery 01/2020 -She is welcome to return back to the Survivorship Clinic at any time; no additional follow-up needed at this time.  -Consider referral back to survivorship as a long-term survivor for continued surveillance  The patient was provided an opportunity to ask questions and all were answered. The patient agreed with the plan and demonstrated an understanding of the instructions.   The patient was advised to call back or seek an in-person evaluation if the symptoms worsen or if the condition fails to improve as anticipated.   I provided 18 minutes of non face-to-face telephone visit time during this encounter, and > 50% was spent counseling as documented under my assessment & plan.  Scot Dock, NP

## 2019-06-14 ENCOUNTER — Other Ambulatory Visit: Payer: Self-pay | Admitting: Internal Medicine

## 2019-06-14 DIAGNOSIS — E1165 Type 2 diabetes mellitus with hyperglycemia: Secondary | ICD-10-CM

## 2019-06-30 ENCOUNTER — Other Ambulatory Visit: Payer: Self-pay | Admitting: Internal Medicine

## 2019-07-05 IMAGING — MG MM PLC BREAST LOC DEV 1ST LESION INC*R*
8 series · 8 of 8 positions shown · non-contrast
Comparison: 07/24/2018

CLINICAL DATA: Patient presents for radioactive seed localization
prior to lumpectomy for DCIS in the RIGHT breast.

EXAM:
MAMMOGRAPHIC GUIDED RADIOACTIVE SEED LOCALIZATION OF THE RIGHT
BREAST

[R ML (1 of 4)]
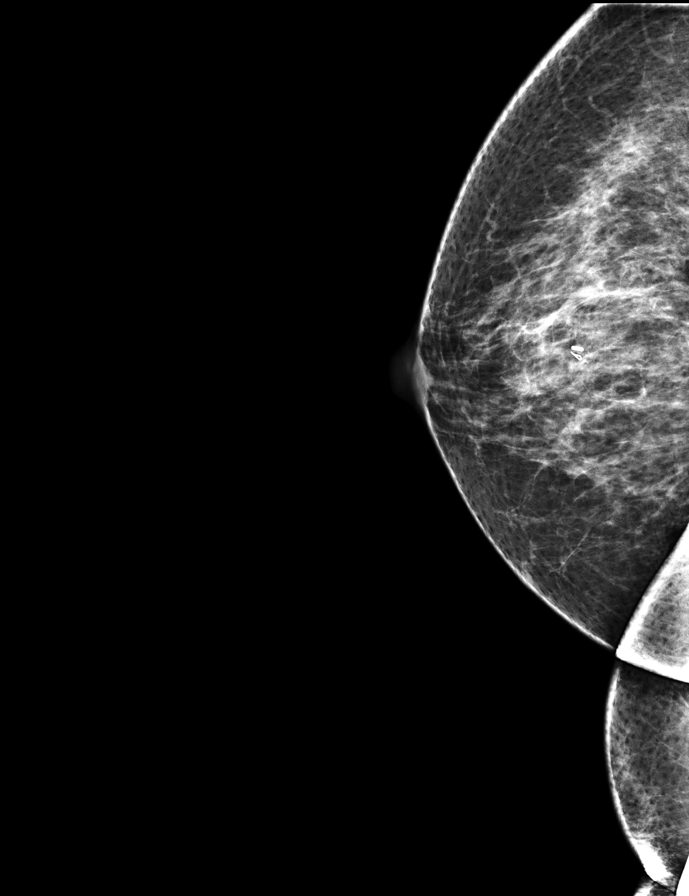

[R CC (1 of 4)]
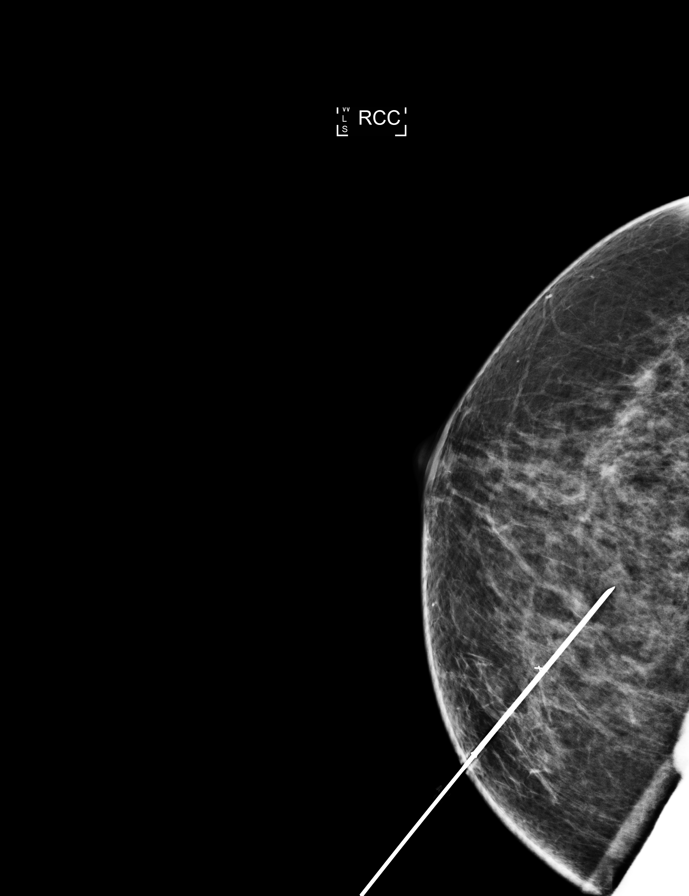

[R ML (2 of 4)]
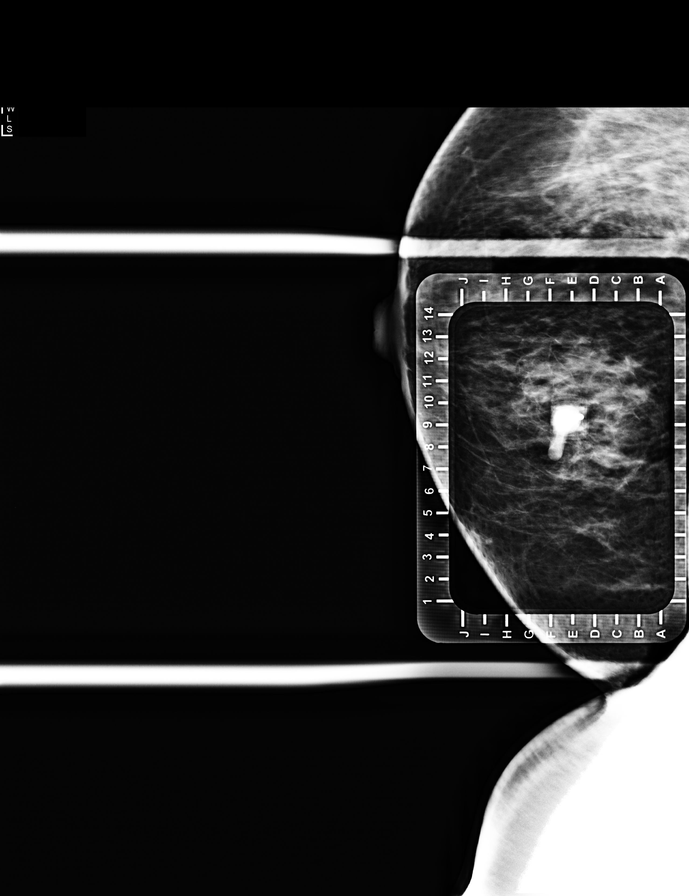

[R CC (2 of 4)]
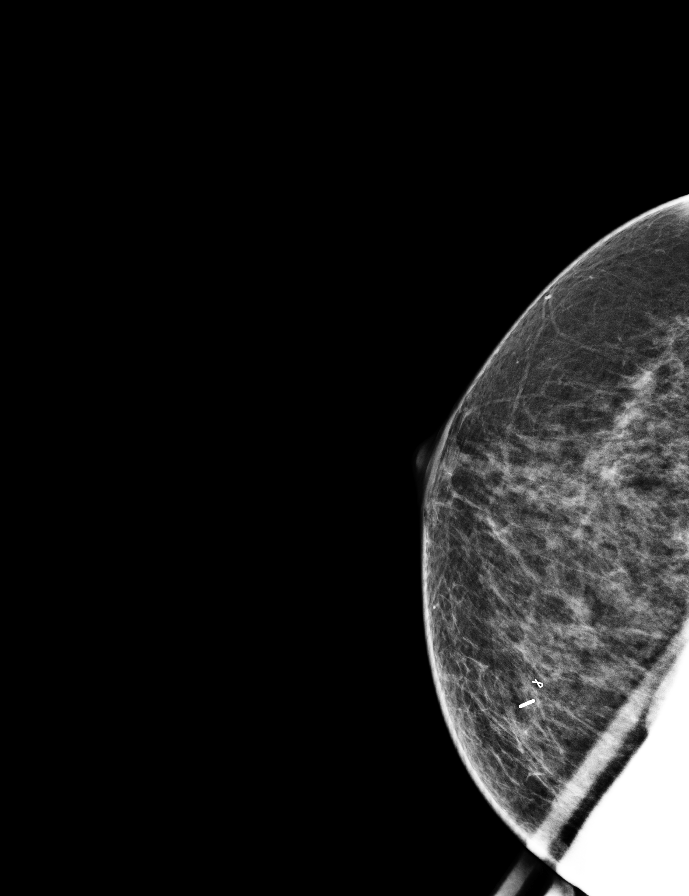

[R CC (3 of 4)]
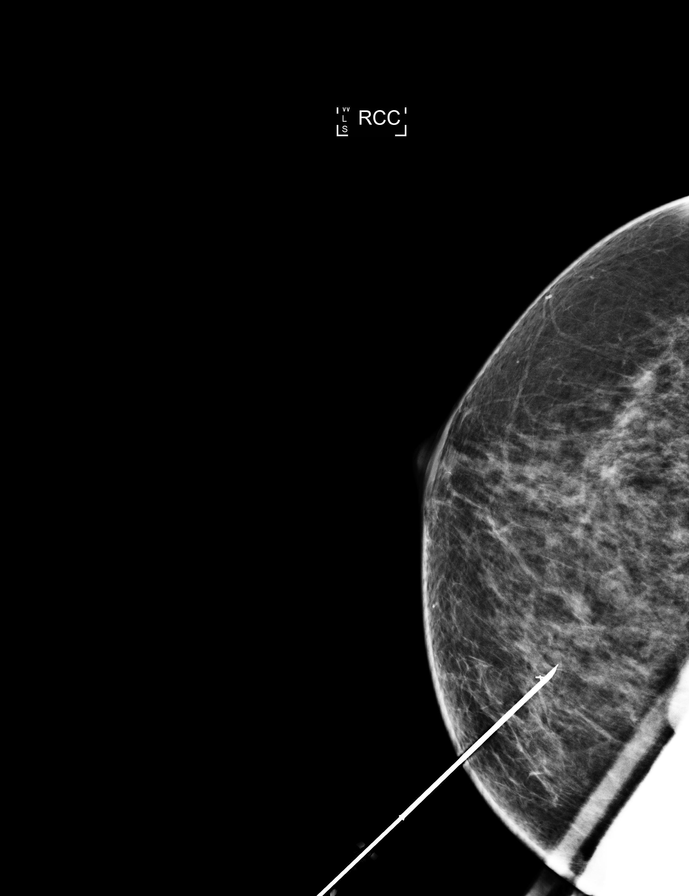

[R ML (3 of 4)]
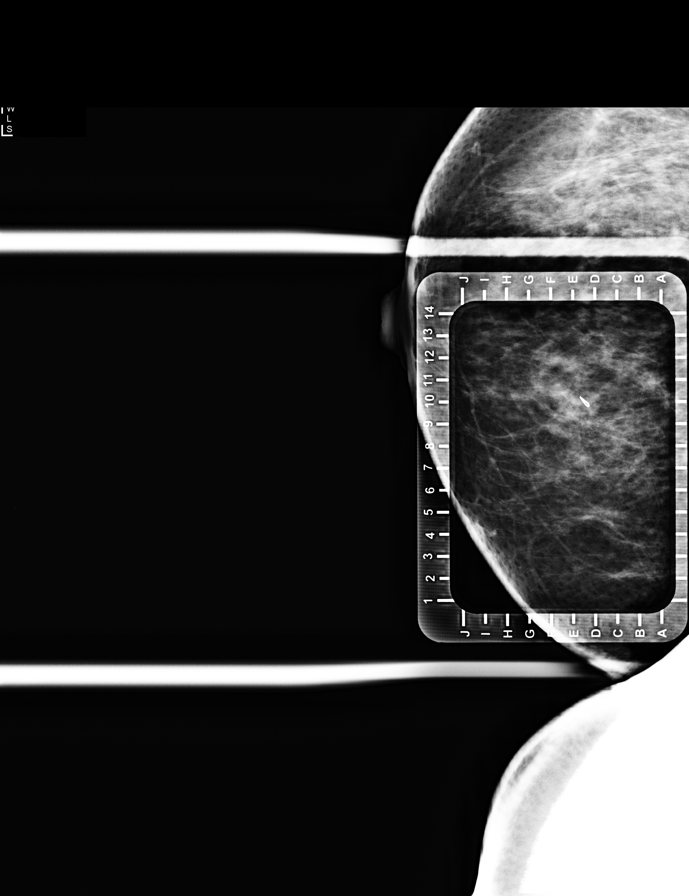

[R ML (4 of 4)]
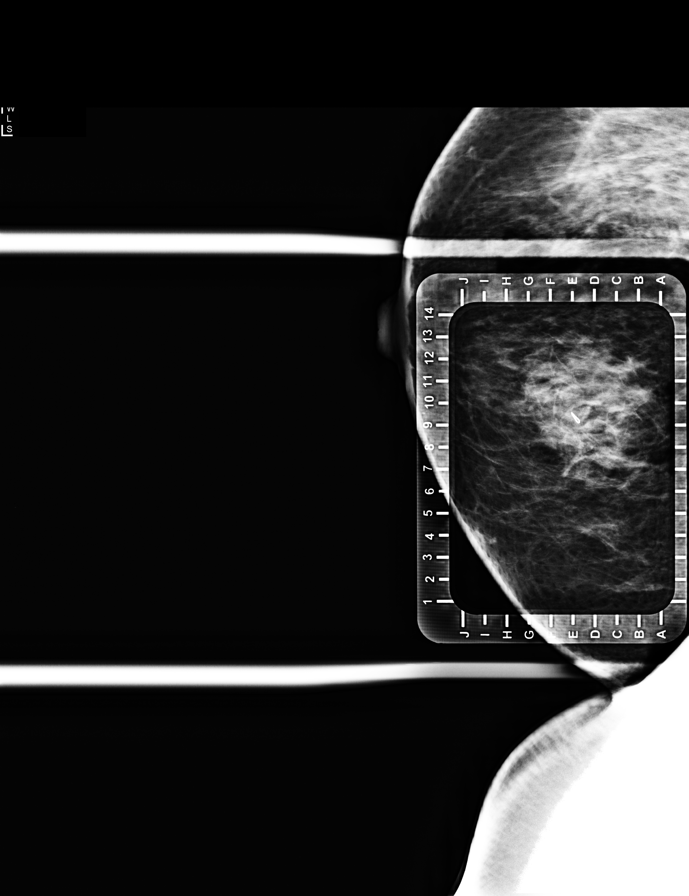

[R CC (4 of 4)]
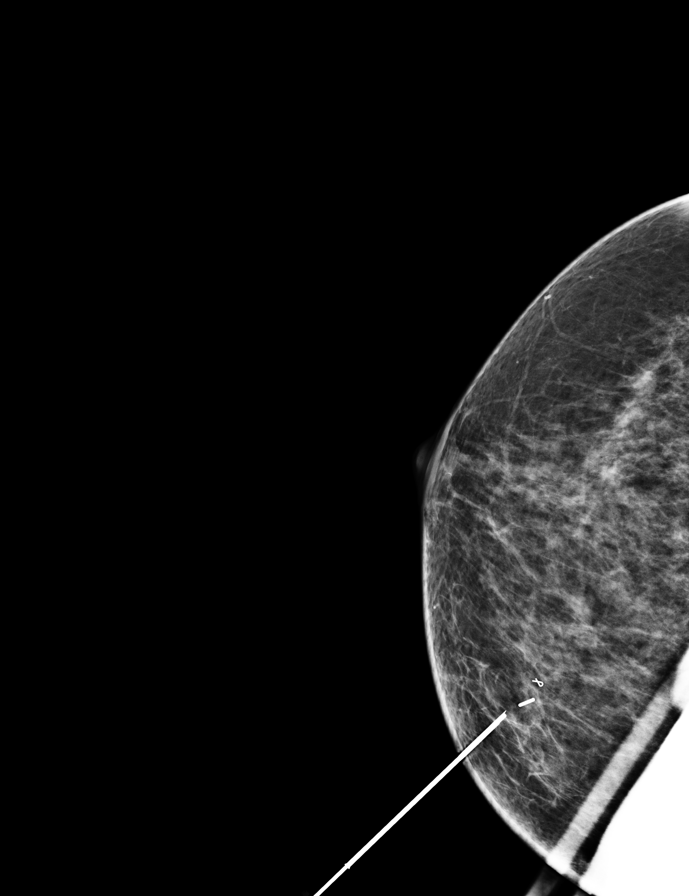

[8 of 8 positions shown; findings below may reference images not displayed]

FINDINGS: Patient presents for radioactive seed localization prior to
lumpectomy. I met with the patient and we discussed the procedure of
seed localization including benefits and alternatives. We discussed
the high likelihood of a successful procedure. We discussed the
risks of the procedure including infection, bleeding, tissue injury
and further surgery. We discussed the low dose of radioactivity
involved in the procedure. Informed, written consent was given.

The usual time-out protocol was performed immediately prior to the
procedure.

Using mammographic guidance, sterile technique, 1% lidocaine and an
[id number] radioactive seed, the ribbon shaped clip in the MEDIAL portion
of the RIGHT breast was localized using a MEDIAL to LATERAL
approach. The follow-up mammogram images confirm the seed in the
expected location and were marked for Dr. Eufemio.

Follow-up survey of the patient confirms presence of the radioactive
seed.

Order number of [id number] seed:  [id number].

Total activity:  0.247 millicuries reference Date: 08/14/2018

The patient tolerated the procedure well and was released from the
[REDACTED]. She was given instructions regarding seed removal.
IMPRESSION: Radioactive seed localization right breast. No apparent
complications.

## 2019-07-07 IMAGING — DX BREAST SURGICAL SPECIMEN
1 series · 2 of 2 positions shown · non-contrast
Comparison: Previous exam(s).

CLINICAL DATA: Status post RIGHT breast lumpectomy for DCIS after
radioactive seed localization.

EXAM:
SPECIMEN RADIOGRAPH OF THE RIGHT BREAST

[Series 2: specimen digital x-ray, derived · right · 2 of 2 slices shown]
[im 1/2]
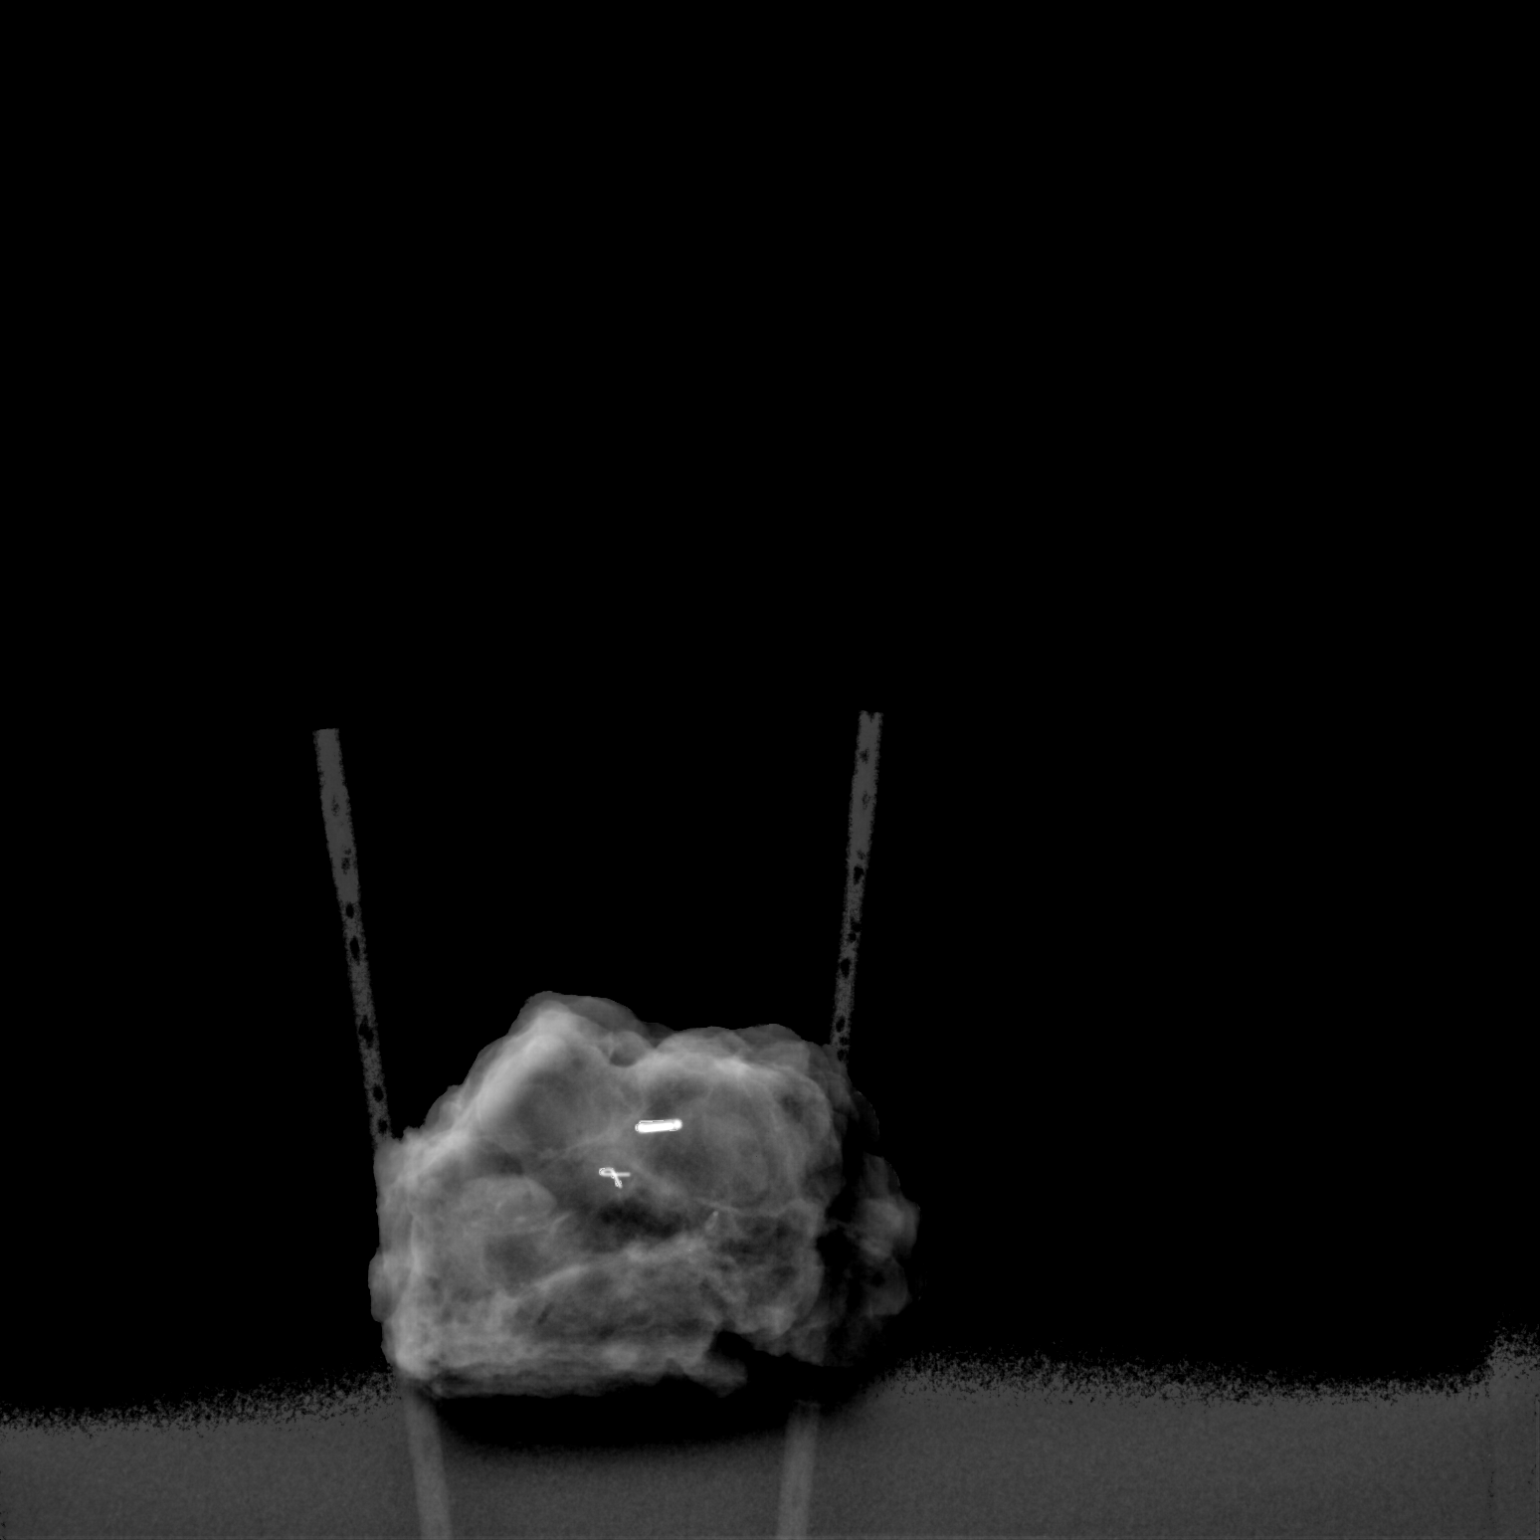
[im 2/2]
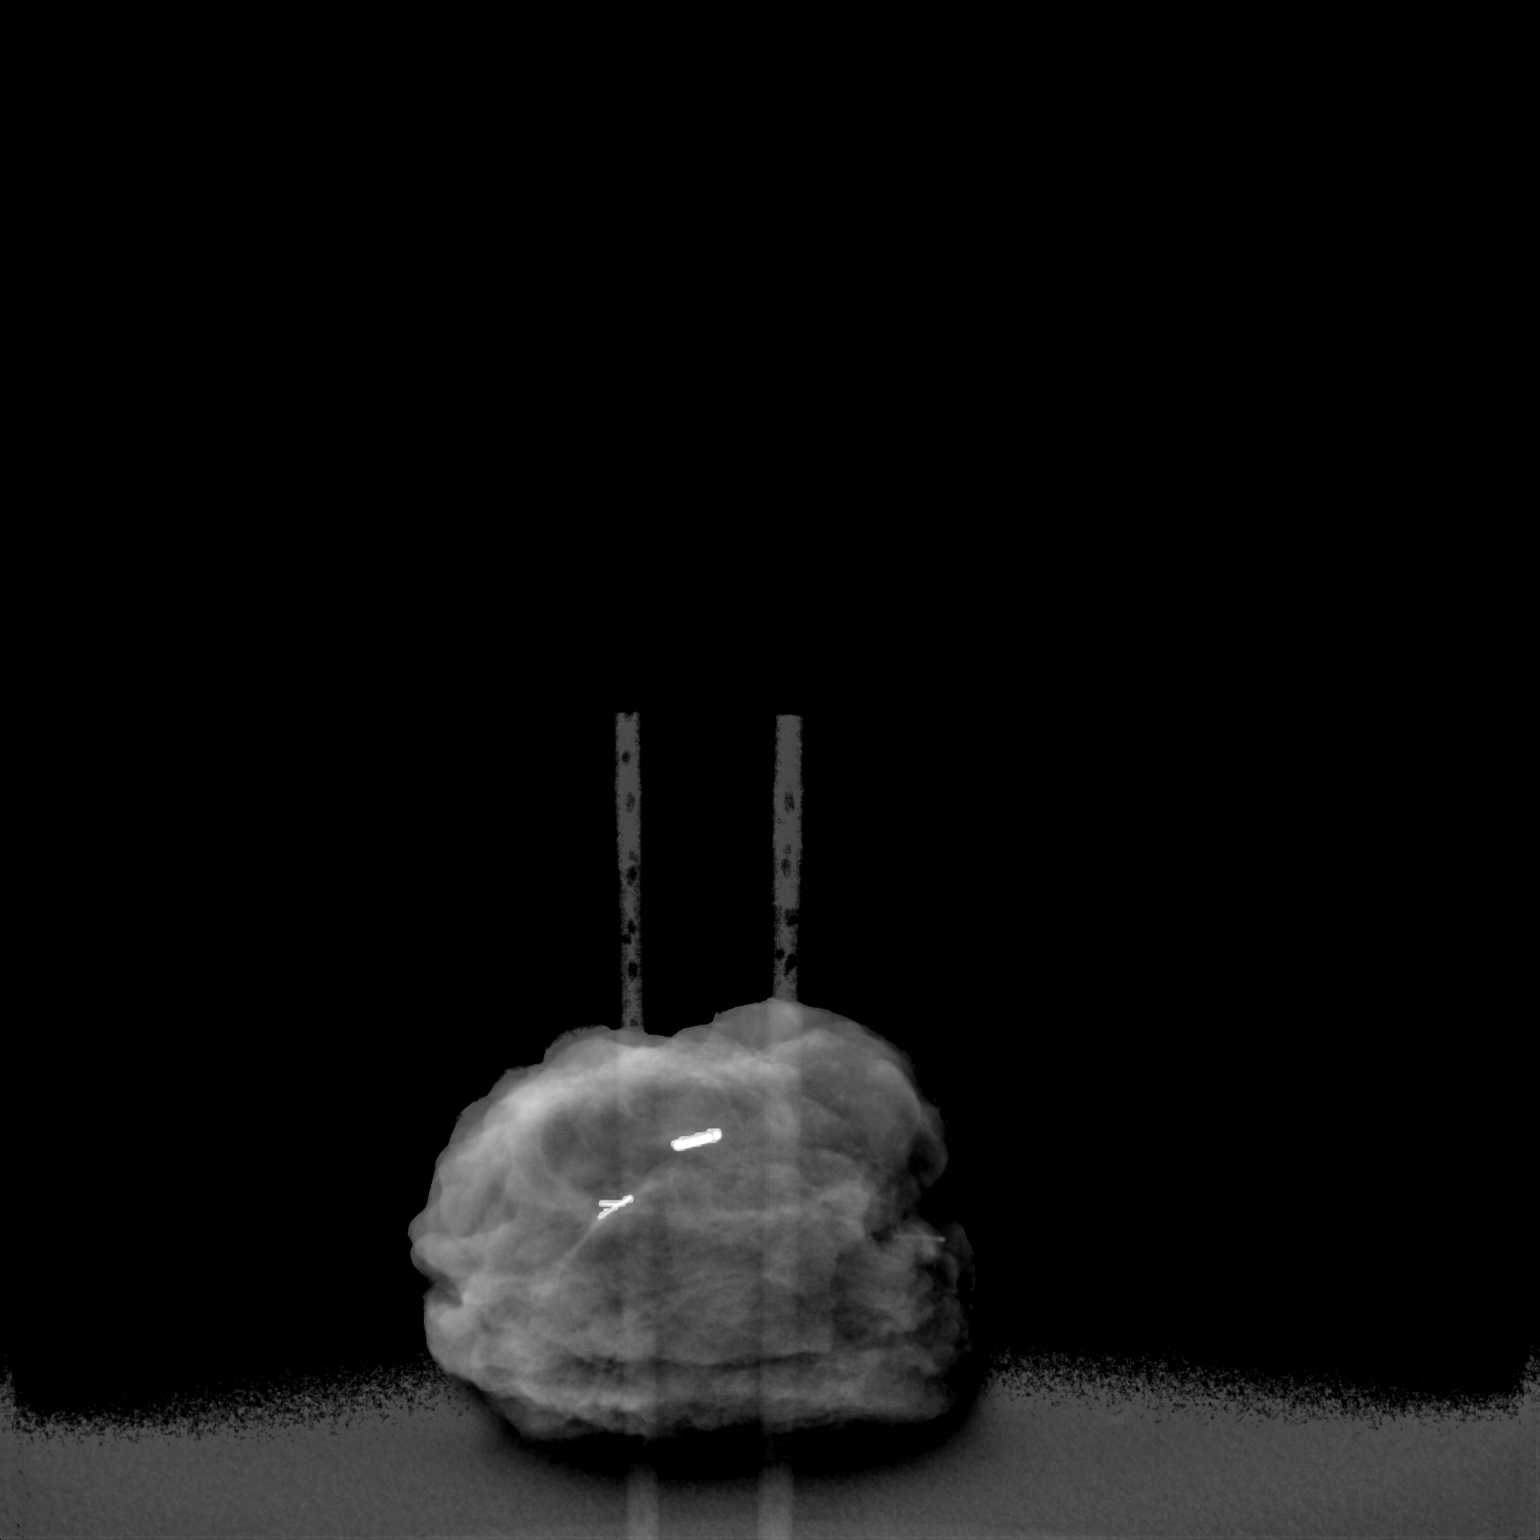

[2 of 2 positions shown; findings below may reference images not displayed]

FINDINGS: Status post excision of the right breast. The radioactive seed and
biopsy marker clip are present, completely intact, and were marked
for pathology.
IMPRESSION: Specimen radiograph of the right breast.

## 2019-07-09 ENCOUNTER — Ambulatory Visit: Payer: Medicare Other | Admitting: Diagnostic Neuroimaging

## 2019-07-13 ENCOUNTER — Ambulatory Visit
Admission: RE | Admit: 2019-07-13 | Discharge: 2019-07-13 | Disposition: A | Discharge status: Home / Self Care | Payer: Medicare PPO | Source: Ambulatory Visit | Attending: Oncology | Admitting: Oncology

## 2019-07-13 ENCOUNTER — Other Ambulatory Visit: Payer: Self-pay

## 2019-07-13 DIAGNOSIS — R922 Inconclusive mammogram: Secondary | ICD-10-CM | POA: Diagnosis not present

## 2019-07-13 DIAGNOSIS — D0511 Intraductal carcinoma in situ of right breast: Secondary | ICD-10-CM

## 2019-07-13 HISTORY — DX: Personal history of irradiation: Z92.3

## 2019-07-14 ENCOUNTER — Other Ambulatory Visit: Payer: Self-pay | Admitting: Diagnostic Neuroimaging

## 2019-08-01 NOTE — Progress Notes (Signed)
[name]  [phone] [phone]     ID: Christina Hernandez DOB: 1951-02-04  MR#: [id number]  GBT#:[id number]  Patient Care Team: Glendale Chard, MD as PCP - General (Internal Medicine) [name], MD as PCP - Cardiology (Cardiology) [name], MD as Consulting Physician (Obstetrics and Gynecology) [name], MD as Consulting Physician (General Surgery) Pura Picinich, [name], MD as Consulting Physician (Oncology) [name], MD as Consulting Physician (Radiation Oncology) [name], Earlean Polka, MD as Consulting Physician (Neurology) [name], MD as Consulting Physician (Pulmonary Disease) [name], MD as Consulting Physician (Orthopedic Surgery) Chauncey Cruel, MD OTHER MD:   CHIEF COMPLAINT: Estrogen receptor positive breast cancer  CURRENT TREATMENT: Anastrozole   INTERVAL HISTORY: [name] did not show for her scheduled visit 08/02/2019  She started on anastrozole on 04/29/2019.  Since her last visit, she underwent bilateral diagnostic mammography with tomography at [facility] on 07/13/2019 showing: breast density category C; no evidence of malignancy in either breast.  She also underwent partial knee replacement on 04/17/2019 under Dr. [name]   REVIEW OF SYSTEMS: [name]    HISTORY OF CURRENT ILLNESS: From the original intake note:  Christina Hernandez had routine screening mammography on 07/10/2018 showing a possible abnormality in the right breast. She underwent bilateral diagnostic mammography with tomography and right breast ultrasonography at [facility] on 07/21/2018 showing: indeterminate mass at the 3:30 position of the right breast measuring 3 x 4 x 4 mm; normal right axilla.  Accordingly on 07/24/2018 she proceeded to biopsy of the right breast area in question. The pathology ([id number]) from this procedure showed: ductal carcinoma in situ, low grade, may be involving an  underlying papillary lesion. Prognostic indicators significant for: estrogen receptor, 100% positive and progesterone receptor, 100% positive or negative, both with strong staining intensity.   The patient's subsequent history is as detailed below.   PAST MEDICAL HISTORY: Past Medical History:  Diagnosis Date  . Arthritis   . Breast cancer, right breast [facility])    pending right breast lumpectomy on September 01, 2018  . Diabetes mellitus type II, controlled ([place])    On Kombiglyze  . Family history of ovarian cancer   . Family history of thyroid cancer   . GERD (gastroesophageal reflux disease)    rarely occurs- no meds  . Hyperlipidemia associated with type 2 diabetes mellitus ([place])   . Hypertension   . Hypothyroidism   . Personal history of radiation therapy   . Primary localized osteoarthritis of right knee 04/17/2019  . Trigeminal neuralgia of left side of face 2015   s/p Gamma knife  She has a stable lung nodule   PAST SURGICAL HISTORY: Past Surgical History:  Procedure Laterality Date  . BREAST BIOPSY Right 07/24/2018  . BREAST LUMPECTOMY Right 09/01/2018  . BREAST LUMPECTOMY WITH RADIOACTIVE SEED LOCALIZATION Right 09/01/2018   Procedure: RIGHT BREAST LUMPECTOMY WITH RADIOACTIVE SEED LOCALIZATION;  Surgeon: [name], MD;  Location: [facility];  Service: General;  Laterality: Right;  . COLONOSCOPY    . Gamma Knife Left 04/08/2016  . HYSTEROSCOPY WITH D & C  05/24/2011   Procedure: DILATATION AND CURETTAGE (D&C) /HYSTEROSCOPY;  Surgeon: [name], MD;  Location: [facility];  Service: Gynecology;  Laterality: N/A;  . KNEE SURGERY Right 06/10/15   torn ligament  . PARTIAL KNEE ARTHROPLASTY Right 04/17/2019   Procedure: UNICOMPARTMENTAL KNEE;  Surgeon: [name], MD;  Location: [facility];  Service: Orthopedics;  Laterality: Right;  .  THYROIDECTOMY N/A 09/08/2016   Procedure: TOTAL THYROIDECTOMY;  Surgeon: [name], MD;  Location: [facility];  Service: ENT;   Laterality: N/A;  . TUBAL LIGATION    . uterine polyp    . WISDOM TOOTH EXTRACTION Left 2014    FAMILY HISTORY Family History  Problem Relation Age of Onset  . Hypertension Mother   . Kidney failure Mother   . Congestive Heart Failure Mother   . Heart Problems Father   . Stroke Father   . Pneumonia Father   . Heart Problems Sister 40       heart transplant  . Stroke Paternal Grandmother   . Dementia Paternal Grandmother   . Stroke Paternal Grandfather   . Cardiomyopathy Sister   . Ovarian cancer Sister 63       d. 42  . Thyroid disease Sister   . Thyroid disease Sister   . Thyroid cancer Niece 91  . Breast cancer Neg Hx   (as of 08/02/2018) Patient father was 41 years old when he died from pneumonia and a stroke. Patient mother died from possible heart attack at age 32.  The patient denies a family hx of breast cancer. A sister was diagnosed with ovarian cancer, and has since passed away at age 67.  The patient has 5 siblings, 5 sisters and 0 brothers.   GYNECOLOGIC HISTORY:  No LMP recorded. Patient is postmenopausal. Menarche: 69 years old Age at first live birth: 69 years old [place] P 4 LMP 1996 Contraceptive yes HRT no  Hysterectomy? Yes, 5-6 years ago (per patient) BSO? 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 Husband [name] is a retired English as a second language teacher. [name], age 40, is a NP for an opioid clinic in [place]. Son [name], age 19, is a Nature conservation officer in [place]. Daughter [name], age 51, works as a Software engineer for [employer]. Son [name], age 35, works in Land in [place].  The patient has no grandchildren.  She belongs to [employer]: Husband [name] is her HCPOA.   HEALTH MAINTENANCE: Social History   Tobacco Use  . Smoking status: Never Smoker  . Smokeless tobacco: Never Used  Substance Use Topics  . Alcohol use: No  . Drug use: No     Colonoscopy: 2019, Dr. Collene Mares  PAP:  03/21/2011, normal  Bone density: unknown   Allergies  Allergen Reactions  . Cyclobenzaprine Nausea Only    Current Outpatient Medications  Medication Sig Dispense Refill  . anastrozole (ARIMIDEX) 1 MG tablet Take 1 tablet (1 mg total) by mouth daily. Start Apr 29, 2019 90 tablet 4  . aspirin EC 325 MG tablet Take 1 tablet (325 mg total) by mouth 2 (two) times daily. (Patient not taking: Reported on 06/05/2019) 60 tablet 0  . baclofen (LIORESAL) 10 MG tablet Take 10 mg by mouth 3 (three) times daily.     . Biotin (BIOTIN MAXIMUM) 10000 MCG TBDP Take 10,000 mcg by mouth daily.     . cetirizine (ZYRTEC) 10 MG tablet Take 1 tablet (10 mg total) by mouth daily. (Patient not taking: Reported on 04/06/2019) 30 tablet 3  . Cholecalciferol (VITAMIN D) 125 MCG (5000 UT) CAPS Take 5,000 Units by mouth daily.    . COMBIGAN 0.2-0.5 % ophthalmic solution Place 1 drop into both eyes 2 (two) times daily.    . DULoxetine (CYMBALTA) 60 MG capsule Take 1 capsule (60 mg total) by mouth daily. 90 capsule  4  . gabapentin (NEURONTIN) 300 MG capsule Take 3 capsules (900 mg total) by mouth 3 (three) times daily. 810 capsule 4  . glucose blood (ONETOUCH VERIO) test strip Use as directed to check blood sugars daily dx: e11.22 150 each 2  . HYDROcodone-acetaminophen (NORCO) 10-325 MG tablet Take 1 tablet by mouth every 6 (six) hours as needed. 28 tablet 0  . KOMBIGLYZE XR 5-500 MG TB24 TAKE 1 TABLET BY MOUTH DAILY BEFORE SUPPER. 90 tablet 1  . latanoprost (XALATAN) 0.005 % ophthalmic solution Place 1 drop into both eyes at bedtime.    Marland Kitchen losartan-hydrochlorothiazide (HYZAAR) 50-12.5 MG tablet TAKE 1 TABLET BY MOUTH EVERY DAY (Patient taking differently: Take 1 tablet by mouth daily. ) 90 tablet 1  . meloxicam (MOBIC) 15 MG tablet Take 15 mg by mouth daily.    . ondansetron (ZOFRAN) 4 MG tablet Take 1 tablet (4 mg total) by mouth every 8 (eight) hours as needed for nausea or vomiting. (Patient not taking: Reported on  06/05/2019) 10 tablet 0  . OneTouch Delica Lancets 22L MISC Use as directed to check blood sugars daily dx: e11.22 150 each 2  . pravastatin (PRAVACHOL) 40 MG tablet TAKE 1 TABLET BY MOUTH EVERY DAY (Patient taking differently: Take 40 mg by mouth daily. ) 90 tablet 2  . sennosides-docusate sodium (SENOKOT-S) 8.6-50 MG tablet Take 2 tablets by mouth daily. (Patient not taking: Reported on 06/05/2019) 30 tablet 1  . SYNTHROID 75 MCG tablet Take 1 tablet (75 mcg total) by mouth See admin instructions. Take 75 mcg by mouth daily Monday - Saturday and hold on Sunday 90 tablet 1  . topiramate (TOPAMAX) 50 MG tablet Take 100 mg by mouth 2 (two) times daily.      No current facility-administered medications for this visit.    OBJECTIVE: Middle-aged African-American woman   There were no vitals filed for this visit. Wt Readings from Last 3 Encounters:  06/05/19 172 lb 12.8 oz (78.4 kg)  04/17/19 173 lb 6.4 oz (78.7 kg)  04/10/19 173 lb 4 oz (78.6 kg)   There is no height or weight on file to calculate BMI.      LAB RESULTS:  CMP     Component Value Date/Time   NA 143 06/05/2019 1524   K 4.3 06/05/2019 1524   CL 104 06/05/2019 1524   CO2 25 06/05/2019 1524   GLUCOSE 81 06/05/2019 1524   GLUCOSE 138 (H) 04/18/2019 0300   BUN 14 06/05/2019 1524   CREATININE 0.78 06/05/2019 1524   CREATININE 0.77 08/02/2018 0835   CALCIUM 9.6 06/05/2019 1524   PROT 6.9 06/05/2019 1524   ALBUMIN 4.4 06/05/2019 1524   AST 21 06/05/2019 1524   AST 18 08/02/2018 0835   ALT 18 06/05/2019 1524   ALT 22 08/02/2018 0835   ALKPHOS 107 06/05/2019 1524   BILITOT 0.5 06/05/2019 1524   BILITOT 0.7 08/02/2018 0835   GFRNONAA 77 06/05/2019 1524   GFRNONAA >60 08/02/2018 0835   GFRAA 89 06/05/2019 1524   GFRAA >60 08/02/2018 0835    No results found for: TOTALPROTELP, ALBUMINELP, A1GS, A2GS, BETS, BETA2SER, GAMS, MSPIKE, SPEI  No results found for: KPAFRELGTCHN, LAMBDASER, KAPLAMBRATIO  Lab Results    Component Value Date   WBC 8.2 04/18/2019   NEUTROABS 2.8 08/02/2018   HGB 10.7 (L) 04/18/2019   HCT 34.3 (L) 04/18/2019   MCV 87.9 04/18/2019   PLT 218 04/18/2019   No results found for: LABCA2  No components found  for: [id number]  No results for input(s): INR in the last 168 hours.  No results found for: LABCA2  No results found for: GUY403  No results found for: KVQ259  No results found for: DGL875  No results found for: CA2729  No components found for: HGQUANT  No results found for: CEA1 / No results found for: CEA1   No results found for: AFPTUMOR  No results found for: CHROMOGRNA  No results found for: HGBA, HGBA2QUANT, HGBFQUANT, HGBSQUAN (Hemoglobinopathy evaluation)   No results found for: LDH  No results found for: IRON, TIBC, IRONPCTSAT (Iron and TIBC)  No results found for: FERRITIN  Urinalysis    Component Value Date/Time   COLORURINE YELLOW 08/17/2007 1404   APPEARANCEUR CLEAR 08/17/2007 1404   LABSPEC 1.010 08/17/2007 1404   PHURINE 7.0 08/17/2007 1404   GLUCOSEU NEGATIVE 08/17/2007 1404   HGBUR NEGATIVE 08/17/2007 1404   BILIRUBINUR negative 03/08/2019 1234   KETONESUR negative 04/12/2016 1147   KETONESUR NEGATIVE 08/17/2007 1404   PROTEINUR Negative 03/08/2019 1234   PROTEINUR NEGATIVE 08/17/2007 1404   UROBILINOGEN 0.2 03/08/2019 1234   UROBILINOGEN 0.2 08/17/2007 1404   NITRITE negative 03/08/2019 1234   NITRITE NEGATIVE 08/17/2007 1404   LEUKOCYTESUR Negative 03/08/2019 1234     STUDIES: MM DIAG BREAST TOMO BILATERAL  Result Date: 07/13/2019 CLINICAL DATA:  Patient status post right breast lumpectomy 2020. EXAM: DIGITAL DIAGNOSTIC BILATERAL MAMMOGRAM WITH CAD AND TOMO COMPARISON:  Previous exam(s). ACR Breast Density Category c: The breast tissue is heterogeneously dense, which may obscure small masses. FINDINGS: Interval postlumpectomy changes right breast. No concerning masses, calcifications or nonsurgical distortion  identified within either breast. Mammographic images were processed with CAD. IMPRESSION: No mammographic evidence for malignancy. Interval postlumpectomy changes right breast. RECOMMENDATION: Bilateral diagnostic mammography 1 year. I have discussed the findings and recommendations with the patient. If applicable, a reminder letter will be sent to the patient regarding the next appointment. BI-RADS CATEGORY  2: Benign. Electronically Signed   By: Lovey Newcomer M.D.   On: 07/13/2019 11:09    ELIGIBLE FOR AVAILABLE RESEARCH PROTOCOL: no  ASSESSMENT: 69 y.o. [place] woman status post right breast biopsy 07/24/2018 for ductal carcinoma in situ, grade 1, strongly estrogen and progesterone receptor positive.  (1) status post right lumpectomy 09/01/2018 for ductal carcinoma in situ, 5 mm, with negative margins  (2) adjuvant radiation 10/16/2018 - 11/10/2018  (a) Right Breast was treated to 42.56 Gy in 16 fractions, followed by a boost of 8 Gy in 4 fractions.  (3) to start anastrozole 04/29/2019  (4) genetics testing on 08/21/2018  (a) genetic testing 08/27/2018 on the STAT panel and the common hereditary cancer gene paneloffered by [facility] found no deleterious mutations in APC, ATM, AXIN2, BARD1, BMPR1A, BRCA1, BRCA2, BRIP1, CDH1, CDK4, CDKN2A (p14ARF), CDKN2A (p16INK4a), CHEK2, CTNNA1, DICER1, EPCAM (Deletion/duplication testing only), GREM1 (promoter region deletion/duplication testing only), KIT, MEN1, MLH1, MSH2, MSH3, MSH6, MUTYH, NBN, NF1, NHTL1, PALB2, PDGFRA, PMS2, POLD1, POLE, PTEN, RAD50, RAD51C, RAD51D, SDHB, SDHC, SDHD, SMAD4, SMARCA4. STK11, TP53, TSC1, TSC2, and VHL.  The following genes were evaluated for sequence changes only: SDHA and HOXB13 c.251G>A variant only.     PLAN: Patient did not show for her 08/02/2019 visit  Finian Helvey, [name], MD  08/02/19 2:02 PM Medical Oncology and Hematology [facility] [place], [place] [address] Tel. [phone]     Fax. [phone]   I, Wilburn Mylar, am acting as scribe for Dr. [name]. Kollen Armenti.  I,  Lurline Del MD, have reviewed the above documentation for accuracy and completeness, and I agree with the above.    *Total Encounter Time as defined by the Centers for Medicare and Medicaid Services includes, in addition to the face-to-face time of a patient visit (documented in the note above) non-face-to-face time: obtaining and reviewing outside history, ordering and reviewing medications, tests or procedures, care coordination (communications with other health care professionals or caregivers) and documentation in the medical record.

## 2019-08-02 ENCOUNTER — Encounter: Payer: Self-pay | Admitting: Oncology

## 2019-08-02 ENCOUNTER — Inpatient Hospital Stay: Payer: Medicare PPO | Attending: Oncology | Admitting: Oncology

## 2019-08-02 DIAGNOSIS — I10 Essential (primary) hypertension: Secondary | ICD-10-CM

## 2019-08-02 DIAGNOSIS — G500 Trigeminal neuralgia: Secondary | ICD-10-CM

## 2019-08-02 DIAGNOSIS — M1711 Unilateral primary osteoarthritis, right knee: Secondary | ICD-10-CM

## 2019-08-02 DIAGNOSIS — E7800 Pure hypercholesterolemia, unspecified: Secondary | ICD-10-CM

## 2019-08-02 DIAGNOSIS — E039 Hypothyroidism, unspecified: Secondary | ICD-10-CM

## 2019-08-02 DIAGNOSIS — D0511 Intraductal carcinoma in situ of right breast: Secondary | ICD-10-CM

## 2019-08-02 DIAGNOSIS — E1165 Type 2 diabetes mellitus with hyperglycemia: Secondary | ICD-10-CM

## 2019-08-22 DIAGNOSIS — Z96651 Presence of right artificial knee joint: Secondary | ICD-10-CM | POA: Diagnosis not present

## 2019-08-31 NOTE — Progress Notes (Signed)
Virtual Visit via Telephone Note   This visit type was conducted due to national recommendations for restrictions regarding the COVID-19 Pandemic (e.g. social distancing) in an effort to limit this patient's exposure and mitigate transmission in our community.  Due to her co-morbid illnesses, this patient is at least at moderate risk for complications without adequate follow up.  This format is felt to be most appropriate for this patient at this time.  The patient did not have access to video technology/had technical difficulties with video requiring transitioning to audio format only (telephone).  All issues noted in this document were discussed and addressed.  No physical exam could be performed with this format.  Please refer to the patient's chart for her  consent to telehealth for [facility].  Evaluation Performed:  Follow-up visit  This visit type was conducted due to national recommendations for restrictions regarding the COVID-19 Pandemic (e.g. social distancing).  This format is felt to be most appropriate for this patient at this time.  All issues noted in this document were discussed and addressed.  No physical exam was performed (except for noted visual exam findings with Video Visits).  Please refer to the patient's chart (MyChart message for video visits and phone note for telephone visits) for the patient's consent to telehealth for [facility].  Date:  09/03/2019   ID:  Christina Hernandez, DOB [birth year], MRN [id number]  Patient Location:  Home  Provider location:   Off-site  PCP:  Glendale Chard, MD  Cardiologist:  Glenetta Hew, MD 12/09/2018 Electrophysiologist:  None   Chief Complaint: Cardiology follow-up  History of Present Illness:    Christina Hernandez is a 69 y.o. female who presents via audio/video conferencing for a telehealth visit today.    69 y.o. yo female who has a hx of breast CA s/p lumpectomy w/ XRT and chemo, DM, HTN, HLD, FH  CAD, trigeminal neuralgia.  12/09/2018 office visit: pt concerned about CV risk, to get Ca++ score prior to f/u visit, has never had echo or any ischemic eval.  She does not exercise, but walks occasionally with her foster child. She helps an elderly lady as well. She feels she stays active, does a lot of walking daily. She goes to the grocery store and runs errands regularly.   She does not get chest pain or SOB with exertion. She had a partial knee replacement 03/2019, has recovered well from that. She feels she walks pretty well.   No LE edema, no PND. No orthopnea.   No palpitations, no presyncope or syncope.   She has significant problems with trigeminal neuralgia. She had the gamma knife procedure, but the pain got worse, not better.  The meds do not control it well.  Because of it, she is not eating as much.   The patient does not have symptoms concerning for COVID-19 infection (fever, chills, cough, or new shortness of breath).    Prior CV studies:   The following studies were reviewed today:  No cardiac studies  Past Medical History:  Diagnosis Date  . Arthritis   . Breast cancer, right breast [facility])    pending right breast lumpectomy on September 01, 2018  . Diabetes mellitus type II, controlled ([place])    On Kombiglyze  . Family history of ovarian cancer   . Family history of thyroid cancer   . GERD (gastroesophageal reflux disease)    rarely occurs- no meds  . Hyperlipidemia associated with type  2 diabetes mellitus ([place])   . Hypertension   . Hypothyroidism   . Personal history of radiation therapy   . Primary localized osteoarthritis of right knee 04/17/2019  . Trigeminal neuralgia of left side of face 2015   s/p Gamma knife   Past Surgical History:  Procedure Laterality Date  . BREAST BIOPSY Right 07/24/2018  . BREAST LUMPECTOMY Right 09/01/2018  . BREAST LUMPECTOMY WITH RADIOACTIVE SEED LOCALIZATION Right 09/01/2018   Procedure: RIGHT BREAST LUMPECTOMY WITH  RADIOACTIVE SEED LOCALIZATION;  Surgeon: [name], MD;  Location: [place];  Service: General;  Laterality: Right;  . COLONOSCOPY    . Gamma Knife Left 04/08/2016  . HYSTEROSCOPY WITH D & C  05/24/2011   Procedure: DILATATION AND CURETTAGE (D&C) /HYSTEROSCOPY;  Surgeon: [name], MD;  Location: [place] [facility];  Service: Gynecology;  Laterality: N/A;  . KNEE SURGERY Right 06/10/15   torn ligament  . PARTIAL KNEE ARTHROPLASTY Right 04/17/2019   Procedure: UNICOMPARTMENTAL KNEE;  Surgeon: [name], MD;  Location: [facility];  Service: Orthopedics;  Laterality: Right;  . THYROIDECTOMY N/A 09/08/2016   Procedure: TOTAL THYROIDECTOMY;  Surgeon: [name], MD;  Location: [place];  Service: ENT;  Laterality: N/A;  . TUBAL LIGATION    . uterine polyp    . WISDOM TOOTH EXTRACTION Left 2014     Current Meds  Medication Sig  . anastrozole (ARIMIDEX) 1 MG tablet Take 1 tablet (1 mg total) by mouth daily. Start Apr 29, 2019  . aspirin EC 325 MG tablet Take 1 tablet (325 mg total) by mouth 2 (two) times daily.  . baclofen (LIORESAL) 10 MG tablet Take 10 mg by mouth 3 (three) times daily.   . Biotin (BIOTIN MAXIMUM) 10000 MCG TBDP Take 10,000 mcg by mouth daily.   . cetirizine (ZYRTEC) 10 MG tablet Take 1 tablet (10 mg total) by mouth daily.  . Cholecalciferol (VITAMIN D) 125 MCG (5000 UT) CAPS Take 5,000 Units by mouth daily.  . COMBIGAN 0.2-0.5 % ophthalmic solution Place 1 drop into both eyes 2 (two) times daily.  . DULoxetine (CYMBALTA) 60 MG capsule Take 1 capsule (60 mg total) by mouth daily.  Marland Kitchen gabapentin (NEURONTIN) 300 MG capsule Take 3 capsules (900 mg total) by mouth 3 (three) times daily.  Marland Kitchen glucose blood (ONETOUCH VERIO) test strip Use as directed to check blood sugars daily dx: e11.22  . HYDROcodone-acetaminophen (NORCO) 10-325 MG tablet Take 1 tablet by mouth every 6 (six) hours as needed.  Marland Kitchen KOMBIGLYZE XR 5-500 MG TB24 TAKE 1 TABLET BY MOUTH DAILY BEFORE SUPPER.  Marland Kitchen  latanoprost (XALATAN) 0.005 % ophthalmic solution Place 1 drop into both eyes at bedtime.  Marland Kitchen losartan-hydrochlorothiazide (HYZAAR) 50-12.5 MG tablet TAKE 1 TABLET BY MOUTH EVERY DAY (Patient taking differently: Take 1 tablet by mouth daily. )  . meloxicam (MOBIC) 15 MG tablet Take 15 mg by mouth daily.  . ondansetron (ZOFRAN) 4 MG tablet Take 1 tablet (4 mg total) by mouth every 8 (eight) hours as needed for nausea or vomiting.  Glory Rosebush Delica Lancets [id number] MISC Use as directed to check blood sugars daily dx: e11.22  . pravastatin (PRAVACHOL) 40 MG tablet TAKE 1 TABLET BY MOUTH EVERY DAY (Patient taking differently: Take 40 mg by mouth daily. )  . SYNTHROID 75 MCG tablet Take 1 tablet (75 mcg total) by mouth See admin instructions. Take 75 mcg by mouth daily Monday - Saturday and hold on Sunday  . topiramate (TOPAMAX) 50 MG tablet  Take 100 mg by mouth 2 (two) times daily.      Allergies:   Cyclobenzaprine   Social History   Tobacco Use  . Smoking status: Never Smoker  . Smokeless tobacco: Never Used  Substance Use Topics  . Alcohol use: No  . Drug use: No     Family Hx: The patient's family history includes Cardiomyopathy in her sister; Congestive Heart Failure in her mother; Dementia in her paternal grandmother; Heart Problems in her father; Heart Problems (age of onset: 63) in her sister; Hypertension in her mother; Kidney failure in her mother; Ovarian cancer (age of onset: 81) in her sister; Pneumonia in her father; Stroke in her father, paternal grandfather, and paternal grandmother; Thyroid cancer (age of onset: 74) in her niece; Thyroid disease in her sister and sister. There is no history of Breast cancer.  ROS:   Please see the history of present illness.    All other systems reviewed and are negative.   Labs/Other Tests and Data Reviewed:    Recent Labs: 04/18/2019: Hemoglobin 10.7; Platelets 218 06/05/2019: ALT 18; BUN 14; Creatinine, Ser 0.78; Potassium 4.3; Sodium 143;  TSH 1.360   CBC    Component Value Date/Time   WBC 8.2 04/18/2019 0300   RBC 3.90 04/18/2019 0300   HGB 10.7 (L) 04/18/2019 0300   HGB 11.6 08/10/2018 1356   HCT 34.3 (L) 04/18/2019 0300   HCT 34.9 08/10/2018 1356   PLT 218 04/18/2019 0300   PLT 242 08/10/2018 1356   MCV 87.9 04/18/2019 0300   MCV 81 08/10/2018 1356   MCH 27.4 04/18/2019 0300   MCHC 31.2 04/18/2019 0300   RDW 13.2 04/18/2019 0300   RDW 13.0 08/10/2018 1356   LYMPHSABS 2.0 08/02/2018 0835   MONOABS 0.3 08/02/2018 0835   EOSABS 0.1 08/02/2018 0835   BASOSABS 0.0 08/02/2018 0835    CMP Latest Ref Rng & Units 06/05/2019 04/18/2019 04/10/2019  Glucose 65 - 99 mg/dL 81 138(H) 95  BUN 8 - 27 mg/dL 14 19 20   Creatinine 0.57 - 1.00 mg/dL 0.78 0.81 0.76  Sodium 134 - 144 mmol/L 143 138 140  Potassium 3.5 - 5.2 mmol/L 4.3 3.4(L) 3.0(L)  Chloride 96 - 106 mmol/L 104 107 106  CO2 20 - 29 mmol/L 25 21(L) 25  Calcium 8.7 - 10.3 mg/dL 9.6 9.0 9.4  Total Protein 6.0 - 8.5 g/dL 6.9 - -  Total Bilirubin 0.0 - 1.2 mg/dL 0.5 - -  Alkaline Phos 39 - 117 IU/L 107 - -  AST 0 - 40 IU/L 21 - -  ALT 0 - 32 IU/L 18 - -     Recent Lipid Panel Lab Results  Component Value Date/Time   CHOL 153 12/12/2018 12:17 PM   TRIG 101 12/12/2018 12:17 PM   HDL 53 12/12/2018 12:17 PM   CHOLHDL 2.9 12/12/2018 12:17 PM   LDLCALC 80 12/12/2018 12:17 PM    Wt Readings from Last 3 Encounters:  09/03/19 175 lb (79.4 kg)  06/05/19 172 lb 12.8 oz (78.4 kg)  04/17/19 173 lb 6.4 oz (78.7 kg)     Objective:    Vital Signs:  Ht 5\' 4"  (1.626 m)   Wt 175 lb (79.4 kg)   BMI 30.04 kg/m    69 y.o. female in no acute distress over the phone   ASSESSMENT & PLAN:    1.  Hypertension: -She does not check her blood pressure regularly, but does that whenever it is checked at the doctor's office,  it is good. -Her PCP follows this, no recent med changes. -Continue losartan HCTZ  2.  Cardiac risk counseling -She is having no ischemic symptoms.   At 1 point, she was worried about her cardiac risk, but is currently okay with testing only if she develops symptoms. -She is encouraged to control her blood pressure, diabetes, and cholesterol well. -These are followed by her PCP, LDL last year was 49 -She is encouraged to be as active as she is able.   COVID-19 Education: The signs and symptoms of COVID-19 were discussed with the patient and how to seek care for testing (follow up with PCP or arrange E-visit).  The importance of social distancing was discussed today.  Patient Risk:   After full review of this patient's clinical status, I feel that they are at least moderate risk at this time.  Time:   Today, I have spent 14 minutes with the patient with telehealth technology discussing cardiology and other health issues.     Medication Adjustments/Labs and Tests Ordered: Current medicines are reviewed at length with the patient today.  Concerns regarding medicines are outlined above.  Tests Ordered: No orders of the defined types were placed in this encounter.  Medication Changes: No orders of the defined types were placed in this encounter.   Disposition:  Follow up with Glenetta Hew, MD   Signed, Rosaria Ferries, PA-C  09/03/2019 3:07 PM    [place]

## 2019-09-03 ENCOUNTER — Telehealth (INDEPENDENT_AMBULATORY_CARE_PROVIDER_SITE_OTHER): Payer: Medicare PPO | Admitting: Physician Assistant

## 2019-09-03 ENCOUNTER — Encounter: Payer: Self-pay | Admitting: Physician Assistant

## 2019-09-03 VITALS — Ht 64.0 in | Wt 175.0 lb

## 2019-09-03 DIAGNOSIS — I10 Essential (primary) hypertension: Secondary | ICD-10-CM | POA: Diagnosis not present

## 2019-09-03 DIAGNOSIS — Z7189 Other specified counseling: Secondary | ICD-10-CM

## 2019-09-03 NOTE — Patient Instructions (Signed)
Medication Instructions:  Your physician recommends that you continue on your current medications as directed. Please refer to the Current Medication list given to you today.  *If you need a refill on your cardiac medications before your next appointment, please call your pharmacy*    Follow-Up: At [facility], you and your health needs are our priority.  As part of our continuing mission to provide you with exceptional heart care, we have created designated Provider Care Teams.  These Care Teams include your primary Cardiologist (physician) and Advanced Practice Providers (APPs -  Physician Assistants and Nurse Practitioners) who all work together to provide you with the care you need, when you need it.  We recommend signing up for the patient portal called "MyChart".  Sign up information is provided on this After Visit Summary.  MyChart is used to connect with patients for Virtual Visits (Telemedicine).  Patients are able to view lab/test results, encounter notes, upcoming appointments, etc.  Non-urgent messages can be sent to your provider as well.   To learn more about what you can do with MyChart, go to [email or web address].    Your next appointment:   12 month(s) (Please call our office 2 months in advance to schedule your follow-up appointment)  The format for your next appointment:   In Person  Provider:   You may see Glenetta Hew, MD or one of the following Advanced Practice Providers on your designated Care Team:    Rosaria Ferries, PA-C  Jory Sims, DNP, ANP  Cadence Kathlen Mody, NP

## 2019-09-05 DIAGNOSIS — H401133 Primary open-angle glaucoma, bilateral, severe stage: Secondary | ICD-10-CM | POA: Diagnosis not present

## 2019-09-13 ENCOUNTER — Other Ambulatory Visit: Payer: Self-pay | Admitting: Internal Medicine

## 2019-09-17 ENCOUNTER — Other Ambulatory Visit: Payer: Self-pay

## 2019-09-17 MED ORDER — ACCU-CHEK GUIDE VI STRP: ORAL_STRIP | 2 refills | Status: DC

## 2019-09-17 MED ORDER — ACCU-CHEK GUIDE W/DEVICE KIT: 1.0000 | PACK | Freq: Every day | 1 refills | Status: DC

## 2019-09-17 MED ORDER — ACCU-CHEK FASTCLIX LANCETS MISC: 2 refills | Status: DC

## 2019-09-25 ENCOUNTER — Other Ambulatory Visit: Payer: Self-pay | Admitting: Diagnostic Neuroimaging

## 2019-10-01 ENCOUNTER — Encounter: Payer: Self-pay | Admitting: Internal Medicine

## 2019-10-01 ENCOUNTER — Other Ambulatory Visit: Payer: Self-pay

## 2019-10-01 ENCOUNTER — Ambulatory Visit: Payer: Medicare PPO | Admitting: Internal Medicine

## 2019-10-01 VITALS — BP 144/90 | HR 98 | Temp 98.0°F | Ht 65.2 in | Wt 181.0 lb

## 2019-10-01 DIAGNOSIS — M7989 Other specified soft tissue disorders: Secondary | ICD-10-CM | POA: Diagnosis not present

## 2019-10-01 DIAGNOSIS — I10 Essential (primary) hypertension: Secondary | ICD-10-CM

## 2019-10-01 DIAGNOSIS — Z9114 Patient's other noncompliance with medication regimen: Secondary | ICD-10-CM

## 2019-10-01 DIAGNOSIS — E039 Hypothyroidism, unspecified: Secondary | ICD-10-CM | POA: Diagnosis not present

## 2019-10-01 DIAGNOSIS — Z6829 Body mass index (BMI) 29.0-29.9, adult: Secondary | ICD-10-CM

## 2019-10-01 DIAGNOSIS — E1165 Type 2 diabetes mellitus with hyperglycemia: Secondary | ICD-10-CM

## 2019-10-01 DIAGNOSIS — E663 Overweight: Secondary | ICD-10-CM

## 2019-10-01 NOTE — Progress Notes (Signed)
This visit occurred during the SARS-CoV-2 public health emergency.  Safety protocols were in place, including screening questions prior to the visit, additional usage of staff PPE, and extensive cleaning of exam room while observing appropriate contact time as indicated for disinfecting solutions.  Subjective:     Patient ID: Christina Hernandez , female    DOB: Dec 13, 1950 , 69 y.o.   MRN: [id number]   Chief Complaint  Patient presents with  . Hypertension    has not taken medication today   . Diabetes    HPI  She is here today for diabetes and BP check.  She states she has yet to take her meds this morning. She admits that she does not have a set schedule to take her meds. Admits to missing thyroid meds on occasion.   Hypertension This is a chronic problem. The current episode started more than 1 year ago. The problem has been gradually improving since onset. The problem is controlled. Pertinent negatives include no blurred vision, chest pain, palpitations or shortness of breath. Compliance problems include exercise.   Diabetes She presents for her follow-up diabetic visit. She has type 2 diabetes mellitus. There are no hypoglycemic associated symptoms. There are no diabetic associated symptoms. Pertinent negatives for diabetes include no blurred vision and no chest pain. There are no hypoglycemic complications. Risk factors for coronary artery disease include diabetes mellitus, dyslipidemia, hypertension, sedentary lifestyle and post-menopausal. She participates in exercise intermittently. An ACE inhibitor/angiotensin II receptor blocker is being taken. Eye exam is current.     Past Medical History:  Diagnosis Date  . Arthritis   . Breast cancer, right breast [facility])    pending right breast lumpectomy on September 01, 2018  . Diabetes mellitus type II, controlled ([place])    On Kombiglyze  . Family history of ovarian cancer   . Family history of thyroid cancer   . GERD (gastroesophageal  reflux disease)    rarely occurs- no meds  . Hyperlipidemia associated with type 2 diabetes mellitus ([place])   . Hypertension   . Hypothyroidism   . Personal history of radiation therapy   . Primary localized osteoarthritis of right knee 04/17/2019  . S/P knee replacement 04/17/2019  . Trigeminal neuralgia of left side of face 2015   s/p Gamma knife     Family History  Problem Relation Age of Onset  . Hypertension Mother   . Kidney failure Mother   . Congestive Heart Failure Mother   . Heart Problems Father   . Stroke Father   . Pneumonia Father   . Heart Problems Sister 40       heart transplant  . Stroke Paternal Grandmother   . Dementia Paternal Grandmother   . Stroke Paternal Grandfather   . Cardiomyopathy Sister   . Ovarian cancer Sister 71       d. 64  . Thyroid disease Sister   . Thyroid disease Sister   . Thyroid cancer Niece 55  . Breast cancer Neg Hx      Current Outpatient Medications:  .  Accu-Chek FastClix Lancets MISC, Use as instructed to check blood sugars 1 time per day dx: e11.22, Disp: 150 each, Rfl: 2 .  anastrozole (ARIMIDEX) 1 MG tablet, Take 1 tablet (1 mg total) by mouth daily. Start Apr 29, 2019, Disp: 90 tablet, Rfl: 4 .  aspirin EC 325 MG tablet, Take 1 tablet (325 mg total) by mouth 2 (two) times daily., Disp: 60 tablet, Rfl: 0 .  baclofen (  LIORESAL) 10 MG tablet, Take 10 mg by mouth 3 (three) times daily. , Disp: , Rfl:  .  Blood Glucose Monitoring Suppl (ACCU-CHEK GUIDE) w/Device KIT, 1 each by Does not apply route daily. Dx: e11.22, Disp: 1 kit, Rfl: 1 .  cetirizine (ZYRTEC) 10 MG tablet, Take 1 tablet (10 mg total) by mouth daily., Disp: 30 tablet, Rfl: 3 .  Cholecalciferol (VITAMIN D) 125 MCG (5000 UT) CAPS, Take 5,000 Units by mouth daily., Disp: , Rfl:  .  COMBIGAN 0.2-0.5 % ophthalmic solution, Place 1 drop into both eyes 2 (two) times daily., Disp: , Rfl:  .  DULoxetine (CYMBALTA) 60 MG capsule, Take 1 capsule (60 mg total) by mouth  daily., Disp: 90 capsule, Rfl: 4 .  gabapentin (NEURONTIN) 300 MG capsule, Take 3 capsules (900 mg total) by mouth 3 (three) times daily., Disp: 810 capsule, Rfl: 4 .  glucose blood (ACCU-CHEK GUIDE) test strip, Use as instructed to check blood sugars 1 time per day dx: e11.22, Disp: 150 each, Rfl: 2 .  HYDROcodone-acetaminophen (NORCO) 10-325 MG tablet, Take 1 tablet by mouth every 6 (six) hours as needed., Disp: 28 tablet, Rfl: 0 .  KOMBIGLYZE XR 5-500 MG TB24, TAKE 1 TABLET BY MOUTH DAILY BEFORE SUPPER., Disp: 90 tablet, Rfl: 1 .  latanoprost (XALATAN) 0.005 % ophthalmic solution, Place 1 drop into both eyes at bedtime., Disp: , Rfl:  .  losartan-hydrochlorothiazide (HYZAAR) 50-12.5 MG tablet, TAKE 1 TABLET BY MOUTH EVERY DAY, Disp: 90 tablet, Rfl: 1 .  meloxicam (MOBIC) 15 MG tablet, Take 15 mg by mouth daily., Disp: , Rfl:  .  ondansetron (ZOFRAN) 4 MG tablet, Take 1 tablet (4 mg total) by mouth every 8 (eight) hours as needed for nausea or vomiting., Disp: 10 tablet, Rfl: 0 .  pravastatin (PRAVACHOL) 40 MG tablet, TAKE 1 TABLET BY MOUTH EVERY DAY (Patient taking differently: Take 40 mg by mouth daily. ), Disp: 90 tablet, Rfl: 2 .  SYNTHROID 75 MCG tablet, Take 1 tablet (75 mcg total) by mouth See admin instructions. Take 75 mcg by mouth daily Monday - Saturday and hold on Sunday, Disp: 90 tablet, Rfl: 1 .  topiramate (TOPAMAX) 50 MG tablet, Take 100 mg by mouth 2 (two) times daily. , Disp: , Rfl:  .  Biotin (BIOTIN MAXIMUM) 10000 MCG TBDP, Take 10,000 mcg by mouth daily. , Disp: , Rfl:    Allergies  Allergen Reactions  . Cyclobenzaprine Nausea Only     Review of Systems  Constitutional: Negative.   Eyes: Negative for blurred vision.  Respiratory: Negative.  Negative for shortness of breath.   Cardiovascular: Negative.  Negative for chest pain and palpitations.  Gastrointestinal: Negative.   Neurological: Negative.   Psychiatric/Behavioral: Negative.      Today's Vitals   10/01/19  0856  BP: (!) 144/90  Pulse: 98  Temp: 98 F (36.7 C)  TempSrc: Oral  Weight: 181 lb (82.1 kg)  Height: 5' 5.2" (1.656 m)   Body mass index is 29.94 kg/m.   Wt Readings from Last 3 Encounters:  10/01/19 181 lb (82.1 kg)  09/03/19 175 lb (79.4 kg)  06/05/19 172 lb 12.8 oz (78.4 kg)     Objective:  Physical Exam Vitals and nursing note reviewed.  Constitutional:      Appearance: Normal appearance.  HENT:     Head: Normocephalic and atraumatic.  Cardiovascular:     Rate and Rhythm: Normal rate and regular rhythm.     Heart sounds: Normal heart  sounds.  Pulmonary:     Effort: Pulmonary effort is normal.     Breath sounds: Normal breath sounds.  Skin:    General: Skin is warm.     Comments: Round, well demarcated, soft tissue mass on left upper shoulder. No overlying erythema. No tenderness to palpation.   Neurological:     General: No focal deficit present.     Mental Status: She is alert.  Psychiatric:        Mood and Affect: Mood normal.        Behavior: Behavior normal.         Assessment And Plan:     1. Essential hypertension  Chronic, fair control. She will continue with current meds for now. She is encouraged to take meds at a set time every morning.   2. Uncontrolled type 2 diabetes mellitus with hyperglycemia (HCC)  Chronic, has been well controlled. I will check labs as listed below. I will adjust meds as needed. Again, importance of medication compliance was stressed to the patient.   - Hemoglobin A1c - BMP8+EGFR - Lipid panel   3. Hypothyroidism, unspecified type  Previous labs reviewed, TSH therapeutic in Dec 2020. I will recheck levels at her next visit.   4. Overweight (BMI 25.0-29.9)  Pt is aware of 9 pound weight gain since Dec 2020. She is encouraged to incorporate    5. Soft tissue mass  Located on left upper shoulder. Suggestive of lipoma. She was given information to read on this. Pt advised to watch area to see if this gets any  larger. No need for further evaluation since she is asymptomatic at this time.    [name], MD    THE PATIENT IS ENCOURAGED TO PRACTICE SOCIAL DISTANCING DUE TO THE COVID-19 PANDEMIC.

## 2019-10-01 NOTE — Patient Instructions (Signed)
Lipoma  A lipoma is a noncancerous (benign) tumor that is made up of fat cells. This is a very common type of soft-tissue growth. Lipomas are usually found under the skin (subcutaneous). They may occur in any tissue of the body that contains fat. Common areas for lipomas to appear include the back, arms, shoulders, buttocks, and thighs. Lipomas grow slowly, and they are usually painless. Most lipomas do not cause problems and do not require treatment. What are the causes? The cause of this condition is not known. What increases the risk? You are more likely to develop this condition if:  You are 40-60 years old.  You have a family history of lipomas. What are the signs or symptoms? A lipoma usually appears as a small, round bump under the skin. In most cases, the lump will:  Feel soft or rubbery.  Not cause pain or other symptoms. However, if a lipoma is located in an area where it pushes on nerves, it can become painful or cause other symptoms. How is this diagnosed? A lipoma can usually be diagnosed with a physical exam. You may also have tests to confirm the diagnosis and to rule out other conditions. Tests may include:  Imaging tests, such as a CT scan or an MRI.  Removal of a tissue sample to be looked at under a microscope (biopsy). How is this treated? Treatment for this condition depends on the size of the lipoma and whether it is causing any symptoms.  For small lipomas that are not causing problems, no treatment is needed.  If a lipoma is bigger or it causes problems, surgery may be done to remove the lipoma. Lipomas can also be removed to improve appearance. Most often, the procedure is done after applying a medicine that numbs the area (local anesthetic).  Liposuction may be done to reduce the size of the lipoma before it is removed through surgery, or it may be done to remove the lipoma. Lipomas are removed with this method in order to limit incision size and scarring. A  liposuction tube is inserted through a small incision into the lipoma, and the contents of the lipoma are removed through the tube with suction. Follow these instructions at home:  Watch your lipoma for any changes.  Keep all follow-up visits as told by your health care provider. This is important. Contact a health care provider if:  Your lipoma becomes larger or hard.  Your lipoma becomes painful, red, or increasingly swollen. These could be signs of infection or a more serious condition. Get help right away if:  You develop tingling or numbness in an area near the lipoma. This could indicate that the lipoma is causing nerve damage. Summary  A lipoma is a noncancerous tumor that is made up of fat cells.  Most lipomas do not cause problems and do not require treatment.  If a lipoma is bigger or it causes problems, surgery may be done to remove the lipoma.  Contact a health care provider if your lipoma becomes larger or hard, or if it becomes painful, red, or increasingly swollen. Pain, redness, and swelling could be signs of infection or a more serious condition. This information is not intended to replace advice given to you by your health care provider. Make sure you discuss any questions you have with your health care provider. Document Revised: 01/29/2019 Document Reviewed: 01/29/2019 Elsevier Patient Education  2020 [employer]  

## 2019-10-02 LAB — BMP8+EGFR
BUN/Creatinine Ratio: 24 (ref 12–28)
BUN: 14 mg/dL (ref 8–27)
CO2: 28 mmol/L (ref 20–29)
Calcium: 9.6 mg/dL (ref 8.7–10.3)
Chloride: 105 mmol/L (ref 96–106)
Creatinine, Ser: 0.58 mg/dL (ref 0.57–1.00)
GFR calc Af Amer: 108 mL/min/{1.73_m2} (ref >59)
GFR calc non Af Amer: 94 mL/min/{1.73_m2} (ref >59)
Glucose: 105 mg/dL — ABNORMAL HIGH (ref 65–99)
Potassium: 4.2 mmol/L (ref 3.5–5.2)
Sodium: 143 mmol/L (ref 134–144)

## 2019-10-02 LAB — LIPID PANEL
Chol/HDL Ratio: 2.9 ratio (ref 0.0–4.4)
Cholesterol, Total: 165 mg/dL (ref 100–199)
HDL: 56 mg/dL (ref >39)
LDL Chol Calc (NIH): 90 mg/dL (ref 0–99)
Triglycerides: 106 mg/dL (ref 0–149)
VLDL Cholesterol Cal: 19 mg/dL (ref 5–40)

## 2019-10-02 LAB — HEMOGLOBIN A1C
Est. average glucose Bld gHb Est-mCnc: 128 mg/dL
Hgb A1c MFr Bld: 6.1 % — ABNORMAL HIGH (ref 4.8–5.6)

## 2019-10-04 ENCOUNTER — Ambulatory Visit: Payer: Medicare Other | Admitting: Internal Medicine

## 2019-10-15 ENCOUNTER — Other Ambulatory Visit: Payer: Self-pay | Admitting: Diagnostic Neuroimaging

## 2019-10-22 ENCOUNTER — Telehealth: Payer: Self-pay | Admitting: Diagnostic Neuroimaging

## 2019-10-22 ENCOUNTER — Encounter: Payer: Self-pay | Admitting: *Deleted

## 2019-10-22 MED ORDER — DULOXETINE HCL 60 MG PO CPEP: 60.0000 mg | ORAL_CAPSULE | Freq: Every day | ORAL | 4 refills | Status: DC

## 2019-10-22 NOTE — Telephone Encounter (Signed)
Patient has upcoming follow up. Refill sent as requested.

## 2019-10-22 NOTE — Telephone Encounter (Signed)
Pt requesting a refill for DULoxetine (CYMBALTA) 60 MG capsule sent to [facility]

## 2019-11-01 ENCOUNTER — Other Ambulatory Visit: Payer: Self-pay | Admitting: Internal Medicine

## 2019-11-01 DIAGNOSIS — J069 Acute upper respiratory infection, unspecified: Secondary | ICD-10-CM

## 2019-11-19 ENCOUNTER — Encounter: Payer: Self-pay | Admitting: Diagnostic Neuroimaging

## 2019-11-19 ENCOUNTER — Other Ambulatory Visit: Payer: Self-pay

## 2019-11-19 ENCOUNTER — Ambulatory Visit: Payer: Medicare PPO | Admitting: Diagnostic Neuroimaging

## 2019-11-19 VITALS — BP 104/69 | HR 105 | Ht 65.0 in | Wt 176.0 lb

## 2019-11-19 DIAGNOSIS — G501 Atypical facial pain: Secondary | ICD-10-CM | POA: Diagnosis not present

## 2019-11-19 NOTE — Progress Notes (Signed)
[facility]  PATIENT: Christina Hernandez DOB: April 19, 1951  REFERRING CLINICIAN:  HISTORY FROM: patient  REASON FOR VISIT: follow up   HISTORICAL  CHIEF COMPLAINT:  Chief Complaint  Patient presents with  . Facial Pain    rm 7, "I went to pain clinic but didn't feel any better, they gave me baclofen, topiramate; I am taking cymbalta, gabapentin; my facial pain is awful"     HISTORY OF PRESENT ILLNESS:   UPDATE (11/19/19, VRP): Since last visit, doing poorly. Symptoms are progressive. Severity is moderate-severe. Has seen pain mgmt (tried topiramate and baclofen). No alleviating or aggravating factors. Tolerating meds. Feels hopeless.   UPDATE (07/04/18, VRP): Since last visit, doing poorly. Had gamma knife therapy in Oct 2017, and sxs worsened. Has seen [facility] and [facility] and pain clinics with no relief. Symptoms are worsening. Severity is moderate. No alleviating or aggravating factors. Tolerating gabapentin 9110m TID. Ran out of cymbalta.     UPDATE 12/17/15: Since last visit, symptoms are stable. Pain in left face ranging 5/10-8/10. Tolerating meds. On gabapentin 9071mTID + duloxetine 3068maily. Depression stable (mild).   UPDATE 06/18/15: Since last visit, tried lyrica --> caused side effect of sleepiness. Now on gabapentin 900m79mD. More depression as a result of left face pain (6/10 severity).   UPDATE 11/26/14: Since last visit, sxs have continued. Tried carbamazepine x 2 months without relief. Now off CBZ. Still on gabapentin. More depression and fatigue. Pain is 7/10 in severity.   UPDATE 07/30/14: Still with left lower lip numbness and burning pain. On gabapentin 600-900mg4m, with only mild benefit.   UPDATE 04/26/14: Since last visit, stable. Still with left chin numbness and left jaw pain. On gabapentin 600mg 55mwithout significant relief. No new symptoms. Has appt with endocrine re: thyroid inflammation/enlargement. Also need parathyroid eval  according to dentist, based on calcium levels and bone appearance.   PRIOR HPI (03/13/14): 69 yea47old right-handed female here for evaluation of left trigeminal neuralgia. January 2014 patient had second and third molar impaction, treated with extraction. Patient did well with the procedure. In May 2015 she developed "heavy feeling" in her left jaw. In 12/31/2013 patient had severe pain in her left lower jaw. She numbness in her left lower teeth, gums, Chin, jaw. Patient was noted to have left submandibular swelling, treated with antibiotics. CT scan showed florid cemento-osseous dysplasia, and formal bone biopsy showed vital bone and no osteomyelitis. Patient was presumed to have left trigeminal neuralgia, mandibular branch, and started on gabapentin 300 mg daily. Since that time swelling in the left abdomen the region has reduced. She still has pain in the left jaw. She has significant numbness, tingling, very sensation in her left chin and left lower lip region.    REVIEW OF SYSTEMS: Full 14 system review of systems performed and negative except: numbness snoring insomnia fatigue aching muscles.    ALLERGIES: Allergies  Allergen Reactions  . Cyclobenzaprine Nausea Only    HOME MEDICATIONS: Outpatient Medications Prior to Visit  Medication Sig Dispense Refill  . Accu-Chek FastClix Lancets MISC Use as instructed to check blood sugars 1 time per day dx: e11.22 150 each 2  . anastrozole (ARIMIDEX) 1 MG tablet Take 1 tablet (1 mg total) by mouth daily. Start Apr 29, 2019 90 tablet 4  . aspirin EC 325 MG tablet Take 1 tablet (325 mg total) by mouth 2 (two) times daily. 60 tablet 0  . Biotin (BIOTIN MAXIMUM) 10000 MCG TBDP Take 10,000 mcg  by mouth daily.     . Blood Glucose Monitoring Suppl (ACCU-CHEK GUIDE) w/Device KIT 1 each by Does not apply route daily. Dx: e11.22 1 kit 1  . cetirizine (ZYRTEC) 10 MG tablet Take 1 tablet (10 mg total) by mouth daily. 30 tablet 3  . Cholecalciferol (VITAMIN D)  125 MCG (5000 UT) CAPS Take 5,000 Units by mouth daily.    . COMBIGAN 0.2-0.5 % ophthalmic solution Place 1 drop into both eyes 2 (two) times daily.    . DULoxetine (CYMBALTA) 60 MG capsule Take 1 capsule (60 mg total) by mouth daily. 90 capsule 4  . gabapentin (NEURONTIN) 300 MG capsule Take 3 capsules (900 mg total) by mouth 3 (three) times daily. 810 capsule 4  . glucose blood (ACCU-CHEK GUIDE) test strip Use as instructed to check blood sugars 1 time per day dx: e11.22 150 each 2  . HYDROcodone-acetaminophen (NORCO) 10-325 MG tablet Take 1 tablet by mouth every 6 (six) hours as needed. 28 tablet 0  . KOMBIGLYZE XR 5-500 MG TB24 TAKE 1 TABLET BY MOUTH DAILY BEFORE SUPPER. 90 tablet 1  . latanoprost (XALATAN) 0.005 % ophthalmic solution Place 1 drop into both eyes at bedtime.    Marland Kitchen losartan-hydrochlorothiazide (HYZAAR) 50-12.5 MG tablet TAKE 1 TABLET BY MOUTH EVERY DAY 90 tablet 1  . meloxicam (MOBIC) 15 MG tablet Take 15 mg by mouth daily.    . ondansetron (ZOFRAN) 4 MG tablet Take 1 tablet (4 mg total) by mouth every 8 (eight) hours as needed for nausea or vomiting. 10 tablet 0  . pravastatin (PRAVACHOL) 40 MG tablet TAKE 1 TABLET BY MOUTH EVERY DAY 90 tablet 2  . SYNTHROID 75 MCG tablet Take 1 tablet (75 mcg total) by mouth See admin instructions. Take 75 mcg by mouth daily Monday - Saturday and hold on Sunday 90 tablet 1  . topiramate (TOPAMAX) 50 MG tablet Take 100 mg by mouth 2 (two) times daily.     . baclofen (LIORESAL) 10 MG tablet Take 10 mg by mouth 3 (three) times daily.      No facility-administered medications prior to visit.    PAST MEDICAL HISTORY: Past Medical History:  Diagnosis Date  . Arthritis   . Breast cancer, right breast [facility])    pending right breast lumpectomy on September 01, 2018  . Diabetes mellitus type II, controlled ([place])    On Kombiglyze  . Family history of ovarian cancer   . Family history of thyroid cancer   . GERD (gastroesophageal reflux disease)     rarely occurs- no meds  . Hyperlipidemia associated with type 2 diabetes mellitus ([place])   . Hypertension   . Hypothyroidism   . Personal history of radiation therapy   . Primary localized osteoarthritis of right knee 04/17/2019  . S/P knee replacement 04/17/2019  . Trigeminal neuralgia of left side of face 2015   s/p Gamma knife    PAST SURGICAL HISTORY: Past Surgical History:  Procedure Laterality Date  . BREAST BIOPSY Right 07/24/2018  . BREAST LUMPECTOMY Right 09/01/2018  . BREAST LUMPECTOMY WITH RADIOACTIVE SEED LOCALIZATION Right 09/01/2018   Procedure: RIGHT BREAST LUMPECTOMY WITH RADIOACTIVE SEED LOCALIZATION;  Surgeon: [name], MD;  Location: [facility];  Service: General;  Laterality: Right;  . COLONOSCOPY    . Gamma Knife Left 04/08/2016  . HYSTEROSCOPY WITH D & C  05/24/2011   Procedure: DILATATION AND CURETTAGE (D&C) /HYSTEROSCOPY;  Surgeon: [name], MD;  Location: [facility];  Service: Gynecology;  Laterality: N/A;  . KNEE SURGERY Right 06/10/15   torn ligament  . PARTIAL KNEE ARTHROPLASTY Right 04/17/2019   Procedure: UNICOMPARTMENTAL KNEE;  Surgeon: [name], MD;  Location: [facility];  Service: Orthopedics;  Laterality: Right;  . THYROIDECTOMY N/A 09/08/2016   Procedure: TOTAL THYROIDECTOMY;  Surgeon: [name], MD;  Location: [facility];  Service: ENT;  Laterality: N/A;  . TUBAL LIGATION    . uterine polyp    . WISDOM TOOTH EXTRACTION Left 2014    FAMILY HISTORY: Family History  Problem Relation Age of Onset  . Hypertension Mother   . Kidney failure Mother   . Congestive Heart Failure Mother   . Heart Problems Father   . Stroke Father   . Pneumonia Father   . Heart Problems Sister 40       heart transplant  . Stroke Paternal Grandmother   . Dementia Paternal Grandmother   . Stroke Paternal Grandfather   . Cardiomyopathy Sister   . Ovarian cancer Sister 66       d. 52  . Thyroid disease Sister   . Thyroid disease Sister   . Thyroid  cancer Niece 79  . Breast cancer Neg Hx     SOCIAL HISTORY:  Social History   Socioeconomic History  . Marital status: Married    Spouse name: [name]  . Number of children: 4  . Years of education: BS  . Highest education level: Not on file  Occupational History  . Occupation: Retired    Fish farm manager: OTHER  Tobacco Use  . Smoking status: Never Smoker  . Smokeless tobacco: Never Used  Substance and Sexual Activity  . Alcohol use: No  . Drug use: No  . Sexual activity: Not Currently  Other Topics Concern  . Not on file  Social History Narrative   Patient lives at home with family.   Caffeine Use: occasionally      Lives at home with husband and son.  Is a retired Psychologist, prison and probation services Strain:   . Difficulty of Paying Living Expenses:   Food Insecurity:   . Worried About Charity fundraiser in the Last Year:   . Arboriculturist in the Last Year:   Transportation Needs:   . Film/video editor (Medical):   Marland Kitchen Lack of Transportation (Non-Medical):   Physical Activity:   . Days of Exercise per Week:   . Minutes of Exercise per Session:   Stress:   . Feeling of Stress :   Social Connections:   . Frequency of Communication with Friends and Family:   . Frequency of Social Gatherings with Friends and Family:   . Attends Religious Services:   . Active Member of Clubs or Organizations:   . Attends Archivist Meetings:   Marland Kitchen Marital Status:   Intimate Partner Violence:   . Fear of Current or Ex-Partner:   . Emotionally Abused:   Marland Kitchen Physically Abused:   . Sexually Abused:      PHYSICAL EXAM  Vitals:   11/19/19 1318  BP: 104/69  Pulse: (!) 105  Weight: 176 lb (79.8 kg)  Height: '5\' 5"'  (1.651 m)    Not recorded      Body mass index is 29.29 kg/m.  GENERAL EXAM: Patient is in no distress; well developed, nourished and groomed; neck is supple; DEPRESSED AFFECT  CARDIOVASCULAR: Regular rate and rhythm, no murmurs,  no carotid bruits  NEUROLOGIC: MENTAL STATUS: awake,  alert, language fluent, comprehension intact, naming intact, fund of knowledge appropriate CRANIAL NERVE: pupils equal and reactive to light, visual fields full to confrontation, extraocular muscles intact, no nystagmus, SENSATION NORMAL TO LIGHT TOUCH, FACIAL STRENGTH SYMM, hearing intact, palate elevates symmetrically, uvula midline, shoulder shrug symmetric, tongue midline. MOTOR: normal bulk and tone, full strength in the BUE, BLE SENSORY: normal and symmetric to light touch, pinprick COORDINATION: finger-nose-finger, fine finger movements normal REFLEXES: deep tendon reflexes TRACE and symmetric GAIT/STATION: narrow based gait    DIAGNOSTIC DATA (LABS, IMAGING, TESTING) - I reviewed patient records, labs, notes, testing and imaging myself where available.  Lab Results  Component Value Date   WBC 8.2 04/18/2019   HGB 10.7 (L) 04/18/2019   HCT 34.3 (L) 04/18/2019   MCV 87.9 04/18/2019   PLT 218 04/18/2019      Component Value Date/Time   NA 143 10/01/2019 0930   K 4.2 10/01/2019 0930   CL 105 10/01/2019 0930   CO2 28 10/01/2019 0930   GLUCOSE 105 (H) 10/01/2019 0930   GLUCOSE 138 (H) 04/18/2019 0300   BUN 14 10/01/2019 0930   CREATININE 0.58 10/01/2019 0930   CREATININE 0.77 08/02/2018 0835   CALCIUM 9.6 10/01/2019 0930   PROT 6.9 06/05/2019 1524   ALBUMIN 4.4 06/05/2019 1524   AST 21 06/05/2019 1524   AST 18 08/02/2018 0835   ALT 18 06/05/2019 1524   ALT 22 08/02/2018 0835   ALKPHOS 107 06/05/2019 1524   BILITOT 0.5 06/05/2019 1524   BILITOT 0.7 08/02/2018 0835   GFRNONAA 94 10/01/2019 0930   GFRNONAA >60 08/02/2018 0835   GFRAA 108 10/01/2019 0930   GFRAA >60 08/02/2018 0835   Lab Results  Component Value Date   CHOL 165 10/01/2019   Lab Results  Component Value Date   HGBA1C 6.1 (H) 10/01/2019   No results found for: [id number] Lab Results  Component Value Date   TSH 1.360 06/05/2019     02/2314  MRI brain 1. Few punctate periventricular and subcortical foci of non-specific gliosis. These findings are non-specific and considerations include autoimmune, inflammatory, post-infectious, microvascular ischemic or migraine associated etiologies.  2. No acute findings.  3. Of note, this study was performed without IV contrast, as IV access could not be obtained.    ASSESSMENT AND PLAN  69 y.o. year old female here with left trigeminal nerve pain, in setting of multiple dental procedures in the left mandibular region and presumed infx/abscess treated with antibiotics. S/p gamma knife therapy in Oct 2017.  Also with depression.  Dx:  Atypical facial pain    PLAN:  ATYPICAL FACIAL PAIN - continue gabapentin 927m three times a day  - continue duloxetine 638mdaily - continue topiramate 10042mwice a day and baclofen 80m44mree times a day  - follow up with pain mgmt clinic (for future mgmt)  DEPRESSION (related to chronic pain syndrome) - follow up with PCP; consider psychiatry / psychology  Return for pending if symptoms worsen or fail to improve, return to PCP.    VIKRPenni Bombard [id number] in Neurology, Neurophysiology and Neuroimaging  [facility] [id number]itLititzeP[place] [phone]

## 2019-12-05 DIAGNOSIS — H401133 Primary open-angle glaucoma, bilateral, severe stage: Secondary | ICD-10-CM | POA: Diagnosis not present

## 2019-12-08 ENCOUNTER — Other Ambulatory Visit: Payer: Self-pay | Admitting: Internal Medicine

## 2019-12-09 NOTE — Progress Notes (Signed)
[place]  [phone] [phone]     ID: Christina Hernandez DOB: Mar 25, 1951  MR#: [id number]  BUL#:[id number]  Patient Care Team: Glendale Chard, MD as PCP - General (Internal Medicine) [name], MD as PCP - Cardiology (Cardiology) [name], MD as Consulting Physician (Obstetrics and Gynecology) [name], MD as Consulting Physician (General Surgery) Ronnie Mallette, [name], MD as Consulting Physician (Oncology) [name], MD as Consulting Physician (Radiation Oncology) [name], Earlean Polka, MD as Consulting Physician (Neurology) [name], MD as Consulting Physician (Pulmonary Disease) [name], MD as Consulting Physician (Orthopedic Surgery) Chauncey Cruel, MD OTHER MD:   CHIEF COMPLAINT: Estrogen receptor positive breast cancer  CURRENT TREATMENT: Anastrozole   INTERVAL HISTORY: Christina Hernandez returns today for follow up of her noninvasive breast cancer.  She started on anastrozole on 04/29/2019.  She is tolerating this well, with occasional hot flashes (which she was not having before).  Since her last visit, she underwent bilateral diagnostic mammography with tomography at [facility] on 07/13/2019 showing: breast density category C; no evidence of malignancy in either breast.  She also underwent partial knee replacement on 04/17/2019 under Dr. [name]   REVIEW OF SYSTEMS: Christina Hernandez had right knee surgery under Dr. [name].  She feels that went well, but she is not doing her exercises and in fact she is not doing any exercise right now.  She does not know why she is not doing them.  She is a little bit discouraged about her trigeminal neuralgia but this is being handled through the pain clinic/Dr.Harkins.  She and her husband received the Moderna vaccine without complications.  Aside from these issues the detailed review of systems today was stable   HISTORY OF CURRENT ILLNESS: From the original intake  note:  Christina Hernandez had routine screening mammography on 07/10/2018 showing a possible abnormality in the right breast. She underwent bilateral diagnostic mammography with tomography and right breast ultrasonography at [facility] on 07/21/2018 showing: indeterminate mass at the 3:30 position of the right breast measuring 3 x 4 x 4 mm; normal right axilla.  Accordingly on 07/24/2018 she proceeded to biopsy of the right breast area in question. The pathology ([id number]) from this procedure showed: ductal carcinoma in situ, low grade, may be involving an underlying papillary lesion. Prognostic indicators significant for: estrogen receptor, 100% positive and progesterone receptor, 100% positive or negative, both with strong staining intensity.   The patient's subsequent history is as detailed below.   PAST MEDICAL HISTORY: Past Medical History:  Diagnosis Date  . Arthritis   . Breast cancer, right breast [facility])    pending right breast lumpectomy on September 01, 2018  . Diabetes mellitus type II, controlled ([place])    On Kombiglyze  . Family history of ovarian cancer   . Family history of thyroid cancer   . GERD (gastroesophageal reflux disease)    rarely occurs- no meds  . Hyperlipidemia associated with type 2 diabetes mellitus ([place])   . Hypertension   . Hypothyroidism   . Personal history of radiation therapy   . Primary localized osteoarthritis of right knee 04/17/2019  . S/P knee replacement 04/17/2019  . Trigeminal neuralgia of left side of face 2015   s/p Gamma knife  She has a stable lung nodule   PAST SURGICAL HISTORY: Past Surgical History:  Procedure Laterality Date  . BREAST BIOPSY Right 07/24/2018  . BREAST LUMPECTOMY Right 09/01/2018  . BREAST LUMPECTOMY WITH RADIOACTIVE SEED LOCALIZATION Right  09/01/2018   Procedure: RIGHT BREAST LUMPECTOMY WITH RADIOACTIVE SEED LOCALIZATION;  Surgeon: [name], MD;  Location: [facility];  Service: General;   Laterality: Right;  . COLONOSCOPY    . Gamma Knife Left 04/08/2016  . HYSTEROSCOPY WITH D & C  05/24/2011   Procedure: DILATATION AND CURETTAGE (D&C) /HYSTEROSCOPY;  Surgeon: [name], MD;  Location: [facility];  Service: Gynecology;  Laterality: N/A;  . KNEE SURGERY Right 06/10/15   torn ligament  . PARTIAL KNEE ARTHROPLASTY Right 04/17/2019   Procedure: UNICOMPARTMENTAL KNEE;  Surgeon: [name], MD;  Location: [facility];  Service: Orthopedics;  Laterality: Right;  . THYROIDECTOMY N/A 09/08/2016   Procedure: TOTAL THYROIDECTOMY;  Surgeon: [name], MD;  Location: [facility];  Service: ENT;  Laterality: N/A;  . TUBAL LIGATION    . uterine polyp    . WISDOM TOOTH EXTRACTION Left 2014    FAMILY HISTORY Family History  Problem Relation Age of Onset  . Hypertension Mother   . Kidney failure Mother   . Congestive Heart Failure Mother   . Heart Problems Father   . Stroke Father   . Pneumonia Father   . Heart Problems Sister 40       heart transplant  . Stroke Paternal Grandmother   . Dementia Paternal Grandmother   . Stroke Paternal Grandfather   . Cardiomyopathy Sister   . Ovarian cancer Sister 38       d. 71  . Thyroid disease Sister   . Thyroid disease Sister   . Thyroid cancer Niece 32  . Breast cancer Neg Hx   (as of 08/02/2018) Patient father was 41 years old when he died from pneumonia and a stroke. Patient mother died from possible heart attack at age 8.  The patient denies a family hx of breast cancer. A sister was diagnosed with ovarian cancer, and has since passed away at age 71.  The patient has 5 siblings, 5 sisters and 0 brothers.   GYNECOLOGIC HISTORY:  No LMP recorded. Patient is postmenopausal. Menarche: 69 years old Age at first live birth: 69 years old [place] P 4 LMP 1996 Contraceptive yes HRT no  Hysterectomy? Yes, 5-6 years ago (per patient) BSO? 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 Husband [name] is a retired  English as a second language teacher. [name], age 3, is a NP for an opioid clinic in [place]. Son [name], age 63, is a Nature conservation officer in [place]. Daughter [name], age 72, works as a Software engineer for [employer]. Son [name], age 10, works in Land in [place].  The patient has no grandchildren however she has a foster child who is currently 66.  The patient belongs to [employer] DIRECTIVES: Husband [name] is her HCPOA.   HEALTH MAINTENANCE: Social History   Tobacco Use  . Smoking status: Never Smoker  . Smokeless tobacco: Never Used  Vaping Use  . Vaping Use: Never used  Substance Use Topics  . Alcohol use: No  . Drug use: No     Colonoscopy: 2019, Dr. Collene Mares  PAP: 03/21/2011, normal  Bone density: Pending   Allergies  Allergen Reactions  . Cyclobenzaprine Nausea Only    Current Outpatient Medications  Medication Sig Dispense Refill  . Accu-Chek FastClix Lancets MISC Use as instructed to check blood sugars 1 time per day dx: e11.22 150 each 2  . anastrozole (ARIMIDEX) 1 MG tablet Take 1 tablet (1 mg total) by  mouth daily. Start Apr 29, 2019 90 tablet 4  . aspirin EC 325 MG tablet Take 1 tablet (325 mg total) by mouth 2 (two) times daily. 60 tablet 0  . baclofen (LIORESAL) 10 MG tablet Take 10 mg by mouth 3 (three) times daily.     . Biotin (BIOTIN MAXIMUM) 10000 MCG TBDP Take 10,000 mcg by mouth daily.     . Blood Glucose Monitoring Suppl (ACCU-CHEK GUIDE) w/Device KIT 1 each by Does not apply route daily. Dx: e11.22 1 kit 1  . cetirizine (ZYRTEC) 10 MG tablet Take 1 tablet (10 mg total) by mouth daily. 30 tablet 3  . Cholecalciferol (VITAMIN D) 125 MCG (5000 UT) CAPS Take 5,000 Units by mouth daily.    . COMBIGAN 0.2-0.5 % ophthalmic solution Place 1 drop into both eyes 2 (two) times daily.    . DULoxetine (CYMBALTA) 60 MG capsule Take 1 capsule (60 mg total) by mouth daily. 90 capsule 4  . gabapentin (NEURONTIN) 300 MG capsule Take 3 capsules (900 mg total) by mouth 3  (three) times daily. 810 capsule 4  . glucose blood (ACCU-CHEK GUIDE) test strip Use as instructed to check blood sugars 1 time per day dx: e11.22 150 each 2  . HYDROcodone-acetaminophen (NORCO) 10-325 MG tablet Take 1 tablet by mouth every 6 (six) hours as needed. 28 tablet 0  . KOMBIGLYZE XR 5-500 MG TB24 TAKE 1 TABLET BY MOUTH DAILY BEFORE SUPPER. 90 tablet 1  . latanoprost (XALATAN) 0.005 % ophthalmic solution Place 1 drop into both eyes at bedtime.    Marland Kitchen losartan-hydrochlorothiazide (HYZAAR) 50-12.5 MG tablet TAKE 1 TABLET BY MOUTH EVERY DAY 90 tablet 1  . meloxicam (MOBIC) 15 MG tablet Take 15 mg by mouth daily.    . ondansetron (ZOFRAN) 4 MG tablet Take 1 tablet (4 mg total) by mouth every 8 (eight) hours as needed for nausea or vomiting. 10 tablet 0  . pravastatin (PRAVACHOL) 40 MG tablet TAKE 1 TABLET BY MOUTH EVERY DAY 90 tablet 2  . SYNTHROID 75 MCG tablet TAKE 75 MCG BY MOUTH DAILY MONDAY - SATURDAY AND HOLD ON SUNDAY 90 tablet 1  . topiramate (TOPAMAX) 50 MG tablet Take 100 mg by mouth 2 (two) times daily.      No current facility-administered medications for this visit.    OBJECTIVE: African-American woman in no acute distress  Vitals:   12/10/19 1005  BP: 131/71  Pulse: 88  Resp: 17  Temp: 98.5 F (36.9 C)  SpO2: 98%   Wt Readings from Last 3 Encounters:  12/10/19 176 lb 1.6 oz (79.9 kg)  11/19/19 176 lb (79.8 kg)  10/01/19 181 lb (82.1 kg)   Body mass index is 28.86 kg/m.    Sclerae unicteric, EOMs intact Wearing a mask No cervical or supraclavicular adenopathy Lungs no rales or rhonchi Heart regular rate and rhythm Abd soft, nontender, positive bowel sounds MSK no focal spinal tenderness, no upper extremity lymphedema Neuro: nonfocal, well oriented, appropriate affect Breasts:    LAB RESULTS:  CMP     Component Value Date/Time   NA 143 10/01/2019 0930   K 4.2 10/01/2019 0930   CL 105 10/01/2019 0930   CO2 28 10/01/2019 0930   GLUCOSE 105 (H)  10/01/2019 0930   GLUCOSE 138 (H) 04/18/2019 0300   BUN 14 10/01/2019 0930   CREATININE 0.58 10/01/2019 0930   CREATININE 0.77 08/02/2018 0835   CALCIUM 9.6 10/01/2019 0930   PROT 6.9 06/05/2019 1524   ALBUMIN 4.4  06/05/2019 1524   AST 21 06/05/2019 1524   AST 18 08/02/2018 0835   ALT 18 06/05/2019 1524   ALT 22 08/02/2018 0835   ALKPHOS 107 06/05/2019 1524   BILITOT 0.5 06/05/2019 1524   BILITOT 0.7 08/02/2018 0835   GFRNONAA 94 10/01/2019 0930   GFRNONAA >60 08/02/2018 0835   GFRAA 108 10/01/2019 0930   GFRAA >60 08/02/2018 0835    No results found for: TOTALPROTELP, ALBUMINELP, A1GS, A2GS, BETS, BETA2SER, GAMS, MSPIKE, SPEI  No results found for: KPAFRELGTCHN, LAMBDASER, KAPLAMBRATIO  Lab Results  Component Value Date   WBC 8.2 04/18/2019   NEUTROABS 2.8 08/02/2018   HGB 10.7 (L) 04/18/2019   HCT 34.3 (L) 04/18/2019   MCV 87.9 04/18/2019   PLT 218 04/18/2019   No results found for: LABCA2  No components found for: FIEPPI951  No results for input(s): INR in the last 168 hours.  No results found for: LABCA2  No results found for: OAC166  No results found for: AYT016  No results found for: WFU932  No results found for: CA2729  No components found for: HGQUANT  No results found for: CEA1 / No results found for: CEA1   No results found for: AFPTUMOR  No results found for: CHROMOGRNA  No results found for: HGBA, HGBA2QUANT, HGBFQUANT, HGBSQUAN (Hemoglobinopathy evaluation)   No results found for: LDH  No results found for: IRON, TIBC, IRONPCTSAT (Iron and TIBC)  No results found for: FERRITIN  Urinalysis    Component Value Date/Time   COLORURINE YELLOW 08/17/2007 1404   APPEARANCEUR CLEAR 08/17/2007 1404   LABSPEC 1.010 08/17/2007 1404   PHURINE 7.0 08/17/2007 1404   GLUCOSEU NEGATIVE 08/17/2007 1404   HGBUR NEGATIVE 08/17/2007 1404   BILIRUBINUR negative 03/08/2019 1234   KETONESUR negative 04/12/2016 [place] 08/17/2007  1404   PROTEINUR Negative 03/08/2019 1234   PROTEINUR NEGATIVE 08/17/2007 1404   UROBILINOGEN 0.2 03/08/2019 1234   UROBILINOGEN 0.2 08/17/2007 1404   NITRITE negative 03/08/2019 1234   NITRITE NEGATIVE 08/17/2007 1404   LEUKOCYTESUR Negative 03/08/2019 1234    STUDIES: No results found.   ELIGIBLE FOR AVAILABLE RESEARCH PROTOCOL: no  ASSESSMENT: 69 y.o. [place] woman status post right breast biopsy 07/24/2018 for ductal carcinoma in situ, grade 1, strongly estrogen and progesterone receptor positive.  (1) status post right lumpectomy 09/01/2018 for ductal carcinoma in situ, 5 mm, with negative margins  (2) adjuvant radiation 10/16/2018 - 11/10/2018  (a) Right Breast was treated to 42.56 Gy in 16 fractions, followed by a boost of 8 Gy in 4 fractions.  (3) started anastrozole 04/29/2019  (a) bone density 0124 2022  (4) genetics testing on 08/21/2018  (a) genetic testing 08/27/2018 on the STAT panel and the common hereditary cancer gene paneloffered by [facility] found no deleterious mutations in APC, ATM, AXIN2, BARD1, BMPR1A, BRCA1, BRCA2, BRIP1, CDH1, CDK4, CDKN2A (p14ARF), CDKN2A (p16INK4a), CHEK2, CTNNA1, DICER1, EPCAM (Deletion/duplication testing only), GREM1 (promoter region deletion/duplication testing only), KIT, MEN1, MLH1, MSH2, MSH3, MSH6, MUTYH, NBN, NF1, NHTL1, PALB2, PDGFRA, PMS2, POLD1, POLE, PTEN, RAD50, RAD51C, RAD51D, SDHB, SDHC, SDHD, SMAD4, SMARCA4. STK11, TP53, TSC1, TSC2, and VHL.  The following genes were evaluated for sequence changes only: SDHA and HOXB13 c.251G>A variant only.    PLAN: Christina Hernandez is tolerating anastrozole well and the plan will be to continue that a total of 5 years.  We discussed the fact that we do not do lab work in patients with noninvasive breast cancer and so her lab work  will be done as before through her primary care physician.  She does not have a recent bone density.  I have written for her to have one done next January with her next  mammogram at the breast center  Otherwise she will see me again in 1 year.  She knows to call for any other issue that may develop before then  Total encounter time 25 minutes.*  Orey Moure, [name], MD  12/10/19 10:16 AM Medical Oncology and Hematology [facility] [place], [place] [address] Tel. [phone]    Fax. [phone]   I, Wilburn Mylar, am acting as scribe for Dr. [name]. Winslow Ederer.  I, Lurline Del MD, have reviewed the above documentation for accuracy and completeness, and I agree with the above.   *Total Encounter Time as defined by the Centers for Medicare and Medicaid Services includes, in addition to the face-to-face time of a patient visit (documented in the note above) non-face-to-face time: obtaining and reviewing outside history, ordering and reviewing medications, tests or procedures, care coordination (communications with other health care professionals or caregivers) and documentation in the medical record.

## 2019-12-10 ENCOUNTER — Inpatient Hospital Stay: Payer: Medicare PPO | Attending: Oncology | Admitting: Oncology

## 2019-12-10 ENCOUNTER — Other Ambulatory Visit: Payer: Self-pay

## 2019-12-10 VITALS — BP 131/71 | HR 88 | Temp 98.5°F | Resp 17 | Ht 65.5 in | Wt 176.1 lb

## 2019-12-10 DIAGNOSIS — K219 Gastro-esophageal reflux disease without esophagitis: Secondary | ICD-10-CM | POA: Diagnosis not present

## 2019-12-10 DIAGNOSIS — Z79811 Long term (current) use of aromatase inhibitors: Secondary | ICD-10-CM | POA: Diagnosis not present

## 2019-12-10 DIAGNOSIS — Z791 Long term (current) use of non-steroidal anti-inflammatories (NSAID): Secondary | ICD-10-CM | POA: Diagnosis not present

## 2019-12-10 DIAGNOSIS — I10 Essential (primary) hypertension: Secondary | ICD-10-CM | POA: Diagnosis not present

## 2019-12-10 DIAGNOSIS — E119 Type 2 diabetes mellitus without complications: Secondary | ICD-10-CM | POA: Diagnosis not present

## 2019-12-10 DIAGNOSIS — E785 Hyperlipidemia, unspecified: Secondary | ICD-10-CM | POA: Diagnosis not present

## 2019-12-10 DIAGNOSIS — C50911 Malignant neoplasm of unspecified site of right female breast: Secondary | ICD-10-CM | POA: Insufficient documentation

## 2019-12-10 DIAGNOSIS — Z8041 Family history of malignant neoplasm of ovary: Secondary | ICD-10-CM | POA: Diagnosis not present

## 2019-12-10 DIAGNOSIS — E1165 Type 2 diabetes mellitus with hyperglycemia: Secondary | ICD-10-CM

## 2019-12-10 DIAGNOSIS — Z170 Estrogen receptor positive status [ER+]: Secondary | ICD-10-CM | POA: Insufficient documentation

## 2019-12-10 DIAGNOSIS — E039 Hypothyroidism, unspecified: Secondary | ICD-10-CM | POA: Insufficient documentation

## 2019-12-10 DIAGNOSIS — E7800 Pure hypercholesterolemia, unspecified: Secondary | ICD-10-CM | POA: Diagnosis not present

## 2019-12-10 DIAGNOSIS — Z79899 Other long term (current) drug therapy: Secondary | ICD-10-CM | POA: Diagnosis not present

## 2019-12-10 DIAGNOSIS — D0511 Intraductal carcinoma in situ of right breast: Secondary | ICD-10-CM | POA: Diagnosis not present

## 2019-12-10 DIAGNOSIS — Z7982 Long term (current) use of aspirin: Secondary | ICD-10-CM | POA: Diagnosis not present

## 2019-12-10 DIAGNOSIS — Z8249 Family history of ischemic heart disease and other diseases of the circulatory system: Secondary | ICD-10-CM | POA: Insufficient documentation

## 2019-12-10 DIAGNOSIS — Z8349 Family history of other endocrine, nutritional and metabolic diseases: Secondary | ICD-10-CM | POA: Diagnosis not present

## 2019-12-11 ENCOUNTER — Telehealth: Payer: Self-pay | Admitting: Oncology

## 2019-12-11 NOTE — Telephone Encounter (Signed)
No 5/14 los. No changes made to pt's schedule.

## 2019-12-24 DIAGNOSIS — G500 Trigeminal neuralgia: Secondary | ICD-10-CM | POA: Diagnosis not present

## 2020-01-07 ENCOUNTER — Other Ambulatory Visit: Payer: Self-pay | Admitting: Internal Medicine

## 2020-01-07 DIAGNOSIS — E1165 Type 2 diabetes mellitus with hyperglycemia: Secondary | ICD-10-CM

## 2020-01-23 ENCOUNTER — Institutional Professional Consult (permissible substitution): Payer: Medicare PPO | Admitting: Pulmonary Disease

## 2020-01-28 ENCOUNTER — Encounter: Payer: Self-pay | Admitting: Pulmonary Disease

## 2020-01-28 ENCOUNTER — Ambulatory Visit: Payer: Medicare PPO | Admitting: Pulmonary Disease

## 2020-01-28 ENCOUNTER — Other Ambulatory Visit: Payer: Self-pay

## 2020-01-28 VITALS — BP 126/82 | HR 66 | Temp 97.8°F | Ht 65.0 in | Wt 175.8 lb

## 2020-01-28 DIAGNOSIS — R911 Solitary pulmonary nodule: Secondary | ICD-10-CM

## 2020-01-28 NOTE — Patient Instructions (Signed)
We will schedule you for CT chest without contrast for follow-up of lung nodule Based on the results we will decide if you need further follow-up.

## 2020-01-28 NOTE — Progress Notes (Signed)
Christina Hernandez    [id number]    11-Dec-1950  Primary Care Physician:Sanders, [name], MD  Referring Physician: Glendale Chard, [address] [place],  [place] [address]  Chief complaint: Follow-up for lung nodule  HPI: 69 year old with medical history of thyroid goiter, diabetes, GERD, hypertension. She underwent total thyroidectomy on 3/15. She had a CT neck in preparation for this in January which showed a right upper lobe lung nodule. A follow-up CT of the chest did demonstrate that the right upper lobe lung nodule she has been referred here for further evaluation next and she is a lifelong nonsmoker but has been exposed to secondhand cigarette smoke. She has history of any sedatives, allergies, postnasal drip. 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 She denies any cough, sputum production, dyspnea, wheezing. Her surgery incision is clean and dry with no discharge. She does not report any neck swelling, tenderness, erythema. She's had chills at home but no reported fevers.  Interim history: Last seen in clinic in 2018.  She was being followed up for lung nodules which have remained stable on CT scan Has developed breast cancer in the interim treated with lumpectomy, radiation and hormonal therapy.  Outpatient Encounter Medications as of 01/28/2020  Medication Sig  . Accu-Chek FastClix Lancets MISC Use as instructed to check blood sugars 1 time per day dx: e11.22  . anastrozole (ARIMIDEX) 1 MG tablet Take 1 tablet (1 mg total) by mouth daily. Start Apr 29, 2019  . baclofen (LIORESAL) 10 MG tablet Take 10 mg by mouth 3 (three) times daily.   . Blood Glucose Monitoring Suppl (ACCU-CHEK GUIDE) w/Device KIT 1 each by Does not apply route daily. Dx: e11.22  . cetirizine (ZYRTEC) 10 MG tablet Take 1 tablet (10 mg total) by mouth daily.  .  Cholecalciferol (VITAMIN D) 125 MCG (5000 UT) CAPS Take 5,000 Units by mouth daily.  . COMBIGAN 0.2-0.5 % ophthalmic solution Place 1 drop into both eyes 2 (two) times daily.  . DULoxetine (CYMBALTA) 60 MG capsule Take 1 capsule (60 mg total) by mouth daily.  Marland Kitchen gabapentin (NEURONTIN) 300 MG capsule Take 3 capsules (900 mg total) by mouth 3 (three) times daily.  Marland Kitchen glucose blood (ACCU-CHEK GUIDE) test strip Use as instructed to check blood sugars 1 time per day dx: e11.22  . KOMBIGLYZE XR 5-500 MG TB24 TAKE 1 TABLET BY MOUTH DAILY BEFORE SUPPER.  Marland Kitchen latanoprost (XALATAN) 0.005 % ophthalmic solution Place 1 drop into both eyes at bedtime.  Marland Kitchen losartan-hydrochlorothiazide (HYZAAR) 50-12.5 MG tablet TAKE 1 TABLET BY MOUTH EVERY DAY  . ondansetron (ZOFRAN) 4 MG tablet Take 1 tablet (4 mg total) by mouth every 8 (eight) hours as needed for nausea or vomiting.  . pravastatin (PRAVACHOL) 40 MG tablet TAKE 1 TABLET BY MOUTH EVERY DAY  . SYNTHROID 75 MCG tablet TAKE 75 MCG BY MOUTH DAILY MONDAY - SATURDAY AND HOLD ON SUNDAY  . [DISCONTINUED] aspirin EC 325 MG tablet Take 1 tablet (325 mg total) by mouth 2 (two) times daily.  . [DISCONTINUED] Biotin (BIOTIN MAXIMUM) 10000 MCG TBDP Take 10,000 mcg by mouth daily.   . [DISCONTINUED] meloxicam (MOBIC) 15 MG tablet Take 15 mg by mouth daily.  . [DISCONTINUED] topiramate (TOPAMAX) 50 MG tablet Take 100 mg by mouth 2 (two) times daily.   . [DISCONTINUED] HYDROcodone-acetaminophen (NORCO) 10-325 MG  tablet Take 1 tablet by mouth every 6 (six) hours as needed.   No facility-administered encounter medications on file as of 01/28/2020.    Physical Exam: Blood pressure 126/82, pulse 66, temperature 97.8 F (36.6 C), temperature source Oral, height '5\' 5"'  (1.651 m), weight 175 lb 12.8 oz (79.7 kg), SpO2 98 %. Gen:      No acute distress HEENT:  EOMI, sclera anicteric Neck:     No masses; no thyromegaly Lungs:    Clear to auscultation bilaterally; normal respiratory  effort CV:         Regular rate and rhythm; no murmurs Abd:      + bowel sounds; soft, non-tender; no palpable masses, no distension Ext:    No edema; adequate peripheral perfusion Skin:      Warm and dry; no rash Neuro: alert and oriented x 3 Psych: normal mood and affect  Data Reviewed: CT neck 07/26/16-right upper lobe nodule 7 x 8 mm in size CT scan chest 08/05/16-6 millimeter right upper lobe nodule, mild bibasilar atelectasis. CT chest 05/25/2017-stable 6 mm nodule CT chest 01/24/2019-stable 6 mm nodule. I have reviewed all images personally.  Assessment:  RUL nodule.  Although she is a nonsmoker she has been exposed to secondhand smoke. She does not have any other exposures.  Nodule is likely benign as it has remained stable from 2018-2020 and she is a non-smoker However there is concern from the patient as she developed breast cancer in 2020.  We will repeat another scan this year.  If there is continued stability then no more follow-up needed  Plan/Recommendations: - Follow up CT scan chest  [name] MD [facility] 01/28/2020, 2:12 PM  CC: Glendale Chard, MD

## 2020-01-28 NOTE — Progress Notes (Signed)
Christina Hernandez    [id number]    07/19/1950  Primary Care Physician:Sanders, [name], MD  Referring Physician: Glendale Chard, MD [address] [place],  [place] [address]  Chief complaint: Consult for evaluation of lung nodule  HPI: 69 year old with medical history of thyroid goiter, diabetes, GERD, hypertension. She underwent total thyroidectomy on 3/15. She had a CT neck in preparation for this in January which showed a right upper lobe lung nodule. A follow-up CT of the chest did demonstrate that the right upper lobe lung nodule she has been referred here for further evaluation next and she is a lifelong nonsmoker but has been exposed to secondhand cigarette smoke. She has history of any sedatives, allergies, postnasal drip. 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 She denies any cough, sputum production, dyspnea, wheezing. Her surgery incision is clean and dry with no discharge. She does not report any neck swelling, tenderness, erythema. She's had chills at home but no reported fevers.  Outpatient Encounter Medications as of 01/28/2020  Medication Sig  . Accu-Chek FastClix Lancets MISC Use as instructed to check blood sugars 1 time per day dx: e11.22  . anastrozole (ARIMIDEX) 1 MG tablet Take 1 tablet (1 mg total) by mouth daily. Start Apr 29, 2019  . baclofen (LIORESAL) 10 MG tablet Take 10 mg by mouth 3 (three) times daily.   . Blood Glucose Monitoring Suppl (ACCU-CHEK GUIDE) w/Device KIT 1 each by Does not apply route daily. Dx: e11.22  . cetirizine (ZYRTEC) 10 MG tablet Take 1 tablet (10 mg total) by mouth daily.  . Cholecalciferol (VITAMIN D) 125 MCG (5000 UT) CAPS Take 5,000 Units by mouth daily.  . COMBIGAN 0.2-0.5 % ophthalmic solution Place 1 drop into both eyes 2 (two) times daily.  . DULoxetine (CYMBALTA) 60 MG capsule Take 1 capsule  (60 mg total) by mouth daily.  Marland Kitchen gabapentin (NEURONTIN) 300 MG capsule Take 3 capsules (900 mg total) by mouth 3 (three) times daily.  Marland Kitchen glucose blood (ACCU-CHEK GUIDE) test strip Use as instructed to check blood sugars 1 time per day dx: e11.22  . KOMBIGLYZE XR 5-500 MG TB24 TAKE 1 TABLET BY MOUTH DAILY BEFORE SUPPER.  Marland Kitchen latanoprost (XALATAN) 0.005 % ophthalmic solution Place 1 drop into both eyes at bedtime.  Marland Kitchen losartan-hydrochlorothiazide (HYZAAR) 50-12.5 MG tablet TAKE 1 TABLET BY MOUTH EVERY DAY  . ondansetron (ZOFRAN) 4 MG tablet Take 1 tablet (4 mg total) by mouth every 8 (eight) hours as needed for nausea or vomiting.  . pravastatin (PRAVACHOL) 40 MG tablet TAKE 1 TABLET BY MOUTH EVERY DAY  . SYNTHROID 75 MCG tablet TAKE 75 MCG BY MOUTH DAILY MONDAY - SATURDAY AND HOLD ON SUNDAY  . [DISCONTINUED] aspirin EC 325 MG tablet Take 1 tablet (325 mg total) by mouth 2 (two) times daily.  . [DISCONTINUED] Biotin (BIOTIN MAXIMUM) 10000 MCG TBDP Take 10,000 mcg by mouth daily.   . [DISCONTINUED] meloxicam (MOBIC) 15 MG tablet Take 15 mg by mouth daily.  . [DISCONTINUED] topiramate (TOPAMAX) 50 MG tablet Take 100 mg by mouth 2 (two) times daily.   . [DISCONTINUED] HYDROcodone-acetaminophen (NORCO) 10-325 MG tablet Take 1 tablet by mouth every 6 (six) hours as needed.   No facility-administered encounter medications on file as of 01/28/2020.    Allergies as of 01/28/2020 - Review Complete 01/28/2020  Allergen Reaction  Noted  . Cyclobenzaprine Nausea Only 12/17/2015    Past Medical History:  Diagnosis Date  . Arthritis   . Breast cancer, right breast [facility])    pending right breast lumpectomy on September 01, 2018  . Diabetes mellitus type II, controlled ([facility])    On Kombiglyze  . Family history of ovarian cancer   . Family history of thyroid cancer   . GERD (gastroesophageal reflux disease)    rarely occurs- no meds  . Hyperlipidemia associated with type 2 diabetes mellitus ([place])   . Hypertension    . Hypothyroidism   . Personal history of radiation therapy   . Primary localized osteoarthritis of right knee 04/17/2019  . S/P knee replacement 04/17/2019  . Trigeminal neuralgia of left side of face 2015   s/p Gamma knife    Past Surgical History:  Procedure Laterality Date  . BREAST BIOPSY Right 07/24/2018  . BREAST LUMPECTOMY Right 09/01/2018  . BREAST LUMPECTOMY WITH RADIOACTIVE SEED LOCALIZATION Right 09/01/2018   Procedure: RIGHT BREAST LUMPECTOMY WITH RADIOACTIVE SEED LOCALIZATION;  Surgeon: [name], MD;  Location: [facility];  Service: General;  Laterality: Right;  . COLONOSCOPY    . Gamma Knife Left 04/08/2016  . HYSTEROSCOPY WITH D & C  05/24/2011   Procedure: DILATATION AND CURETTAGE (D&C) /HYSTEROSCOPY;  Surgeon: [name], MD;  Location: [facility];  Service: Gynecology;  Laterality: N/A;  . KNEE SURGERY Right 06/10/15   torn ligament  . PARTIAL KNEE ARTHROPLASTY Right 04/17/2019   Procedure: UNICOMPARTMENTAL KNEE;  Surgeon: [name], MD;  Location: [facility];  Service: Orthopedics;  Laterality: Right;  . THYROIDECTOMY N/A 09/08/2016   Procedure: TOTAL THYROIDECTOMY;  Surgeon: [name], MD;  Location: [facility];  Service: ENT;  Laterality: N/A;  . TUBAL LIGATION    . uterine polyp    . WISDOM TOOTH EXTRACTION Left 2014    Family History  Problem Relation Age of Onset  . Hypertension Mother   . Kidney failure Mother   . Congestive Heart Failure Mother   . Heart Problems Father   . Stroke Father   . Pneumonia Father   . Heart Problems Sister 40       heart transplant  . Stroke Paternal Grandmother   . Dementia Paternal Grandmother   . Stroke Paternal Grandfather   . Cardiomyopathy Sister   . Ovarian cancer Sister 61       d. 91  . Thyroid disease Sister   . Thyroid disease Sister   . Thyroid cancer Niece 59  . Breast cancer Neg Hx     Social History   Socioeconomic History  . Marital status: Married    Spouse name: [name]  . Number  of children: 4  . Years of education: BS  . Highest education level: Not on file  Occupational History  . Occupation: Retired    Fish farm manager: OTHER  Tobacco Use  . Smoking status: Never Smoker  . Smokeless tobacco: Never Used  Vaping Use  . Vaping Use: Never used  Substance and Sexual Activity  . Alcohol use: No  . Drug use: No  . Sexual activity: Not Currently  Other Topics Concern  . Not on file  Social History Narrative   Patient lives at home with family.   Caffeine Use: occasionally      Lives at home with husband and son.  Is a retired Psychologist, prison and probation services Strain:   . Difficulty of Paying  Living Expenses:   Food Insecurity:   . Worried About Charity fundraiser in the Last Year:   . Arboriculturist in the Last Year:   Transportation Needs:   . Film/video editor (Medical):   Marland Kitchen Lack of Transportation (Non-Medical):   Physical Activity:   . Days of Exercise per Week:   . Minutes of Exercise per Session:   Stress:   . Feeling of Stress :   Social Connections:   . Frequency of Communication with Friends and Family:   . Frequency of Social Gatherings with Friends and Family:   . Attends Religious Services:   . Active Member of Clubs or Organizations:   . Attends Archivist Meetings:   Marland Kitchen Marital Status:   Intimate Partner Violence:   . Fear of Current or Ex-Partner:   . Emotionally Abused:   Marland Kitchen Physically Abused:   . Sexually Abused:    Review of systems: Review of Systems  Constitutional: Negative for fever and chills.  HENT: Negative.   Eyes: Negative for blurred vision.  Respiratory: as per HPI  Cardiovascular: Negative for chest pain and palpitations.  Gastrointestinal: Negative for vomiting, diarrhea, blood per rectum. Genitourinary: Negative for dysuria, urgency, frequency and hematuria.  Musculoskeletal: Negative for myalgias, back pain and joint pain.  Skin: Negative for itching and rash.   Neurological: Negative for dizziness, tremors, focal weakness, seizures and loss of consciousness.  Endo/Heme/Allergies: Negative for environmental allergies.  Psychiatric/Behavioral: Negative for depression, suicidal ideas and hallucinations.  All other systems reviewed and are negative.  Physical Exam: Blood pressure 132/72, pulse (!) 107, height '5\' 5"'  (1.651 m), weight 167 lb 9.6 oz (76 kg), SpO2 99 %, T 97.9 Gen:      No acute distress HEENT:  EOMI, sclera anicteric Neck:     No masses; no thyromegaly Lungs:    Clear to auscultation bilaterally; normal respiratory effort CV:         Regular rate and rhythm; no murmurs Abd:      + bowel sounds; soft, non-tender; no palpable masses, no distension Ext:    No edema; adequate peripheral perfusion Skin:      Warm and dry; no rash Neuro: alert and oriented x 3 Psych: normal mood and affect  Data Reviewed: CT neck 07/26/16-right upper lobe nodule 7 x 8 mm in size CT scan chest 2/8/1 8-6 millimeter right upper lobe nodule, mild bibasilar atelectasis. I have reviewed all images personally.  Assessment:  RUL nodule.  Although she is a nonsmoker she has been exposed to secondhand smoke. She does not have any other exposures. We will reevaluate by getting a follow-up CT scan of the chest later this year.  Acute sinusitis, rhinitis She will use OTC antihistamines, Flonase spray and saline nasal spray. The site of thyroidectomy does not appear infected. I have asked her to seek care in emergency room if symptoms worsen or if she develops fevers.   Plan/Recommendations: - Follow up CT scan chest  [name] MD [facility] Pager [phone] 01/28/2020, 2:04 PM  CC: Glendale Chard, MD

## 2020-02-04 ENCOUNTER — Other Ambulatory Visit: Payer: Self-pay

## 2020-02-04 ENCOUNTER — Ambulatory Visit (INDEPENDENT_AMBULATORY_CARE_PROVIDER_SITE_OTHER)
Admission: RE | Admit: 2020-02-04 | Discharge: 2020-02-04 | Disposition: A | Discharge status: Home / Self Care | Payer: Medicare PPO | Source: Ambulatory Visit | Attending: Pulmonary Disease | Admitting: Pulmonary Disease

## 2020-02-04 DIAGNOSIS — R911 Solitary pulmonary nodule: Secondary | ICD-10-CM

## 2020-02-04 DIAGNOSIS — R918 Other nonspecific abnormal finding of lung field: Secondary | ICD-10-CM | POA: Diagnosis not present

## 2020-02-19 ENCOUNTER — Other Ambulatory Visit: Payer: Self-pay | Admitting: Internal Medicine

## 2020-03-12 ENCOUNTER — Ambulatory Visit: Payer: Medicare PPO | Admitting: Internal Medicine

## 2020-03-12 ENCOUNTER — Other Ambulatory Visit: Payer: Self-pay

## 2020-03-12 ENCOUNTER — Encounter: Payer: Self-pay | Admitting: Internal Medicine

## 2020-03-12 ENCOUNTER — Ambulatory Visit (INDEPENDENT_AMBULATORY_CARE_PROVIDER_SITE_OTHER): Payer: Medicare PPO

## 2020-03-12 VITALS — BP 138/76 | HR 94 | Temp 97.8°F | Ht 64.8 in | Wt 174.0 lb

## 2020-03-12 DIAGNOSIS — C50011 Malignant neoplasm of nipple and areola, right female breast: Secondary | ICD-10-CM | POA: Insufficient documentation

## 2020-03-12 DIAGNOSIS — I700 Atherosclerosis of aorta: Secondary | ICD-10-CM | POA: Diagnosis not present

## 2020-03-12 DIAGNOSIS — H6123 Impacted cerumen, bilateral: Secondary | ICD-10-CM | POA: Diagnosis not present

## 2020-03-12 DIAGNOSIS — Z0000 Encounter for general adult medical examination without abnormal findings: Secondary | ICD-10-CM

## 2020-03-12 DIAGNOSIS — E1165 Type 2 diabetes mellitus with hyperglycemia: Secondary | ICD-10-CM

## 2020-03-12 DIAGNOSIS — I10 Essential (primary) hypertension: Secondary | ICD-10-CM | POA: Diagnosis not present

## 2020-03-12 DIAGNOSIS — E663 Overweight: Secondary | ICD-10-CM | POA: Diagnosis not present

## 2020-03-12 DIAGNOSIS — Z23 Encounter for immunization: Secondary | ICD-10-CM

## 2020-03-12 LAB — POCT URINALYSIS DIPSTICK
Bilirubin, UA: NEGATIVE
Glucose, UA: NEGATIVE
Ketones, UA: NEGATIVE
Leukocytes, UA: NEGATIVE
Nitrite, UA: NEGATIVE
Protein, UA: NEGATIVE
Spec Grav, UA: 1.02 (ref 1.010–1.025)
Urobilinogen, UA: 0.2 E.U./dL
pH, UA: 6.5 (ref 5.0–8.0)

## 2020-03-12 LAB — POCT UA - MICROALBUMIN
Albumin/Creatinine Ratio, Urine, POC: <30
Creatinine, POC: 200 mg/dL
Microalbumin Ur, POC: 10 mg/L

## 2020-03-12 MED ORDER — PREVNAR 13 IM SUSP: 0.5000 mL | INTRAMUSCULAR | 0 refills | Status: AC

## 2020-03-12 MED ORDER — PRAVASTATIN SODIUM 80 MG PO TABS: ORAL_TABLET | ORAL | 2 refills | Status: DC

## 2020-03-12 NOTE — Progress Notes (Signed)
This visit occurred during the SARS-CoV-2 public health emergency.  Safety protocols were in place, including screening questions prior to the visit, additional usage of staff PPE, and extensive cleaning of exam room while observing appropriate contact time as indicated for disinfecting solutions.  Subjective:   Christina Hernandez is a 69 y.o. female who presents for Medicare Annual (Subsequent) preventive examination.  Review of Systems     Cardiac Risk Factors include: advanced age (>67mn, >>66women);diabetes mellitus;hypertension;sedentary lifestyle     Objective:    Today's Vitals   03/12/20 1127 03/12/20 1133  BP: 138/76   Pulse: 94   Temp: 97.8 F (36.6 C)   TempSrc: Oral   SpO2: 95%   Weight: 174 lb (78.9 kg)   Height: 5' 4.8" (1.646 m)   PainSc:  6    Body mass index is 29.13 kg/m.  Advanced Directives 03/12/2020 04/17/2019 04/10/2019 03/08/2019 08/28/2018 05/10/2018 09/01/2016  Does Patient Have a Medical Advance Directive? No No No No No No No  Would patient like information on creating a medical advance directive? Yes (MAU/Ambulatory/Procedural Areas - Information given) No - Patient declined No - Patient declined - No - Patient declined Yes (MAU/Ambulatory/Procedural Areas - Information given) No - Patient declined    Current Medications (verified) Outpatient Encounter Medications as of 03/12/2020  Medication Sig  . Accu-Chek FastClix Lancets MISC Use as instructed to check blood sugars 1 time per day dx: e11.22  . anastrozole (ARIMIDEX) 1 MG tablet Take 1 tablet (1 mg total) by mouth daily. Start Apr 29, 2019  . baclofen (LIORESAL) 10 MG tablet Take 10 mg by mouth 3 (three) times daily.   . Blood Glucose Monitoring Suppl (ACCU-CHEK GUIDE) w/Device KIT 1 each by Does not apply route daily. Dx: e11.22  . cetirizine (ZYRTEC) 10 MG tablet Take 1 tablet (10 mg total) by mouth daily.  . Cholecalciferol (VITAMIN D) 125 MCG (5000 UT) CAPS Take 5,000 Units by mouth daily.    . COMBIGAN 0.2-0.5 % ophthalmic solution Place 1 drop into both eyes 2 (two) times daily.  . DULoxetine (CYMBALTA) 60 MG capsule Take 1 capsule (60 mg total) by mouth daily.  .Marland Kitchengabapentin (NEURONTIN) 300 MG capsule Take 3 capsules (900 mg total) by mouth 3 (three) times daily.  .Marland Kitchenglucose blood (ACCU-CHEK GUIDE) test strip Use as instructed to check blood sugars 1 time per day dx: e11.22  . KOMBIGLYZE XR 5-500 MG TB24 TAKE 1 TABLET BY MOUTH DAILY BEFORE SUPPER.  .Marland Kitchenlatanoprost (XALATAN) 0.005 % ophthalmic solution Place 1 drop into both eyes at bedtime.  .Marland Kitchenlosartan-hydrochlorothiazide (HYZAAR) 50-12.5 MG tablet TAKE 1 TABLET BY MOUTH EVERY DAY  . SYNTHROID 75 MCG tablet TAKE 75 MCG BY MOUTH DAILY MONDAY - SATURDAY AND HOLD ON SUNDAY  . [DISCONTINUED] pravastatin (PRAVACHOL) 40 MG tablet TAKE 1 TABLET BY MOUTH EVERY DAY  . ondansetron (ZOFRAN) 4 MG tablet Take 1 tablet (4 mg total) by mouth every 8 (eight) hours as needed for nausea or vomiting. (Patient not taking: Reported on 03/12/2020)  . pneumococcal 13-valent conjugate vaccine (PREVNAR 13) SUSP injection Inject 0.5 mLs into the muscle tomorrow at 10 am for 1 dose.   No facility-administered encounter medications on file as of 03/12/2020.    Allergies (verified) Cyclobenzaprine   History: Past Medical History:  Diagnosis Date  . Arthritis   . Breast cancer, right breast ([facility]    pending right breast lumpectomy on September 01, 2018  . Diabetes mellitus type II, controlled ([place]  [place]  . Family history of ovarian cancer   . Family history of thyroid cancer   . GERD (gastroesophageal reflux disease)    rarely occurs- no meds  . Hyperlipidemia associated with type 2 diabetes mellitus ([place])   . Hypertension   . Hypothyroidism   . Personal history of radiation therapy   . Primary localized osteoarthritis of right knee 04/17/2019  . S/P knee replacement 04/17/2019  . Trigeminal neuralgia of left side of face 2015   s/p Gamma  knife   Past Surgical History:  Procedure Laterality Date  . BREAST BIOPSY Right 07/24/2018  . BREAST LUMPECTOMY Right 09/01/2018  . BREAST LUMPECTOMY WITH RADIOACTIVE SEED LOCALIZATION Right 09/01/2018   Procedure: RIGHT BREAST LUMPECTOMY WITH RADIOACTIVE SEED LOCALIZATION;  Surgeon: [name], MD;  Location: [facility];  Service: General;  Laterality: Right;  . COLONOSCOPY    . Gamma Knife Left 04/08/2016  . HYSTEROSCOPY WITH D & C  05/24/2011   Procedure: DILATATION AND CURETTAGE (D&C) /HYSTEROSCOPY;  Surgeon: [name], MD;  Location: [facility];  Service: Gynecology;  Laterality: N/A;  . KNEE SURGERY Right 06/10/15   torn ligament  . PARTIAL KNEE ARTHROPLASTY Right 04/17/2019   Procedure: UNICOMPARTMENTAL KNEE;  Surgeon: [name], MD;  Location: [facility];  Service: Orthopedics;  Laterality: Right;  . THYROIDECTOMY N/A 09/08/2016   Procedure: TOTAL THYROIDECTOMY;  Surgeon: [name], MD;  Location: [facility];  Service: ENT;  Laterality: N/A;  . TUBAL LIGATION    . uterine polyp    . WISDOM TOOTH EXTRACTION Left 2014   Family History  Problem Relation Age of Onset  . Hypertension Mother   . Kidney failure Mother   . Congestive Heart Failure Mother   . Heart Problems Father   . Stroke Father   . Pneumonia Father   . Heart Problems Sister 40       heart transplant  . Stroke Paternal Grandmother   . Dementia Paternal Grandmother   . Stroke Paternal Grandfather   . Cardiomyopathy Sister   . Ovarian cancer Sister 60       d. 30  . Thyroid disease Sister   . Thyroid disease Sister   . Thyroid cancer Niece 54  . Breast cancer Neg Hx    Social History   Socioeconomic History  . Marital status: Married    Spouse name: [name]  . Number of children: 4  . Years of education: BS  . Highest education level: Not on file  Occupational History  . Occupation: Retired    Fish farm manager: OTHER  Tobacco Use  . Smoking status: Never Smoker  . Smokeless tobacco: Never Used    Vaping Use  . Vaping Use: Never used  Substance and Sexual Activity  . Alcohol use: No  . Drug use: No  . Sexual activity: Not Currently  Other Topics Concern  . Not on file  Social History Narrative   Patient lives at home with family.   Caffeine Use: occasionally      Lives at home with husband and son.  Is a retired Psychologist, prison and probation services Strain: [place]   . Difficulty of Paying Living Expenses: Not hard at all  Food Insecurity: No Food Insecurity  . Worried About Charity fundraiser in the Last Year: Never true  . Ran Out of Food in the Last Year: Never true  Transportation Needs: No Transportation Needs  . Lack of Transportation (  Medical): No  . Lack of Transportation (Non-Medical): No  Physical Activity: Inactive  . Days of Exercise per Week: 0 days  . Minutes of Exercise per Session: 0 min  Stress: No Stress Concern Present  . Feeling of Stress : Not at all  Social Connections:   . Frequency of Communication with Friends and Family: Not on file  . Frequency of Social Gatherings with Friends and Family: Not on file  . Attends Religious Services: Not on file  . Active Member of Clubs or Organizations: Not on file  . Attends Archivist Meetings: Not on file  . Marital Status: Not on file    Tobacco Counseling Counseling given: Not Answered   Clinical Intake:  Pre-visit preparation completed: Yes  Pain : 0-10 Pain Score: 6  Pain Type: Chronic pain Pain Location: Face Pain Descriptors / Indicators: Burning Pain Onset: More than a month ago Pain Frequency: Constant     Nutritional Status: BMI 25 -29 Overweight Nutritional Risks: None Diabetes: Yes  How often do you need to have someone help you when you read instructions, pamphlets, or other written materials from your doctor or pharmacy?: 1 - Never What is the last grade level you completed in school?: BS  Diabetic? Yes Nutrition Risk  Assessment:  Has the patient had any N/V/D within the last 2 months?  Yes  Does the patient have any non-healing wounds?  No  Has the patient had any unintentional weight loss or weight gain?  No   Diabetes:  Is the patient diabetic?  Yes  If diabetic, was a CBG obtained today?  No  Did the patient bring in their glucometer from home?  No  How often do you monitor your CBG's? Does not.   Financial Strains and Diabetes Management:  Are you having any financial strains with the device, your supplies or your medication? No.  Does the patient want to be seen by Chronic Care Management for management of their diabetes?  No  Would the patient like to be referred to a Nutritionist or for Diabetic Management?  No   Diabetic Exams:  Diabetic Eye Exam: Completed 12/2019 per patient Diabetic Foot Exam: Overdue, Pt has been advised about the importance in completing this exam. Pt is scheduled for diabetic foot exam on today.  Interpreter Needed?: No  Information entered by :: NAllen LPN   Activities of Daily Living In your present state of health, do you have any difficulty performing the following activities: 03/12/2020 04/17/2019  Hearing? Y N  Comment thinks needs ears flushed -  Vision? N N  Difficulty concentrating or making decisions? N N  Walking or climbing stairs? N Y  Dressing or bathing? N N  Doing errands, shopping? N N  Preparing Food and eating ? N -  Using the Toilet? N -  In the past six months, have you accidently leaked urine? N -  Do you have problems with loss of bowel control? N -  Managing your Medications? N -  Managing your Finances? N -  Housekeeping or managing your Housekeeping? N -  Some recent data might be hidden    Patient Care Team: Glendale Chard, MD as PCP - General (Internal Medicine) [name], MD as PCP - Cardiology (Cardiology) [name], MD as Consulting Physician (Obstetrics and Gynecology) [name], MD as Consulting  Physician (General Surgery) Magrinat, [name], MD as Consulting Physician (Oncology) [name], MD as Consulting Physician (Radiation Oncology) Penni Bombard, MD as  Consulting Physician (Neurology) [name], MD as Consulting Physician (Pulmonary Disease) [name], MD as Consulting Physician (Orthopedic Surgery) Clydell Hakim, MD as Consulting Physician (Anesthesiology)  Indicate any recent Medical Services you may have received from other than [facility] providers in the past year (date may be approximate).     Assessment:   This is a routine wellness examination for Christina Hernandez.  Hearing/Vision screen  Hearing Screening   '125Hz'  '250Hz'  '500Hz'  '1000Hz'  '2000Hz'  '3000Hz'  '4000Hz'  '6000Hz'  '8000Hz'   Right ear:           Left ear:           Vision Screening Comments: Regular eye exams, Dr. Venetia Maxon   Dietary issues and exercise activities discussed: Current Exercise Habits: The patient does not participate in regular exercise at present  Goals    .  DIET - INCREASE WATER INTAKE (pt-stated)    .  Exercise 150 min/wk Moderate Activity (pt-stated)    .  Patient Stated      03/07/2019, wants to start walking    .  Patient Stated      03/12/2020, wants to weigh 165 pounds      Depression Screen PHQ 2/9 Scores 03/12/2020 10/01/2019 04/09/2019 03/08/2019 02/27/2019 12/12/2018 08/10/2018  PHQ - 2 Score 0 0 0 0 0 0 0    Fall Risk Fall Risk  03/12/2020 10/01/2019 04/09/2019 03/08/2019 02/27/2019  Falls in the past year? 0 0 0 0 0  Risk for fall due to : Medication side effect - - Medication side effect -  Follow up Falls evaluation completed;Education provided;Falls prevention discussed - - Falls evaluation completed;Education provided;Falls prevention discussed -    Any stairs in or around the home? Yes  If so, are there any without handrails? No  Home free of loose throw rugs in walkways, pet beds, electrical cords, etc? Yes  Adequate lighting in your home to reduce risk of falls? Yes    ASSISTIVE DEVICES UTILIZED TO PREVENT FALLS:  Life alert? No  Use of a cane, walker or w/c? No  Grab bars in the bathroom? Yes  Shower chair or bench in shower? No  Elevated toilet seat or a handicapped toilet? No   TIMED UP AND GO:  Was the test performed? No .    Gait steady and fast without use of assistive device  Cognitive Function:     6CIT Screen 03/12/2020 03/08/2019 05/10/2018  What Year? 0 points 0 points 0 points  What month? 0 points 0 points 0 points  What time? 0 points 0 points 0 points  Count back from 20 0 points 0 points 0 points  Months in reverse 0 points 0 points 0 points  Repeat phrase 0 points 2 points 0 points  Total Score 0 2 0    Immunizations Immunization History  Administered Date(s) Administered  . Fluad Quad(high Dose 65+) 02/27/2019, 03/12/2020  . Influenza, High Dose Seasonal PF 03/27/2018, 02/27/2019  . Influenza,inj,Quad PF,6+ Mos 04/12/2016  . Moderna SARS-COVID-2 Vaccination 08/06/2019, 09/03/2019  . Pneumococcal Polysaccharide-23 05/25/2016  . Tdap 05/28/2011    TDAP status: Up to date Flu Vaccine status: Completed at today's visit Pneumococcal vaccine status: Sent to pharmacy Covid-19 vaccine status: Completed vaccines  Qualifies for Shingles Vaccine? Yes   Zostavax completed No   Shingrix Completed?: No.    Education has been provided regarding the importance of this vaccine. Patient has been advised to call insurance company to determine out of pocket expense if they have not yet received this vaccine.  Advised may also receive vaccine at local pharmacy or Health Dept. Verbalized acceptance and understanding.  Screening Tests Health Maintenance  Topic Date Due  . PNA vac Low Risk Adult (2 of 2 - PCV13) 05/25/2017  . FOOT EXAM  02/27/2020  . OPHTHALMOLOGY EXAM  03/06/2020  . HEMOGLOBIN A1C  04/01/2020  . TETANUS/TDAP  05/27/2021  . MAMMOGRAM  07/12/2021  . COLONOSCOPY  02/08/2027  . INFLUENZA VACCINE  Completed  . DEXA  SCAN  Completed  . COVID-19 Vaccine  Completed  . Hepatitis C Screening  Completed    Health Maintenance  Health Maintenance Due  Topic Date Due  . PNA vac Low Risk Adult (2 of 2 - PCV13) 05/25/2017  . FOOT EXAM  02/27/2020  . OPHTHALMOLOGY EXAM  03/06/2020    Colorectal cancer screening: Completed 02/07/2017. Repeat every 10 years Mammogram status: Completed 07/13/2019. Repeat every year Bone Density status: Completed 07/29/2014.   Lung Cancer Screening: (Low Dose CT Chest recommended if Age 78-80 years, 30 pack-year currently smoking OR have quit w/in 15years.) does not qualify.   Lung Cancer Screening Referral: no  Additional Screening:  Hepatitis C Screening: does qualify; Completed 06/05/2012  Vision Screening: Recommended annual ophthalmology exams for early detection of glaucoma and other disorders of the eye. Is the patient up to date with their annual eye exam?  Yes  Who is the provider or what is the name of the office in which the patient attends annual eye exams? Dr. Venetia Maxon If pt is not established with a provider, would they like to be referred to a provider to establish care? No .   Dental Screening: Recommended annual dental exams for proper oral hygiene  Community Resource Referral / Chronic Care Management: CRR required this visit?  No   CCM required this visit?  No      Plan:     I have personally reviewed and noted the following in the patient's chart:   . Medical and social history . Use of alcohol, tobacco or illicit drugs  . Current medications and supplements . Functional ability and status . Nutritional status . Physical activity . Advanced directives . List of other physicians . Hospitalizations, surgeries, and ER visits in previous 12 months . Vitals . Screenings to include cognitive, depression, and falls . Referrals and appointments  In addition, I have reviewed and discussed with patient certain preventive protocols, quality metrics,  and best practice recommendations. A written personalized care plan for preventive services as well as general preventive health recommendations were provided to patient.     Kellie Simmering, LPN   [id number]   Nurse Notes:

## 2020-03-12 NOTE — Patient Instructions (Addendum)
INCREASE PRAVASTATIN TO 2 TABLETS M-F AND SKIP WEEKENDS I WILL SEND IN PRAVASTATIN 80MG  TO THE PHARMACY    High Cholesterol  High cholesterol is a condition in which the blood has high levels of a white, waxy, fat-like substance (cholesterol). The human body needs small amounts of cholesterol. The liver makes all the cholesterol that the body needs. Extra (excess) cholesterol comes from the food that we eat. Cholesterol is carried from the liver by the blood through the blood vessels. If you have high cholesterol, deposits (plaques) may build up on the walls of your blood vessels (arteries). Plaques make the arteries narrower and stiffer. Cholesterol plaques increase your risk for heart attack and stroke. Work with your health care provider to keep your cholesterol levels in a healthy range. What increases the risk? This condition is more likely to develop in people who:  Eat foods that are high in animal fat (saturated fat) or cholesterol.  Are overweight.  Are not getting enough exercise.  Have a family history of high cholesterol. What are the signs or symptoms? There are no symptoms of this condition. How is this diagnosed? This condition may be diagnosed from the results of a blood test.  If you are older than age 44, your health care provider may check your cholesterol every 4-6 years.  You may be checked more often if you already have high cholesterol or other risk factors for heart disease. The blood test for cholesterol measures:  "Bad" cholesterol (LDL cholesterol). This is the main type of cholesterol that causes heart disease. The desired level for LDL is less than 100.  "Good" cholesterol (HDL cholesterol). This type helps to protect against heart disease by cleaning the arteries and carrying the LDL away. The desired level for HDL is 60 or higher.  Triglycerides. These are fats that the body can store or burn for energy. The desired number for triglycerides is lower  than 150.  Total cholesterol. This is a measure of the total amount of cholesterol in your blood, including LDL cholesterol, HDL cholesterol, and triglycerides. A healthy number is less than 200. How is this treated? This condition is treated with diet changes, lifestyle changes, and medicines. Diet changes  This may include eating more whole grains, fruits, vegetables, nuts, and fish.  This may also include cutting back on red meat and foods that have a lot of added sugar. Lifestyle changes  Changes may include getting at least 40 minutes of aerobic exercise 3 times a week. Aerobic exercises include walking, biking, and swimming. Aerobic exercise along with a healthy diet can help you maintain a healthy weight.  Changes may also include quitting smoking. Medicines  Medicines are usually given if diet and lifestyle changes have failed to reduce your cholesterol to healthy levels.  Your health care provider may prescribe a statin medicine. Statin medicines have been shown to reduce cholesterol, which can reduce the risk of heart disease. Follow these instructions at home: Eating and drinking If told by your health care provider:  Eat chicken (without skin), fish, veal, shellfish, ground [place] breast, and round or loin cuts of red meat.  Do not eat fried foods or fatty meats, such as hot dogs and salami.  Eat plenty of fruits, such as apples.  Eat plenty of vegetables, such as broccoli, potatoes, and carrots.  Eat beans, peas, and lentils.  Eat grains such as barley, rice, couscous, and bulgur wheat.  Eat pasta without cream sauces.  Use skim or nonfat milk,  and eat low-fat or nonfat yogurt and cheeses.  Do not eat or drink whole milk, cream, ice cream, egg yolks, or hard cheeses.  Do not eat stick margarine or tub margarines that contain trans fats (also called partially hydrogenated oils).  Do not eat saturated tropical oils, such as coconut oil and palm oil.  Do not  eat cakes, cookies, crackers, or other baked goods that contain trans fats.  General instructions  Exercise as directed by your health care provider. Increase your activity level with activities such as gardening, walking, and taking the stairs.  Take over-the-counter and prescription medicines only as told by your health care provider.  Do not use any products that contain nicotine or tobacco, such as cigarettes and e-cigarettes. If you need help quitting, ask your health care provider.  Keep all follow-up visits as told by your health care provider. This is important. Contact a health care provider if:  You are struggling to maintain a healthy diet or weight.  You need help to start on an exercise program.  You need help to stop smoking. Get help right away if:  You have chest pain.  You have trouble breathing. This information is not intended to replace advice given to you by your health care provider. Make sure you discuss any questions you have with your health care provider. Document Revised: 06/17/2017 Document Reviewed: 12/13/2015 Elsevier Patient Education  [employer].

## 2020-03-12 NOTE — Progress Notes (Signed)
I,[name],acting as a scribe for Robyn N Sanders, MD.,have documented all relevant documentation on the behalf of Robyn N Sanders, MD,as directed by  Robyn N Sanders, MD while in the presence of Robyn N Sanders, MD.  This visit occurred during the SARS-CoV-2 public health emergency.  Safety protocols were in place, including screening questions prior to the visit, additional usage of staff PPE, and extensive cleaning of exam room while observing appropriate contact time as indicated for disinfecting solutions.  Subjective:     Patient ID: Christina Hernandez , female    DOB: 02/05/1951 , 68 y.o.   MRN: [id number]   Chief Complaint  Patient presents with  . Hypertension  . Diabetes    HPI  She is here today for diabetes and BP check. She reports compliance with meds.   Hypertension This is a chronic problem. The current episode started more than 1 year ago. The problem has been gradually improving since onset. The problem is controlled. Pertinent negatives include no blurred vision, chest pain, palpitations or shortness of breath. Risk factors for coronary artery disease include diabetes mellitus, dyslipidemia, post-menopausal state and sedentary lifestyle. The current treatment provides moderate improvement. Compliance problems include exercise.   Diabetes She presents for her follow-up diabetic visit. She has type 2 diabetes mellitus. There are no hypoglycemic associated symptoms. There are no diabetic associated symptoms. Pertinent negatives for diabetes include no blurred vision and no chest pain. There are no hypoglycemic complications. Risk factors for coronary artery disease include diabetes mellitus, dyslipidemia, hypertension, sedentary lifestyle and post-menopausal. She participates in exercise intermittently. An ACE inhibitor/angiotensin II receptor blocker is being taken. Eye exam is current.     Past Medical History:  Diagnosis Date  . Arthritis   . Breast cancer,  right breast (HCC)    pending right breast lumpectomy on September 01, 2018  . Diabetes mellitus type II, controlled (HCC)    On Kombiglyze  . Family history of ovarian cancer   . Family history of thyroid cancer   . GERD (gastroesophageal reflux disease)    rarely occurs- no meds  . Hyperlipidemia associated with type 2 diabetes mellitus (HCC)   . Hypertension   . Hypothyroidism   . Personal history of radiation therapy   . Primary localized osteoarthritis of right knee 04/17/2019  . S/P knee replacement 04/17/2019  . Trigeminal neuralgia of left side of face 2015   s/p Gamma knife     Family History  Problem Relation Age of Onset  . Hypertension Mother   . Kidney failure Mother   . Congestive Heart Failure Mother   . Heart Problems Father   . Stroke Father   . Pneumonia Father   . Heart Problems Sister 40       heart transplant  . Stroke Paternal Grandmother   . Dementia Paternal Grandmother   . Stroke Paternal Grandfather   . Cardiomyopathy Sister   . Ovarian cancer Sister 60       d. 62  . Thyroid disease Sister   . Thyroid disease Sister   . Thyroid cancer Niece 46  . Breast cancer Neg Hx      Current Outpatient Medications:  .  Accu-Chek FastClix Lancets MISC, Use as instructed to check blood sugars 1 time per day dx: e11.22, Disp: 150 each, Rfl: 2 .  anastrozole (ARIMIDEX) 1 MG tablet, Take 1 tablet (1 mg total) by mouth daily. Start Apr 29, 2019, Disp: 90 tablet, Rfl: 4 .  baclofen (LIORESAL)   10 MG tablet, Take 10 mg by mouth 3 (three) times daily. , Disp: , Rfl:  .  Blood Glucose Monitoring Suppl (ACCU-CHEK GUIDE) w/Device KIT, 1 each by Does not apply route daily. Dx: e11.22, Disp: 1 kit, Rfl: 1 .  cetirizine (ZYRTEC) 10 MG tablet, Take 1 tablet (10 mg total) by mouth daily., Disp: 30 tablet, Rfl: 3 .  Cholecalciferol (VITAMIN D) 125 MCG (5000 UT) CAPS, Take 5,000 Units by mouth daily., Disp: , Rfl:  .  COMBIGAN 0.2-0.5 % ophthalmic solution, Place 1 drop into both  eyes 2 (two) times daily., Disp: , Rfl:  .  DULoxetine (CYMBALTA) 60 MG capsule, Take 1 capsule (60 mg total) by mouth daily., Disp: 90 capsule, Rfl: 4 .  gabapentin (NEURONTIN) 300 MG capsule, Take 3 capsules (900 mg total) by mouth 3 (three) times daily., Disp: 810 capsule, Rfl: 4 .  glucose blood (ACCU-CHEK GUIDE) test strip, Use as instructed to check blood sugars 1 time per day dx: e11.22, Disp: 150 each, Rfl: 2 .  KOMBIGLYZE XR 5-500 MG TB24, TAKE 1 TABLET BY MOUTH DAILY BEFORE SUPPER., Disp: 90 tablet, Rfl: 1 .  latanoprost (XALATAN) 0.005 % ophthalmic solution, Place 1 drop into both eyes at bedtime., Disp: , Rfl:  .  losartan-hydrochlorothiazide (HYZAAR) 50-12.5 MG tablet, TAKE 1 TABLET BY MOUTH EVERY DAY, Disp: 90 tablet, Rfl: 1 .  ondansetron (ZOFRAN) 4 MG tablet, Take 1 tablet (4 mg total) by mouth every 8 (eight) hours as needed for nausea or vomiting. (Patient not taking: Reported on 03/12/2020), Disp: 10 tablet, Rfl: 0 .  pravastatin (PRAVACHOL) 80 MG tablet, TAKE ONE TAB PO M-F, SKIP SAT/SUN, Disp: 75 tablet, Rfl: 2 .  SYNTHROID 75 MCG tablet, TAKE 75 MCG BY MOUTH DAILY MONDAY - SATURDAY AND HOLD ON SUNDAY, Disp: 90 tablet, Rfl: 1   Allergies  Allergen Reactions  . Cyclobenzaprine Nausea Only     Review of Systems  Constitutional: Negative.   HENT:       Wants to have her ears checked. She thinks they need to be flushed. Denies ear pain.   Eyes: Negative for blurred vision.  Respiratory: Negative.  Negative for shortness of breath.   Cardiovascular: Negative.  Negative for chest pain and palpitations.  Gastrointestinal: Negative.   Neurological: Negative.   Psychiatric/Behavioral: Negative.      Today's Vitals   03/12/20 1153  BP: 138/76  Pulse: 94  Temp: 97.8 F (36.6 C)  TempSrc: Oral  Weight: 174 lb (78.9 kg)  Height: 5' 4.8" (1.646 m)   Body mass index is 29.13 kg/m.   Objective:  Physical Exam Vitals and nursing note reviewed.  Constitutional:       Appearance: Normal appearance.  HENT:     Head: Normocephalic and atraumatic.     Right Ear: Ear canal and external ear normal. There is impacted cerumen.     Left Ear: Ear canal and external ear normal. There is impacted cerumen.  Cardiovascular:     Rate and Rhythm: Normal rate and regular rhythm.     Heart sounds: Normal heart sounds.  Pulmonary:     Effort: Pulmonary effort is normal.     Breath sounds: Normal breath sounds.  Skin:    General: Skin is warm.  Neurological:     General: No focal deficit present.     Mental Status: She is alert.  Psychiatric:        Mood and Affect: Mood normal.          Behavior: Behavior normal.         Assessment And Plan:     1. Essential hypertension Comments: Chronic, fair control. She will continue with current meds for now. Encouraged to avoid adding salt to her foods.   2. Uncontrolled type 2 diabetes mellitus with hyperglycemia (HCC) Comments: Chronic, I will check labs as listed below. Encouraged to avoid sugary beverages and follow dietary recommendations.  - Hemoglobin A1c - CMP14+EGFR  3. Aortic atherosclerosis (HCC) Comments: Chronic. Encouraged to continue with statin therapy, increase exercise and to follow a heart healthy lifestyle.   4. Bilateral impacted cerumen  AFTER OBTAINING VERBAL CONSENT, BOTH EARS WERE  FLUSHED BY IRRIGATION. SHE TOLERATED PROCEDURE WELL WITHOUT ANY COMPLICATIONS. NO TM ABNORMALITIES WERE NOTED.  - Ear Lavage  5. 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   6. Overweight (BMI 25.0-29.9) Comments: Her BMI is stable for her demographic. Encouraged to incorporate more exercise into her daily routine.      Patient was given opportunity to ask questions. Patient verbalized understanding of the plan and was able to repeat key elements of the plan. All questions were answered to their  satisfaction.  [name], MD   I, [name], MD, have reviewed all documentation for this visit. The documentation on 03/24/20 for the exam, diagnosis, procedures, and orders are all accurate and complete.  THE PATIENT IS ENCOURAGED TO PRACTICE SOCIAL DISTANCING DUE TO THE COVID-19 PANDEMIC.

## 2020-03-12 NOTE — Patient Instructions (Signed)
Christina Hernandez , Thank you for taking time to come for your Medicare Wellness Visit. I appreciate your ongoing commitment to your health goals. Please review the following plan we discussed and let me know if I can assist you in the future.   Screening recommendations/referrals: Colonoscopy: completed 02/07/2017 Mammogram: completed 07/19/2019 Bone Density: completed 07/29/2014 Recommended yearly ophthalmology/optometry visit for glaucoma screening and checkup Recommended yearly dental visit for hygiene and checkup  Vaccinations: Influenza vaccine: today Pneumococcal vaccine: sent to pharmacy Tdap vaccine: completed 05/28/2011 Shingles vaccine: discussed   Covid-19: 09/03/2019, 08/06/2019  Advanced directives: Advance directive discussed with you today. I have provided a copy for you to complete at home and have notarized. Once this is complete please bring a copy in to our office so we can scan it into your chart.  Conditions/risks identified: none  Next appointment: 06/11/2020 at 2:15 Follow up in one year for your annual wellness visit    Preventive Care 65 Years and Older, Female Preventive care refers to lifestyle choices and visits with your health care provider that can promote health and wellness. What does preventive care include?  A yearly physical exam. This is also called an annual well check.  Dental exams once or twice a year.  Routine eye exams. Ask your health care provider how often you should have your eyes checked.  Personal lifestyle choices, including:  Daily care of your teeth and gums.  Regular physical activity.  Eating a healthy diet.  Avoiding tobacco and drug use.  Limiting alcohol use.  Practicing safe sex.  Taking low-dose aspirin every day.  Taking vitamin and mineral supplements as recommended by your health care provider. What happens during an annual well check? The services and screenings done by your health care provider during your annual  well check will depend on your age, overall health, lifestyle risk factors, and family history of disease. Counseling  Your health care provider may ask you questions about your:  Alcohol use.  Tobacco use.  Drug use.  Emotional well-being.  Home and relationship well-being.  Sexual activity.  Eating habits.  History of falls.  Memory and ability to understand (cognition).  Work and work Statistician.  Reproductive health. Screening  You may have the following tests or measurements:  Height, weight, and BMI.  Blood pressure.  Lipid and cholesterol levels. These may be checked every 5 years, or more frequently if you are over 67 years old.  Skin check.  Lung cancer screening. You may have this screening every year starting at age 25 if you have a 30-pack-year history of smoking and currently smoke or have quit within the past 15 years.  Fecal occult blood test (FOBT) of the stool. You may have this test every year starting at age 78.  Flexible sigmoidoscopy or colonoscopy. You may have a sigmoidoscopy every 5 years or a colonoscopy every 10 years starting at age 65.  Hepatitis C blood test.  Hepatitis B blood test.  Sexually transmitted disease (STD) testing.  Diabetes screening. This is done by checking your blood sugar (glucose) after you have not eaten for a while (fasting). You may have this done every 1-3 years.  Bone density scan. This is done to screen for osteoporosis. You may have this done starting at age 68.  Mammogram. This may be done every 1-2 years. Talk to your health care provider about how often you should have regular mammograms. Talk with your health care provider about your test results, treatment options, and if  necessary, the need for more tests. Vaccines  Your health care provider may recommend certain vaccines, such as:  Influenza vaccine. This is recommended every year.  Tetanus, diphtheria, and acellular pertussis (Tdap, Td) vaccine.  You may need a Td booster every 10 years.  Zoster vaccine. You may need this after age 30.  Pneumococcal 13-valent conjugate (PCV13) vaccine. One dose is recommended after age 3.  Pneumococcal polysaccharide (PPSV23) vaccine. One dose is recommended after age 89. Talk to your health care provider about which screenings and vaccines you need and how often you need them. This information is not intended to replace advice given to you by your health care provider. Make sure you discuss any questions you have with your health care provider. Document Released: 07/11/2015 Document Revised: 03/03/2016 Document Reviewed: 04/15/2015 Elsevier Interactive Patient Education  2017 [employer] Prevention in the Home Falls can cause injuries. They can happen to people of all ages. There are many things you can do to make your home safe and to help prevent falls. What can I do on the outside of my home?  Regularly fix the edges of walkways and driveways and fix any cracks.  Remove anything that might make you trip as you walk through a door, such as a raised step or threshold.  Trim any bushes or trees on the path to your home.  Use bright outdoor lighting.  Clear any walking paths of anything that might make someone trip, such as rocks or tools.  Regularly check to see if handrails are loose or broken. Make sure that both sides of any steps have handrails.  Any raised decks and porches should have guardrails on the edges.  Have any leaves, snow, or ice cleared regularly.  Use sand or salt on walking paths during winter.  Clean up any spills in your garage right away. This includes oil or grease spills. What can I do in the bathroom?  Use night lights.  Install grab bars by the toilet and in the tub and shower. Do not use towel bars as grab bars.  Use non-skid mats or decals in the tub or shower.  If you need to sit down in the shower, use a plastic, non-slip stool.  Keep the  floor dry. Clean up any water that spills on the floor as soon as it happens.  Remove soap buildup in the tub or shower regularly.  Attach bath mats securely with double-sided non-slip rug tape.  Do not have throw rugs and other things on the floor that can make you trip. What can I do in the bedroom?  Use night lights.  Make sure that you have a light by your bed that is easy to reach.  Do not use any sheets or blankets that are too big for your bed. They should not hang down onto the floor.  Have a firm chair that has side arms. You can use this for support while you get dressed.  Do not have throw rugs and other things on the floor that can make you trip. What can I do in the kitchen?  Clean up any spills right away.  Avoid walking on wet floors.  Keep items that you use a lot in easy-to-reach places.  If you need to reach something above you, use a strong step stool that has a grab bar.  Keep electrical cords out of the way.  Do not use floor polish or wax that makes floors slippery. If you must  use wax, use non-skid floor wax.  Do not have throw rugs and other things on the floor that can make you trip. What can I do with my stairs?  Do not leave any items on the stairs.  Make sure that there are handrails on both sides of the stairs and use them. Fix handrails that are broken or loose. Make sure that handrails are as long as the stairways.  Check any carpeting to make sure that it is firmly attached to the stairs. Fix any carpet that is loose or worn.  Avoid having throw rugs at the top or bottom of the stairs. If you do have throw rugs, attach them to the floor with carpet tape.  Make sure that you have a light switch at the top of the stairs and the bottom of the stairs. If you do not have them, ask someone to add them for you. What else can I do to help prevent falls?  Wear shoes that:  Do not have high heels.  Have rubber bottoms.  Are comfortable and fit  you well.  Are closed at the toe. Do not wear sandals.  If you use a stepladder:  Make sure that it is fully opened. Do not climb a closed stepladder.  Make sure that both sides of the stepladder are locked into place.  Ask someone to hold it for you, if possible.  Clearly mark and make sure that you can see:  Any grab bars or handrails.  First and last steps.  Where the edge of each step is.  Use tools that help you move around (mobility aids) if they are needed. These include:  Canes.  Walkers.  Scooters.  Crutches.  Turn on the lights when you go into a dark area. Replace any light bulbs as soon as they burn out.  Set up your furniture so you have a clear path. Avoid moving your furniture around.  If any of your floors are uneven, fix them.  If there are any pets around you, be aware of where they are.  Review your medicines with your doctor. Some medicines can make you feel dizzy. This can increase your chance of falling. Ask your doctor what other things that you can do to help prevent falls. This information is not intended to replace advice given to you by your health care provider. Make sure you discuss any questions you have with your health care provider. Document Released: 04/10/2009 Document Revised: 11/20/2015 Document Reviewed: 07/19/2014 Elsevier Interactive Patient Education  2017 [employer].

## 2020-03-12 NOTE — Addendum Note (Signed)
Addended by: Glenna Durand E on: 03/12/2020 12:59 PM   Modules accepted: Orders

## 2020-03-21 LAB — CMP14+EGFR
ALT: 19 IU/L (ref 0–32)
AST: 20 IU/L (ref 0–40)
Albumin/Globulin Ratio: 1.6 (ref 1.2–2.2)
Albumin: 4.6 g/dL (ref 3.8–4.8)
Alkaline Phosphatase: 117 IU/L (ref 44–121)
BUN/Creatinine Ratio: 13 (ref 12–28)
BUN: 10 mg/dL (ref 8–27)
Bilirubin Total: 0.8 mg/dL (ref 0.0–1.2)
CO2: 23 mmol/L (ref 20–29)
Calcium: 10 mg/dL (ref 8.7–10.3)
Chloride: >140 mmol/L (ref 96–106)
Creatinine, Ser: 0.75 mg/dL (ref 0.57–1.00)
GFR calc Af Amer: 93 mL/min/{1.73_m2} (ref >59)
GFR calc non Af Amer: 81 mL/min/{1.73_m2} (ref >59)
Globulin, Total: 2.9 g/dL (ref 1.5–4.5)
Glucose: 90 mg/dL (ref 65–99)
Potassium: 6.1 mmol/L — ABNORMAL HIGH (ref 3.5–5.2)
Total Protein: 7.5 g/dL (ref 6.0–8.5)

## 2020-03-21 LAB — HEMOGLOBIN A1C
Est. average glucose Bld gHb Est-mCnc: 134 mg/dL
Hgb A1c MFr Bld: 6.3 % — ABNORMAL HIGH (ref 4.8–5.6)

## 2020-03-24 ENCOUNTER — Other Ambulatory Visit: Payer: Self-pay

## 2020-03-24 ENCOUNTER — Other Ambulatory Visit: Payer: Medicare PPO

## 2020-03-24 DIAGNOSIS — E878 Other disorders of electrolyte and fluid balance, not elsewhere classified: Secondary | ICD-10-CM

## 2020-03-25 ENCOUNTER — Other Ambulatory Visit: Payer: Medicare PPO

## 2020-03-25 ENCOUNTER — Other Ambulatory Visit: Payer: Self-pay | Admitting: Internal Medicine

## 2020-03-25 DIAGNOSIS — E878 Other disorders of electrolyte and fluid balance, not elsewhere classified: Secondary | ICD-10-CM | POA: Diagnosis not present

## 2020-03-26 LAB — BMP8+EGFR
BUN/Creatinine Ratio: 18 (ref 12–28)
BUN: 11 mg/dL (ref 8–27)
CO2: 26 mmol/L (ref 20–29)
Calcium: 9.5 mg/dL (ref 8.7–10.3)
Chloride: 101 mmol/L (ref 96–106)
Creatinine, Ser: 0.62 mg/dL (ref 0.57–1.00)
GFR calc Af Amer: 106 mL/min/{1.73_m2} (ref >59)
GFR calc non Af Amer: 92 mL/min/{1.73_m2} (ref >59)
Glucose: 86 mg/dL (ref 65–99)
Potassium: 3.8 mmol/L (ref 3.5–5.2)
Sodium: 141 mmol/L (ref 134–144)

## 2020-04-18 ENCOUNTER — Telehealth: Payer: Self-pay | Admitting: Diagnostic Neuroimaging

## 2020-04-18 NOTE — Telephone Encounter (Signed)
Pt called needing to discuss an error that was made with her gabapentin (NEURONTIN) 300 MG capsule Please advise.

## 2020-04-21 NOTE — Telephone Encounter (Signed)
Called patient, phone continuously rang.

## 2020-04-21 NOTE — Telephone Encounter (Signed)
Pt has called Stanton Kidney C,RN back.  Please call.

## 2020-04-21 NOTE — Telephone Encounter (Signed)
Called mobile, no answer, VMB not set up. Called home #, no answer, no VM. Will try later.

## 2020-04-22 NOTE — Telephone Encounter (Signed)
Pt returned phone. Pt said would like a call back.

## 2020-04-22 NOTE — Telephone Encounter (Signed)
Called patient

## 2020-04-22 NOTE — Telephone Encounter (Addendum)
Called patient on her mobile #, who stated her PCP gave gabapentin refill but only allowed 1 pill 3 x day. She stated she has called PCP to send in new Rx, but has not gotten call back. She is asking Dr [name] to send in new Rx for gabapentin take 3 tabs three x daily since he originally prescribed it for her. I advised will send her request and let her know. Patient verbalized understanding, appreciation.

## 2020-04-23 MED ORDER — GABAPENTIN 300 MG PO CAPS: 900.0000 mg | ORAL_CAPSULE | Freq: Three times a day (TID) | ORAL | 3 refills | Status: DC

## 2020-04-23 NOTE — Telephone Encounter (Signed)
Called patient; she stated she went to pain management but they told her they had done all they could for her. They discharged her. I advised Dr [name] will refill gabapentin. Patient verbalized understanding, appreciation.

## 2020-04-23 NOTE — Telephone Encounter (Signed)
I had referred her to pain mgmt. But ok to refill if she has not established with pain mgmt yet. -VRP

## 2020-04-23 NOTE — Addendum Note (Signed)
Addended by: Minna Antis on: 04/23/2020 04:37 PM   Modules accepted: Orders

## 2020-04-29 ENCOUNTER — Other Ambulatory Visit: Payer: Self-pay

## 2020-04-29 ENCOUNTER — Encounter: Payer: Self-pay | Admitting: Internal Medicine

## 2020-04-29 ENCOUNTER — Ambulatory Visit (INDEPENDENT_AMBULATORY_CARE_PROVIDER_SITE_OTHER): Payer: Medicare PPO | Admitting: Internal Medicine

## 2020-04-29 VITALS — BP 136/70 | HR 96 | Temp 98.1°F | Ht 64.8 in | Wt 176.0 lb

## 2020-04-29 DIAGNOSIS — E7800 Pure hypercholesterolemia, unspecified: Secondary | ICD-10-CM

## 2020-04-29 DIAGNOSIS — G500 Trigeminal neuralgia: Secondary | ICD-10-CM

## 2020-04-29 DIAGNOSIS — Z79899 Other long term (current) drug therapy: Secondary | ICD-10-CM | POA: Diagnosis not present

## 2020-04-29 DIAGNOSIS — I10 Essential (primary) hypertension: Secondary | ICD-10-CM

## 2020-04-29 MED ORDER — BACLOFEN 10 MG PO TABS: 10.0000 mg | ORAL_TABLET | Freq: Three times a day (TID) | ORAL | 1 refills | Status: DC | PRN

## 2020-04-29 NOTE — Progress Notes (Signed)
I,Katawbba Wiggins,acting as a Education administrator for [name], MD.,have documented all relevant documentation on the behalf of [name], MD,as directed by  [name], MD while in the presence of [name], MD.  This visit occurred during the SARS-CoV-2 public health emergency.  Safety protocols were in place, including screening questions prior to the visit, additional usage of staff PPE, and extensive cleaning of exam room while observing appropriate contact time as indicated for disinfecting solutions.  Subjective:     Patient ID: Christina Hernandez , female    DOB: 05-20-1951 , 69 y.o.   MRN: [id number]   Chief Complaint  Patient presents with  . Hyperlipidemia    HPI  She is here today for a cholesterol check.  The dose of pravastatin was increased at her last visit to 33m. She takes it M-F and skips the weekends.  She has not had any issues with the medication.     Past Medical History:  Diagnosis Date  . Arthritis   . Breast cancer, right breast ([facility]    pending right breast lumpectomy on September 01, 2018  . Diabetes mellitus type II, controlled ([facility]    On Kombiglyze  . Family history of ovarian cancer   . Family history of thyroid cancer   . GERD (gastroesophageal reflux disease)    rarely occurs- no meds  . Hyperlipidemia associated with type 2 diabetes mellitus ([facility]   . Hypertension   . Hypothyroidism   . Personal history of radiation therapy   . Primary localized osteoarthritis of right knee 04/17/2019  . S/P knee replacement 04/17/2019  . Trigeminal neuralgia of left side of face 2015   s/p Gamma knife     Family History  Problem Relation Age of Onset  . Hypertension Mother   . Kidney failure Mother   . Congestive Heart Failure Mother   . Heart Problems Father   . Stroke Father   . Pneumonia Father   . Heart Problems Sister 40       heart transplant  . Stroke Paternal Grandmother   . Dementia Paternal Grandmother   . Stroke Paternal  Grandfather   . Cardiomyopathy Sister   . Ovarian cancer Sister 661      d. 662 . Thyroid disease Sister   . Thyroid disease Sister   . Thyroid cancer Niece 437 . Breast cancer Neg Hx      Current Outpatient Medications:  .  Accu-Chek FastClix Lancets MISC, Use as instructed to check blood sugars 1 time per day dx: e11.22, Disp: 150 each, Rfl: 2 .  anastrozole (ARIMIDEX) 1 MG tablet, Take 1 tablet (1 mg total) by mouth daily. Start Apr 29, 2019, Disp: 90 tablet, Rfl: 4 .  baclofen (LIORESAL) 10 MG tablet, Take 1 tablet (10 mg total) by mouth 3 (three) times daily as needed for muscle spasms., Disp: 90 each, Rfl: 1 .  Cholecalciferol (VITAMIN D) 125 MCG (5000 UT) CAPS, Take 5,000 Units by mouth daily., Disp: , Rfl:  .  COMBIGAN 0.2-0.5 % ophthalmic solution, Place 1 drop into both eyes 2 (two) times daily., Disp: , Rfl:  .  DULoxetine (CYMBALTA) 60 MG capsule, Take 1 capsule (60 mg total) by mouth daily., Disp: 90 capsule, Rfl: 4 .  gabapentin (NEURONTIN) 300 MG capsule, Take 3 capsules (900 mg total) by mouth 3 (three) times daily., Disp: 810 capsule, Rfl: 3 .  glucose blood (ACCU-CHEK GUIDE) test strip, Use as instructed to  check blood sugars 1 time per day dx: e11.22, Disp: 150 each, Rfl: 2 .  KOMBIGLYZE XR 5-500 MG TB24, TAKE 1 TABLET BY MOUTH DAILY BEFORE SUPPER., Disp: 90 tablet, Rfl: 1 .  latanoprost (XALATAN) 0.005 % ophthalmic solution, Place 1 drop into both eyes at bedtime., Disp: , Rfl:  .  losartan-hydrochlorothiazide (HYZAAR) 50-12.5 MG tablet, TAKE 1 TABLET BY MOUTH EVERY DAY, Disp: 90 tablet, Rfl: 1 .  pravastatin (PRAVACHOL) 80 MG tablet, TAKE ONE TAB PO M-F, SKIP SAT/SUN, Disp: 75 tablet, Rfl: 2 .  SYNTHROID 75 MCG tablet, TAKE 75 MCG BY MOUTH DAILY MONDAY - SATURDAY AND HOLD ON SUNDAY, Disp: 90 tablet, Rfl: 1 .  Blood Glucose Monitoring Suppl (ACCU-CHEK GUIDE) w/Device KIT, 1 each by Does not apply route daily. Dx: e11.22, Disp: 1 kit, Rfl: 1 .  cetirizine (ZYRTEC) 10 MG  tablet, Take 1 tablet (10 mg total) by mouth daily. (Patient not taking: Reported on 04/29/2020), Disp: 30 tablet, Rfl: 3 .  ondansetron (ZOFRAN) 4 MG tablet, Take 1 tablet (4 mg total) by mouth every 8 (eight) hours as needed for nausea or vomiting. (Patient not taking: Reported on 03/12/2020), Disp: 10 tablet, Rfl: 0   Allergies  Allergen Reactions  . Cyclobenzaprine Nausea Only     Review of Systems  Constitutional: Negative.   Respiratory: Negative.   Cardiovascular: Negative.   Gastrointestinal: Negative.   Neurological: Positive for numbness.       She is still having issues with trigeminal neuralgia.  She is s/p gamma knife surgery. Advised by pain clinic and neurologist that all treatment options have been exhausted. She had gamma knife procedure in 2018, which unfortunately worsened her sx.   Psychiatric/Behavioral: Negative.   All other systems reviewed and are negative.    Today's Vitals   04/29/20 1157  BP: 136/70  Pulse: 96  Temp: 98.1 F (36.7 C)  TempSrc: Oral  Weight: 176 lb (79.8 kg)  Height: 5' 4.8" (1.646 m)  PainSc: 5   PainLoc: Face   Body mass index is 29.47 kg/m.  Wt Readings from Last 3 Encounters:  04/29/20 176 lb (79.8 kg)  03/12/20 174 lb (78.9 kg)  03/12/20 174 lb (78.9 kg)   Objective:  Physical Exam Vitals and nursing note reviewed.  Constitutional:      Appearance: Normal appearance.  HENT:     Head: Normocephalic and atraumatic.  Cardiovascular:     Rate and Rhythm: Normal rate and regular rhythm.     Heart sounds: Normal heart sounds.  Pulmonary:     Breath sounds: Normal breath sounds.  Skin:    General: Skin is warm.  Neurological:     General: No focal deficit present.     Mental Status: She is alert and oriented to person, place, and time.         Assessment And Plan:     1. Pure hypercholesterolemia Comments: Chronic, I will check lipid panel and LFTs. I will send in higher dose of pravastatin if this has improved. -  Lipid panel  2. Trigeminal neuralgia of left side of face Comments: Unfortunately, she is still having mod-severe sx. She was given refill of baclofen as requested. I will also refer her to [facility] at her request. - Ambulatory referral to Neurology  3. Essential hypertension Comments: Chronic, fair control. Pt advised that BP should be less than 130/80. Encouraged to avoid adding salt to her foods.   4. Drug therapy - ALT  Patient was given opportunity to ask questions. Patient verbalized understanding of the plan and was able to repeat key elements of the plan. All questions were answered to their satisfaction.  [name], MD   I, [name], MD, have reviewed all documentation for this visit. The documentation on 05/01/20 for the exam, diagnosis, procedures, and orders are all accurate and complete.  THE PATIENT IS ENCOURAGED TO PRACTICE SOCIAL DISTANCING DUE TO THE COVID-19 PANDEMIC.

## 2020-04-29 NOTE — Patient Instructions (Signed)
Diabetes Mellitus and Foot Care Foot care is an important part of your health, especially when you have diabetes. Diabetes may cause you to have problems because of poor blood flow (circulation) to your feet and legs, which can cause your skin to:  Become thinner and drier.  Break more easily.  Heal more slowly.  Peel and crack. You may also have nerve damage (neuropathy) in your legs and feet, causing decreased feeling in them. This means that you may not notice minor injuries to your feet that could lead to more serious problems. Noticing and addressing any potential problems early is the best way to prevent future foot problems. How to care for your feet Foot hygiene  Wash your feet daily with warm water and mild soap. Do not use hot water. Then, pat your feet and the areas between your toes until they are completely dry. Do not soak your feet as this can dry your skin.  Trim your toenails straight across. Do not dig under them or around the cuticle. File the edges of your nails with an emery board or nail file.  Apply a moisturizing lotion or petroleum jelly to the skin on your feet and to dry, brittle toenails. Use lotion that does not contain alcohol and is unscented. Do not apply lotion between your toes. Shoes and socks  Wear clean socks or stockings every day. Make sure they are not too tight. Do not wear knee-high stockings since they may decrease blood flow to your legs.  Wear shoes that fit properly and have enough cushioning. Always look in your shoes before you put them on to be sure there are no objects inside.  To break in new shoes, wear them for just a few hours a day. This prevents injuries on your feet. Wounds, scrapes, corns, and calluses  Check your feet daily for blisters, cuts, bruises, sores, and redness. If you cannot see the bottom of your feet, use a mirror or ask someone for help.  Do not cut corns or calluses or try to remove them with medicine.  If you  find a minor scrape, cut, or break in the skin on your feet, keep it and the skin around it clean and dry. You may clean these areas with mild soap and water. Do not clean the area with peroxide, alcohol, or iodine.  If you have a wound, scrape, corn, or callus on your foot, look at it several times a day to make sure it is healing and not infected. Check for: ? Redness, swelling, or pain. ? Fluid or blood. ? Warmth. ? Pus or a bad smell. General instructions  Do not cross your legs. This may decrease blood flow to your feet.  Do not use heating pads or hot water bottles on your feet. They may burn your skin. If you have lost feeling in your feet or legs, you may not know this is happening until it is too late.  Protect your feet from hot and cold by wearing shoes, such as at the beach or on hot pavement.  Schedule a complete foot exam at least once a year (annually) or more often if you have foot problems. If you have foot problems, report any cuts, sores, or bruises to your health care provider immediately. Contact a health care provider if:  You have a medical condition that increases your risk of infection and you have any cuts, sores, or bruises on your feet.  You have an injury that is not   healing.  You have redness on your legs or feet.  You feel burning or tingling in your legs or feet.  You have pain or cramps in your legs and feet.  Your legs or feet are numb.  Your feet always feel cold.  You have pain around a toenail. Get help right away if:  You have a wound, scrape, corn, or callus on your foot and: ? You have pain, swelling, or redness that gets worse. ? You have fluid or blood coming from the wound, scrape, corn, or callus. ? Your wound, scrape, corn, or callus feels warm to the touch. ? You have pus or a bad smell coming from the wound, scrape, corn, or callus. ? You have a fever. ? You have a red line going up your leg. Summary  Check your feet every day  for cuts, sores, red spots, swelling, and blisters.  Moisturize feet and legs daily.  Wear shoes that fit properly and have enough cushioning.  If you have foot problems, report any cuts, sores, or bruises to your health care provider immediately.  Schedule a complete foot exam at least once a year (annually) or more often if you have foot problems. This information is not intended to replace advice given to you by your health care provider. Make sure you discuss any questions you have with your health care provider. Document Revised: 03/07/2019 Document Reviewed: 07/16/2016 Elsevier Patient Education  2020 [employer]  

## 2020-04-30 LAB — LIPID PANEL
Chol/HDL Ratio: 3.1 ratio (ref 0.0–4.4)
Cholesterol, Total: 162 mg/dL (ref 100–199)
HDL: 52 mg/dL (ref >39)
LDL Chol Calc (NIH): 89 mg/dL (ref 0–99)
Triglycerides: 117 mg/dL (ref 0–149)
VLDL Cholesterol Cal: 21 mg/dL (ref 5–40)

## 2020-04-30 LAB — ALT: ALT: 20 IU/L (ref 0–32)

## 2020-05-02 ENCOUNTER — Other Ambulatory Visit: Payer: Self-pay

## 2020-05-02 MED ORDER — ATORVASTATIN CALCIUM 40 MG PO TABS: ORAL_TABLET | ORAL | 1 refills | Status: DC

## 2020-05-19 ENCOUNTER — Other Ambulatory Visit: Payer: Self-pay | Admitting: Oncology

## 2020-05-20 ENCOUNTER — Telehealth: Payer: Self-pay | Admitting: Oncology

## 2020-05-20 NOTE — Telephone Encounter (Signed)
Mailed letter per 11/22 sch msg , with appt date and time

## 2020-05-21 ENCOUNTER — Other Ambulatory Visit: Payer: Self-pay | Admitting: Diagnostic Neuroimaging

## 2020-05-23 ENCOUNTER — Other Ambulatory Visit: Payer: Self-pay | Admitting: Internal Medicine

## 2020-05-26 NOTE — Telephone Encounter (Signed)
Baclofen refill

## 2020-06-11 ENCOUNTER — Other Ambulatory Visit: Payer: Self-pay

## 2020-06-11 ENCOUNTER — Encounter: Payer: Self-pay | Admitting: Internal Medicine

## 2020-06-11 ENCOUNTER — Ambulatory Visit (INDEPENDENT_AMBULATORY_CARE_PROVIDER_SITE_OTHER): Payer: Medicare PPO | Admitting: Internal Medicine

## 2020-06-11 VITALS — BP 114/76 | HR 94 | Temp 98.2°F | Ht 64.8 in | Wt 172.8 lb

## 2020-06-11 DIAGNOSIS — H6121 Impacted cerumen, right ear: Secondary | ICD-10-CM | POA: Diagnosis not present

## 2020-06-11 DIAGNOSIS — I700 Atherosclerosis of aorta: Secondary | ICD-10-CM | POA: Diagnosis not present

## 2020-06-11 DIAGNOSIS — E663 Overweight: Secondary | ICD-10-CM

## 2020-06-11 DIAGNOSIS — I10 Essential (primary) hypertension: Secondary | ICD-10-CM | POA: Diagnosis not present

## 2020-06-11 DIAGNOSIS — Z6828 Body mass index (BMI) 28.0-28.9, adult: Secondary | ICD-10-CM

## 2020-06-11 DIAGNOSIS — E1165 Type 2 diabetes mellitus with hyperglycemia: Secondary | ICD-10-CM | POA: Diagnosis not present

## 2020-06-11 DIAGNOSIS — Z0000 Encounter for general adult medical examination without abnormal findings: Secondary | ICD-10-CM

## 2020-06-11 DIAGNOSIS — E7800 Pure hypercholesterolemia, unspecified: Secondary | ICD-10-CM

## 2020-06-11 DIAGNOSIS — E890 Postprocedural hypothyroidism: Secondary | ICD-10-CM

## 2020-06-11 NOTE — Progress Notes (Signed)
I,Katawbba Wiggins,acting as a Education administrator for [name], MD.,have documented all relevant documentation on the behalf of [name], MD,as directed by  [name], MD while in the presence of [name], MD.  This visit occurred during the SARS-CoV-2 public health emergency.  Safety protocols were in place, including screening questions prior to the visit, additional usage of staff PPE, and extensive cleaning of exam room while observing appropriate contact time as indicated for disinfecting solutions.  Subjective:     Patient ID: Christina Hernandez , female    DOB: 04/21/1951 , 69 y.o.   MRN: [id number]   Chief Complaint  Patient presents with  . Annual Exam  . Diabetes  . Hypertension    Reports compliance with meds.     HPI  She is here today for a full physical examination. She is followed by Dr. Simona Huh for her GYN exams. She was last seen in 2019.   Diabetes She presents for her follow-up diabetic visit. She has type 2 diabetes mellitus. There are no hypoglycemic associated symptoms. There are no diabetic associated symptoms. Pertinent negatives for diabetes include no blurred vision and no chest pain. There are no hypoglycemic complications. Risk factors for coronary artery disease include diabetes mellitus, dyslipidemia, hypertension, sedentary lifestyle and post-menopausal. She participates in exercise intermittently. An ACE inhibitor/angiotensin II receptor blocker is being taken. Eye exam is current.  Hypertension This is a chronic problem. The current episode started more than 1 year ago. The problem has been gradually improving since onset. The problem is controlled. Pertinent negatives include no blurred vision, chest pain, palpitations or shortness of breath. Compliance problems include exercise.      Past Medical History:  Diagnosis Date  . Arthritis   . Breast cancer, right breast [facility])    pending right breast lumpectomy on September 01, 2018  .  Diabetes mellitus type II, controlled ([place])    On Kombiglyze  . Family history of ovarian cancer   . Family history of thyroid cancer   . GERD (gastroesophageal reflux disease)    rarely occurs- no meds  . Hyperlipidemia associated with type 2 diabetes mellitus ([place])   . Hypertension   . Hypothyroidism   . Personal history of radiation therapy   . Primary localized osteoarthritis of right knee 04/17/2019  . S/P knee replacement 04/17/2019  . Trigeminal neuralgia of left side of face 2015   s/p Gamma knife     Family History  Problem Relation Age of Onset  . Hypertension Mother   . Kidney failure Mother   . Congestive Heart Failure Mother   . Heart Problems Father   . Stroke Father   . Pneumonia Father   . Heart Problems Sister 40       heart transplant  . Stroke Paternal Grandmother   . Dementia Paternal Grandmother   . Stroke Paternal Grandfather   . Cardiomyopathy Sister   . Ovarian cancer Sister 34       d. 39  . Thyroid disease Sister   . Thyroid disease Sister   . Thyroid cancer Niece 49  . Breast cancer Neg Hx      Current Outpatient Medications:  .  Accu-Chek FastClix Lancets MISC, Use as instructed to check blood sugars 1 time per day dx: e11.22, Disp: 150 each, Rfl: 2 .  anastrozole (ARIMIDEX) 1 MG tablet, TAKE 1 TABLET (1 MG TOTAL) BY MOUTH DAILY. START Apr 29, 2019, Disp: 90 tablet, Rfl: 4 .  Blood Glucose Monitoring Suppl (ACCU-CHEK GUIDE) w/Device KIT, 1 each by Does not apply route daily. Dx: e11.22, Disp: 1 kit, Rfl: 1 .  cetirizine (ZYRTEC) 10 MG tablet, Take 1 tablet (10 mg total) by mouth daily., Disp: 30 tablet, Rfl: 3 .  Cholecalciferol (VITAMIN D) 125 MCG (5000 UT) CAPS, Take 5,000 Units by mouth daily., Disp: , Rfl:  .  COMBIGAN 0.2-0.5 % ophthalmic solution, Place 1 drop into both eyes 2 (two) times daily., Disp: , Rfl:  .  DULoxetine (CYMBALTA) 60 MG capsule, Take 1 capsule (60 mg total) by mouth daily., Disp: 90 capsule, Rfl: 4 .  gabapentin  (NEURONTIN) 300 MG capsule, Take 3 capsules (900 mg total) by mouth 3 (three) times daily., Disp: 810 capsule, Rfl: 3 .  glucose blood (ACCU-CHEK GUIDE) test strip, Use as instructed to check blood sugars 1 time per day dx: e11.22, Disp: 150 each, Rfl: 2 .  KOMBIGLYZE XR 5-500 MG TB24, TAKE 1 TABLET BY MOUTH DAILY BEFORE SUPPER., Disp: 90 tablet, Rfl: 1 .  latanoprost (XALATAN) 0.005 % ophthalmic solution, Place 1 drop into both eyes at bedtime., Disp: , Rfl:  .  losartan-hydrochlorothiazide (HYZAAR) 50-12.5 MG tablet, TAKE 1 TABLET BY MOUTH EVERY DAY, Disp: 90 tablet, Rfl: 1 .  pravastatin (PRAVACHOL) 80 MG tablet, TAKE ONE TAB PO M-F, SKIP SAT/SUN, Disp: 75 tablet, Rfl: 2 .  SYNTHROID 75 MCG tablet, TAKE 75 MCG BY MOUTH DAILY MONDAY - SATURDAY AND HOLD ON SUNDAY, Disp: 90 tablet, Rfl: 1 .  baclofen (LIORESAL) 10 MG tablet, TAKE 1 TABLET BY MOUTH THREE TIMES A DAY AS NEEDED FOR MUSCLE SPASMS, Disp: 90 tablet, Rfl: 1 .  ondansetron (ZOFRAN) 4 MG tablet, Take 1 tablet (4 mg total) by mouth every 8 (eight) hours as needed for nausea or vomiting. (Patient not taking: Reported on 06/11/2020), Disp: 10 tablet, Rfl: 0   Allergies  Allergen Reactions  . Cyclobenzaprine Nausea Only      The patient states she uses post menopausal status for birth control. Last LMP was No LMP recorded. Patient is postmenopausal.. Negative for Dysmenorrhea. Negative for: breast discharge, breast lump(s), breast pain and breast self exam. Associated symptoms include abnormal vaginal bleeding. Pertinent negatives include abnormal bleeding (hematology), anxiety, decreased libido, depression, difficulty falling sleep, dyspareunia, history of infertility, nocturia, sexual dysfunction, sleep disturbances, urinary incontinence, urinary urgency, vaginal discharge and vaginal itching. Diet regular.The patient states her exercise level is  intermittent.  . The patient's tobacco use is:  Social History   Tobacco Use  Smoking Status  Never Smoker  Smokeless Tobacco Never Used  . She has been exposed to passive smoke. The patient's alcohol use is:  Social History   Substance and Sexual Activity  Alcohol Use No    Review of Systems  Constitutional: Negative.   HENT: Negative.   Eyes: Negative.  Negative for blurred vision.  Respiratory: Negative.  Negative for shortness of breath.   Cardiovascular: Negative.  Negative for chest pain and palpitations.  Gastrointestinal: Negative.   Endocrine: Negative.   Genitourinary: Negative.   Musculoskeletal: Negative.   Skin: Negative.   Allergic/Immunologic: Negative.   Neurological: Negative.   Hematological: Negative.   Psychiatric/Behavioral: Negative.   All other systems reviewed and are negative.    Today's Vitals   06/11/20 1431  BP: 114/76  Pulse: 94  Temp: 98.2 F (36.8 C)  TempSrc: Oral  Weight: 172 lb 12.8 oz (78.4 kg)  Height: 5' 4.8" (1.646 m)  PainSc: 5  PainLoc: Abdomen   Body mass index is 28.93 kg/m.  Wt Readings from Last 3 Encounters:  06/19/20 174 lb 9.6 oz (79.2 kg)  06/11/20 172 lb 12.8 oz (78.4 kg)  04/29/20 176 lb (79.8 kg)   Objective:  Physical Exam Vitals and nursing note reviewed.  Constitutional:      Appearance: Normal appearance. She is obese.  HENT:     Head: Normocephalic and atraumatic.     Right Ear: Ear canal and external ear normal. There is impacted cerumen.     Left Ear: Tympanic membrane, ear canal and external ear normal. There is no impacted cerumen.     Nose:     Comments: Deferred, masked    Mouth/Throat:     Comments: Deferred, masked Eyes:     Extraocular Movements: Extraocular movements intact.     Conjunctiva/sclera: Conjunctivae normal.     Pupils: Pupils are equal, round, and reactive to light.  Cardiovascular:     Rate and Rhythm: Normal rate and regular rhythm.     Pulses:          Dorsalis pedis pulses are 2+ on the right side and 2+ on the left side.     Heart sounds: Normal heart sounds.   Pulmonary:     Breath sounds: Normal breath sounds.  Chest:  Breasts:     Tanner Score is 5.     Right: Normal.     Left: Normal.      Comments: Healed scar on right Abdominal:     General: Abdomen is flat. Bowel sounds are normal.     Palpations: Abdomen is soft.  Genitourinary:    Comments: Deferred Musculoskeletal:        General: Normal range of motion.     Cervical back: Normal range of motion.  Feet:     Right foot:     Protective Sensation: 5 sites tested. 5 sites sensed.     Skin integrity: Dry skin present.     Toenail Condition: Right toenails are normal.     Left foot:     Protective Sensation: 5 sites tested. 5 sites sensed.     Skin integrity: Dry skin present.     Toenail Condition: Left toenails are normal.  Skin:    General: Skin is warm.  Neurological:     General: No focal deficit present.     Mental Status: She is alert and oriented to person, place, and time.  Psychiatric:        Mood and Affect: Mood normal.        Behavior: Behavior normal.        Thought Content: Thought content normal.        Judgment: Judgment normal.         Assessment And Plan:     1. Routine general medical examination at health care facility Comments: A full exam was performed. Importance of monthly self breast exams was discussed with the patient. PATIENT IS ADVISED TO GET 30-45 MINUTES REGULAR EXERCISE NO LESS THAN FOUR TO FIVE DAYS PER WEEK - BOTH WEIGHTBEARING EXERCISES AND AEROBIC ARE RECOMMENDED.  PATIENT IS ADVISED TO FOLLOW A HEALTHY DIET WITH AT LEAST SIX FRUITS/VEGGIES PER DAY, DECREASE INTAKE OF RED MEAT, AND TO INCREASE FISH INTAKE TO TWO DAYS PER WEEK.  MEATS/FISH SHOULD NOT BE FRIED, BAKED OR BROILED IS PREFERABLE.  I SUGGEST WEARING SPF 50 SUNSCREEN ON EXPOSED PARTS AND ESPECIALLY WHEN IN THE DIRECT SUNLIGHT FOR AN EXTENDED PERIOD OF TIME.  PLEASE AVOID FAST FOOD RESTAURANTS AND INCREASE YOUR WATER INTAKE.  2. Uncontrolled type 2 diabetes mellitus with  hyperglycemia ([place]) Comments: Diabetic foot exam was performed. She is encouraged to schedule eye exam. I DISCUSSED WITH THE PATIENT AT LENGTH REGARDING THE GOALS OF GLYCEMIC CONTROL AND POSSIBLE LONG-TERM COMPLICATIONS.  I  ALSO STRESSED THE IMPORTANCE OF COMPLIANCE WITH HOME GLUCOSE MONITORING, DIETARY RESTRICTIONS INCLUDING AVOIDANCE OF SUGARY DRINKS/PROCESSED FOODS,  ALONG WITH REGULAR EXERCISE.  I  ALSO STRESSED THE IMPORTANCE OF ANNUAL EYE EXAMS, SELF FOOT CARE AND COMPLIANCE WITH OFFICE VISITS. - Hemoglobin A1c; Future - CBC; Future - BMP8+EGFR; Future - BMP8+EGFR - CBC - Hemoglobin A1c  3. Essential hypertension Comments: Chronic, well controlled. She will c/w current meds. Encouraged to follow low sodium diet. EKG performed, NSR w/o acute changes.  She will f/u in 6 months.  - EKG 12-Lead  4. Postoperative primary hypothyroidism Comments: I will check thyroid panel and adjust meds as needed.  - TSH; Future - T4, free; Future - T4, free - TSH  5. Impacted cerumen of right ear Comments: Declines irrigation today, she has time constraints.  She prefers to return for another office visit.   6. Pure hypercholesterolemia Chronic. Importance of statin compliance was discussed with the patient. Also advised to avoid fried foods and to incorporate more exercise into her daily routine.  - TSH; Future - TSH  7. Atherosclerosis of aorta ([facility]) Comments: Chronic. She is encouraged to follow heart healthy lifestyle. Importance of statin compliance and regular exercise was d/w patient in detail.   8. Overweight (BMI 25.0-29.9) Comments: BMI 29. Her BMI is acceptable for her demographic. Encouraged to work up to 150 minutes of exercise per week.   Patient was given opportunity to ask questions. Patient verbalized understanding of the plan and was able to repeat key elements of the plan. All questions were answered to their satisfaction.  [name], MD   I, [name], MD, have  reviewed all documentation for this visit. The documentation on 06/22/20 for the exam, diagnosis, procedures, and orders are all accurate and complete.  THE PATIENT IS ENCOURAGED TO PRACTICE SOCIAL DISTANCING DUE TO THE COVID-19 PANDEMIC.

## 2020-06-11 NOTE — Patient Instructions (Signed)
Health Maintenance, Female Adopting a healthy lifestyle and getting preventive care are important in promoting health and wellness. Ask your health care provider about:  The right schedule for you to have regular tests and exams.  Things you can do on your own to prevent diseases and keep yourself healthy. What should I know about diet, weight, and exercise? Eat a healthy diet   Eat a diet that includes plenty of vegetables, fruits, low-fat dairy products, and lean protein.  Do not eat a lot of foods that are high in solid fats, added sugars, or sodium. Maintain a healthy weight Body mass index (BMI) is used to identify weight problems. It estimates body fat based on height and weight. Your health care provider can help determine your BMI and help you achieve or maintain a healthy weight. Get regular exercise Get regular exercise. This is one of the most important things you can do for your health. Most adults should:  Exercise for at least 150 minutes each week. The exercise should increase your heart rate and make you sweat (moderate-intensity exercise).  Do strengthening exercises at least twice a week. This is in addition to the moderate-intensity exercise.  Spend less time sitting. Even light physical activity can be beneficial. Watch cholesterol and blood lipids Have your blood tested for lipids and cholesterol at 71 years of age, then have this test every 5 years. Have your cholesterol levels checked more often if:  Your lipid or cholesterol levels are high.  You are older than 71 years of age.  You are at high risk for heart disease. What should I know about cancer screening? Depending on your health history and family history, you may need to have cancer screening at various ages. This may include screening for:  Breast cancer.  Cervical cancer.  Colorectal cancer.  Skin cancer.  Lung cancer. What should I know about heart disease, diabetes, and high blood  pressure? Blood pressure and heart disease  High blood pressure causes heart disease and increases the risk of stroke. This is more likely to develop in people who have high blood pressure readings, are of African descent, or are overweight.  Have your blood pressure checked: ? Every 3-5 years if you are 18-39 years of age. ? Every year if you are 40 years old or older. Diabetes Have regular diabetes screenings. This checks your fasting blood sugar level. Have the screening done:  Once every three years after age 40 if you are at a normal weight and have a low risk for diabetes.  More often and at a younger age if you are overweight or have a high risk for diabetes. What should I know about preventing infection? Hepatitis B If you have a higher risk for hepatitis B, you should be screened for this virus. Talk with your health care provider to find out if you are at risk for hepatitis B infection. Hepatitis C Testing is recommended for:  Everyone born from 1945 through 1965.  Anyone with known risk factors for hepatitis C. Sexually transmitted infections (STIs)  Get screened for STIs, including gonorrhea and chlamydia, if: ? You are sexually active and are younger than 71 years of age. ? You are older than 71 years of age and your health care provider tells you that you are at risk for this type of infection. ? Your sexual activity has changed since you were last screened, and you are at increased risk for chlamydia or gonorrhea. Ask your health care provider if   you are at risk.  Ask your health care provider about whether you are at high risk for HIV. Your health care provider may recommend a prescription medicine to help prevent HIV infection. If you choose to take medicine to prevent HIV, you should first get tested for HIV. You should then be tested every 3 months for as long as you are taking the medicine. Pregnancy  If you are about to stop having your period (premenopausal) and  you may become pregnant, seek counseling before you get pregnant.  Take 400 to 800 micrograms (mcg) of folic acid every day if you become pregnant.  Ask for birth control (contraception) if you want to prevent pregnancy. Osteoporosis and menopause Osteoporosis is a disease in which the bones lose minerals and strength with aging. This can result in bone fractures. If you are 65 years old or older, or if you are at risk for osteoporosis and fractures, ask your health care provider if you should:  Be screened for bone loss.  Take a calcium or vitamin D supplement to lower your risk of fractures.  Be given hormone replacement therapy (HRT) to treat symptoms of menopause. Follow these instructions at home: Lifestyle  Do not use any products that contain nicotine or tobacco, such as cigarettes, e-cigarettes, and chewing tobacco. If you need help quitting, ask your health care provider.  Do not use street drugs.  Do not share needles.  Ask your health care provider for help if you need support or information about quitting drugs. Alcohol use  Do not drink alcohol if: ? Your health care provider tells you not to drink. ? You are pregnant, may be pregnant, or are planning to become pregnant.  If you drink alcohol: ? Limit how much you use to 0-1 drink a day. ? Limit intake if you are breastfeeding.  Be aware of how much alcohol is in your drink. In the [place] one drink equals one 12 oz bottle of beer (355 mL), one 5 oz glass of wine (148 mL), or one 1 oz glass of hard liquor (44 mL). General instructions  Schedule regular health, dental, and eye exams.  Stay current with your vaccines.  Tell your health care provider if: ? You often feel depressed. ? You have ever been abused or do not feel safe at home. Summary  Adopting a healthy lifestyle and getting preventive care are important in promoting health and wellness.  Follow your health care provider's instructions about healthy  diet, exercising, and getting tested or screened for diseases.  Follow your health care provider's instructions on monitoring your cholesterol and blood pressure. This information is not intended to replace advice given to you by your health care provider. Make sure you discuss any questions you have with your health care provider. Document Revised: 06/07/2018 Document Reviewed: 06/07/2018 Elsevier Patient Education  2020 Elsevier Inc.  

## 2020-06-17 ENCOUNTER — Other Ambulatory Visit: Payer: Medicare PPO

## 2020-06-19 ENCOUNTER — Other Ambulatory Visit: Payer: Self-pay | Admitting: Internal Medicine

## 2020-06-19 ENCOUNTER — Other Ambulatory Visit: Payer: Medicare PPO

## 2020-06-19 ENCOUNTER — Ambulatory Visit (INDEPENDENT_AMBULATORY_CARE_PROVIDER_SITE_OTHER): Payer: Medicare PPO | Admitting: Nurse Practitioner

## 2020-06-19 ENCOUNTER — Other Ambulatory Visit: Payer: Self-pay

## 2020-06-19 ENCOUNTER — Encounter: Payer: Self-pay | Admitting: Nurse Practitioner

## 2020-06-19 VITALS — BP 124/62 | HR 99 | Temp 98.3°F | Ht 64.8 in | Wt 174.6 lb

## 2020-06-19 DIAGNOSIS — E1165 Type 2 diabetes mellitus with hyperglycemia: Secondary | ICD-10-CM | POA: Diagnosis not present

## 2020-06-19 DIAGNOSIS — H6121 Impacted cerumen, right ear: Secondary | ICD-10-CM | POA: Diagnosis not present

## 2020-06-19 DIAGNOSIS — E039 Hypothyroidism, unspecified: Secondary | ICD-10-CM | POA: Diagnosis not present

## 2020-06-19 DIAGNOSIS — G8929 Other chronic pain: Secondary | ICD-10-CM | POA: Diagnosis not present

## 2020-06-19 DIAGNOSIS — M545 Low back pain, unspecified: Secondary | ICD-10-CM

## 2020-06-19 DIAGNOSIS — E7800 Pure hypercholesterolemia, unspecified: Secondary | ICD-10-CM | POA: Diagnosis not present

## 2020-06-19 NOTE — Progress Notes (Signed)
This visit occurred during the SARS-CoV-2 public health emergency.  Safety protocols were in place, including screening questions prior to the visit, additional usage of staff PPE, and extensive cleaning of exam room while observing appropriate contact time as indicated for disinfecting solutions.  Subjective:     Patient ID: Christina Hernandez , female    DOB: 1950-12-04 , 69 y.o.   MRN: [id number]   Chief Complaint  Patient presents with  . Ear Fullness    HPI  Patient is here for ear discomfort. She has had impacted cerumem of the ear before and she has noticed that her hearing is impaired on that side. Additionally the patient presents with lower back pain that has been bothering her for a while now. She has tried heat/ice without much relief. She would like to strengthen her back muscles by going to physical therapy and requests a referral to physical therapy.  Back Pain This is a chronic problem. The current episode started more than 1 year ago. The problem occurs daily. The problem has been gradually worsening since onset. The pain is present in the lumbar spine. The pain does not radiate. The pain is moderate. The pain is worse during the night. The symptoms are aggravated by lying down. Stiffness is present in the morning. Pertinent negatives include no chest pain, numbness or tingling. She has tried heat, home exercises, ice and bed rest for the symptoms.     Past Medical History:  Diagnosis Date  . Arthritis   . Breast cancer, right breast [facility])    pending right breast lumpectomy on September 01, 2018  . Diabetes mellitus type II, controlled ([place])    On Kombiglyze  . Family history of ovarian cancer   . Family history of thyroid cancer   . GERD (gastroesophageal reflux disease)    rarely occurs- no meds  . Hyperlipidemia associated with type 2 diabetes mellitus ([place])   . Hypertension   . Hypothyroidism   . Personal history of radiation therapy   . Primary localized  osteoarthritis of right knee 04/17/2019  . S/P knee replacement 04/17/2019  . Trigeminal neuralgia of left side of face 2015   s/p Gamma knife     Family History  Problem Relation Age of Onset  . Hypertension Mother   . Kidney failure Mother   . Congestive Heart Failure Mother   . Heart Problems Father   . Stroke Father   . Pneumonia Father   . Heart Problems Sister 40       heart transplant  . Stroke Paternal Grandmother   . Dementia Paternal Grandmother   . Stroke Paternal Grandfather   . Cardiomyopathy Sister   . Ovarian cancer Sister 75       d. 34  . Thyroid disease Sister   . Thyroid disease Sister   . Thyroid cancer Niece 12  . Breast cancer Neg Hx      Current Outpatient Medications:  .  Accu-Chek FastClix Lancets MISC, Use as instructed to check blood sugars 1 time per day dx: e11.22, Disp: 150 each, Rfl: 2 .  anastrozole (ARIMIDEX) 1 MG tablet, TAKE 1 TABLET (1 MG TOTAL) BY MOUTH DAILY. START Apr 29, 2019, Disp: 90 tablet, Rfl: 4 .  baclofen (LIORESAL) 10 MG tablet, TAKE 1 TABLET BY MOUTH THREE TIMES A DAY AS NEEDED FOR MUSCLE SPASMS, Disp: 90 tablet, Rfl: 1 .  Blood Glucose Monitoring Suppl (ACCU-CHEK GUIDE) w/Device KIT, 1 each by Does not apply route daily. Dx:  e11.22, Disp: 1 kit, Rfl: 1 .  cetirizine (ZYRTEC) 10 MG tablet, Take 1 tablet (10 mg total) by mouth daily., Disp: 30 tablet, Rfl: 3 .  Cholecalciferol (VITAMIN D) 125 MCG (5000 UT) CAPS, Take 5,000 Units by mouth daily., Disp: , Rfl:  .  COMBIGAN 0.2-0.5 % ophthalmic solution, Place 1 drop into both eyes 2 (two) times daily., Disp: , Rfl:  .  DULoxetine (CYMBALTA) 60 MG capsule, Take 1 capsule (60 mg total) by mouth daily., Disp: 90 capsule, Rfl: 4 .  gabapentin (NEURONTIN) 300 MG capsule, Take 3 capsules (900 mg total) by mouth 3 (three) times daily., Disp: 810 capsule, Rfl: 3 .  glucose blood (ACCU-CHEK GUIDE) test strip, Use as instructed to check blood sugars 1 time per day dx: e11.22, Disp: 150 each,  Rfl: 2 .  KOMBIGLYZE XR 5-500 MG TB24, TAKE 1 TABLET BY MOUTH DAILY BEFORE SUPPER., Disp: 90 tablet, Rfl: 1 .  latanoprost (XALATAN) 0.005 % ophthalmic solution, Place 1 drop into both eyes at bedtime., Disp: , Rfl:  .  losartan-hydrochlorothiazide (HYZAAR) 50-12.5 MG tablet, TAKE 1 TABLET BY MOUTH EVERY DAY, Disp: 90 tablet, Rfl: 1 .  ondansetron (ZOFRAN) 4 MG tablet, Take 1 tablet (4 mg total) by mouth every 8 (eight) hours as needed for nausea or vomiting. (Patient not taking: Reported on 06/11/2020), Disp: 10 tablet, Rfl: 0 .  pravastatin (PRAVACHOL) 80 MG tablet, TAKE ONE TAB PO M-F, SKIP SAT/SUN, Disp: 75 tablet, Rfl: 2 .  SYNTHROID 75 MCG tablet, TAKE 75 MCG BY MOUTH DAILY MONDAY - SATURDAY AND HOLD ON SUNDAY, Disp: 90 tablet, Rfl: 1   Allergies  Allergen Reactions  . Cyclobenzaprine Nausea Only     Review of Systems  HENT: Positive for hearing loss.   Respiratory: Negative for shortness of breath and wheezing.   Cardiovascular: Negative for chest pain, palpitations and leg swelling.  Musculoskeletal: Positive for back pain. Negative for gait problem and joint swelling.  Neurological: Negative for tingling and numbness.     Today's Vitals   06/19/20 1357  BP: 124/62  Pulse: 99  Temp: 98.3 F (36.8 C)  TempSrc: Oral  Weight: 174 lb 9.6 oz (79.2 kg)  Height: 5' 4.8" (1.646 m)   Body mass index is 29.23 kg/m.   Objective:  Physical Exam Constitutional:      Appearance: Normal appearance. She is obese.  HENT:     Right Ear: External ear normal. There is impacted cerumen.     Left Ear: Tympanic membrane and ear canal normal.  Cardiovascular:     Rate and Rhythm: Normal rate and regular rhythm.     Pulses: Normal pulses.     Heart sounds: Normal heart sounds.  Pulmonary:     Effort: Pulmonary effort is normal.     Breath sounds: No wheezing or rales.  Chest:     Chest wall: No tenderness.  Musculoskeletal:     Lumbar back: Tenderness present. No swelling.      Comments: Pain upon palpitation of lower back.   Neurological:     Mental Status: She is alert.         Assessment And Plan:     1. Impacted cerumen of right ear -Right ear was impacted with significant amount of cerumen built up  -Ear lavage with curette in the clinic performed   2. Chronic midline low back pain without sciatica -Educated patient about exercises to do at home to strengthen her back muscles.  -Educated patient about  using alter.between  Ice and cold to help with the pain  -Discussed the use of OTC pain relievers as needed   -Discussed if she would like a referral for PT  - Referral sent to PT   Patient was given opportunity to ask questions. Patient verbalized understanding of the plan and was able to repeat key elements of the plan. All questions were answered to their satisfaction.  Bary Castilla, NP   I, Bary Castilla, NP, have reviewed all documentation for this visit. The documentation on 06/19/20 for the exam, diagnosis, procedures, and orders are all accurate and complete.  THE PATIENT IS ENCOURAGED TO PRACTICE SOCIAL DISTANCING DUE TO THE COVID-19 PANDEMIC.

## 2020-06-19 NOTE — Patient Instructions (Signed)
Earwax Buildup, Adult The ears produce a substance called earwax that helps keep bacteria out of the ear and protects the skin in the ear canal. Occasionally, earwax can build up in the ear and cause discomfort or hearing loss. What increases the risk? This condition is more likely to develop in people who:  Are female.  Are elderly.  Naturally produce more earwax.  Clean their ears often with cotton swabs.  Use earplugs often.  Use in-ear headphones often.  Wear hearing aids.  Have narrow ear canals.  Have earwax that is overly thick or sticky.  Have eczema.  Are dehydrated.  Have excess hair in the ear canal. What are the signs or symptoms? Symptoms of this condition include:  Reduced or muffled hearing.  A feeling of fullness in the ear or feeling that the ear is plugged.  Fluid coming from the ear.  Ear pain.  Ear itch.  Ringing in the ear.  Coughing.  An obvious piece of earwax that can be seen inside the ear canal. How is this diagnosed? This condition may be diagnosed based on:  Your symptoms.  Your medical history.  An ear exam. During the exam, your health care provider will look into your ear with an instrument called an otoscope. You may have tests, including a hearing test. How is this treated? This condition may be treated by:  Using ear drops to soften the earwax.  Having the earwax removed by a health care provider. The health care provider may: ? Flush the ear with water. ? Use an instrument that has a loop on the end (curette). ? Use a suction device.  Surgery to remove the wax buildup. This may be done in severe cases. Follow these instructions at home:   Take over-the-counter and prescription medicines only as told by your health care provider.  Do not put any objects, including cotton swabs, into your ear. You can clean the opening of your ear canal with a washcloth or facial tissue.  Follow instructions from your health care  provider about cleaning your ears. Do not over-clean your ears.  Drink enough fluid to keep your urine clear or pale yellow. This will help to thin the earwax.  Keep all follow-up visits as told by your health care provider. If earwax builds up in your ears often or if you use hearing aids, consider seeing your health care provider for routine, preventive ear cleanings. Ask your health care provider how often you should schedule your cleanings.  If you have hearing aids, clean them according to instructions from the manufacturer and your health care provider. Contact a health care provider if:  You have ear pain.  You develop a fever.  You have blood, pus, or other fluid coming from your ear.  You have hearing loss.  You have ringing in your ears that does not go away.  Your symptoms do not improve with treatment.  You feel like the room is spinning (vertigo). Summary  Earwax can build up in the ear and cause discomfort or hearing loss.  The most common symptoms of this condition include reduced or muffled hearing and a feeling of fullness in the ear or feeling that the ear is plugged.  This condition may be diagnosed based on your symptoms, your medical history, and an ear exam.  This condition may be treated by using ear drops to soften the earwax or by having the earwax removed by a health care provider.  Do not put any   objects, including cotton swabs, into your ear. You can clean the opening of your ear canal with a washcloth or facial tissue. This information is not intended to replace advice given to you by your health care provider. Make sure you discuss any questions you have with your health care provider. Document Revised: 05/27/2017 Document Reviewed: 08/25/2016 Elsevier Patient Education  2020 Elsevier Inc.  

## 2020-06-20 LAB — CBC
Hematocrit: 38.5 % (ref 34.0–46.6)
Hemoglobin: 12.7 g/dL (ref 11.1–15.9)
MCH: 27.1 pg (ref 26.6–33.0)
MCHC: 33 g/dL (ref 31.5–35.7)
MCV: 82 fL (ref 79–97)
Platelets: 295 10*3/uL (ref 150–450)
RBC: 4.68 x10E6/uL (ref 3.77–5.28)
RDW: 13.4 % (ref 11.7–15.4)
WBC: 5.9 10*3/uL (ref 3.4–10.8)

## 2020-06-20 LAB — BMP8+EGFR
BUN/Creatinine Ratio: 26 (ref 12–28)
BUN: 16 mg/dL (ref 8–27)
CO2: 25 mmol/L (ref 20–29)
Calcium: 9.7 mg/dL (ref 8.7–10.3)
Chloride: 102 mmol/L (ref 96–106)
Creatinine, Ser: 0.61 mg/dL (ref 0.57–1.00)
GFR calc Af Amer: 105 mL/min/{1.73_m2} (ref >59)
GFR calc non Af Amer: 92 mL/min/{1.73_m2} (ref >59)
Glucose: 118 mg/dL — ABNORMAL HIGH (ref 65–99)
Potassium: 3.8 mmol/L (ref 3.5–5.2)
Sodium: 143 mmol/L (ref 134–144)

## 2020-06-20 LAB — T4, FREE: Free T4: 1.28 ng/dL (ref 0.82–1.77)

## 2020-06-20 LAB — HEMOGLOBIN A1C
Est. average glucose Bld gHb Est-mCnc: 128 mg/dL
Hgb A1c MFr Bld: 6.1 % — ABNORMAL HIGH (ref 4.8–5.6)

## 2020-06-20 LAB — TSH: TSH: 0.147 u[IU]/mL — ABNORMAL LOW (ref 0.450–4.500)

## 2020-06-22 DIAGNOSIS — I700 Atherosclerosis of aorta: Secondary | ICD-10-CM | POA: Insufficient documentation

## 2020-06-22 DIAGNOSIS — H6121 Impacted cerumen, right ear: Secondary | ICD-10-CM | POA: Insufficient documentation

## 2020-06-23 ENCOUNTER — Telehealth: Payer: Self-pay

## 2020-06-23 NOTE — Telephone Encounter (Signed)
-----   Message from Dorothyann Peng, MD sent at 06/22/2020 11:50 PM EST ----- Your thyroid meds need to be adjusted. I want to decrease your dose to M-F and on Sat/Sunday. You may get samples of the dose if available. If so, someone will get you two boxes to start with. Please call in two weeks to get more samples.   Your hba1c is 6.1, pretty good. Please continue with current meds. Please let me know if you have questions regarding thyroid meds.

## 2020-06-23 NOTE — Telephone Encounter (Signed)
Unable to leave a message. No voicemail on home phone and cell voicemail not setup.

## 2020-06-25 ENCOUNTER — Telehealth: Payer: Self-pay

## 2020-06-25 NOTE — Telephone Encounter (Signed)
I returned the pt's call to see if her daughter picked up the synthroid samples the pt requested.  No voicemail on home phone and cell voicemail isn't setup.

## 2020-06-30 ENCOUNTER — Ambulatory Visit: Payer: Medicare PPO | Admitting: Rehabilitative and Restorative Service Providers"

## 2020-07-09 DIAGNOSIS — Z20822 Contact with and (suspected) exposure to covid-19: Secondary | ICD-10-CM | POA: Diagnosis not present

## 2020-07-10 DIAGNOSIS — G500 Trigeminal neuralgia: Secondary | ICD-10-CM | POA: Diagnosis not present

## 2020-07-11 ENCOUNTER — Ambulatory Visit: Payer: Medicare PPO | Admitting: Physical Therapy

## 2020-07-11 ENCOUNTER — Encounter: Payer: Self-pay | Admitting: Physical Therapy

## 2020-07-11 ENCOUNTER — Other Ambulatory Visit: Payer: Self-pay

## 2020-07-11 DIAGNOSIS — M25551 Pain in right hip: Secondary | ICD-10-CM

## 2020-07-11 DIAGNOSIS — M545 Low back pain, unspecified: Secondary | ICD-10-CM

## 2020-07-11 DIAGNOSIS — M6281 Muscle weakness (generalized): Secondary | ICD-10-CM

## 2020-07-11 DIAGNOSIS — M25552 Pain in left hip: Secondary | ICD-10-CM | POA: Diagnosis not present

## 2020-07-11 DIAGNOSIS — G8929 Other chronic pain: Secondary | ICD-10-CM

## 2020-07-11 NOTE — Patient Instructions (Signed)
Access Code: [email or web address] URL: [email or web address] Date: 07/11/2020 Prepared by: Elsie Ra  Exercises Supine Lower Trunk Rotation - 2 x daily - 6 x weekly - 1 sets - 10 reps - 5 sec hold Supine March - 2 x daily - 6 x weekly - 1-2 sets - 10 reps - 3 hold Static Prone on Elbows - 2 x daily - 6 x weekly - 3 sets - 3 reps - 20 sec hold Anti-Rotation Lateral Stepping with Press - 2 x daily - 6 x weekly - 1 sets - 10 reps Seated Bilateral Shoulder Flexion Towel Slide at Table Top - 2 x daily - 6 x weekly - 2 sets - 10 reps - 3 hold

## 2020-07-11 NOTE — Therapy (Signed)
[facility] Physical Therapy [address] [place], [place], [address] Phone: [phone]   Fax:  [phone]  Physical Therapy Evaluation  Patient Details  Name: Christina Hernandez MRN: [id number] Date of Birth: [birth year] Referring Provider (PT): Bary Castilla, NP   Encounter Date: 07/11/2020   PT End of Session - 07/11/20 1139    Visit Number 1    Number of Visits 12    Date for PT Re-Evaluation 08/22/20    Authorization Type [employer]    PT Start Time 1017    PT Stop Time 1106    PT Time Calculation (min) 49 min    Activity Tolerance Patient tolerated treatment well;No increased pain           Past Medical History:  Diagnosis Date  . Arthritis   . Breast cancer, right breast [facility])    pending right breast lumpectomy on September 01, 2018  . Diabetes mellitus type II, controlled ([place])    On Kombiglyze  . Family history of ovarian cancer   . Family history of thyroid cancer   . GERD (gastroesophageal reflux disease)    rarely occurs- no meds  . Hyperlipidemia associated with type 2 diabetes mellitus ([place])   . Hypertension   . Hypothyroidism   . Personal history of radiation therapy   . Primary localized osteoarthritis of right knee 04/17/2019  . S/P knee replacement 04/17/2019  . Trigeminal neuralgia of left side of face 2015   s/p Gamma knife    Past Surgical History:  Procedure Laterality Date  . BREAST BIOPSY Right 07/24/2018  . BREAST LUMPECTOMY Right 09/01/2018  . BREAST LUMPECTOMY WITH RADIOACTIVE SEED LOCALIZATION Right 09/01/2018   Procedure: RIGHT BREAST LUMPECTOMY WITH RADIOACTIVE SEED LOCALIZATION;  Surgeon: [name], MD;  Location: [place];  Service: General;  Laterality: Right;  . COLONOSCOPY    . Gamma Knife Left 04/08/2016  . HYSTEROSCOPY WITH D & C  05/24/2011   Procedure: DILATATION AND CURETTAGE (D&C) /HYSTEROSCOPY;  Surgeon: [name], MD;  Location: [facility];  Service: Gynecology;  Laterality:  N/A;  . KNEE SURGERY Right 06/10/15   torn ligament  . PARTIAL KNEE ARTHROPLASTY Right 04/17/2019   Procedure: UNICOMPARTMENTAL KNEE;  Surgeon: [name], MD;  Location: [facility];  Service: Orthopedics;  Laterality: Right;  . THYROIDECTOMY N/A 09/08/2016   Procedure: TOTAL THYROIDECTOMY;  Surgeon: [name], MD;  Location: [place];  Service: ENT;  Laterality: N/A;  . TUBAL LIGATION    . uterine polyp    . WISDOM TOOTH EXTRACTION Left 2014    There were no vitals filed for this visit.    Subjective Assessment - 07/11/20 1025    Subjective Patient has concerns of Low back pain that has been bothering her for the past 6-9 months off and on. Patient describes pain as a dull ache that comes and goes around her belt  region. She notes that pain is worse in the morning and can wake her in the middle of the night but cannot think of anything that makes it worse besides lying down. Denies N/T B/B or any radiating symptoms. She states she doesn't have any limitations in activity because of covid she isn't doing much. Patient does have stairs in her house and lives independently.She notes medication changes from Dr. visit yesterday for trigeminal neuralgia- lamotrigine 25 mg, and lidocane.    Pertinent History hyperlipidemia, HTN, hypothyroidism, DM 2, History Rt beast cancer    Patient Stated Goals Strengthen core and back,  become painfree    Currently in Pain? Yes    Pain Score 1     Pain Location Back    Pain Orientation Lower    Pain Descriptors / Indicators Aching    Pain Type Chronic pain    Pain Radiating Towards belt region    Pain Onset More than a month ago    Pain Frequency Intermittent    Aggravating Factors  lying down, stairs    Pain Relieving Factors ice/heat    Effect of Pain on Daily Activities no leisure activities or hobbies to report              [facility] PT Assessment - 07/11/20 0001      Assessment   Medical Diagnosis Chronic Midline low back pain M.54.50,G89.29     Referring Provider (PT) Bary Castilla, NP    Onset Date/Surgical Date 10/10/19      Precautions   Precautions None      Restrictions   Weight Bearing Restrictions No      Balance Screen   Has the patient fallen in the past 6 months No    Has the patient had a decrease in activity level because of a fear of falling?  No    Is the patient reluctant to leave their home because of a fear of falling?  No      Home Environment   Living Environment Private residence    Living Arrangements Alone    Type of [place] Two level;Bed/bath upstairs    Alternate Level Stairs-Number of Steps 20 steps to second level      Prior Function   Level of Independence Independent    Vocation Retired      Associate Professor   Overall Cognitive Status Within Functional Limits for tasks assessed    Attention Focused    Memory Appears intact   Seems to have slight difficulty answering questions about pain details(how long/where/when) and states she cannot remember     Observation/Other Assessments   Observations forward head rounded shoulder posture, hips level    Focus on Therapeutic Outcomes (FOTO)  56% functional, goal is 65%      ROM / Strength   AROM / PROM / Strength AROM;Strength      AROM   AROM Assessment Site Lumbar    Lumbar Flexion 50%    Lumbar Extension 25%    Lumbar - Right Side Bend 50%    Lumbar - Left Side Bend 50%    Lumbar - Right Rotation 50%    Lumbar - Left Rotation 25%      PROM   PROM Assessment Site Lumbar      Strength   Strength Assessment Site Hip    Right/Left Hip Right;Left    Right Hip Flexion 5/5    Right Hip Extension 4/5    Right Hip ABduction 3/5    Left Hip Flexion 5/5    Left Hip Extension 4+/5    Left Hip ABduction 3/5      Palpation   Spinal mobility tight and tender paraspinals, at L4-5 had referred pain into her Lt hip with central PA mobs also mild hypomobility at these segments      Ambulation/Gait   Gait Pattern Decreased  hip/knee flexion - left;Decreased hip/knee flexion - right;Antalgic;Left circumduction;Right circumduction    Gait Comments patient has slightly uncoordinated gait, possibly leg length discrepancy with slight circumduction bilaterally  Objective measurements completed on examination: See above findings.       [facility] Adult PT Treatment/Exercise - 07/11/20 0001      Manual Therapy   Manual Therapy Soft tissue mobilization    Manual therapy comments 10 mins STM to low back paraspinals                  PT Education - 07/11/20 1138    Education Details Educated patient on HEP and POC    Person(s) Educated Patient    Methods Explanation;Demonstration;Tactile cues;Verbal cues;Handout    Comprehension Verbalized understanding;Returned demonstration            PT Short Term Goals - 07/11/20 1148      PT SHORT TERM GOAL #1   Title Patient will be independent with HEP for improved strength, motion, and function.    Baseline HEP created 07/11/20    Time 3    Period Weeks    Status New    Target Date 08/01/20      PT SHORT TERM GOAL #2   Title Will test patient balance and sest goals appropriately for increased stability and coordination    Baseline gait assessment    Time 2    Period Weeks    Status New    Target Date 07/25/20             PT Long Term Goals - 07/11/20 1150      PT LONG TERM GOAL #1   Title Patient will demonstrate the ability to climb and descend stairs with confidence at least 2x a day for increased autonomy    Baseline reports difficulty with stairs at times, sometimes sleeps downstairs    Time 6    Period Weeks    Status New    Target Date 08/22/20      PT LONG TERM GOAL #2   Title Patient will demonstrate increased strength in hip abduction and hip extension bilaterally for improved function    Baseline hip abduction3/5 hip ext 4/5    Time 6    Period Weeks    Status New    Target Date 08/22/20      PT  LONG TERM GOAL #3   Title Patient will decrease daily pain to 2/10 as baseline for increased ability for ADLs and leisure activities    Baseline pain is resting at 1 currently but can get wrose at night/morning    Time 6    Period Weeks    Status New    Target Date 08/22/20      PT LONG TERM GOAL #4   Title Patient willimprove FOTO functional score to 65%    Baseline 56%    Time 6    Period Weeks    Status New    Target Date 08/22/20                  Plan - 07/11/20 1056    Clinical Impression Statement Patient tolerated tx well today verbalizing that she felt better and could stand taller after soft tissue mobilization and reviewing HEP. She seemed willing and open to trying new things to decrease her pain and become stronger. She will benefit from skilled PT with stretching to increase her limited ROM for decreased pain and gentle strengthening for core/hip/back for increased function.    Personal Factors and Comorbidities Comorbidity 2;Comorbidity 1    Comorbidities hyperlipidemia, HTN, hypothyroidism, DM 2, History Rt beast cancer    Examination-Activity Limitations Sleep;Stairs;Other  Stability/Clinical Decision Making Stable/Uncomplicated    Clinical Decision Making Low    Rehab Potential Fair    PT Frequency 2x / week    PT Duration 6 weeks    PT Treatment/Interventions ADLs/Self Care Home Management;Gait training;Stair training;Functional mobility training;Therapeutic activities;Therapeutic exercise;Balance training;Neuromuscular re-education;Electrical Stimulation;Cryotherapy;Biofeedback;Moist Heat;Passive range of motion;Manual techniques;Vasopneumatic Device;Taping    PT Next Visit Plan gait analysis, address balance deficits, progress hip strengtening and core strength exercises, check in with HEP    PT Home Exercise Plan Access Code: [email or web address]    Consulted and Agree with Plan of Care Patient           Patient will benefit from skilled therapeutic  intervention in order to improve the following deficits and impairments:  Abnormal gait,Decreased activity tolerance,Difficulty walking,Decreased range of motion,Decreased coordination,Decreased endurance,Pain,Decreased balance,Decreased mobility,Decreased strength,Impaired flexibility  Visit Diagnosis: Chronic midline low back pain without sciatica  Muscle weakness (generalized)  Pain in left hip  Pain in right hip     Problem List Patient Active Problem List   Diagnosis Date Noted  . Atherosclerosis of aorta ([place]) 06/22/2020  . Impacted cerumen of right ear 06/22/2020  . Malignant neoplasm of nipple of right breast in female, unspecified estrogen receptor status ([place]) 03/12/2020  . Primary localized osteoarthritis of right knee 04/17/2019  . Genetic testing 08/28/2018  . Pre-operative cardiovascular examination 08/24/2018  . Nonspecific abnormal electrocardiogram (ECG) (EKG) 08/24/2018  . Family history of ovarian cancer   . Family history of thyroid cancer   . Ductal carcinoma in situ (DCIS) of right breast 07/27/2018  . Pure hypercholesterolemia 05/10/2018  . Uncontrolled type 2 diabetes mellitus with hyperglycemia ([place]) 05/10/2018  . Essential hypertension 05/10/2018  . Bilateral primary osteoarthritis of knee 05/10/2018  . Hypothyroidism 05/10/2018  . H/O total thyroidectomy 09/08/2016  . Trigeminal neuralgia of left side of face 04/26/2014    Glenetta Hew, SPT 07/11/2020, 12:18 PM  [facility] Physical Therapy [address] [place], [place], [address] Phone: [phone]   Fax:  [phone]  Name: Christina Hernandez MRN: [id number] Date of Birth: 1950-12-09  Referring diagnosis? M54.50 Treatment diagnosis? Same M54.5 What was this (referring dx) caused by? []  Surgery []  Fall [x]  Ongoing issue [x]  Arthritis []  Other: ____________  Laterality: []  Rt []  Lt [x]  Both  Check all possible CPT codes:      [x]  97110 (Therapeutic  Exercise)  []  92507 (SLP Treatment)  [x]  97112 (Neuro Re-ed)   []  92526 (Swallowing Treatment)   [x]  97116 (Gait Training)   []  [id number] (Cognitive Training, 1st 15 minutes) [x]  97140 (Manual Therapy)   []  02725 (Cognitive Training, each add'l 15 minutes)  [x]  97530 (Therapeutic Activities)  []  Other, List CPT Code ____________    [x]  36644 (Self Care)       []  All codes above (97110 - 97535)  [x]  97012 (Mechanical Traction)  [x]  97014 (E-stim Unattended)  []  97032 (E-stim manual)  [x]  97033 (Ionto)  [x]  97035 (Ultrasound)  []  97760 (Orthotic Fit) []  97750 (Physical Performance Training) []  [id number] (Aquatic Therapy) []  97034 (Contrast Bath) []  [id number] (Paraffin) []  97597 (Wound Care 1st 20 sq cm) []  97598 (Wound Care each add'l 20 sq cm) []  97016 (Vasopneumatic Device) []  [id number] Comptroller) []  [id number] (Prosthetic Training)

## 2020-07-12 ENCOUNTER — Other Ambulatory Visit: Payer: Self-pay | Admitting: Internal Medicine

## 2020-07-18 ENCOUNTER — Ambulatory Visit: Payer: Medicare PPO | Admitting: Physical Therapy

## 2020-07-18 ENCOUNTER — Other Ambulatory Visit: Payer: Self-pay

## 2020-07-18 DIAGNOSIS — M25552 Pain in left hip: Secondary | ICD-10-CM | POA: Diagnosis not present

## 2020-07-18 DIAGNOSIS — M545 Low back pain, unspecified: Secondary | ICD-10-CM

## 2020-07-18 DIAGNOSIS — G8929 Other chronic pain: Secondary | ICD-10-CM | POA: Diagnosis not present

## 2020-07-18 DIAGNOSIS — M25551 Pain in right hip: Secondary | ICD-10-CM

## 2020-07-18 DIAGNOSIS — M6281 Muscle weakness (generalized): Secondary | ICD-10-CM | POA: Diagnosis not present

## 2020-07-18 NOTE — Therapy (Signed)
[facility] Physical Therapy [address] [place], [place], [address] Phone: [phone]   Fax:  [phone]  Physical Therapy Treatment  Patient Details  Name: Christina Hernandez MRN: [id number] Date of Birth: [birth year] Referring Provider (PT): Charlesetta Ivory, NP   Encounter Date: 07/18/2020   PT End of Session - 07/18/20 1435    Visit Number 2    Number of Visits 12    Date for PT Re-Evaluation 08/22/20    Authorization Type [employer]    PT Start Time 1345    PT Stop Time 1430    PT Time Calculation (min) 45 min    Activity Tolerance Patient tolerated treatment well;No increased pain           Past Medical History:  Diagnosis Date  . Arthritis   . Breast cancer, right breast [facility])    pending right breast lumpectomy on September 01, 2018  . Diabetes mellitus type II, controlled ([facility])    On Kombiglyze  . Family history of ovarian cancer   . Family history of thyroid cancer   . GERD (gastroesophageal reflux disease)    rarely occurs- no meds  . Hyperlipidemia associated with type 2 diabetes mellitus ([facility])   . Hypertension   . Hypothyroidism   . Personal history of radiation therapy   . Primary localized osteoarthritis of right knee 04/17/2019  . S/P knee replacement 04/17/2019  . Trigeminal neuralgia of left side of face 2015   s/p Gamma knife    Past Surgical History:  Procedure Laterality Date  . BREAST BIOPSY Right 07/24/2018  . BREAST LUMPECTOMY Right 09/01/2018  . BREAST LUMPECTOMY WITH RADIOACTIVE SEED LOCALIZATION Right 09/01/2018   Procedure: RIGHT BREAST LUMPECTOMY WITH RADIOACTIVE SEED LOCALIZATION;  Surgeon: Ovidio Kin, MD;  Location: [facility];  Service: General;  Laterality: Right;  . COLONOSCOPY    . Gamma Knife Left 04/08/2016  . HYSTEROSCOPY WITH D & C  05/24/2011   Procedure: DILATATION AND CURETTAGE (D&C) /HYSTEROSCOPY;  Surgeon: Geryl Rankins, MD;  Location: [facility];  Service: Gynecology;  Laterality:  N/A;  . KNEE SURGERY Right 06/10/15   torn ligament  . PARTIAL KNEE ARTHROPLASTY Right 04/17/2019   Procedure: UNICOMPARTMENTAL KNEE;  Surgeon: Teryl Lucy, MD;  Location: [facility];  Service: Orthopedics;  Laterality: Right;  . THYROIDECTOMY N/A 09/08/2016   Procedure: TOTAL THYROIDECTOMY;  Surgeon: Newman Pies, MD;  [facility];  Service: ENT;  Laterality: N/A;  . TUBAL LIGATION    . uterine polyp    . WISDOM TOOTH EXTRACTION Left 2014    There were no vitals filed for this visit.   Subjective Assessment - 07/18/20 1413    Subjective relays the weather is likely contributing to her pain, has not yet tried the exercises but knows she needs to    Pertinent History hyperlipidemia, HTN, hypothyroidism, DM 2, History Rt beast cancer    Patient Stated Goals Strengthen core and back, become painfree    Pain Onset More than a month ago             [facility] Adult PT Treatment/Exercise - 07/18/20 0001      Exercises   Exercises Lumbar      Lumbar Exercises: Stretches   Single Knee to Chest Stretch Right;Left;2 reps;30 seconds    Lower Trunk Rotation 5 reps;10 seconds    Other Lumbar Stretch Exercise seated stool roll outs into flexion 10 sec X 10 reps      Lumbar Exercises: Aerobic  Nustep L4 UE/LE X 10 min      Lumbar Exercises: Standing   Row 15 reps    Theraband Level (Row) Level 2 (Red)    Shoulder Extension 15 reps    Theraband Level (Shoulder Extension) Level 2 (Red)    Other Standing Lumbar Exercises anti rotation walk outs red band X 10      Lumbar Exercises: Supine   Pelvic Tilt 15 reps    Clam Limitations unilat clams X 15 with contralateral iso hold red band, performed on ea side    Bent Knee Raise 10 reps    Bent Knee Raise Limitations with ab set and contralateral iso hold at 90 (up/up, down/down)    Bridge 10 reps;3 seconds      Manual Therapy   Manual therapy comments 10 mins STM to low back paraspinals, gentle                    PT Short Term  Goals - 07/11/20 1148      PT SHORT TERM GOAL #1   Title Patient will be independent with HEP for improved strength, motion, and function.    Baseline HEP created 07/11/20    Time 3    Period Weeks    Status New    Target Date 08/01/20      PT SHORT TERM GOAL #2   Title Will test patient balance and sest goals appropriately for increased stability and coordination    Baseline gait assessment    Time 2    Period Weeks    Status New    Target Date 07/25/20             PT Long Term Goals - 07/11/20 1150      PT LONG TERM GOAL #1   Title Patient will demonstrate the ability to climb and descend stairs with confidence at least 2x a day for increased autonomy    Baseline reports difficulty with stairs at times, sometimes sleeps downstairs    Time 6    Period Weeks    Status New    Target Date 08/22/20      PT LONG TERM GOAL #2   Title Patient will demonstrate increased strength in hip abduction and hip extension bilaterally for improved function    Baseline hip abduction3/5 hip ext 4/5    Time 6    Period Weeks    Status New    Target Date 08/22/20      PT LONG TERM GOAL #3   Title Patient will decrease daily pain to 2/10 as baseline for increased ability for ADLs and leisure activities    Baseline pain is resting at 1 currently but can get wrose at night/morning    Time 6    Period Weeks    Status New    Target Date 08/22/20      PT LONG TERM GOAL #4   Title Patient willimprove FOTO functional score to 65%    Baseline 56%    Time 6    Period Weeks    Status New    Target Date 08/22/20                 Plan - 07/18/20 1436    Clinical Impression Statement Session focsused on HEP review and she showed good return demonstration with these and had good overall activity tolerance to session.  She was encouraged to begin HEP at home and consistenly perform this to address her funcitonal  impairments. She again benefited from [facility] and manual therapy today and relays  less overall pain after sesison.    Personal Factors and Comorbidities Comorbidity 2;Comorbidity 1    Comorbidities hyperlipidemia, HTN, hypothyroidism, DM 2, History Rt beast cancer    Examination-Activity Limitations Sleep;Stairs;Other    Stability/Clinical Decision Making Stable/Uncomplicated    Rehab Potential Fair    PT Frequency 2x / week    PT Duration 6 weeks    PT Treatment/Interventions ADLs/Self Care Home Management;Gait training;Stair training;Functional mobility training;Therapeutic activities;Therapeutic exercise;Balance training;Neuromuscular re-education;Electrical Stimulation;Cryotherapy;Biofeedback;Moist Heat;Passive range of motion;Manual techniques;Vasopneumatic Device;Taping    PT Next Visit Plan address balance deficits, progress hip strengtening and core strength exercises, check in with HEP    PT Home Exercise Plan Access Code: [email or web address]    Consulted and Agree with Plan of Care Patient           Patient will benefit from skilled therapeutic intervention in order to improve the following deficits and impairments:  Abnormal gait,Decreased activity tolerance,Difficulty walking,Decreased range of motion,Decreased coordination,Decreased endurance,Pain,Decreased balance,Decreased mobility,Decreased strength,Impaired flexibility  Visit Diagnosis: Chronic midline low back pain without sciatica  Muscle weakness (generalized)  Pain in left hip  Pain in right hip     Problem List Patient Active Problem List   Diagnosis Date Noted  . Atherosclerosis of aorta ([place]) 06/22/2020  . Impacted cerumen of right ear 06/22/2020  . Malignant neoplasm of nipple of right breast in female, unspecified estrogen receptor status ([place]) 03/12/2020  . Primary localized osteoarthritis of right knee 04/17/2019  . Genetic testing 08/28/2018  . Pre-operative cardiovascular examination 08/24/2018  . Nonspecific abnormal electrocardiogram (ECG) (EKG) 08/24/2018  . Family history of ovarian  cancer   . Family history of thyroid cancer   . Ductal carcinoma in situ (DCIS) of right breast 07/27/2018  . Pure hypercholesterolemia 05/10/2018  . Uncontrolled type 2 diabetes mellitus with hyperglycemia ([place]) 05/10/2018  . Essential hypertension 05/10/2018  . Bilateral primary osteoarthritis of knee 05/10/2018  . Hypothyroidism 05/10/2018  . H/O total thyroidectomy 09/08/2016  . Trigeminal neuralgia of left side of face 04/26/2014    Silvestre Mesi 07/18/2020, 2:39 PM  [facility] Physical Therapy [address] [place], [place], [address] Phone: [phone]   Fax:  [phone]  Name: Kierra Jezewski MRN: [id number] Date of Birth: 1950-10-04

## 2020-07-22 ENCOUNTER — Other Ambulatory Visit: Payer: Self-pay

## 2020-07-23 ENCOUNTER — Other Ambulatory Visit: Payer: Self-pay

## 2020-07-23 ENCOUNTER — Ambulatory Visit: Payer: Medicare PPO | Admitting: Physical Therapy

## 2020-07-23 ENCOUNTER — Encounter: Payer: Self-pay | Admitting: Physical Therapy

## 2020-07-23 DIAGNOSIS — M25552 Pain in left hip: Secondary | ICD-10-CM | POA: Diagnosis not present

## 2020-07-23 DIAGNOSIS — M6281 Muscle weakness (generalized): Secondary | ICD-10-CM

## 2020-07-23 DIAGNOSIS — M545 Low back pain, unspecified: Secondary | ICD-10-CM | POA: Diagnosis not present

## 2020-07-23 DIAGNOSIS — M25551 Pain in right hip: Secondary | ICD-10-CM | POA: Diagnosis not present

## 2020-07-23 DIAGNOSIS — G8929 Other chronic pain: Secondary | ICD-10-CM

## 2020-07-23 NOTE — Therapy (Signed)
[facility] [address] [place], [place], [address] Phone: [phone]   Fax:  [phone]  Physical Therapy Treatment  Patient Details  Name: Christina Hernandez MRN: [id number] Date of Birth: [birth year] Referring Provider (PT): Bary Castilla, NP   Encounter Date: 07/23/2020   PT End of Session - 07/23/20 1045    Visit Number 3    Number of Visits 12    Date for PT Re-Evaluation 09/12/20    Authorization Type [employer]    PT Start Time 5366    PT Stop Time 1100    PT Time Calculation (min) 46 min    Activity Tolerance Patient tolerated treatment well;No increased pain           Past Medical History:  Diagnosis Date  . Arthritis   . Breast cancer, right breast [facility])    pending right breast lumpectomy on September 01, 2018  . Diabetes mellitus type II, controlled ([place])    On Kombiglyze  . Family history of ovarian cancer   . Family history of thyroid cancer   . GERD (gastroesophageal reflux disease)    rarely occurs- no meds  . Hyperlipidemia associated with type 2 diabetes mellitus ([place])   . Hypertension   . Hypothyroidism   . Personal history of radiation therapy   . Primary localized osteoarthritis of right knee 04/17/2019  . S/P knee replacement 04/17/2019  . Trigeminal neuralgia of left side of face 2015   s/p Gamma knife    Past Surgical History:  Procedure Laterality Date  . BREAST BIOPSY Right 07/24/2018  . BREAST LUMPECTOMY Right 09/01/2018  . BREAST LUMPECTOMY WITH RADIOACTIVE SEED LOCALIZATION Right 09/01/2018   Procedure: RIGHT BREAST LUMPECTOMY WITH RADIOACTIVE SEED LOCALIZATION;  Surgeon: [name], MD;  Location: [place];  Service: General;  Laterality: Right;  . COLONOSCOPY    . Gamma Knife Left 04/08/2016  . HYSTEROSCOPY WITH D & C  05/24/2011   Procedure: DILATATION AND CURETTAGE (D&C) /HYSTEROSCOPY;  Surgeon: [name], MD;  Location: [facility];  Service: Gynecology;  Laterality:  N/A;  . KNEE SURGERY Right 06/10/15   torn ligament  . PARTIAL KNEE ARTHROPLASTY Right 04/17/2019   Procedure: UNICOMPARTMENTAL KNEE;  Surgeon: [name], MD;  Location: [facility];  Service: Orthopedics;  Laterality: Right;  . THYROIDECTOMY N/A 09/08/2016   Procedure: TOTAL THYROIDECTOMY;  Surgeon: [name], MD;  Location: [place];  Service: ENT;  Laterality: N/A;  . TUBAL LIGATION    . uterine polyp    . WISDOM TOOTH EXTRACTION Left 2014    There were no vitals filed for this visit.   Subjective Assessment - 07/23/20 1021    Subjective She states she had a lot of LBP last night and not sure why, it has resolved down to a 4/10 upon arrival to PT sesssion today.    Pertinent History hyperlipidemia, HTN, hypothyroidism, DM 2, History Rt beast cancer    Patient Stated Goals Strengthen core and back, become painfree    Pain Onset More than a month ago              [facility] PT Assessment - 07/23/20 0001      Assessment   Medical Diagnosis Chronic Midline low back pain M.54.50,G89.29    Referring Provider (PT) Bary Castilla, NP    Onset Date/Surgical Date 10/10/19      AROM   Lumbar Flexion 90%    Lumbar Extension 50%    Lumbar - Right Side Bend 50%  Lumbar - Left Side Bend 50%    Lumbar - Right Rotation 50%    Lumbar - Left Rotation 50%            [facility] Adult PT Treatment/Exercise - 07/23/20 0001      Lumbar Exercises: Stretches   Single Knee to Chest Stretch Right;Left;2 reps;30 seconds    Lower Trunk Rotation 5 reps;10 seconds    Other Lumbar Stretch Exercise seated stool roll outs into flexion 10 sec X 10 reps      Lumbar Exercises: Aerobic   Nustep L4 UE/LE X 10 min      Lumbar Exercises: Standing   Row 20 reps    Theraband Level (Row) Level 2 (Red)    Shoulder Extension 20 reps    Theraband Level (Shoulder Extension) Level 2 (Red)    Other Standing Lumbar Exercises anti rotation walk outs red band X 10    Other Standing Lumbar Exercises standing hip abd  and ext with red band X 15 bilat, standing tandem balance with PRN intermittent UE support 1 min bilat      Lumbar Exercises: Supine   Clam Limitations --    Bent Knee Raise 10 reps    Bent Knee Raise Limitations with ab set and contralateral iso hold at 90 (up/up, down/down)    Bridge 10 reps;3 seconds      Manual Therapy   Manual therapy comments 10 mins STM to low back paraspinals, in sitting fleixon stretch                    PT Short Term Goals - 07/11/20 1148      PT SHORT TERM GOAL #1   Title Patient will be independent with HEP for improved strength, motion, and function.    Baseline HEP created 07/11/20    Time 3    Period Weeks    Status New    Target Date 08/01/20      PT SHORT TERM GOAL #2   Title Will test patient balance and sest goals appropriately for increased stability and coordination    Baseline gait assessment    Time 2    Period Weeks    Status New    Target Date 07/25/20             PT Long Term Goals - 07/11/20 1150      PT LONG TERM GOAL #1   Title Patient will demonstrate the ability to climb and descend stairs with confidence at least 2x a day for increased autonomy    Baseline reports difficulty with stairs at times, sometimes sleeps downstairs    Time 6    Period Weeks    Status New    Target Date 08/22/20      PT LONG TERM GOAL #2   Title Patient will demonstrate increased strength in hip abduction and hip extension bilaterally for improved function    Baseline hip abduction3/5 hip ext 4/5    Time 6    Period Weeks    Status New    Target Date 08/22/20      PT LONG TERM GOAL #3   Title Patient will decrease daily pain to 2/10 as baseline for increased ability for ADLs and leisure activities    Baseline pain is resting at 1 currently but can get wrose at night/morning    Time 6    Period Weeks    Status New    Target Date 08/22/20  PT LONG TERM GOAL #4   Title Patient willimprove FOTO functional score to 65%     Baseline 56%    Time 6    Period Weeks    Status New    Target Date 08/22/20                 Plan - 07/23/20 1111    Clinical Impression Statement Progressed her strengthening program with good overall tolerance and added in tandem balance to address her balance deficits. Performed STM in sitting today but she states she would prefer to be prone next time. Did trial biofreeze today in efforts to reduce overall pain. Her lumbar ROM is overall improving.    Personal Factors and Comorbidities Comorbidity 2;Comorbidity 1    Comorbidities hyperlipidemia, HTN, hypothyroidism, DM 2, History Rt beast cancer    Examination-Activity Limitations Sleep;Stairs;Other    Stability/Clinical Decision Making Stable/Uncomplicated    Rehab Potential Fair    PT Frequency 2x / week    PT Duration 6 weeks    PT Treatment/Interventions ADLs/Self Care Home Management;Gait training;Stair training;Functional mobility training;Therapeutic activities;Therapeutic exercise;Balance training;Neuromuscular re-education;Electrical Stimulation;Cryotherapy;Biofeedback;Moist Heat;Passive range of motion;Manual techniques;Vasopneumatic Device;Taping    PT Next Visit Plan address balance deficits, progress hip strengtening and core strength exercises, check in with HEP, STM in prone    PT Home Exercise Plan Access Code: [email or web address]    Consulted and Agree with Plan of Care Patient           Patient will benefit from skilled therapeutic intervention in order to improve the following deficits and impairments:  Abnormal gait,Decreased activity tolerance,Difficulty walking,Decreased range of motion,Decreased coordination,Decreased endurance,Pain,Decreased balance,Decreased mobility,Decreased strength,Impaired flexibility  Visit Diagnosis: Chronic midline low back pain without sciatica  Muscle weakness (generalized)  Pain in left hip  Pain in right hip     Problem List Patient Active Problem List   Diagnosis Date  Noted  . Atherosclerosis of aorta ([place]) 06/22/2020  . Impacted cerumen of right ear 06/22/2020  . Malignant neoplasm of nipple of right breast in female, unspecified estrogen receptor status ([place]) 03/12/2020  . Primary localized osteoarthritis of right knee 04/17/2019  . Genetic testing 08/28/2018  . Pre-operative cardiovascular examination 08/24/2018  . Nonspecific abnormal electrocardiogram (ECG) (EKG) 08/24/2018  . Family history of ovarian cancer   . Family history of thyroid cancer   . Ductal carcinoma in situ (DCIS) of right breast 07/27/2018  . Pure hypercholesterolemia 05/10/2018  . Uncontrolled type 2 diabetes mellitus with hyperglycemia ([place]) 05/10/2018  . Essential hypertension 05/10/2018  . Bilateral primary osteoarthritis of knee 05/10/2018  . Hypothyroidism 05/10/2018  . H/O total thyroidectomy 09/08/2016  . Trigeminal neuralgia of left side of face 04/26/2014    Silvestre Mesi 07/23/2020, 11:12 AM  [facility] [address] [place], [place], [address] Phone: [phone]   Fax:  [phone]  Name: Christina Hernandez MRN: [id number] Date of Birth: 30-Nov-1950

## 2020-07-25 ENCOUNTER — Encounter: Payer: Medicare PPO | Admitting: Physical Therapy

## 2020-07-30 ENCOUNTER — Encounter: Payer: Medicare PPO | Admitting: Physical Therapy

## 2020-08-01 ENCOUNTER — Other Ambulatory Visit: Payer: Self-pay

## 2020-08-01 ENCOUNTER — Ambulatory Visit: Payer: Medicare PPO | Admitting: Physical Therapy

## 2020-08-01 DIAGNOSIS — M545 Low back pain, unspecified: Secondary | ICD-10-CM | POA: Diagnosis not present

## 2020-08-01 DIAGNOSIS — M25552 Pain in left hip: Secondary | ICD-10-CM | POA: Diagnosis not present

## 2020-08-01 DIAGNOSIS — M25551 Pain in right hip: Secondary | ICD-10-CM

## 2020-08-01 DIAGNOSIS — M6281 Muscle weakness (generalized): Secondary | ICD-10-CM | POA: Diagnosis not present

## 2020-08-01 DIAGNOSIS — G8929 Other chronic pain: Secondary | ICD-10-CM | POA: Diagnosis not present

## 2020-08-01 NOTE — Therapy (Signed)
[facility] [address] [place], [place], [address] Phone: [phone]   Fax:  [phone]  Physical Therapy Treatment  Patient Details  Name: Christina Hernandez MRN: [id number] Date of Birth: 11/06/1950 Referring Provider (PT): Bary Castilla, NP   Encounter Date: 08/01/2020   PT End of Session - 08/01/20 1104    Visit Number 4    Number of Visits 12    Date for PT Re-Evaluation 09/12/20    Authorization Type [employer]    PT Start Time 1017    PT Stop Time 1058    PT Time Calculation (min) 41 min    Activity Tolerance Patient tolerated treatment well;No increased pain           Past Medical History:  Diagnosis Date  . Arthritis   . Breast cancer, right breast [facility])    pending right breast lumpectomy on September 01, 2018  . Diabetes mellitus type II, controlled ([place])    On Kombiglyze  . Family history of ovarian cancer   . Family history of thyroid cancer   . GERD (gastroesophageal reflux disease)    rarely occurs- no meds  . Hyperlipidemia associated with type 2 diabetes mellitus ([place])   . Hypertension   . Hypothyroidism   . Personal history of radiation therapy   . Primary localized osteoarthritis of right knee 04/17/2019  . S/P knee replacement 04/17/2019  . Trigeminal neuralgia of left side of face 2015   s/p Gamma knife    Past Surgical History:  Procedure Laterality Date  . BREAST BIOPSY Right 07/24/2018  . BREAST LUMPECTOMY Right 09/01/2018  . BREAST LUMPECTOMY WITH RADIOACTIVE SEED LOCALIZATION Right 09/01/2018   Procedure: RIGHT BREAST LUMPECTOMY WITH RADIOACTIVE SEED LOCALIZATION;  Surgeon: [name], MD;  Location: [place];  Service: General;  Laterality: Right;  . COLONOSCOPY    . Gamma Knife Left 04/08/2016  . HYSTEROSCOPY WITH D & C  05/24/2011   Procedure: DILATATION AND CURETTAGE (D&C) /HYSTEROSCOPY;  Surgeon: [name], MD;  Location: [facility];  Service: Gynecology;  Laterality:  N/A;  . KNEE SURGERY Right 06/10/15   torn ligament  . PARTIAL KNEE ARTHROPLASTY Right 04/17/2019   Procedure: UNICOMPARTMENTAL KNEE;  Surgeon: [name], MD;  Location[facility];  Service: Orthopedics;  Laterality: Right;  . THYROIDECTOMY N/A 09/08/2016   Procedure: TOTAL THYROIDECTOMY;  Surgeon: [name], MD;  Location: [place];  Service: ENT;  Laterality: N/A;  . TUBAL LIGATION    . uterine polyp    . WISDOM TOOTH EXTRACTION Left 2014    There were no vitals filed for this visit.   Subjective Assessment - 08/01/20 1020    Subjective Patient states she has minimal pain today and is feeling pretty good. She does have some shoulder pain that she is planning to make a Dr. appt for.    Pertinent History hyperlipidemia, HTN, hypothyroidism, DM 2, History Rt beast cancer    Patient Stated Goals Strengthen core and back, become painfree    Currently in Pain? Yes    Pain Score 2     Pain Location Back    Pain Orientation Lower    Pain Onset More than a month ago             [facility] Adult PT Treatment/Exercise - 08/01/20 0001      Lumbar Exercises: Stretches   Single Knee to Chest Stretch Right;Left;2 reps;30 seconds    Lower Trunk Rotation 5 reps;10 seconds  Lumbar Exercises: Aerobic   Nustep L4 UE/LE X 10 min      Lumbar Exercises: Standing   Row 20 reps    Theraband Level (Row) Level 2 (Red)    Shoulder Extension 20 reps    Theraband Level (Shoulder Extension) Level 2 (Red)    Other Standing Lumbar Exercises anti rotation walk outs red band X 10    Other Standing Lumbar Exercises tandem balance on airex 30 sec 4x bilat; standing hip abduction red Tband 2x10 with UE, standing hip extension red Tband 2x10 with UE      Lumbar Exercises: Seated   Other Seated Lumbar Exercises ball rollouts 15x 5 sec hold    Other Seated Lumbar Exercises seated on stability ball marching, 10x each 2 sec hold with UE support      Lumbar Exercises: Supine   Bridge 20 reps;3 seconds    Other  Supine Lumbar Exercises adductor ball squeezes 10x 5 sec hold                    PT Short Term Goals - 07/11/20 1148      PT SHORT TERM GOAL #1   Title Patient will be independent with HEP for improved strength, motion, and function.    Baseline HEP created 07/11/20    Time 3    Period Weeks    Status New    Target Date 08/01/20      PT SHORT TERM GOAL #2   Title Will test patient balance and sest goals appropriately for increased stability and coordination    Baseline gait assessment    Time 2    Period Weeks    Status New    Target Date 07/25/20             PT Long Term Goals - 07/11/20 1150      PT LONG TERM GOAL #1   Title Patient will demonstrate the ability to climb and descend stairs with confidence at least 2x a day for increased autonomy    Baseline reports difficulty with stairs at times, sometimes sleeps downstairs    Time 6    Period Weeks    Status New    Target Date 08/22/20      PT LONG TERM GOAL #2   Title Patient will demonstrate increased strength in hip abduction and hip extension bilaterally for improved function    Baseline hip abduction3/5 hip ext 4/5    Time 6    Period Weeks    Status New    Target Date 08/22/20      PT LONG TERM GOAL #3   Title Patient will decrease daily pain to 2/10 as baseline for increased ability for ADLs and leisure activities    Baseline pain is resting at 1 currently but can get wrose at night/morning    Time 6    Period Weeks    Status New    Target Date 08/22/20      PT LONG TERM GOAL #4   Title Patient willimprove FOTO functional score to 65%    Baseline 56%    Time 6    Period Weeks    Status New    Target Date 08/22/20                 Plan - 08/01/20 1201    Clinical Impression Statement Patient is making good progress with her tolerance to strengthening exercises, continue to address balance deficits with static balance and  then work towards dynamic balance. Her motion continues to  improve with increased movement and strengthening, continue skilled PT for functional increases in mobility.    Personal Factors and Comorbidities Comorbidity 2;Comorbidity 1    Comorbidities hyperlipidemia, HTN, hypothyroidism, DM 2, History Rt beast cancer    Examination-Activity Limitations Sleep;Stairs;Other    Stability/Clinical Decision Making Stable/Uncomplicated    Rehab Potential Fair    PT Frequency 2x / week    PT Duration 6 weeks    PT Treatment/Interventions ADLs/Self Care Home Management;Gait training;Stair training;Functional mobility training;Therapeutic activities;Therapeutic exercise;Balance training;Neuromuscular re-education;Electrical Stimulation;Cryotherapy;Biofeedback;Moist Heat;Passive range of motion;Manual techniques;Vasopneumatic Device;Taping    PT Next Visit Plan address balance deficits, progress hip strengtening and core strength exercises on stability ball, prone STM PRN    PT Home Exercise Plan Access Code: [email or web address]    Consulted and Agree with Plan of Care Patient           Patient will benefit from skilled therapeutic intervention in order to improve the following deficits and impairments:  Abnormal gait,Decreased activity tolerance,Difficulty walking,Decreased range of motion,Decreased coordination,Decreased endurance,Pain,Decreased balance,Decreased mobility,Decreased strength,Impaired flexibility  Visit Diagnosis: Chronic midline low back pain without sciatica  Muscle weakness (generalized)  Pain in left hip  Pain in right hip     Problem List Patient Active Problem List   Diagnosis Date Noted  . Atherosclerosis of aorta ([place]) 06/22/2020  . Impacted cerumen of right ear 06/22/2020  . Malignant neoplasm of nipple of right breast in female, unspecified estrogen receptor status ([place]) 03/12/2020  . Primary localized osteoarthritis of right knee 04/17/2019  . Genetic testing 08/28/2018  . Pre-operative cardiovascular examination 08/24/2018  .  Nonspecific abnormal electrocardiogram (ECG) (EKG) 08/24/2018  . Family history of ovarian cancer   . Family history of thyroid cancer   . Ductal carcinoma in situ (DCIS) of right breast 07/27/2018  . Pure hypercholesterolemia 05/10/2018  . Uncontrolled type 2 diabetes mellitus with hyperglycemia ([place]) 05/10/2018  . Essential hypertension 05/10/2018  . Bilateral primary osteoarthritis of knee 05/10/2018  . Hypothyroidism 05/10/2018  . H/O total thyroidectomy 09/08/2016  . Trigeminal neuralgia of left side of face 04/26/2014    Glenetta Hew, SPT 08/01/2020, 12:05 PM  During this treatment session, this physical therapist was present, participating in and directing the treatment.   This note has been reviewed and this clinician agrees with the information provided.  Elsie Ra, PT, DPT 08/01/20 1:09 PM  [facility] [address] [place], [place], [address] Phone: [phone]   Fax:  [phone]  Name: Christina Hernandez MRN: [id number] Date of Birth: February 18, 1951

## 2020-08-06 ENCOUNTER — Ambulatory Visit: Payer: Medicare PPO | Admitting: Physical Therapy

## 2020-08-06 ENCOUNTER — Other Ambulatory Visit: Payer: Self-pay

## 2020-08-06 DIAGNOSIS — M25551 Pain in right hip: Secondary | ICD-10-CM | POA: Diagnosis not present

## 2020-08-06 DIAGNOSIS — M6281 Muscle weakness (generalized): Secondary | ICD-10-CM | POA: Diagnosis not present

## 2020-08-06 DIAGNOSIS — M545 Low back pain, unspecified: Secondary | ICD-10-CM | POA: Diagnosis not present

## 2020-08-06 DIAGNOSIS — M25552 Pain in left hip: Secondary | ICD-10-CM | POA: Diagnosis not present

## 2020-08-06 DIAGNOSIS — G8929 Other chronic pain: Secondary | ICD-10-CM | POA: Diagnosis not present

## 2020-08-06 NOTE — Therapy (Signed)
[facility] Physical Therapy [address] [place], [place], [address] Phone: [phone]   Fax:  [phone]  Physical Therapy Treatment  Patient Details  Name: Christina Hernandez MRN: [id number] Date of Birth: Dec 29, 1950 Referring Provider (PT): Bary Castilla, NP   Encounter Date: 08/06/2020   PT End of Session - 08/06/20 1058    Visit Number 5    Number of Visits 12    Date for PT Re-Evaluation 09/12/20    Authorization Type [employer]    PT Start Time 1017    PT Stop Time 1059    PT Time Calculation (min) 42 min    Activity Tolerance Patient tolerated treatment well;No increased pain    Behavior During Therapy WFL for tasks assessed/performed           Past Medical History:  Diagnosis Date  . Arthritis   . Breast cancer, right breast [facility])    pending right breast lumpectomy on September 01, 2018  . Diabetes mellitus type II, controlled ([place])    On Kombiglyze  . Family history of ovarian cancer   . Family history of thyroid cancer   . GERD (gastroesophageal reflux disease)    rarely occurs- no meds  . Hyperlipidemia associated with type 2 diabetes mellitus ([place])   . Hypertension   . Hypothyroidism   . Personal history of radiation therapy   . Primary localized osteoarthritis of right knee 04/17/2019  . S/P knee replacement 04/17/2019  . Trigeminal neuralgia of left side of face 2015   s/p Gamma knife    Past Surgical History:  Procedure Laterality Date  . BREAST BIOPSY Right 07/24/2018  . BREAST LUMPECTOMY Right 09/01/2018  . BREAST LUMPECTOMY WITH RADIOACTIVE SEED LOCALIZATION Right 09/01/2018   Procedure: RIGHT BREAST LUMPECTOMY WITH RADIOACTIVE SEED LOCALIZATION;  Surgeon: [name], MD;  Location: [place];  Service: General;  Laterality: Right;  . COLONOSCOPY    . Gamma Knife Left 04/08/2016  . HYSTEROSCOPY WITH D & C  05/24/2011   Procedure: DILATATION AND CURETTAGE (D&C) /HYSTEROSCOPY;  Surgeon: [name],  MD;  Location: [facility];  Service: Gynecology;  Laterality: N/A;  . KNEE SURGERY Right 06/10/15   torn ligament  . PARTIAL KNEE ARTHROPLASTY Right 04/17/2019   Procedure: UNICOMPARTMENTAL KNEE;  Surgeon: [name], MD;  Location: [facility];  Service: Orthopedics;  Laterality: Right;  . THYROIDECTOMY N/A 09/08/2016   Procedure: TOTAL THYROIDECTOMY;  Surgeon: [name], MD;  Location: [place];  Service: ENT;  Laterality: N/A;  . TUBAL LIGATION    . uterine polyp    . WISDOM TOOTH EXTRACTION Left 2014    There were no vitals filed for this visit.   Subjective Assessment - 08/06/20 1101    Subjective Patient reports pain is 1/10 today and relays she did not have any pain yesterday and was feeling good overall.    Pertinent History hyperlipidemia, HTN, hypothyroidism, DM 2, History Rt beast cancer    Patient Stated Goals Strengthen core and back, become painfree    Currently in Pain? Yes    Pain Score 1     Pain Location Back    Pain Descriptors / Indicators Aching    Pain Type Chronic pain    Pain Onset More than a month ago           [facility] Adult PT Treatment/Exercise - 08/06/20 0001      Lumbar Exercises: Stretches   Single Knee to Chest Stretch Right;Left;2 reps;30 seconds    Lower  Trunk Rotation 5 reps;10 seconds      Lumbar Exercises: Aerobic   Recumbent Bike 10 mins L4      Lumbar Exercises: Standing   Row 20 reps    Theraband Level (Row) Level 2 (Red)    Shoulder Extension 20 reps    Theraband Level (Shoulder Extension) Level 2 (Red)    Other Standing Lumbar Exercises anti rotation walk outs red band wih arms extended X 10    Other Standing Lumbar Exercises tandem stance 2x 30 sec, SLS 20 sec bilat x2,Hip extension 1lb weight 2x10 bilat, hip abduction 1lb weight 2x10 bilat      Lumbar Exercises: Seated   Sit to Stand 15 reps   18'' no UE   Other Seated Lumbar Exercises ball rollouts 15x 5 sec hold    Other Seated Lumbar Exercises seated on stability ball marching, 10x each  2 sec hold with UE support      Lumbar Exercises: Supine   Bridge 10 reps;3 seconds;Other (comment)   on stability ball   Other Supine Lumbar Exercises adductor ball squeezes 15x 5 sec hold                    PT Short Term Goals - 07/11/20 1148      PT SHORT TERM GOAL #1   Title Patient will be independent with HEP for improved strength, motion, and function.    Baseline HEP created 07/11/20    Time 3    Period Weeks    Status New    Target Date 08/01/20      PT SHORT TERM GOAL #2   Title Will test patient balance and sest goals appropriately for increased stability and coordination    Baseline gait assessment    Time 2    Period Weeks    Status New    Target Date 07/25/20             PT Long Term Goals - 07/11/20 1150      PT LONG TERM GOAL #1   Title Patient will demonstrate the ability to climb and descend stairs with confidence at least 2x a day for increased autonomy    Baseline reports difficulty with stairs at times, sometimes sleeps downstairs    Time 6    Period Weeks    Status New    Target Date 08/22/20      PT LONG TERM GOAL #2   Title Patient will demonstrate increased strength in hip abduction and hip extension bilaterally for improved function    Baseline hip abduction3/5 hip ext 4/5    Time 6    Period Weeks    Status New    Target Date 08/22/20      PT LONG TERM GOAL #3   Title Patient will decrease daily pain to 2/10 as baseline for increased ability for ADLs and leisure activities    Baseline pain is resting at 1 currently but can get wrose at night/morning    Time 6    Period Weeks    Status New    Target Date 08/22/20      PT LONG TERM GOAL #4   Title Patient willimprove FOTO functional score to 65%    Baseline 56%    Time 6    Period Weeks    Status New    Target Date 08/22/20                 Plan - 08/06/20 1104  Clinical Impression Statement Patient has been reporting overall lower pain and has demonstrated  good ability to more challenging therepeutic exercises. Continue to address LE weakness with core strength activities. Make 5xSTS goal and unstable surface balance for future session and progress to stair work for ease of daily function.    Personal Factors and Comorbidities Comorbidity 2;Comorbidity 1    Comorbidities hyperlipidemia, HTN, hypothyroidism, DM 2, History Rt beast cancer    Examination-Activity Limitations Sleep;Stairs;Other    Stability/Clinical Decision Making Stable/Uncomplicated    Rehab Potential Fair    PT Frequency 2x / week    PT Duration 6 weeks    PT Treatment/Interventions ADLs/Self Care Home Management;Gait training;Stair training;Functional mobility training;Therapeutic activities;Therapeutic exercise;Balance training;Neuromuscular re-education;Electrical Stimulation;Cryotherapy;Biofeedback;Moist Heat;Passive range of motion;Manual techniques;Vasopneumatic Device;Taping    PT Next Visit Plan do 5xSTS, SLS testing, step ups, progress hip strengtening and core strength exercises on stability ball, update HEP    PT Home Exercise Plan Access Code: [email or web address]    Consulted and Agree with Plan of Care Patient           Patient will benefit from skilled therapeutic intervention in order to improve the following deficits and impairments:  Abnormal gait,Decreased activity tolerance,Difficulty walking,Decreased range of motion,Decreased coordination,Decreased endurance,Pain,Decreased balance,Decreased mobility,Decreased strength,Impaired flexibility  Visit Diagnosis: Chronic midline low back pain without sciatica  Muscle weakness (generalized)  Pain in left hip  Pain in right hip     Problem List Patient Active Problem List   Diagnosis Date Noted  . Atherosclerosis of aorta ([place]) 06/22/2020  . Impacted cerumen of right ear 06/22/2020  . Malignant neoplasm of nipple of right breast in female, unspecified estrogen receptor status ([place]) 03/12/2020  . Primary localized  osteoarthritis of right knee 04/17/2019  . Genetic testing 08/28/2018  . Pre-operative cardiovascular examination 08/24/2018  . Nonspecific abnormal electrocardiogram (ECG) (EKG) 08/24/2018  . Family history of ovarian cancer   . Family history of thyroid cancer   . Ductal carcinoma in situ (DCIS) of right breast 07/27/2018  . Pure hypercholesterolemia 05/10/2018  . Uncontrolled type 2 diabetes mellitus with hyperglycemia ([place]) 05/10/2018  . Essential hypertension 05/10/2018  . Bilateral primary osteoarthritis of knee 05/10/2018  . Hypothyroidism 05/10/2018  . H/O total thyroidectomy 09/08/2016  . Trigeminal neuralgia of left side of face 04/26/2014    Glenetta Hew, SPT 08/06/2020, 11:09 AM   During this treatment session, this physical therapist was present, participating in and directing the treatment.   This note has been reviewed and this clinician agrees with the information provided.  Elsie Ra, PT, DPT 08/07/20 8:32 AM  [facility] Physical Therapy [address] [place], [place], [address] Phone: [phone]   Fax:  [phone]  Name: Christina Hernandez MRN: [id number] Date of Birth: 05/11/1951

## 2020-08-07 ENCOUNTER — Encounter (INDEPENDENT_AMBULATORY_CARE_PROVIDER_SITE_OTHER): Payer: Self-pay

## 2020-08-08 ENCOUNTER — Other Ambulatory Visit: Payer: Self-pay

## 2020-08-08 ENCOUNTER — Ambulatory Visit: Payer: Medicare PPO | Admitting: Physical Therapy

## 2020-08-08 DIAGNOSIS — M545 Low back pain, unspecified: Secondary | ICD-10-CM

## 2020-08-08 DIAGNOSIS — M25551 Pain in right hip: Secondary | ICD-10-CM | POA: Diagnosis not present

## 2020-08-08 DIAGNOSIS — G8929 Other chronic pain: Secondary | ICD-10-CM | POA: Diagnosis not present

## 2020-08-08 DIAGNOSIS — M25552 Pain in left hip: Secondary | ICD-10-CM

## 2020-08-08 DIAGNOSIS — M6281 Muscle weakness (generalized): Secondary | ICD-10-CM | POA: Diagnosis not present

## 2020-08-08 NOTE — Therapy (Signed)
[facility] Physical Therapy [address] [place], [place], [address] Phone: [phone]   Fax:  [phone]  Physical Therapy Treatment  Patient Details  Name: Christina Hernandez MRN: [id number] Date of Birth: 10-Nov-1950 Referring Provider (PT): Bary Castilla, NP   Encounter Date: 08/08/2020   PT End of Session - 08/08/20 1015    Visit Number 6    Number of Visits 12    Date for PT Re-Evaluation 09/12/20    Authorization Type [employer]    PT Start Time 1005    PT Stop Time 1050    PT Time Calculation (min) 45 min    Activity Tolerance Patient tolerated treatment well;No increased pain    Behavior During Therapy WFL for tasks assessed/performed           Past Medical History:  Diagnosis Date  . Arthritis   . Breast cancer, right breast [facility])    pending right breast lumpectomy on September 01, 2018  . Diabetes mellitus type II, controlled ([place])    On Kombiglyze  . Family history of ovarian cancer   . Family history of thyroid cancer   . GERD (gastroesophageal reflux disease)    rarely occurs- no meds  . Hyperlipidemia associated with type 2 diabetes mellitus ([place])   . Hypertension   . Hypothyroidism   . Personal history of radiation therapy   . Primary localized osteoarthritis of right knee 04/17/2019  . S/P knee replacement 04/17/2019  . Trigeminal neuralgia of left side of face 2015   s/p Gamma knife    Past Surgical History:  Procedure Laterality Date  . BREAST BIOPSY Right 07/24/2018  . BREAST LUMPECTOMY Right 09/01/2018  . BREAST LUMPECTOMY WITH RADIOACTIVE SEED LOCALIZATION Right 09/01/2018   Procedure: RIGHT BREAST LUMPECTOMY WITH RADIOACTIVE SEED LOCALIZATION;  Surgeon: [name], MD;  Location: [place];  Service: General;  Laterality: Right;  . COLONOSCOPY    . Gamma Knife Left 04/08/2016  . HYSTEROSCOPY WITH D & C  05/24/2011   Procedure: DILATATION AND CURETTAGE (D&C) /HYSTEROSCOPY;  Surgeon: [name],  MD;  Location: [facility];  Service: Gynecology;  Laterality: N/A;  . KNEE SURGERY Right 06/10/15   torn ligament  . PARTIAL KNEE ARTHROPLASTY Right 04/17/2019   Procedure: UNICOMPARTMENTAL KNEE;  Surgeon: [name], MD;  Location: [facility];  Service: Orthopedics;  Laterality: Right;  . THYROIDECTOMY N/A 09/08/2016   Procedure: TOTAL THYROIDECTOMY;  Surgeon: [name], MD;  Location: [place];  Service: ENT;  Laterality: N/A;  . TUBAL LIGATION    . uterine polyp    . WISDOM TOOTH EXTRACTION Left 2014    There were no vitals filed for this visit.   Subjective Assessment - 08/08/20 1009    Subjective Patient reports little to no pain today. She has noticed her pain does not come on as often and is not as severe as it once was.    Pertinent History hyperlipidemia, HTN, hypothyroidism, DM 2, History Rt beast cancer    Patient Stated Goals Strengthen core and back, become painfree    Pain Onset More than a month ago              [facility] PT Assessment - 08/08/20 0001      Standardized Balance Assessment   Standardized Balance Assessment Five Times Sit to Stand    Five times sit to stand comments  14.35  [place] Adult PT Treatment/Exercise - 08/08/20 0001      Lumbar Exercises: Stretches   Lower Trunk Rotation 5 reps;10 seconds      Lumbar Exercises: Aerobic   Recumbent Bike 10 mins L5      Lumbar Exercises: Standing   Row 20 reps    Theraband Level (Row) Level 2 (Red)    Shoulder Extension 20 reps    Theraband Level (Shoulder Extension) Level 2 (Red)    Other Standing Lumbar Exercises rocker board balace 2 mins, standing on airex alt. cone tapping 10x, step up and over airex laterally 10x with UE prn    Other Standing Lumbar Exercises SLS 20 sec bilat x2,Hip extension 1.5lb weight 2x10 bilat, hip abduction 1.5lb weight 2x10 bilat      Lumbar Exercises: Seated   Sit to Stand --    Other Seated Lumbar Exercises ball rollouts 15x 5 sec hold     Other Seated Lumbar Exercises seated on stability ball marching, 15x each 2 sec hold with UE support      Lumbar Exercises: Supine   Bridge 10 reps;3 seconds;Other (comment)   on stability ball   Other Supine Lumbar Exercises double knee to chest with stability ball 10x 5 sec hold                    PT Short Term Goals - 07/11/20 1148      PT SHORT TERM GOAL #1   Title Patient will be independent with HEP for improved strength, motion, and function.    Baseline HEP created 07/11/20    Time 3    Period Weeks    Status New    Target Date 08/01/20      PT SHORT TERM GOAL #2   Title Will test patient balance and sest goals appropriately for increased stability and coordination    Baseline gait assessment    Time 2    Period Weeks    Status New    Target Date 07/25/20             PT Long Term Goals - 08/08/20 1103      PT LONG TERM GOAL #1   Title Patient will demonstrate the ability to climb and descend stairs with confidence at least 2x a day for increased autonomy    Baseline reports difficulty with stairs at times, sometimes sleeps downstairs    Time 6    Period Weeks    Status New      PT LONG TERM GOAL #2   Title Patient will demonstrate increased strength in hip abduction and hip extension bilaterally for improved function    Baseline hip abduction3/5 hip ext 4/5    Time 6    Period Weeks    Status New      PT LONG TERM GOAL #3   Title Patient will decrease daily pain to 2/10 as baseline for increased ability for ADLs and leisure activities    Baseline pain is resting at 1 currently but can get wrose at night/morning    Time 6    Period Weeks    Status New      PT LONG TERM GOAL #4   Title Patient willimprove FOTO functional score to 65%    Baseline 56%    Time 6    Period Weeks    Status New      PT LONG TERM GOAL #5   Title Patient will improve 5xSTS to under 13 seconds  Baseline 14.35    Time 6    Period Weeks    Status New    Target  Date 08/22/20                 Plan - 08/08/20 1052    Clinical Impression Statement Tested patient's 5xSTS and set goal for improved time. Included additional balance activities for increased coordination and patient tolerated well with slight dependence on UE support, progress balance as able. Continue skilled PT and progress to advanced HEP for continued strengthening and continued decreased pain.    Personal Factors and Comorbidities Comorbidity 2;Comorbidity 1    Comorbidities hyperlipidemia, HTN, hypothyroidism, DM 2, History Rt beast cancer    Examination-Activity Limitations Sleep;Stairs;Other    Stability/Clinical Decision Making Stable/Uncomplicated    Rehab Potential Fair    PT Frequency 2x / week    PT Duration 6 weeks    PT Treatment/Interventions ADLs/Self Care Home Management;Gait training;Stair training;Functional mobility training;Therapeutic activities;Therapeutic exercise;Balance training;Neuromuscular re-education;Electrical Stimulation;Cryotherapy;Biofeedback;Moist Heat;Passive range of motion;Manual techniques;Vasopneumatic Device;Taping    PT Next Visit Plan stairs, SLS progression, step ups, progress hip strengtening and core strength exercises on stability ball, update HEP    PT Home Exercise Plan Access Code: [email or web address]    Consulted and Agree with Plan of Care Patient           Patient will benefit from skilled therapeutic intervention in order to improve the following deficits and impairments:  Abnormal gait,Decreased activity tolerance,Difficulty walking,Decreased range of motion,Decreased coordination,Decreased endurance,Pain,Decreased balance,Decreased mobility,Decreased strength,Impaired flexibility  Visit Diagnosis: Chronic midline low back pain without sciatica  Muscle weakness (generalized)  Pain in left hip  Pain in right hip     Problem List Patient Active Problem List   Diagnosis Date Noted  . Atherosclerosis of aorta ([place]) 06/22/2020   . Impacted cerumen of right ear 06/22/2020  . Malignant neoplasm of nipple of right breast in female, unspecified estrogen receptor status ([place]) 03/12/2020  . Primary localized osteoarthritis of right knee 04/17/2019  . Genetic testing 08/28/2018  . Pre-operative cardiovascular examination 08/24/2018  . Nonspecific abnormal electrocardiogram (ECG) (EKG) 08/24/2018  . Family history of ovarian cancer   . Family history of thyroid cancer   . Ductal carcinoma in situ (DCIS) of right breast 07/27/2018  . Pure hypercholesterolemia 05/10/2018  . Uncontrolled type 2 diabetes mellitus with hyperglycemia ([place]) 05/10/2018  . Essential hypertension 05/10/2018  . Bilateral primary osteoarthritis of knee 05/10/2018  . Hypothyroidism 05/10/2018  . H/O total thyroidectomy 09/08/2016  . Trigeminal neuralgia of left side of face 04/26/2014    Glenetta Hew, SPT 08/08/2020, 11:05 AM During this treatment session, this physical therapist was present, participating in and directing the treatment.   This note has been reviewed and this clinician agrees with the information provided.  Elsie Ra, PT, DPT 08/08/20 2:41 PM  [facility] Physical Therapy [address] [place], [place], [address] Phone: [phone]   Fax:  [phone]  Name: Christina Hernandez MRN: [id number] Date of Birth: 06/18/1951

## 2020-08-13 ENCOUNTER — Other Ambulatory Visit: Payer: Self-pay | Admitting: Internal Medicine

## 2020-08-13 ENCOUNTER — Other Ambulatory Visit: Payer: Self-pay

## 2020-08-13 ENCOUNTER — Ambulatory Visit (INDEPENDENT_AMBULATORY_CARE_PROVIDER_SITE_OTHER): Payer: Medicare PPO | Admitting: Physical Therapy

## 2020-08-13 DIAGNOSIS — M25552 Pain in left hip: Secondary | ICD-10-CM

## 2020-08-13 DIAGNOSIS — H401133 Primary open-angle glaucoma, bilateral, severe stage: Secondary | ICD-10-CM | POA: Diagnosis not present

## 2020-08-13 DIAGNOSIS — M545 Low back pain, unspecified: Secondary | ICD-10-CM

## 2020-08-13 DIAGNOSIS — M25551 Pain in right hip: Secondary | ICD-10-CM

## 2020-08-13 DIAGNOSIS — G8929 Other chronic pain: Secondary | ICD-10-CM

## 2020-08-13 DIAGNOSIS — M6281 Muscle weakness (generalized): Secondary | ICD-10-CM

## 2020-08-13 NOTE — Therapy (Signed)
[facility] Therapy [address] [place], [place], [address] Phone: [phone]   Fax:  [phone]  Physical Therapy Treatment  Patient Details  Name: Christina Hernandez MRN: [id number] Date of Birth: Feb 20, 1951 Referring Provider (PT): Bary Castilla, NP   Encounter Date: 08/13/2020   PT End of Session - 08/13/20 1028    Visit Number 7    Number of Visits 12    Date for PT Re-Evaluation 09/12/20    Authorization Type [employer]    PT Start Time 1020    PT Stop Time 1100    PT Time Calculation (min) 40 min    Activity Tolerance Patient tolerated treatment well;No increased pain    Behavior During Therapy WFL for tasks assessed/performed           Past Medical History:  Diagnosis Date  . Arthritis   . Breast cancer, right breast [facility])    pending right breast lumpectomy on September 01, 2018  . Diabetes mellitus type II, controlled ([place])    On Kombiglyze  . Family history of ovarian cancer   . Family history of thyroid cancer   . GERD (gastroesophageal reflux disease)    rarely occurs- no meds  . Hyperlipidemia associated with type 2 diabetes mellitus ([place])   . Hypertension   . Hypothyroidism   . Personal history of radiation therapy   . Primary localized osteoarthritis of right knee 04/17/2019  . S/P knee replacement 04/17/2019  . Trigeminal neuralgia of left side of face 2015   s/p Gamma knife    Past Surgical History:  Procedure Laterality Date  . BREAST BIOPSY Right 07/24/2018  . BREAST LUMPECTOMY Right 09/01/2018  . BREAST LUMPECTOMY WITH RADIOACTIVE SEED LOCALIZATION Right 09/01/2018   Procedure: RIGHT BREAST LUMPECTOMY WITH RADIOACTIVE SEED LOCALIZATION;  Surgeon: [name], MD;  Location: [place];  Service: General;  Laterality: Right;  . COLONOSCOPY    . Gamma Knife Left 04/08/2016  . HYSTEROSCOPY WITH D & C  05/24/2011   Procedure: DILATATION AND CURETTAGE (D&C) /HYSTEROSCOPY;  Surgeon: [name],  MD;  Location: [facility];  Service: Gynecology;  Laterality: N/A;  . KNEE SURGERY Right 06/10/15   torn ligament  . PARTIAL KNEE ARTHROPLASTY Right 04/17/2019   Procedure: UNICOMPARTMENTAL KNEE;  Surgeon: [name], MD;  Location: [facility];  Service: Orthopedics;  Laterality: Right;  . THYROIDECTOMY N/A 09/08/2016   Procedure: TOTAL THYROIDECTOMY;  Surgeon: [name], MD;  Location: [place];  Service: ENT;  Laterality: N/A;  . TUBAL LIGATION    . uterine polyp    . WISDOM TOOTH EXTRACTION Left 2014    There were no vitals filed for this visit.   Subjective Assessment - 08/13/20 1025    Subjective No reports of pain today.    Pertinent History hyperlipidemia, HTN, hypothyroidism, DM 2, History Rt beast cancer    Patient Stated Goals Strengthen core and back, become painfree    Pain Onset More than a month ago                             [facility] Adult PT Treatment/Exercise - 08/13/20 0001      Exercises   Exercises Knee/Hip      Lumbar Exercises: Stretches   Single Knee to Chest Stretch Right;Left;2 reps;30 seconds    Lower Trunk Rotation 5 reps;10 seconds      Lumbar Exercises: Standing   Row 10 reps;Both    Theraband  Level (Row) Level 3 (Green)    Shoulder Extension 10 reps;Both    Theraband Level (Shoulder Extension) Level 3 (Green)      Lumbar Exercises: Supine   Other Supine Lumbar Exercises double knee to chest with stability ball 15x with 3 sec hold      Knee/Hip Exercises: Standing   Hip Flexion Both;2 sets;10 reps;Knee bent   marching with #1.5 ankle weight to tap 8'' step   Abduction Limitations 2x10 bilateral with ankle weight #1.5    Hip Extension Stengthening;Both;2 sets;10 reps;Knee straight    Extension Limitations 1.5 ankle weight    Lateral Step Up 15 reps;1 set;Both;Hand Hold: 2;Step Height: 8"   with step down   Forward Step Up Both;1 set;15 reps    Other Standing Knee Exercises hamstring curl 10 reps bilat #1.5 weight    Other Standing Knee  Exercises lateral band side stepping (green tband) 2 laps 10 feet      Knee/Hip Exercises: Supine   Other Supine Knee/Hip Exercises green band clams 2x10                    PT Short Term Goals - 07/11/20 1148      PT SHORT TERM GOAL #1   Title Patient will be independent with HEP for improved strength, motion, and function.    Baseline HEP created 07/11/20    Time 3    Period Weeks    Status New    Target Date 08/01/20      PT SHORT TERM GOAL #2   Title Will test patient balance and sest goals appropriately for increased stability and coordination    Baseline gait assessment    Time 2    Period Weeks    Status New    Target Date 07/25/20             PT Long Term Goals - 08/13/20 1030      PT LONG TERM GOAL #1   Title Patient will demonstrate the ability to climb and descend stairs with confidence at least 2x a day for increased autonomy    Baseline reports difficulty with stairs at times, sometimes sleeps downstairs    Time 6    Period Weeks    Status On-going      PT LONG TERM GOAL #2   Title Patient will demonstrate increased strength in hip abduction and hip extension bilaterally for improved function    Baseline hip abduction3/5 hip ext 4/5    Time 6    Period Weeks    Status On-going      PT LONG TERM GOAL #3   Title Patient will decrease daily pain to 2/10 as baseline for increased ability for ADLs and leisure activities    Baseline pain is resting at 1 currently but can get wrose at night/morning    Time 6    Period Weeks    Status On-going      PT LONG TERM GOAL #4   Title Patient willimprove FOTO functional score to 65%    Baseline 56%    Time 6    Period Weeks    Status On-going      PT LONG TERM GOAL #5   Title Patient will improve 5xSTS to under 13 seconds    Baseline 14.35    Time 6    Period Weeks    Status On-going  Plan - 08/13/20 1103    Clinical Impression Statement Patient is making good progress  towars her LTGs, will continue to progress based on patient tolerance to increase LE strength without flare ups in back pain. She was able to take stairs with UE, continue to work towards higher stair with no UE for increased function at home.    Personal Factors and Comorbidities Comorbidity 2;Comorbidity 1    Comorbidities hyperlipidemia, HTN, hypothyroidism, DM 2, History Rt beast cancer    Examination-Activity Limitations Sleep;Stairs;Other    Stability/Clinical Decision Making Stable/Uncomplicated    Rehab Potential Fair    PT Frequency 2x / week    PT Duration 6 weeks    PT Treatment/Interventions ADLs/Self Care Home Management;Gait training;Stair training;Functional mobility training;Therapeutic activities;Therapeutic exercise;Balance training;Neuromuscular re-education;Electrical Stimulation;Cryotherapy;Biofeedback;Moist Heat;Passive range of motion;Manual techniques;Vasopneumatic Device;Taping    PT Next Visit Plan stairs, SLS progression, step ups, progress hip strengtening and core strength exercises on stability ball, update HEP    PT Home Exercise Plan Access Code: [email or web address]    Consulted and Agree with Plan of Care Patient           Patient will benefit from skilled therapeutic intervention in order to improve the following deficits and impairments:  Abnormal gait,Decreased activity tolerance,Difficulty walking,Decreased range of motion,Decreased coordination,Decreased endurance,Pain,Decreased balance,Decreased mobility,Decreased strength,Impaired flexibility  Visit Diagnosis: Chronic midline low back pain without sciatica  Muscle weakness (generalized)  Pain in left hip  Pain in right hip     Problem List Patient Active Problem List   Diagnosis Date Noted  . Atherosclerosis of aorta ([place]) 06/22/2020  . Impacted cerumen of right ear 06/22/2020  . Malignant neoplasm of nipple of right breast in female, unspecified estrogen receptor status ([place]) 03/12/2020  . Primary  localized osteoarthritis of right knee 04/17/2019  . Genetic testing 08/28/2018  . Pre-operative cardiovascular examination 08/24/2018  . Nonspecific abnormal electrocardiogram (ECG) (EKG) 08/24/2018  . Family history of ovarian cancer   . Family history of thyroid cancer   . Ductal carcinoma in situ (DCIS) of right breast 07/27/2018  . Pure hypercholesterolemia 05/10/2018  . Uncontrolled type 2 diabetes mellitus with hyperglycemia ([place]) 05/10/2018  . Essential hypertension 05/10/2018  . Bilateral primary osteoarthritis of knee 05/10/2018  . Hypothyroidism 05/10/2018  . H/O total thyroidectomy 09/08/2016  . Trigeminal neuralgia of left side of face 04/26/2014    Glenetta Hew, SPT 08/13/2020, 11:06 AM   During this treatment session, this physical therapist was present, participating in and directing the treatment.   This note has been reviewed and this clinician agrees with the information provided.  Elsie Ra, PT, DPT 08/13/20 2:06 PM   [facility] Physical Therapy [address] [place], [place], [address] Phone: [phone]   Fax:  [phone]  Name: Maude Hettich MRN: [id number] Date of Birth: April 12, 1951

## 2020-08-15 ENCOUNTER — Encounter: Payer: Medicare PPO | Admitting: Physical Therapy

## 2020-08-19 ENCOUNTER — Other Ambulatory Visit: Payer: Self-pay | Admitting: Internal Medicine

## 2020-08-20 ENCOUNTER — Encounter: Payer: Medicare PPO | Admitting: Physical Therapy

## 2020-08-22 ENCOUNTER — Ambulatory Visit
Admission: RE | Admit: 2020-08-22 | Discharge: 2020-08-22 | Disposition: A | Discharge status: Home / Self Care | Payer: Medicare PPO | Source: Ambulatory Visit | Attending: Oncology | Admitting: Oncology

## 2020-08-22 ENCOUNTER — Encounter: Payer: Medicare PPO | Admitting: Physical Therapy

## 2020-08-22 ENCOUNTER — Other Ambulatory Visit: Payer: Self-pay

## 2020-08-22 DIAGNOSIS — R922 Inconclusive mammogram: Secondary | ICD-10-CM | POA: Diagnosis not present

## 2020-08-22 DIAGNOSIS — D0511 Intraductal carcinoma in situ of right breast: Secondary | ICD-10-CM

## 2020-08-22 DIAGNOSIS — Z853 Personal history of malignant neoplasm of breast: Secondary | ICD-10-CM | POA: Diagnosis not present

## 2020-08-22 DIAGNOSIS — E7800 Pure hypercholesterolemia, unspecified: Secondary | ICD-10-CM

## 2020-08-22 DIAGNOSIS — E1165 Type 2 diabetes mellitus with hyperglycemia: Secondary | ICD-10-CM

## 2020-08-25 ENCOUNTER — Telehealth: Payer: Self-pay

## 2020-08-25 ENCOUNTER — Other Ambulatory Visit: Payer: Self-pay | Admitting: Internal Medicine

## 2020-08-25 DIAGNOSIS — E1165 Type 2 diabetes mellitus with hyperglycemia: Secondary | ICD-10-CM

## 2020-08-25 NOTE — Telephone Encounter (Signed)
I tried to return the pt's call.  The pt wanted samples of synthroid.  I also needed to verify how the pt has been taking her synthroid.

## 2020-08-27 ENCOUNTER — Encounter: Payer: Self-pay | Admitting: Physical Therapy

## 2020-08-27 ENCOUNTER — Other Ambulatory Visit: Payer: Self-pay

## 2020-08-27 ENCOUNTER — Ambulatory Visit: Payer: Medicare PPO | Admitting: Physical Therapy

## 2020-08-27 DIAGNOSIS — M25552 Pain in left hip: Secondary | ICD-10-CM | POA: Diagnosis not present

## 2020-08-27 DIAGNOSIS — G8929 Other chronic pain: Secondary | ICD-10-CM

## 2020-08-27 DIAGNOSIS — M6281 Muscle weakness (generalized): Secondary | ICD-10-CM | POA: Diagnosis not present

## 2020-08-27 DIAGNOSIS — M25551 Pain in right hip: Secondary | ICD-10-CM

## 2020-08-27 DIAGNOSIS — M545 Low back pain, unspecified: Secondary | ICD-10-CM

## 2020-08-27 MED ORDER — LEVOTHYROXINE SODIUM 50 MCG PO TABS: ORAL_TABLET | ORAL | 1 refills | Status: DC

## 2020-08-27 MED ORDER — LEVOTHYROXINE SODIUM 75 MCG PO TABS: ORAL_TABLET | ORAL | 1 refills | Status: DC

## 2020-08-27 NOTE — Therapy (Signed)
[facility] Physical Therapy [address] [place], [place], [address] Phone: [phone]   Fax:  [phone]  Physical Therapy Treatment  Patient Details  Name: Christina Hernandez MRN: [id number] Date of Birth: 06/10/1951 Referring Provider (PT): Bary Castilla, NP   Encounter Date: 08/27/2020   PT End of Session - 08/27/20 1020    Visit Number 8    Number of Visits 12    Date for PT Re-Evaluation 09/12/20    Authorization Type [employer]    PT Start Time 1017    PT Stop Time 1100    PT Time Calculation (min) 43 min    Activity Tolerance Patient tolerated treatment well;No increased pain    Behavior During Therapy WFL for tasks assessed/performed           Past Medical History:  Diagnosis Date  . Arthritis   . Breast cancer, right breast [facility])    pending right breast lumpectomy on September 01, 2018  . Diabetes mellitus type II, controlled ([place])    On Kombiglyze  . Family history of ovarian cancer   . Family history of thyroid cancer   . GERD (gastroesophageal reflux disease)    rarely occurs- no meds  . Hyperlipidemia associated with type 2 diabetes mellitus ([place])   . Hypertension   . Hypothyroidism   . Personal history of radiation therapy   . Primary localized osteoarthritis of right knee 04/17/2019  . S/P knee replacement 04/17/2019  . Trigeminal neuralgia of left side of face 2015   s/p Gamma knife    Past Surgical History:  Procedure Laterality Date  . BREAST BIOPSY Right 07/24/2018  . BREAST LUMPECTOMY Right 09/01/2018  . BREAST LUMPECTOMY WITH RADIOACTIVE SEED LOCALIZATION Right 09/01/2018   Procedure: RIGHT BREAST LUMPECTOMY WITH RADIOACTIVE SEED LOCALIZATION;  Surgeon: [name], MD;  Location: [place];  Service: General;  Laterality: Right;  . COLONOSCOPY    . Gamma Knife Left 04/08/2016  . HYSTEROSCOPY WITH D & C  05/24/2011   Procedure: DILATATION AND CURETTAGE (D&C) /HYSTEROSCOPY;  Surgeon: [name],  MD;  Location: [facility];  Service: Gynecology;  Laterality: N/A;  . KNEE SURGERY Right 06/10/15   torn ligament  . PARTIAL KNEE ARTHROPLASTY Right 04/17/2019   Procedure: UNICOMPARTMENTAL KNEE;  Surgeon: [name], MD;  Location: [facility];  Service: Orthopedics;  Laterality: Right;  . THYROIDECTOMY N/A 09/08/2016   Procedure: TOTAL THYROIDECTOMY;  Surgeon: [name], MD;  Location: [place];  Service: ENT;  Laterality: N/A;  . TUBAL LIGATION    . uterine polyp    . WISDOM TOOTH EXTRACTION Left 2014    There were no vitals filed for this visit.   Subjective Assessment - 08/27/20 1019    Subjective No pain today. She is reporting a little off balance at times on stairs or standing up from couch.    Pertinent History hyperlipidemia, HTN, hypothyroidism, DM 2, History Rt beast cancer    Patient Stated Goals Strengthen core and back, become painfree    Currently in Pain? No/denies    Pain Score 0-No pain    Pain Onset More than a month ago             [facility] Adult PT Treatment/Exercise - 08/27/20 0001      Lumbar Exercises: Machines for Strengthening   Leg Press 75 lbs 2x10      Lumbar Exercises: Standing   Other Standing Lumbar Exercises 6'' step up bilat with 1 hand 20x each  Lumbar Exercises: Seated   Sit to Stand --   with raising stability ball OH from higher chair, cues without stability ball for correct sit to stand form   Other Seated Lumbar Exercises green tband rows and extensions 2x10 seated on stability ball    Other Seated Lumbar Exercises tandem stance bilat 30sec x3 with UE tapping prn, tandem walking forward and reverse with UE tapping prn          Recumbent bike 10 min L2        PT Education - 08/27/20 1108    Education Details Verbal and tactile cueing for sit to stand form    Person(s) Educated Patient    Methods Explanation;Demonstration;Tactile cues;Verbal cues    Comprehension Verbalized understanding            PT Short Term Goals - 07/11/20  1148      PT SHORT TERM GOAL #1   Title Patient will be independent with HEP for improved strength, motion, and function.    Baseline HEP created 07/11/20    Time 3    Period Weeks    Status New    Target Date 08/01/20      PT SHORT TERM GOAL #2   Title Will test patient balance and sest goals appropriately for increased stability and coordination    Baseline gait assessment    Time 2    Period Weeks    Status New    Target Date 07/25/20             PT Long Term Goals - 08/13/20 1030      PT LONG TERM GOAL #1   Title Patient will demonstrate the ability to climb and descend stairs with confidence at least 2x a day for increased autonomy    Baseline reports difficulty with stairs at times, sometimes sleeps downstairs    Time 6    Period Weeks    Status On-going      PT LONG TERM GOAL #2   Title Patient will demonstrate increased strength in hip abduction and hip extension bilaterally for improved function    Baseline hip abduction3/5 hip ext 4/5    Time 6    Period Weeks    Status On-going      PT LONG TERM GOAL #3   Title Patient will decrease daily pain to 2/10 as baseline for increased ability for ADLs and leisure activities    Baseline pain is resting at 1 currently but can get wrose at night/morning    Time 6    Period Weeks    Status On-going      PT LONG TERM GOAL #4   Title Patient willimprove FOTO functional score to 65%    Baseline 56%    Time 6    Period Weeks    Status On-going      PT LONG TERM GOAL #5   Title Patient will improve 5xSTS to under 13 seconds    Baseline 14.35    Time 6    Period Weeks    Status On-going                 Plan - 08/27/20 1052    Clinical Impression Statement Patient is having less frequent pain and is making good progress toward LTGs. Continue cueing for better ability to perform STS from regular chair height without difficulty. Advised patient add tandem stance into her HEP with counter nearby for  increasing her static balance. Continue skilled  PT to increase static and dynamic stability and improve LE strength for improved function and maintainence of no pain.    Personal Factors and Comorbidities Comorbidity 2;Comorbidity 1    Comorbidities hyperlipidemia, HTN, hypothyroidism, DM 2, History Rt beast cancer    Examination-Activity Limitations Sleep;Stairs;Other    Stability/Clinical Decision Making Stable/Uncomplicated    Rehab Potential Fair    PT Frequency 2x / week    PT Duration 6 weeks    PT Treatment/Interventions ADLs/Self Care Home Management;Gait training;Stair training;Functional mobility training;Therapeutic activities;Therapeutic exercise;Balance training;Neuromuscular re-education;Electrical Stimulation;Cryotherapy;Biofeedback;Moist Heat;Passive range of motion;Manual techniques;Vasopneumatic Device;Taping    PT Next Visit Plan redo FOTO/progress note stairs, SLS progression, step ups,  update HEP for static balance-progres to I HEP    PT Home Exercise Plan Access Code: [email or web address]    Consulted and Agree with Plan of Care Patient           Patient will benefit from skilled therapeutic intervention in order to improve the following deficits and impairments:  Abnormal gait,Decreased activity tolerance,Difficulty walking,Decreased range of motion,Decreased coordination,Decreased endurance,Pain,Decreased balance,Decreased mobility,Decreased strength,Impaired flexibility  Visit Diagnosis: Muscle weakness (generalized)  Chronic midline low back pain without sciatica  Pain in left hip  Pain in right hip     Problem List Patient Active Problem List   Diagnosis Date Noted  . Atherosclerosis of aorta ([place]) 06/22/2020  . Impacted cerumen of right ear 06/22/2020  . Malignant neoplasm of nipple of right breast in female, unspecified estrogen receptor status ([place]) 03/12/2020  . Primary localized osteoarthritis of right knee 04/17/2019  . Genetic testing 08/28/2018  .  Pre-operative cardiovascular examination 08/24/2018  . Nonspecific abnormal electrocardiogram (ECG) (EKG) 08/24/2018  . Family history of ovarian cancer   . Family history of thyroid cancer   . Ductal carcinoma in situ (DCIS) of right breast 07/27/2018  . Pure hypercholesterolemia 05/10/2018  . Uncontrolled type 2 diabetes mellitus with hyperglycemia ([place]) 05/10/2018  . Essential hypertension 05/10/2018  . Bilateral primary osteoarthritis of knee 05/10/2018  . Hypothyroidism 05/10/2018  . H/O total thyroidectomy 09/08/2016  . Trigeminal neuralgia of left side of face 04/26/2014    Glenetta Hew, SPT 08/27/2020, 11:09 AM During this treatment session, this physical therapist was present, participating in and directing the treatment.   This note has been reviewed and this clinician agrees with the information provided.  Elsie Ra, PT, DPT 08/27/20 11:19 AM  [facility] Physical Therapy [address] [place], [place], [address] Phone: [phone]   Fax:  [phone]  Name: Shondrika Hoque MRN: [id number] Date of Birth: 12/29/1950

## 2020-08-29 ENCOUNTER — Ambulatory Visit: Payer: Medicare PPO | Admitting: Physical Therapy

## 2020-08-29 ENCOUNTER — Other Ambulatory Visit: Payer: Self-pay

## 2020-08-29 DIAGNOSIS — M25551 Pain in right hip: Secondary | ICD-10-CM

## 2020-08-29 DIAGNOSIS — G8929 Other chronic pain: Secondary | ICD-10-CM | POA: Diagnosis not present

## 2020-08-29 DIAGNOSIS — M25552 Pain in left hip: Secondary | ICD-10-CM | POA: Diagnosis not present

## 2020-08-29 DIAGNOSIS — M6281 Muscle weakness (generalized): Secondary | ICD-10-CM

## 2020-08-29 DIAGNOSIS — M545 Low back pain, unspecified: Secondary | ICD-10-CM | POA: Diagnosis not present

## 2020-08-29 NOTE — Therapy (Addendum)
[facility] Physical Therapy [address] [place], [place], [address] Phone: [phone]   Fax:  [phone]  Physical Therapy Treatment  Patient Details  Name: Christina Hernandez MRN: [id number] Date of Birth: 1950/12/25 Referring Provider (PT): Bary Castilla, NP   Encounter Date: 08/29/2020   PT End of Session - 08/29/20 1016    Visit Number 9    Number of Visits 12    Date for PT Re-Evaluation 09/12/20    Authorization Type [employer]    PT Start Time 8416    PT Stop Time 1104    PT Time Calculation (min) 49 min    Activity Tolerance Patient tolerated treatment well;No increased pain    Behavior During Therapy WFL for tasks assessed/performed           Past Medical History:  Diagnosis Date  . Arthritis   . Breast cancer, right breast [facility])    pending right breast lumpectomy on September 01, 2018  . Diabetes mellitus type II, controlled ([place])    On Kombiglyze  . Family history of ovarian cancer   . Family history of thyroid cancer   . GERD (gastroesophageal reflux disease)    rarely occurs- no meds  . Hyperlipidemia associated with type 2 diabetes mellitus ([place])   . Hypertension   . Hypothyroidism   . Personal history of radiation therapy   . Primary localized osteoarthritis of right knee 04/17/2019  . S/P knee replacement 04/17/2019  . Trigeminal neuralgia of left side of face 2015   s/p Gamma knife    Past Surgical History:  Procedure Laterality Date  . BREAST BIOPSY Right 07/24/2018  . BREAST LUMPECTOMY Right 09/01/2018  . BREAST LUMPECTOMY WITH RADIOACTIVE SEED LOCALIZATION Right 09/01/2018   Procedure: RIGHT BREAST LUMPECTOMY WITH RADIOACTIVE SEED LOCALIZATION;  Surgeon: [name], MD;  Location: [place];  Service: General;  Laterality: Right;  . COLONOSCOPY    . Gamma Knife Left 04/08/2016  . HYSTEROSCOPY WITH D & C  05/24/2011   Procedure: DILATATION AND CURETTAGE (D&C) /HYSTEROSCOPY;  Surgeon: [name],  MD;  Location: [facility];  Service: Gynecology;  Laterality: N/A;  . KNEE SURGERY Right 06/10/15   torn ligament  . PARTIAL KNEE ARTHROPLASTY Right 04/17/2019   Procedure: UNICOMPARTMENTAL KNEE;  Surgeon: [name], MD;  Location: [facility];  Service: Orthopedics;  Laterality: Right;  . THYROIDECTOMY N/A 09/08/2016   Procedure: TOTAL THYROIDECTOMY;  Surgeon: [name], MD;  Location: [place];  Service: ENT;  Laterality: N/A;  . TUBAL LIGATION    . uterine polyp    . WISDOM TOOTH EXTRACTION Left 2014    There were no vitals filed for this visit.                      [facility] Adult PT Treatment/Exercise - 08/29/20 0001      Neuro Re-ed    Neuro Re-ed Details  SLS on airex 30 sec bilat x2 marching on airex 20x, feet together on airex with head turns and nods 10x each, tandem walking with counter nearby 3 laps      Lumbar Exercises: Aerobic   Nustep L5 UE/LE x10 mins      Lumbar Exercises: Machines for Strengthening   Leg Press 75 lbs 3x10, SL 31 lbs 2x10 bilat      Lumbar Exercises: Standing   Other Standing Lumbar Exercises hip ext and abd 2lbs ankle weights 2x10 bilat, hamstring curls 2lbs 10x bilat    Other Standing  Lumbar Exercises SLS on airex 30 sec bilat x2 marching on airex 20x, feet together on airex with head turns and nods 10x each.      Lumbar Exercises: Seated   Long Arc Quad on Chair Both;2 sets;10 reps    Sit to Stand 20 reps      Lumbar Exercises: Supine   Bridge 15 reps;3 seconds   on stability ball   Other Supine Lumbar Exercises double knee to chest with stability ball 15x with 3 sec hold                    PT Short Term Goals - 07/11/20 1148      PT SHORT TERM GOAL #1   Title Patient will be independent with HEP for improved strength, motion, and function.    Baseline HEP created 07/11/20    Time 3    Period Weeks    Status New    Target Date 08/01/20      PT SHORT TERM GOAL #2   Title Will test patient balance and sest goals  appropriately for increased stability and coordination    Baseline gait assessment    Time 2    Period Weeks    Status New    Target Date 07/25/20             PT Long Term Goals - 08/13/20 1030      PT LONG TERM GOAL #1   Title Patient will demonstrate the ability to climb and descend stairs with confidence at least 2x a day for increased autonomy    Baseline reports difficulty with stairs at times, sometimes sleeps downstairs    Time 6    Period Weeks    Status On-going      PT LONG TERM GOAL #2   Title Patient will demonstrate increased strength in hip abduction and hip extension bilaterally for improved function    Baseline hip abduction3/5 hip ext 4/5    Time 6    Period Weeks    Status On-going      PT LONG TERM GOAL #3   Title Patient will decrease daily pain to 2/10 as baseline for increased ability for ADLs and leisure activities    Baseline pain is resting at 1 currently but can get wrose at night/morning    Time 6    Period Weeks    Status On-going      PT LONG TERM GOAL #4   Title Patient willimprove FOTO functional score to 65%    Baseline 56%    Time 6    Period Weeks    Status On-going      PT LONG TERM GOAL #5   Title Patient will improve 5xSTS to under 13 seconds    Baseline 14.35    Time 6    Period Weeks    Status On-going                 Plan - 08/29/20 1017    Clinical Impression Statement Session focused on increasing LE strength and challenging her balance in changing conditions. Patient tolerated activities well. We will Check her 10th visit progress note next visit.    Personal Factors and Comorbidities Comorbidity 2;Comorbidity 1    Comorbidities hyperlipidemia, HTN, hypothyroidism, DM 2, History Rt beast cancer    Examination-Activity Limitations Sleep;Stairs;Other    Stability/Clinical Decision Making Stable/Uncomplicated    Rehab Potential Fair    PT Frequency 2x / week  PT Duration 6 weeks    PT Treatment/Interventions  ADLs/Self Care Home Management;Gait training;Stair training;Functional mobility training;Therapeutic activities;Therapeutic exercise;Balance training;Neuromuscular re-education;Electrical Stimulation;Cryotherapy;Biofeedback;Moist Heat;Passive range of motion;Manual techniques;Vasopneumatic Device;Taping    PT Next Visit Plan progress note, recirporcal stair climbing, sit to stand, balance focus    PT Home Exercise Plan Access Code: [email or web address]    Consulted and Agree with Plan of Care Patient           Patient will benefit from skilled therapeutic intervention in order to improve the following deficits and impairments:  Abnormal gait,Decreased activity tolerance,Difficulty walking,Decreased range of motion,Decreased coordination,Decreased endurance,Pain,Decreased balance,Decreased mobility,Decreased strength,Impaired flexibility  Visit Diagnosis: Muscle weakness (generalized)  Chronic midline low back pain without sciatica  Pain in left hip  Pain in right hip     Problem List Patient Active Problem List   Diagnosis Date Noted  . Atherosclerosis of aorta ([place]) 06/22/2020  . Impacted cerumen of right ear 06/22/2020  . Malignant neoplasm of nipple of right breast in female, unspecified estrogen receptor status ([place]) 03/12/2020  . Primary localized osteoarthritis of right knee 04/17/2019  . Genetic testing 08/28/2018  . Pre-operative cardiovascular examination 08/24/2018  . Nonspecific abnormal electrocardiogram (ECG) (EKG) 08/24/2018  . Family history of ovarian cancer   . Family history of thyroid cancer   . Ductal carcinoma in situ (DCIS) of right breast 07/27/2018  . Pure hypercholesterolemia 05/10/2018  . Uncontrolled type 2 diabetes mellitus with hyperglycemia ([place]) 05/10/2018  . Essential hypertension 05/10/2018  . Bilateral primary osteoarthritis of knee 05/10/2018  . Hypothyroidism 05/10/2018  . H/O total thyroidectomy 09/08/2016  . Trigeminal neuralgia of left side of face  04/26/2014    Glenetta Hew, SPT 08/29/2020, 11:11 AM   During this treatment session, this physical therapist was present, participating in and directing the treatment.   This note has been reviewed and this clinician agrees with the information provided.  Elsie Ra, PT, DPT 08/29/20 11:22 AM   [facility] [address] [place], [place], [address] Phone: [phone]   Fax:  [phone]  Name: Chery Giusto MRN: [id number] Date of Birth: 1950-10-03

## 2020-09-03 ENCOUNTER — Ambulatory Visit: Payer: Medicare PPO | Admitting: Physical Therapy

## 2020-09-03 ENCOUNTER — Other Ambulatory Visit: Payer: Self-pay

## 2020-09-03 DIAGNOSIS — M25552 Pain in left hip: Secondary | ICD-10-CM

## 2020-09-03 DIAGNOSIS — M25551 Pain in right hip: Secondary | ICD-10-CM

## 2020-09-03 DIAGNOSIS — M545 Low back pain, unspecified: Secondary | ICD-10-CM

## 2020-09-03 DIAGNOSIS — M6281 Muscle weakness (generalized): Secondary | ICD-10-CM

## 2020-09-03 DIAGNOSIS — G8929 Other chronic pain: Secondary | ICD-10-CM

## 2020-09-03 NOTE — Therapy (Signed)
[facility] Physical Therapy [address] [place], [place], [address] Phone: [phone]   Fax:  [phone]  Physical Therapy Treatment/Progress note/Discharge Progress Note reporting period date 07/12/19 to 09/03/20  See below for objective and subjective measurements relating to patients progress with PT.  PHYSICAL THERAPY DISCHARGE SUMMARY  Visits from Start of Care: 10 Current functional level related to goals / functional outcomes: See below   Remaining deficits: See below   Education / Equipment: HEP  Plan: Patient agrees to discharge.  Patient goals were met. Patient is being discharged due to meeting the stated rehab goals.  ?????        Patient Details  Name: Christina Hernandez MRN: [id number] Date of Birth: Feb 20, 1951 Referring Provider (PT): Bary Castilla, NP   Encounter Date: 09/03/2020   PT End of Session - 09/03/20 1039    Visit Number 10    Number of Visits 12    Date for PT Re-Evaluation 09/12/20    Authorization Type [employer]    PT Start Time 1103    PT Stop Time 1045    PT Time Calculation (min) 31 min    Activity Tolerance Patient tolerated treatment well;No increased pain    Behavior During Therapy WFL for tasks assessed/performed           Past Medical History:  Diagnosis Date  . Arthritis   . Breast cancer, right breast [facility])    pending right breast lumpectomy on September 01, 2018  . Diabetes mellitus type II, controlled ([place])    On Kombiglyze  . Family history of ovarian cancer   . Family history of thyroid cancer   . GERD (gastroesophageal reflux disease)    rarely occurs- no meds  . Hyperlipidemia associated with type 2 diabetes mellitus ([place])   . Hypertension   . Hypothyroidism   . Personal history of radiation therapy   . Primary localized osteoarthritis of right knee 04/17/2019  . S/P knee replacement 04/17/2019  . Trigeminal neuralgia of left side of face 2015   s/p Gamma knife    Past Surgical  History:  Procedure Laterality Date  . BREAST BIOPSY Right 07/24/2018  . BREAST LUMPECTOMY Right 09/01/2018  . BREAST LUMPECTOMY WITH RADIOACTIVE SEED LOCALIZATION Right 09/01/2018   Procedure: RIGHT BREAST LUMPECTOMY WITH RADIOACTIVE SEED LOCALIZATION;  Surgeon: [name], MD;  Location: [place] [facility];  Service: General;  Laterality: Right;  . COLONOSCOPY    . Gamma Knife Left 04/08/2016  . HYSTEROSCOPY WITH D & C  05/24/2011   Procedure: DILATATION AND CURETTAGE (D&C) /HYSTEROSCOPY;  Surgeon: [name], MD;  Location: [facility];  Service: Gynecology;  Laterality: N/A;  . KNEE SURGERY Right 06/10/15   torn ligament  . PARTIAL KNEE ARTHROPLASTY Right 04/17/2019   Procedure: UNICOMPARTMENTAL KNEE;  Surgeon: [name], MD;  [facility];  Service: Orthopedics;  Laterality: Right;  . THYROIDECTOMY N/A 09/08/2016   Procedure: TOTAL THYROIDECTOMY;  Surgeon: [name], MD;  Location: [facility];  Service: ENT;  Laterality: N/A;  . TUBAL LIGATION    . uterine polyp    . WISDOM TOOTH EXTRACTION Left 2014    There were no vitals filed for this visit.   Subjective Assessment - 09/03/20 1051    Subjective she relays no pain today, she feels ready for discharge to HEP    Pertinent History hyperlipidemia, HTN, hypothyroidism, DM 2, History Rt beast cancer    Patient Stated Goals Strengthen core and back, become painfree    Pain  Onset More than a month ago              [facility] PT Assessment - 09/03/20 0001      Assessment   Medical Diagnosis Chronic Midline low back pain M.54.50,G89.29    Referring Provider (PT) Bary Castilla, NP    Onset Date/Surgical Date 10/10/19      Observation/Other Assessments   Focus on Therapeutic Outcomes (FOTO)  69%, goal was 65%      AROM   Overall AROM Comments lumbar ROM WNL      Strength   Overall Strength Comments overall leg strength WFL      Standardized Balance Assessment   Five times sit to stand comments  12.9                          [facility] Adult PT Treatment/Exercise - 09/03/20 0001      Neuro Re-ed    Neuro Re-ed Details  tandem balance 30 sec X 2 bilat, SLS 15 sec  X2 bilat      Lumbar Exercises: Stretches   Lower Trunk Rotation 5 reps;10 seconds    Standing Extension 10 reps      Lumbar Exercises: Machines for Strengthening   Leg Press 75 lbs 3X12      Lumbar Exercises: Standing   Other Standing Lumbar Exercises antirotation walkouts  X10 bilat      Lumbar Exercises: Seated   Sit to Stand 15 reps      Lumbar Exercises: Supine   Bridge 15 reps;3 seconds                    PT Short Term Goals - 09/03/20 1053      PT SHORT TERM GOAL #1   Title Patient will be independent with HEP for improved strength, motion, and function.    Baseline HEP created 07/11/20    Time 3    Period Weeks    Status Achieved    Target Date 08/01/20      PT SHORT TERM GOAL #2   Title Will test patient balance and sest goals appropriately for increased stability and coordination    Baseline gait assessment    Time 2    Period Weeks    Status Achieved    Target Date 07/25/20             PT Long Term Goals - 09/03/20 1053      PT LONG TERM GOAL #1   Title Patient will demonstrate the ability to climb and descend stairs with confidence at least 2x a day for increased autonomy    Time 6    Period Weeks    Status Achieved      PT LONG TERM GOAL #2   Title Patient will demonstrate increased strength in hip abduction and hip extension bilaterally for improved function    Time 6    Period Weeks    Status Achieved      PT LONG TERM GOAL #3   Title Patient will decrease daily pain to 2/10 as baseline for increased ability for ADLs and leisure activities    Baseline no pain over last few days    Time 6    Period Weeks    Status Achieved      PT LONG TERM GOAL #4   Title Patient willimprove FOTO functional score to 65%    Time 6    Period Weeks  Status Achieved      PT  LONG TERM GOAL #5   Title Patient will improve 5xSTS to under 13 seconds    Baseline now 12.9    Time 6    Period Weeks    Status Achieved                 Plan - 09/03/20 1052    Clinical Impression Statement 10th visit progress note reflects she has mad excellent progress and is ready to discharge to HEP. HEP was progressed and reviewed with her and she showed good understanding. She had no further questions or concerns about discharge.    Personal Factors and Comorbidities Comorbidity 2;Comorbidity 1    Comorbidities hyperlipidemia, HTN, hypothyroidism, DM 2, History Rt beast cancer    Examination-Activity Limitations Sleep;Stairs;Other    Stability/Clinical Decision Making Stable/Uncomplicated    Rehab Potential Fair    PT Frequency 2x / week    PT Duration 6 weeks    PT Treatment/Interventions ADLs/Self Care Home Management;Gait training;Stair training;Functional mobility training;Therapeutic activities;Therapeutic exercise;Balance training;Neuromuscular re-education;Electrical Stimulation;Cryotherapy;Biofeedback;Moist Heat;Passive range of motion;Manual techniques;Vasopneumatic Device;Taping    PT Next Visit Plan DC    PT Home Exercise Plan Access Code: [email or web address]    Consulted and Agree with Plan of Care Patient           Patient will benefit from skilled therapeutic intervention in order to improve the following deficits and impairments:  Abnormal gait,Decreased activity tolerance,Difficulty walking,Decreased range of motion,Decreased coordination,Decreased endurance,Pain,Decreased balance,Decreased mobility,Decreased strength,Impaired flexibility  Visit Diagnosis: Muscle weakness (generalized)  Chronic midline low back pain without sciatica  Pain in left hip  Pain in right hip     Problem List Patient Active Problem List   Diagnosis Date Noted  . Atherosclerosis of aorta ([place]) 06/22/2020  . Impacted cerumen of right ear 06/22/2020  . Malignant neoplasm of  nipple of right breast in female, unspecified estrogen receptor status ([place]) 03/12/2020  . Primary localized osteoarthritis of right knee 04/17/2019  . Genetic testing 08/28/2018  . Pre-operative cardiovascular examination 08/24/2018  . Nonspecific abnormal electrocardiogram (ECG) (EKG) 08/24/2018  . Family history of ovarian cancer   . Family history of thyroid cancer   . Ductal carcinoma in situ (DCIS) of right breast 07/27/2018  . Pure hypercholesterolemia 05/10/2018  . Uncontrolled type 2 diabetes mellitus with hyperglycemia ([place]) 05/10/2018  . Essential hypertension 05/10/2018  . Bilateral primary osteoarthritis of knee 05/10/2018  . Hypothyroidism 05/10/2018  . H/O total thyroidectomy 09/08/2016  . Trigeminal neuralgia of left side of face 04/26/2014    Debbe Odea, PT,DPT 09/03/2020, 10:57 AM  [facility] [address] [place], [place], [address] Phone: [phone]   Fax:  [phone]  Name: Christina Hernandez MRN: [id number] Date of Birth: 09-10-1950

## 2020-09-03 NOTE — Patient Instructions (Signed)
Access Code: [email or web address] URL: [email or web address] Date: 09/03/2020 Prepared by: Elsie Ra  Exercises Supine Lower Trunk Rotation - 2 x daily - 6 x weekly - 1 sets - 10 reps - 5 sec hold Supine Bridge - 2 x daily - 6 x weekly - 10 reps - 1-2 sets - 5 hold Anti-Rotation Lateral Stepping with Press - 2 x daily - 3 x weekly - 1 sets - 10 reps Mini Squat with Counter Support - 2 x daily - 3 x weekly - 1-2 sets - 10 reps Standing Tandem Balance with Counter Support - 2 x daily - 3 x weekly - 1 sets - 2-3 reps - 30 hold Standing Single Leg Stance with Counter Support - 2 x daily - 3 x weekly - 1 sets - 2-3 reps - 15 sec hold Standing Lumbar Extension at [place] - 2 x daily - 6 x weekly - 1-2 sets - 10 reps

## 2020-09-08 ENCOUNTER — Other Ambulatory Visit: Payer: Medicare PPO

## 2020-09-10 ENCOUNTER — Encounter: Payer: Medicare PPO | Admitting: Physical Therapy

## 2020-09-14 ENCOUNTER — Other Ambulatory Visit: Payer: Self-pay | Admitting: Internal Medicine

## 2020-09-17 ENCOUNTER — Ambulatory Visit: Payer: Medicare PPO | Admitting: Internal Medicine

## 2020-09-19 ENCOUNTER — Encounter: Payer: Medicare PPO | Admitting: Physical Therapy

## 2020-09-29 ENCOUNTER — Other Ambulatory Visit: Payer: Self-pay | Admitting: Internal Medicine

## 2020-10-02 ENCOUNTER — Other Ambulatory Visit: Payer: Medicare PPO

## 2020-10-18 ENCOUNTER — Ambulatory Visit
Admission: RE | Admit: 2020-10-18 | Discharge: 2020-10-18 | Disposition: A | Discharge status: Home / Self Care | Payer: Medicare PPO | Source: Ambulatory Visit | Attending: Oncology | Admitting: Oncology

## 2020-10-18 ENCOUNTER — Other Ambulatory Visit: Payer: Self-pay

## 2020-10-18 DIAGNOSIS — D0511 Intraductal carcinoma in situ of right breast: Secondary | ICD-10-CM

## 2020-10-18 DIAGNOSIS — E1165 Type 2 diabetes mellitus with hyperglycemia: Secondary | ICD-10-CM

## 2020-10-18 DIAGNOSIS — Z780 Asymptomatic menopausal state: Secondary | ICD-10-CM | POA: Diagnosis not present

## 2020-10-18 DIAGNOSIS — E7800 Pure hypercholesterolemia, unspecified: Secondary | ICD-10-CM

## 2020-10-20 ENCOUNTER — Other Ambulatory Visit: Payer: Self-pay | Admitting: Internal Medicine

## 2020-11-12 ENCOUNTER — Other Ambulatory Visit: Payer: Self-pay | Admitting: Internal Medicine

## 2020-11-20 DIAGNOSIS — Z20822 Contact with and (suspected) exposure to covid-19: Secondary | ICD-10-CM | POA: Diagnosis not present

## 2020-11-21 ENCOUNTER — Encounter: Payer: Self-pay | Admitting: Internal Medicine

## 2020-11-25 ENCOUNTER — Telehealth: Payer: Self-pay

## 2020-11-25 NOTE — Telephone Encounter (Signed)
Voicemail not setup, called the pt to let her know that the office is out of synthroid 50 mcg samples.

## 2020-11-28 DIAGNOSIS — Z20822 Contact with and (suspected) exposure to covid-19: Secondary | ICD-10-CM | POA: Diagnosis not present

## 2020-12-02 ENCOUNTER — Other Ambulatory Visit: Payer: Self-pay | Admitting: Internal Medicine

## 2020-12-02 ENCOUNTER — Other Ambulatory Visit: Payer: Self-pay | Admitting: Diagnostic Neuroimaging

## 2020-12-08 ENCOUNTER — Other Ambulatory Visit: Payer: Self-pay | Admitting: Diagnostic Neuroimaging

## 2020-12-14 NOTE — Progress Notes (Signed)
[place]  [phone]) [phone] [phone]) [phone]     ID: Christina Hernandez DOB: 02-16-1951  MR#: [id number]  TSV#:[id number]  Patient Care Team: Glendale Chard, MD as PCP - General (Internal Medicine) [name], MD as PCP - Cardiology (Cardiology) [name], MD as Consulting Physician (Obstetrics and Gynecology) [name], MD as Consulting Physician (General Surgery) Harless Molinari, [name], MD as Consulting Physician (Oncology) [name], MD as Consulting Physician (Radiation Oncology) [name], Earlean Polka, MD as Consulting Physician (Neurology) [name], MD as Consulting Physician (Pulmonary Disease) [name], MD as Consulting Physician (Orthopedic Surgery) Clydell Hakim, MD (Inactive) as Consulting Physician (Anesthesiology) Chauncey Cruel, MD OTHER MD:   CHIEF COMPLAINT: Estrogen receptor positive breast cancer  CURRENT TREATMENT: Anastrozole   INTERVAL HISTORY: Christina Hernandez returns today for follow up of her noninvasive breast cancer.  She started on anastrozole on 04/29/2019.  She is tolerating this well, with occasional hot flashes (which she was not having before).  Since her last visit, she underwent bilateral diagnostic mammography with tomography at [facility] on 08/22/2020 showing: breast density category B; no evidence of malignancy in either breast.   She also underwent bone density screening on 10/18/2020 showing a T-score of +0.2, which is considered normal.  Of note, she tested positive for Covid-19 on 11/20/2020, despite having received 4 doses of the vaccine. She tested negative on 11/28/2020.   REVIEW OF SYSTEMS: Christina Hernandez tells me her COVID experience was "not bad".  She did take Paxlovid and says she had some taste alteration related to that.  She keeps a little bit of a dry cough which is very intermittent.  She does not exercise and she tells me when she walks up to 7 steps at home she stops and then  does the next 7 steps.  She does have a [employer] membership but she has not been going.  She is not checking her blood sugars and does not know what her A1c is.  A detailed review of systems today was otherwise stable.   COVID 19 VACCINATION STATUS: fully vaccinated, with Moderna x3 and most recent [place] booster 10/24/2020; infection 11/20/2020, s/p Paxlovid   HISTORY OF CURRENT ILLNESS: From the original intake note:  Christina Hernandez had routine screening mammography on 07/10/2018 showing a possible abnormality in the right breast. She underwent bilateral diagnostic mammography with tomography and right breast ultrasonography at [facility] on 07/21/2018 showing: indeterminate mass at the 3:30 position of the right breast measuring 3 x 4 x 4 mm; normal right axilla.  Accordingly on 07/24/2018 she proceeded to biopsy of the right breast area in question. The pathology ([id number]) from this procedure showed: ductal carcinoma in situ, low grade, may be involving an underlying papillary lesion. Prognostic indicators significant for: estrogen receptor, 100% positive and progesterone receptor, 100% positive or negative, both with strong staining intensity.   The patient's subsequent history is as detailed below.   PAST MEDICAL HISTORY: Past Medical History:  Diagnosis Date   Arthritis    Breast cancer, right breast ([place])    pending right breast lumpectomy on September 01, 2018   Diabetes mellitus type II, controlled ([place])    On Kombiglyze   Family history of ovarian cancer    Family history of thyroid cancer    GERD (gastroesophageal reflux disease)    rarely occurs- no meds   Hyperlipidemia associated with type 2 diabetes mellitus ([place])    Hypertension    Hypothyroidism  Personal history of radiation therapy    Primary localized osteoarthritis of right knee 04/17/2019   S/P knee replacement 04/17/2019   Trigeminal neuralgia of left side of face 2015   s/p Gamma knife  She has a stable  lung nodule   PAST SURGICAL HISTORY: Past Surgical History:  Procedure Laterality Date   BREAST BIOPSY Right 07/24/2018   BREAST LUMPECTOMY Right 09/01/2018   BREAST LUMPECTOMY WITH RADIOACTIVE SEED LOCALIZATION Right 09/01/2018   Procedure: RIGHT BREAST LUMPECTOMY WITH RADIOACTIVE SEED LOCALIZATION;  Surgeon: [name], MD;  Location: [facility];  Service: General;  Laterality: Right;   COLONOSCOPY     Gamma Knife Left 04/08/2016   HYSTEROSCOPY WITH D & C  05/24/2011   Procedure: DILATATION AND CURETTAGE (D&C) /HYSTEROSCOPY;  Surgeon: [name], MD;  Location: [facility];  Service: Gynecology;  Laterality: N/A;   KNEE SURGERY Right 06/10/15   torn ligament   PARTIAL KNEE ARTHROPLASTY Right 04/17/2019   Procedure: UNICOMPARTMENTAL KNEE;  Surgeon: [name], MD;  Location: [facility];  Service: Orthopedics;  Laterality: Right;   THYROIDECTOMY N/A 09/08/2016   Procedure: TOTAL THYROIDECTOMY;  Surgeon: [name], MD;  Location: [facility];  Service: ENT;  Laterality: N/A;   TUBAL LIGATION     uterine polyp     WISDOM TOOTH EXTRACTION Left 2014    FAMILY HISTORY Family History  Problem Relation Age of Onset   Hypertension Mother    Kidney failure Mother    Congestive Heart Failure Mother    Heart Problems Father    Stroke Father    Pneumonia Father    Heart Problems Sister 74       heart transplant   Stroke Paternal Grandmother    Dementia Paternal Grandmother    Stroke Paternal Grandfather    Cardiomyopathy Sister    Ovarian cancer Sister 89       d. 34   Thyroid disease Sister    Thyroid disease Sister    Thyroid cancer Niece 65   Breast cancer Neg Hx   (as of 08/02/2018) Patient father was 89 years old when he died from pneumonia and a stroke. Patient mother died from possible heart attack at age 50.  The patient denies a family hx of breast cancer. A sister was diagnosed with ovarian cancer, and has since passed away at age 71.  The patient has 5 siblings, 5  sisters and 0 brothers.   GYNECOLOGIC HISTORY:  No LMP recorded. Patient is postmenopausal. Menarche: 70 years old Age at first live birth: 70 years old [place] P 4 LMP 1996 Contraceptive yes HRT no  Hysterectomy? Yes, 5-6 years ago (per patient) BSO? 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 Husband [name] is a retired English as a second language teacher. [name], age 57, is a NP for an opioid clinic in [place]. Son [name], age 51, is a Nature conservation officer in [place]. Daughter [name], age 63, works as a Software engineer for [employer]. Son [name], age 72, works in Land in [place].  The patient has no grandchildren however she has a foster child who is currently 39.  The patient belongs to [place] DIRECTIVES: Husband [name] is her HCPOA.   HEALTH MAINTENANCE: Social History   Tobacco Use   Smoking status: Never   Smokeless tobacco: Never  Vaping Use   Vaping Use: Never used  Substance Use Topics   Alcohol use: No   Drug use: No  Colonoscopy: 2019, Dr. Collene Mares  PAP: 03/21/2011, normal  Bone density: Pending   Allergies  Allergen Reactions   Cyclobenzaprine Nausea Only    Current Outpatient Medications  Medication Sig Dispense Refill   Accu-Chek FastClix Lancets MISC Use as instructed to check blood sugars 1 time per day dx: e11.22 150 each 2   anastrozole (ARIMIDEX) 1 MG tablet TAKE 1 TABLET (1 MG TOTAL) BY MOUTH DAILY. START Apr 29, 2019 90 tablet 4   baclofen (LIORESAL) 10 MG tablet TAKE 1 TABLET BY MOUTH THREE TIMES A DAY AS NEEDED FOR MUSCLE SPASMS 270 tablet 1   cetirizine (ZYRTEC) 10 MG tablet Take 1 tablet (10 mg total) by mouth daily. 30 tablet 3   Cholecalciferol (VITAMIN D) 125 MCG (5000 UT) CAPS Take 5,000 Units by mouth daily.     COMBIGAN 0.2-0.5 % ophthalmic solution Place 1 drop into both eyes 2 (two) times daily.     DULoxetine (CYMBALTA) 60 MG capsule Take 1 capsule (60 mg total) by mouth daily. 90 capsule 4    gabapentin (NEURONTIN) 300 MG capsule Take 3 capsules (900 mg total) by mouth 3 (three) times daily. 810 capsule 3   KOMBIGLYZE XR 5-500 MG TB24 TAKE 1 TABLET BY MOUTH DAILY BEFORE SUPPER. 90 tablet 1   latanoprost (XALATAN) 0.005 % ophthalmic solution Place 1 drop into both eyes at bedtime.     levothyroxine (SYNTHROID) 75 MCG tablet Take 1 tablet by mouth on Sat - Sunday only 90 tablet 1   losartan-hydrochlorothiazide (HYZAAR) 50-12.5 MG tablet TAKE 1 TABLET BY MOUTH EVERY DAY 90 tablet 1   ondansetron (ZOFRAN) 4 MG tablet Take 1 tablet (4 mg total) by mouth every 8 (eight) hours as needed for nausea or vomiting. (Patient not taking: Reported on 06/11/2020) 10 tablet 0   pravastatin (PRAVACHOL) 80 MG tablet TAKE ONE TAB PO M-F, SKIP SAT/SUN 75 tablet 2   No current facility-administered medications for this visit.    OBJECTIVE: African-American woman in no acute distress  Vitals:   12/15/20 1114  BP: (!) 157/73  Pulse: 98  Resp: 19  Temp: 97.7 F (36.5 C)  SpO2: 100%    Wt Readings from Last 3 Encounters:  12/15/20 177 lb 11.2 oz (80.6 kg)  06/19/20 174 lb 9.6 oz (79.2 kg)  06/11/20 172 lb 12.8 oz (78.4 kg)   Body mass index is 29.75 kg/m.    Sclerae unicteric, EOMs intact Wearing a mask No cervical or supraclavicular adenopathy Lungs no rales or rhonchi Heart regular rate and rhythm Abd soft, nontender, positive bowel sounds MSK no focal spinal tenderness, no upper extremity lymphedema Neuro: nonfocal, well oriented, appropriate affect Breasts: The right breast is status postlumpectomy and radiation.  There is no evidence of local recurrence.  The left breast is benign.  Both axillae are benign.   LAB RESULTS:  CMP     Component Value Date/Time   NA 143 06/19/2020 1556   K 3.8 06/19/2020 1556   CL 102 06/19/2020 1556   CO2 25 06/19/2020 1556   GLUCOSE 118 (H) 06/19/2020 1556   GLUCOSE 138 (H) 04/18/2019 0300   BUN 16 06/19/2020 1556   CREATININE 0.61  06/19/2020 1556   CREATININE 0.77 08/02/2018 0835   CALCIUM 9.7 06/19/2020 1556   PROT 7.5 03/12/2020 1300   ALBUMIN 4.6 03/12/2020 1300   AST 20 03/12/2020 1300   AST 18 08/02/2018 0835   ALT 20 04/29/2020 1521   ALT 22 08/02/2018 0835   ALKPHOS 117  03/12/2020 1300   BILITOT 0.8 03/12/2020 1300   BILITOT 0.7 08/02/2018 0835   GFRNONAA 92 06/19/2020 1556   GFRNONAA >60 08/02/2018 0835   GFRAA 105 06/19/2020 1556   GFRAA >60 08/02/2018 0835    No results found for: TOTALPROTELP, ALBUMINELP, A1GS, A2GS, BETS, BETA2SER, GAMS, MSPIKE, SPEI  No results found for: KPAFRELGTCHN, LAMBDASER, [facility]  Lab Results  Component Value Date   WBC 5.9 06/19/2020   NEUTROABS 2.8 08/02/2018   HGB 12.7 06/19/2020   HCT 38.5 06/19/2020   MCV 82 06/19/2020   PLT 295 06/19/2020   No results found for: LABCA2  No components found for: SWHQPR916  No results for input(s): INR in the last 168 hours.  No results found for: LABCA2  No results found for: BWG665  No results found for: LDJ570  No results found for: VXB939  No results found for: CA2729  No components found for: HGQUANT  No results found for: CEA1 / No results found for: CEA1   No results found for: AFPTUMOR  No results found for: CHROMOGRNA  No results found for: HGBA, HGBA2QUANT, HGBFQUANT, HGBSQUAN (Hemoglobinopathy evaluation)   No results found for: LDH  No results found for: IRON, TIBC, IRONPCTSAT (Iron and TIBC)  No results found for: FERRITIN  Urinalysis    Component Value Date/Time   COLORURINE YELLOW 08/17/2007 1404   APPEARANCEUR CLEAR 08/17/2007 1404   LABSPEC 1.010 08/17/2007 1404   PHURINE 7.0 08/17/2007 1404   GLUCOSEU NEGATIVE 08/17/2007 1404   HGBUR NEGATIVE 08/17/2007 1404   BILIRUBINUR negative 03/12/2020 1258   KETONESUR negative 04/12/2016 [place] 08/17/2007 1404   PROTEINUR Negative 03/12/2020 [place] 08/17/2007 1404   UROBILINOGEN 0.2  03/12/2020 1258   UROBILINOGEN 0.2 08/17/2007 1404   NITRITE negative 03/12/2020 1258   NITRITE NEGATIVE 08/17/2007 1404   LEUKOCYTESUR Negative 03/12/2020 1258    STUDIES: No results found.   ELIGIBLE FOR AVAILABLE RESEARCH PROTOCOL: no  ASSESSMENT: 70 y.o. [place] woman status post right breast biopsy 07/24/2018 for ductal carcinoma in situ, grade 1, strongly estrogen and progesterone receptor positive.  (1) status post right lumpectomy 09/01/2018 for ductal carcinoma in situ, 5 mm, with negative margins  (2) adjuvant radiation 10/16/2018 - 11/10/2018  (a) Right Breast was treated to 42.56 Gy in 16 fractions, followed by a boost of 8 Gy in 4 fractions.  (3) started anastrozole 04/29/2019  (a) bone density 10/18/2020 showed a T score of 0.3 (normal).  (4) genetics testing 08/27/2018 on the STAT panel and the common hereditary cancer gene paneloffered by [facility] found no deleterious mutations in APC, ATM, AXIN2, BARD1, BMPR1A, BRCA1, BRCA2, BRIP1, CDH1, CDK4, CDKN2A (p14ARF), CDKN2A (p16INK4a), CHEK2, CTNNA1, DICER1, EPCAM (Deletion/duplication testing only), GREM1 (promoter region deletion/duplication testing only), KIT, MEN1, MLH1, MSH2, MSH3, MSH6, MUTYH, NBN, NF1, NHTL1, PALB2, PDGFRA, PMS2, POLD1, POLE, PTEN, RAD50, RAD51C, RAD51D, SDHB, SDHC, SDHD, SMAD4, SMARCA4. STK11, TP53, TSC1, TSC2, and VHL.  The following genes were evaluated for sequence changes only: SDHA and HOXB13 c.251G>A variant only.    PLAN: Christina Hernandez is now a little over 2 years out from definitive surgery for her breast cancer with no evidence of disease recurrence.  This is very favorable.  She is tolerating anastrozole well and the plan is to continue that a total of 5 years.  She is not monitoring her blood sugar at all and I have encouraged her to discuss this with her primary care physician.  She is also not  exercising.  I have encouraged her to participate at the [facility] program, which is specific for  cancer patients and I think she would greatly enjoyed it.  Otherwise she will return to see [place] in 1 year.  She knows to call for any other issue that may develop before the next visit  Total encounter time 25 minutes.*   Ainara Eldridge, [name], MD  12/15/20 11:28 AM Medical Oncology and Hematology [facility] [place], [place] [address] Tel. [phone]    Fax. [phone]   I, Wilburn Mylar, am acting as scribe for Dr. [name]. Christina Hernandez.  I, Lurline Del MD, have reviewed the above documentation for accuracy and completeness, and I agree with the above.   *Total Encounter Time as defined by the Centers for Medicare and Medicaid Services includes, in addition to the face-to-face time of a patient visit (documented in the note above) non-face-to-face time: obtaining and reviewing outside history, ordering and reviewing medications, tests or procedures, care coordination (communications with other health care professionals or caregivers) and documentation in the medical record.

## 2020-12-15 ENCOUNTER — Inpatient Hospital Stay: Payer: Medicare PPO | Attending: Oncology | Admitting: Oncology

## 2020-12-15 ENCOUNTER — Other Ambulatory Visit: Payer: Self-pay

## 2020-12-15 VITALS — BP 157/73 | HR 98 | Temp 97.7°F | Resp 19 | Wt 177.7 lb

## 2020-12-15 DIAGNOSIS — C50011 Malignant neoplasm of nipple and areola, right female breast: Secondary | ICD-10-CM

## 2020-12-15 DIAGNOSIS — Z923 Personal history of irradiation: Secondary | ICD-10-CM | POA: Diagnosis not present

## 2020-12-15 DIAGNOSIS — E1165 Type 2 diabetes mellitus with hyperglycemia: Secondary | ICD-10-CM

## 2020-12-15 DIAGNOSIS — D0511 Intraductal carcinoma in situ of right breast: Secondary | ICD-10-CM | POA: Diagnosis not present

## 2020-12-15 DIAGNOSIS — Z8616 Personal history of COVID-19: Secondary | ICD-10-CM | POA: Insufficient documentation

## 2020-12-15 MED ORDER — ANASTROZOLE 1 MG PO TABS: 1.0000 mg | ORAL_TABLET | Freq: Every day | ORAL | 4 refills | Status: DC

## 2020-12-16 ENCOUNTER — Telehealth: Payer: Self-pay | Admitting: Oncology

## 2020-12-16 DIAGNOSIS — E119 Type 2 diabetes mellitus without complications: Secondary | ICD-10-CM | POA: Diagnosis not present

## 2020-12-16 DIAGNOSIS — H2513 Age-related nuclear cataract, bilateral: Secondary | ICD-10-CM | POA: Diagnosis not present

## 2020-12-16 DIAGNOSIS — G500 Trigeminal neuralgia: Secondary | ICD-10-CM | POA: Diagnosis not present

## 2020-12-16 DIAGNOSIS — H02403 Unspecified ptosis of bilateral eyelids: Secondary | ICD-10-CM | POA: Diagnosis not present

## 2020-12-16 DIAGNOSIS — H401133 Primary open-angle glaucoma, bilateral, severe stage: Secondary | ICD-10-CM | POA: Diagnosis not present

## 2020-12-16 LAB — HM DIABETES EYE EXAM

## 2020-12-16 NOTE — Telephone Encounter (Signed)
Scheduled appointment per 06/20 los. Left message

## 2021-01-06 ENCOUNTER — Other Ambulatory Visit: Payer: Self-pay | Admitting: Diagnostic Neuroimaging

## 2021-01-07 ENCOUNTER — Other Ambulatory Visit: Payer: Self-pay | Admitting: Internal Medicine

## 2021-01-07 MED ORDER — BACLOFEN 10 MG PO TABS: ORAL_TABLET | ORAL | 0 refills | Status: DC

## 2021-01-12 ENCOUNTER — Encounter: Payer: Self-pay | Admitting: Internal Medicine

## 2021-01-12 ENCOUNTER — Other Ambulatory Visit: Payer: Self-pay

## 2021-01-12 ENCOUNTER — Ambulatory Visit: Payer: Medicare PPO | Admitting: Internal Medicine

## 2021-01-12 VITALS — BP 134/70 | HR 89 | Temp 98.1°F | Ht 64.8 in | Wt 178.4 lb

## 2021-01-12 DIAGNOSIS — K146 Glossodynia: Secondary | ICD-10-CM | POA: Insufficient documentation

## 2021-01-12 DIAGNOSIS — K148 Other diseases of tongue: Secondary | ICD-10-CM

## 2021-01-12 DIAGNOSIS — G500 Trigeminal neuralgia: Secondary | ICD-10-CM | POA: Diagnosis not present

## 2021-01-12 DIAGNOSIS — E890 Postprocedural hypothyroidism: Secondary | ICD-10-CM

## 2021-01-12 DIAGNOSIS — E1165 Type 2 diabetes mellitus with hyperglycemia: Secondary | ICD-10-CM

## 2021-01-12 DIAGNOSIS — I700 Atherosclerosis of aorta: Secondary | ICD-10-CM

## 2021-01-12 DIAGNOSIS — F458 Other somatoform disorders: Secondary | ICD-10-CM | POA: Diagnosis not present

## 2021-01-12 NOTE — Progress Notes (Signed)
I,Katawbba Wiggins,acting as a Education administrator for [name], MD.,have documented all relevant documentation on the behalf of [name], MD,as directed by  [name], MD while in the presence of [name], MD.  This visit occurred during the SARS-CoV-2 public health emergency.  Safety protocols were in place, including screening questions prior to the visit, additional usage of staff PPE, and extensive cleaning of exam room while observing appropriate contact time as indicated for disinfecting solutions.  Subjective:     Patient ID: Christina Hernandez , female    DOB: [birth year] , 70 y.o.   MRN: [id number]   Chief Complaint  Patient presents with   Oral Pain    tongue   Diabetes   Hypertension    HPI  She presentes today for further evaluation of tongue pain. She reports seeing a white lesion on her tongue. First noticed years ago, but it has started to hurt in the past six months. She is not sure if she is biting her tongue in her sleep. She does have h/o bruxism. She has never received a night guard from her dentist.   Regarding DM - she has not taken Kombiglyze this week b/c she lost them. Additionally, she was given eye report from her ophthalmologist, but she needs to locate it. Her last appt for DM check was Dec 2021.   Oral Pain  This is a recurrent problem. The current episode started more than 1 month ago. The problem occurs daily. The problem has been unchanged. The pain is at a severity of 5/10. The pain is moderate. Associated symptoms include facial pain. Pertinent negatives include no difficulty swallowing or fever.  Diabetes She presents for her follow-up diabetic visit. She has type 2 diabetes mellitus. There are no hypoglycemic associated symptoms. There are no diabetic associated symptoms. There are no hypoglycemic complications. Risk factors for coronary artery disease include diabetes mellitus, dyslipidemia, hypertension, sedentary lifestyle and  post-menopausal. She participates in exercise intermittently. An ACE inhibitor/angiotensin II receptor blocker is being taken. Eye exam is current.    Past Medical History:  Diagnosis Date   Arthritis    Breast cancer, right breast ([place])    pending right breast lumpectomy on September 01, 2018   Diabetes mellitus type II, controlled ([place])    On Kombiglyze   Family history of ovarian cancer    Family history of thyroid cancer    GERD (gastroesophageal reflux disease)    rarely occurs- no meds   Hyperlipidemia associated with type 2 diabetes mellitus ([place])    Hypertension    Hypothyroidism    Personal history of radiation therapy    Primary localized osteoarthritis of right knee 04/17/2019   S/P knee replacement 04/17/2019   Trigeminal neuralgia of left side of face 2015   s/p Gamma knife     Family History  Problem Relation Age of Onset   Hypertension Mother    Kidney failure Mother    Congestive Heart Failure Mother    Heart Problems Father    Stroke Father    Pneumonia Father    Heart Problems Sister 97       heart transplant   Stroke Paternal Grandmother    Dementia Paternal Grandmother    Stroke Paternal Grandfather    Cardiomyopathy Sister    Ovarian cancer Sister 61       d. 62   Thyroid disease Sister    Thyroid disease Sister    Thyroid cancer Niece 67  Breast cancer Neg Hx      Current Outpatient Medications:    Accu-Chek FastClix Lancets MISC, Use as instructed to check blood sugars 1 time per day dx: e11.22, Disp: 150 each, Rfl: 2   anastrozole (ARIMIDEX) 1 MG tablet, Take 1 tablet (1 mg total) by mouth daily. Start Apr 29, 2019, Disp: 90 tablet, Rfl: 4   baclofen (LIORESAL) 10 MG tablet, TAKE 1 TABLET BY MOUTH THREE TIMES A DAY AS NEEDED FOR MUSCLE SPASMS, Disp: 270 tablet, Rfl: 0   Cholecalciferol (VITAMIN D) 125 MCG (5000 UT) CAPS, Take 5,000 Units by mouth daily., Disp: , Rfl:    COMBIGAN 0.2-0.5 % ophthalmic solution, Place 1 drop into both eyes 2 (two)  times daily., Disp: , Rfl:    DULoxetine (CYMBALTA) 60 MG capsule, Take 1 capsule (60 mg total) by mouth daily., Disp: 90 capsule, Rfl: 4   gabapentin (NEURONTIN) 300 MG capsule, Take 3 capsules (900 mg total) by mouth 3 (three) times daily., Disp: 810 capsule, Rfl: 3   KOMBIGLYZE XR 5-500 MG TB24, TAKE 1 TABLET BY MOUTH DAILY BEFORE SUPPER., Disp: 90 tablet, Rfl: 1   latanoprost (XALATAN) 0.005 % ophthalmic solution, Place 1 drop into both eyes at bedtime., Disp: , Rfl:    levothyroxine (SYNTHROID) 75 MCG tablet, Take 1 tablet by mouth on Sat - Sunday only, Disp: 90 tablet, Rfl: 1   losartan-hydrochlorothiazide (HYZAAR) 50-12.5 MG tablet, TAKE 1 TABLET BY MOUTH EVERY DAY, Disp: 90 tablet, Rfl: 1   pravastatin (PRAVACHOL) 80 MG tablet, TAKE ONE TAB PO M-F, SKIP SAT/SUN, Disp: 75 tablet, Rfl: 2   Allergies  Allergen Reactions   Cyclobenzaprine Nausea Only     Review of Systems  Constitutional: Negative.  Negative for fever.  HENT:         Tongue   Respiratory: Negative.    Cardiovascular: Negative.   Gastrointestinal: Negative.   Psychiatric/Behavioral: Negative.    All other systems reviewed and are negative.   Today's Vitals   01/12/21 1159  BP: 134/70  Pulse: 89  Temp: 98.1 F (36.7 C)  TempSrc: Oral  Weight: 178 lb 6.4 oz (80.9 kg)  Height: 5' 4.8" (1.646 m)  PainSc: 1   PainLoc: Mouth   Body mass index is 29.87 kg/m.  Wt Readings from Last 3 Encounters:  01/12/21 178 lb 6.4 oz (80.9 kg)  12/15/20 177 lb 11.2 oz (80.6 kg)  06/19/20 174 lb 9.6 oz (79.2 kg)    BP Readings from Last 3 Encounters:  01/12/21 134/70  12/15/20 (!) 157/73  06/19/20 124/62    Objective:  Physical Exam Vitals and nursing note reviewed.  Constitutional:      Appearance: Normal appearance.  HENT:     Head: Normocephalic and atraumatic.     Nose:     Comments: Masked     Mouth/Throat:     Tongue: Lesions present.     Comments: White lesion on left lateral tongue Eyes:      Extraocular Movements: Extraocular movements intact.  Cardiovascular:     Rate and Rhythm: Normal rate and regular rhythm.     Heart sounds: Normal heart sounds.  Pulmonary:     Effort: Pulmonary effort is normal.     Breath sounds: Normal breath sounds.  Musculoskeletal:     Cervical back: Normal range of motion.  Skin:    General: Skin is warm.  Neurological:     General: No focal deficit present.     Mental Status: She is  alert.  Psychiatric:        Mood and Affect: Mood normal.        Behavior: Behavior normal.        Assessment And Plan:     1. Lesion of tongue Comments: I will refer her to ENT for further evaluation and treatment. She is in agreement w/ tx plan.  - Ambulatory referral to ENT  2. Bruxism Comments: She is encouraged to f/u w/ her dentist to get a nightguard.   3. Uncontrolled type 2 diabetes mellitus with hyperglycemia (HCC) Comments: Chronic. I will check labs as listed below. Encouraged to keep MD appts every 3-4 months for DM mgmt.  - CMP14+EGFR - CBC - Lipid panel - Hemoglobin A1c  4. Trigeminal neuralgia of left side of face Comments: Chronic.   5. Postoperative hypothyroidism Comments: I will check thyroid panel and adjust meds as needed.  - TSH + free T4  6. Atherosclerosis of aorta (HCC) Comments: Importance of statin compliance was discussed with the patient. Encouraged to follow a heart healthy diet .    Patient was given opportunity to ask questions. Patient verbalized understanding of the plan and was able to repeat key elements of the plan. All questions were answered to their satisfaction.   I, [name], MD, have reviewed all documentation for this visit. The documentation on 01/12/21 for the exam, diagnosis, procedures, and orders are all accurate and complete.   IF YOU HAVE BEEN REFERRED TO A SPECIALIST, IT MAY TAKE 1-2 WEEKS TO SCHEDULE/PROCESS THE REFERRAL. IF YOU HAVE NOT HEARD FROM US/SPECIALIST IN TWO WEEKS, PLEASE  GIVE [place] A CALL AT [phone] X [phone].   THE PATIENT IS ENCOURAGED TO PRACTICE SOCIAL DISTANCING DUE TO THE COVID-19 PANDEMIC.

## 2021-01-13 LAB — CMP14+EGFR
ALT: 31 IU/L (ref 0–32)
AST: 28 IU/L (ref 0–40)
Albumin/Globulin Ratio: 1.7 (ref 1.2–2.2)
Albumin: 4.5 g/dL (ref 3.7–4.7)
Alkaline Phosphatase: 132 IU/L — ABNORMAL HIGH (ref 44–121)
BUN/Creatinine Ratio: 20 (ref 12–28)
BUN: 13 mg/dL (ref 8–27)
Bilirubin Total: 0.5 mg/dL (ref 0.0–1.2)
CO2: 25 mmol/L (ref 20–29)
Calcium: 10 mg/dL (ref 8.7–10.3)
Chloride: 102 mmol/L (ref 96–106)
Creatinine, Ser: 0.65 mg/dL (ref 0.57–1.00)
Globulin, Total: 2.7 g/dL (ref 1.5–4.5)
Glucose: 115 mg/dL — ABNORMAL HIGH (ref 65–99)
Potassium: 4.1 mmol/L (ref 3.5–5.2)
Sodium: 140 mmol/L (ref 134–144)
Total Protein: 7.2 g/dL (ref 6.0–8.5)
eGFR: 94 mL/min/{1.73_m2} (ref >59)

## 2021-01-13 LAB — HEMOGLOBIN A1C
Est. average glucose Bld gHb Est-mCnc: 140 mg/dL
Hgb A1c MFr Bld: 6.5 % — ABNORMAL HIGH (ref 4.8–5.6)

## 2021-01-13 LAB — CBC
Hematocrit: 38.3 % (ref 34.0–46.6)
Hemoglobin: 12.5 g/dL (ref 11.1–15.9)
MCH: 26.9 pg (ref 26.6–33.0)
MCHC: 32.6 g/dL (ref 31.5–35.7)
MCV: 82 fL (ref 79–97)
Platelets: 291 10*3/uL (ref 150–450)
RBC: 4.65 x10E6/uL (ref 3.77–5.28)
RDW: 14.4 % (ref 11.7–15.4)
WBC: 5.1 10*3/uL (ref 3.4–10.8)

## 2021-01-13 LAB — TSH+FREE T4
Free T4: 1.04 ng/dL (ref 0.82–1.77)
TSH: 0.658 u[IU]/mL (ref 0.450–4.500)

## 2021-01-13 LAB — LIPID PANEL
Chol/HDL Ratio: 3.6 ratio (ref 0.0–4.4)
Cholesterol, Total: 204 mg/dL — ABNORMAL HIGH (ref 100–199)
HDL: 56 mg/dL (ref >39)
LDL Chol Calc (NIH): 127 mg/dL — ABNORMAL HIGH (ref 0–99)
Triglycerides: 119 mg/dL (ref 0–149)
VLDL Cholesterol Cal: 21 mg/dL (ref 5–40)

## 2021-01-14 ENCOUNTER — Encounter: Payer: Self-pay | Admitting: Internal Medicine

## 2021-01-16 DIAGNOSIS — K1230 Oral mucositis (ulcerative), unspecified: Secondary | ICD-10-CM | POA: Diagnosis not present

## 2021-02-05 ENCOUNTER — Other Ambulatory Visit: Payer: Self-pay | Admitting: Diagnostic Neuroimaging

## 2021-02-26 ENCOUNTER — Encounter (HOSPITAL_COMMUNITY): Payer: Self-pay

## 2021-02-26 ENCOUNTER — Other Ambulatory Visit: Payer: Self-pay

## 2021-02-26 ENCOUNTER — Ambulatory Visit (INDEPENDENT_AMBULATORY_CARE_PROVIDER_SITE_OTHER): Payer: Medicare PPO

## 2021-02-26 ENCOUNTER — Ambulatory Visit (HOSPITAL_COMMUNITY)
Admission: EM | Admit: 2021-02-26 | Discharge: 2021-02-26 | Disposition: A | Discharge status: Home / Self Care | Payer: Medicare PPO | Attending: Medical Oncology | Admitting: Medical Oncology

## 2021-02-26 DIAGNOSIS — R059 Cough, unspecified: Secondary | ICD-10-CM

## 2021-02-26 DIAGNOSIS — E118 Type 2 diabetes mellitus with unspecified complications: Secondary | ICD-10-CM

## 2021-02-26 DIAGNOSIS — S90851A Superficial foreign body, right foot, initial encounter: Secondary | ICD-10-CM | POA: Diagnosis not present

## 2021-02-26 DIAGNOSIS — M79671 Pain in right foot: Secondary | ICD-10-CM

## 2021-02-26 DIAGNOSIS — M19071 Primary osteoarthritis, right ankle and foot: Secondary | ICD-10-CM | POA: Diagnosis not present

## 2021-02-26 DIAGNOSIS — Z8639 Personal history of other endocrine, nutritional and metabolic disease: Secondary | ICD-10-CM

## 2021-02-26 DIAGNOSIS — Z20822 Contact with and (suspected) exposure to covid-19: Secondary | ICD-10-CM | POA: Diagnosis not present

## 2021-02-26 LAB — CBG MONITORING, ED: Glucose-Capillary: 81 mg/dL (ref 70–99)

## 2021-02-26 MED ORDER — BENZONATATE 100 MG PO CAPS: 100.0000 mg | ORAL_CAPSULE | Freq: Three times a day (TID) | ORAL | 0 refills | Status: DC

## 2021-02-26 NOTE — ED Provider Notes (Signed)
[place]    CSN: [id number] Arrival date & time: 02/26/21  1323      History   Chief Complaint Chief Complaint  Patient presents with   Cough   Oral Swelling    Lip    Foreign Body    Glass in foot     HPI Christina Hernandez is a 70 y.o. female.   HPI  Cough: Pt reports that for the past 2-3 weeks she has had a dry cough. She requests a chest x ray. She has not tried anything for symptoms. No fever, SOB, chest pain or hemoptysis. In addition she has had some swelling of her upper lip for this same period of time. NO new medications, products or foods. She denies trouble breathing or swallowing.   Foot Pain: She reports that a few weeks ago she stepped on glass and since has had foot pain of her right mid bottom foot. She requests an x ray and CGB glucose monitoring as she "wants to know what her glucose is today".   Past Medical History:  Diagnosis Date   Arthritis    Breast cancer, right breast ([place])    pending right breast lumpectomy on September 01, 2018   Diabetes mellitus type II, controlled ([place])    On Kombiglyze   Family history of ovarian cancer    Family history of thyroid cancer    GERD (gastroesophageal reflux disease)    rarely occurs- no meds   Hyperlipidemia associated with type 2 diabetes mellitus ([place])    Hypertension    Hypothyroidism    Personal history of radiation therapy    Primary localized osteoarthritis of right knee 04/17/2019   S/P knee replacement 04/17/2019   Trigeminal neuralgia of left side of face 2015   s/p Gamma knife    Patient Active Problem List   Diagnosis Date Noted   Glossodynia 01/12/2021   Bruxism 01/12/2021   Atherosclerosis of aorta ([place]) 06/22/2020   Impacted cerumen of right ear 06/22/2020   Malignant neoplasm of nipple of right breast in female, unspecified estrogen receptor status ([place]) 03/12/2020   Primary localized osteoarthritis of right knee 04/17/2019   Genetic testing 08/28/2018    Pre-operative cardiovascular examination 08/24/2018   Nonspecific abnormal electrocardiogram (ECG) (EKG) 08/24/2018   Family history of ovarian cancer    Family history of thyroid cancer    Ductal carcinoma in situ (DCIS) of right breast 07/27/2018   Pure hypercholesterolemia 05/10/2018   Uncontrolled type 2 diabetes mellitus with hyperglycemia ([place]) 05/10/2018   Essential hypertension 05/10/2018   Bilateral primary osteoarthritis of knee 05/10/2018   Hypothyroidism 05/10/2018   H/O total thyroidectomy 09/08/2016   Trigeminal neuralgia of left side of face 04/26/2014    Past Surgical History:  Procedure Laterality Date   BREAST BIOPSY Right 07/24/2018   BREAST LUMPECTOMY Right 09/01/2018   BREAST LUMPECTOMY WITH RADIOACTIVE SEED LOCALIZATION Right 09/01/2018   Procedure: RIGHT BREAST LUMPECTOMY WITH RADIOACTIVE SEED LOCALIZATION;  Surgeon: [name], MD;  Location: [facility];  Service: General;  Laterality: Right;   COLONOSCOPY     Gamma Knife Left 04/08/2016   HYSTEROSCOPY WITH D & C  05/24/2011   Procedure: DILATATION AND CURETTAGE (D&C) /HYSTEROSCOPY;  Surgeon: [name], MD;  Location: [facility];  Service: Gynecology;  Laterality: N/A;   KNEE SURGERY Right 06/10/15   torn ligament   PARTIAL KNEE ARTHROPLASTY Right 04/17/2019   Procedure: UNICOMPARTMENTAL KNEE;  Surgeon: [name], MD;  Location: [facility];  Service: Orthopedics;  Laterality: Right;   THYROIDECTOMY N/A 09/08/2016   Procedure: TOTAL THYROIDECTOMY;  Surgeon: [name], MD;  Location: [facility];  Service: ENT;  Laterality: N/A;   TUBAL LIGATION     uterine polyp     WISDOM TOOTH EXTRACTION Left 2014    OB History   No obstetric history on file.      Home Medications    Prior to Admission medications   Medication Sig Start Date End Date Taking? Authorizing Provider  Accu-Chek FastClix Lancets MISC Use as instructed to check blood sugars 1 time per day dx: e11.22 09/17/19   Glendale Chard, MD   anastrozole (ARIMIDEX) 1 MG tablet Take 1 tablet (1 mg total) by mouth daily. Start Apr 29, 2019 12/15/20   Magrinat, [name], MD  baclofen (LIORESAL) 10 MG tablet TAKE 1 TABLET BY MOUTH THREE TIMES A DAY AS NEEDED FOR MUSCLE SPASMS 01/07/21   Glendale Chard, MD  Cholecalciferol (VITAMIN D) 125 MCG (5000 UT) CAPS Take 5,000 Units by mouth daily.    [provider]  COMBIGAN 0.2-0.5 % ophthalmic solution Place 1 drop into both eyes 2 (two) times daily. 03/15/19   [provider]  DULoxetine (CYMBALTA) 60 MG capsule Take 1 capsule (60 mg total) by mouth daily. 10/22/19   Penumalli, Earlean Polka, MD  gabapentin (NEURONTIN) 300 MG capsule Take 3 capsules (900 mg total) by mouth 3 (three) times daily. 04/23/20   Penumalli, Earlean Polka, MD  KOMBIGLYZE XR 5-500 MG TB24 TAKE 1 TABLET BY MOUTH DAILY BEFORE SUPPER. 08/25/20   Glendale Chard, MD  latanoprost (XALATAN) 0.005 % ophthalmic solution Place 1 drop into both eyes at bedtime. 04/01/19   [provider]  levothyroxine (SYNTHROID) 75 MCG tablet Take 1 tablet by mouth on Sat - Sunday only 08/27/20   Glendale Chard, MD  losartan-hydrochlorothiazide [facility]) 50-12.5 MG tablet TAKE 1 TABLET BY MOUTH EVERY DAY 11/12/20   Glendale Chard, MD  pravastatin (PRAVACHOL) 80 MG tablet TAKE ONE TAB PO M-F, SKIP SAT/SUN 03/12/20   Glendale Chard, MD    Family History Family History  Problem Relation Age of Onset   Hypertension Mother    Kidney failure Mother    Congestive Heart Failure Mother    Heart Problems Father    Stroke Father    Pneumonia Father    Heart Problems Sister 50       heart transplant   Stroke Paternal Grandmother    Dementia Paternal Grandmother    Stroke Paternal Grandfather    Cardiomyopathy Sister    Ovarian cancer Sister 93       d. 83   Thyroid disease Sister    Thyroid disease Sister    Thyroid cancer Niece 53   Breast cancer Neg Hx     Social History Social History   Tobacco Use   Smoking status: Never    Smokeless tobacco: Never  Vaping Use   Vaping Use: Never used  Substance Use Topics   Alcohol use: No   Drug use: No     Allergies   Cyclobenzaprine   Review of Systems Review of Systems  As stated above in HPI Physical Exam Triage Vital Signs ED Triage Vitals [02/26/21 1421]  Enc Vitals Group     BP (!) 144/70     Pulse Rate 88     Resp 16     Temp 97.7 F (36.5 C)     Temp Source Oral     SpO2 99 %  Weight      Height      Head Circumference      Peak Flow      Pain Score 8     Pain Loc      Pain Edu?      Excl. in [place]?    No data found.  Updated Vital Signs BP (!) 144/70 (BP Location: Left Arm)   Pulse 88   Temp 97.7 F (36.5 C) (Oral)   Resp 16   SpO2 99%   Physical Exam Vitals and nursing note reviewed.  Constitutional:      General: She is not in acute distress.    Appearance: Normal appearance. She is not ill-appearing, toxic-appearing or diaphoretic.  HENT:     Head: Normocephalic and atraumatic.     Right Ear: Tympanic membrane normal.     Left Ear: Tympanic membrane normal.     Nose: Nose normal.     Mouth/Throat:     Mouth: Mucous membranes are moist.     Pharynx: Oropharynx is clear. No oropharyngeal exudate or posterior oropharyngeal erythema.     Comments: NO evidence of facial swelling  Eyes:     Extraocular Movements: Extraocular movements intact.     Pupils: Pupils are equal, round, and reactive to light.  Cardiovascular:     Rate and Rhythm: Normal rate and regular rhythm.     Heart sounds: Normal heart sounds.  Pulmonary:     Effort: Pulmonary effort is normal.     Breath sounds: Normal breath sounds.  Musculoskeletal:     Cervical back: Normal range of motion and neck supple.  Lymphadenopathy:     Cervical: No cervical adenopathy.  Skin:    General: Skin is warm.     Comments: See below  Neurological:     Mental Status: She is alert and oriented to person, place, and time.          UC Treatments / Results   Labs (all labs ordered are listed, but only abnormal results are displayed) Labs Reviewed  CBG MONITORING, ED    EKG   Radiology DG Chest 2 View  Result Date: 02/26/2021 CLINICAL DATA:  Cough for 3 weeks EXAM: CHEST - 2 VIEW COMPARISON:  09/11/2016 FINDINGS: The heart size and mediastinal contours are within normal limits. Both lungs are clear. Prominent calcification in left subacromial space either representing loose body or calcific tendinopathy IMPRESSION: No active cardiopulmonary disease. Electronically Signed   By: Donavan Foil M.D.   On: 02/26/2021 16:03   DG Foot 2 Views Right  Result Date: 02/26/2021 CLINICAL DATA:  Stepped on glass EXAM: RIGHT FOOT - 2 VIEW COMPARISON:  None. FINDINGS: No fracture or malalignment. No radiopaque foreign body in the soft tissues. Degenerative changes at the first MTP joint. IMPRESSION: Negative. Electronically Signed   By: Donavan Foil M.D.   On: 02/26/2021 16:03    Procedures Procedures (including critical care time)  Medications Ordered in UC Medications - No data to display  Initial Impression / Assessment and Plan / UC Course  I have reviewed the triage vital signs and the nursing notes.  Pertinent labs & imaging results that were available during my care of the patient were reviewed by me and considered in my medical decision making (see chart for details).     New. Obtaining a chest x-ray per patient request given that she has had a cough for 3 weeks.  We are also going to obtain an image of the  right foot to ensure no sign of foreign body glass.  In terms of her lip swelling I do not see any swelling on exam today-she has follow-up with in the next few days with her PCP and I have recommended that she discuss this further with them as she likely needs a referral to either a ENT/dermatologist for further work-up or potentially have her medication adjusted.  At this time this does not look like angioedema however we discussed red flag  signs and symptoms.  CBG pending given her status of uncontrolled DM2.  UPDATE: Chest x ray is clear. Treating with tessalon while she await for follow up with PCP. Foot x ray shows no evidence of foreign body. This appears to be a VV from skin breakdown. Discussed and recommended OTC therapy.  Final Clinical Impressions(s) / UC Diagnoses   Final diagnoses:  None   Discharge Instructions   None    ED Prescriptions   None    PDMP not reviewed this encounter.   Hughie Closs, [place] 02/26/21 [phone]

## 2021-02-26 NOTE — ED Triage Notes (Signed)
Pt presents with c/o cough x 3 weeks. States the top of her lip has been swollen and sensitive. States she stepped on glass 2 weeks ago and is concerned the glass is still in there after attempting to remove it. Pt states she has ankle swelling and states she wants her A1C checked.

## 2021-03-05 ENCOUNTER — Other Ambulatory Visit: Payer: Self-pay

## 2021-03-05 MED ORDER — DULOXETINE HCL 60 MG PO CPEP: 60.0000 mg | ORAL_CAPSULE | Freq: Every day | ORAL | 1 refills | Status: DC

## 2021-03-12 ENCOUNTER — Other Ambulatory Visit: Payer: Self-pay

## 2021-03-12 ENCOUNTER — Encounter: Payer: Self-pay | Admitting: Internal Medicine

## 2021-03-12 ENCOUNTER — Ambulatory Visit: Payer: Medicare PPO | Admitting: Internal Medicine

## 2021-03-12 ENCOUNTER — Ambulatory Visit (INDEPENDENT_AMBULATORY_CARE_PROVIDER_SITE_OTHER): Payer: Medicare PPO

## 2021-03-12 VITALS — BP 120/74 | HR 87 | Temp 98.6°F | Ht 63.4 in | Wt 177.0 lb

## 2021-03-12 DIAGNOSIS — E1165 Type 2 diabetes mellitus with hyperglycemia: Secondary | ICD-10-CM | POA: Diagnosis not present

## 2021-03-12 DIAGNOSIS — Z0000 Encounter for general adult medical examination without abnormal findings: Secondary | ICD-10-CM | POA: Diagnosis not present

## 2021-03-12 DIAGNOSIS — I10 Essential (primary) hypertension: Secondary | ICD-10-CM

## 2021-03-12 DIAGNOSIS — S20469A Insect bite (nonvenomous) of unspecified back wall of thorax, initial encounter: Secondary | ICD-10-CM

## 2021-03-12 DIAGNOSIS — E890 Postprocedural hypothyroidism: Secondary | ICD-10-CM | POA: Diagnosis not present

## 2021-03-12 DIAGNOSIS — Z23 Encounter for immunization: Secondary | ICD-10-CM

## 2021-03-12 LAB — POCT URINALYSIS DIPSTICK
Bilirubin, UA: NEGATIVE
Blood, UA: NEGATIVE
Glucose, UA: NEGATIVE
Ketones, UA: NEGATIVE
Leukocytes, UA: NEGATIVE
Nitrite, UA: NEGATIVE
Protein, UA: NEGATIVE
Spec Grav, UA: 1.015 (ref 1.010–1.025)
Urobilinogen, UA: 0.2 E.U./dL
pH, UA: 6.5 (ref 5.0–8.0)

## 2021-03-12 LAB — POCT UA - MICROALBUMIN
Albumin/Creatinine Ratio, Urine, POC: <30
Creatinine, POC: 300 mg/dL
Microalbumin Ur, POC: 10 mg/L

## 2021-03-12 MED ORDER — SYNTHROID 50 MCG PO TABS: ORAL_TABLET | ORAL | 11 refills | Status: DC

## 2021-03-12 MED ORDER — SHINGRIX 50 MCG/0.5ML IM SUSR: 0.5000 mL | Freq: Once | INTRAMUSCULAR | 0 refills | Status: AC

## 2021-03-12 NOTE — Patient Instructions (Signed)
Christina Hernandez , Thank you for taking time to come for your Medicare Wellness Visit. I appreciate your ongoing commitment to your health goals. Please review the following plan we discussed and let me know if I can assist you in the future.   Screening recommendations/referrals: Colonoscopy: not required Mammogram: completed 08/22/2020 Bone Density: completed 07/29/2014 Recommended yearly ophthalmology/optometry visit for glaucoma screening and checkup Recommended yearly dental visit for hygiene and checkup  Vaccinations: Influenza vaccine: today Pneumococcal vaccine: completed 03/17/2020 Tdap vaccine: completed 05/28/2011, due 05/27/2021 Shingles vaccine: discussed   Covid-19:10/24/2020, 03/31/2020, 09/03/2019, 08/06/2019  Advanced directives: Advance directive discussed with you today. Even though you declined this today please call our office should you change your mind and we can give you the proper paperwork for you to fill out.  Conditions/risks identified: none  Next appointment: Follow up in one year for your annual wellness visit    Preventive Care 65 Years and Older, Female Preventive care refers to lifestyle choices and visits with your health care provider that can promote health and wellness. What does preventive care include? A yearly physical exam. This is also called an annual well check. Dental exams once or twice a year. Routine eye exams. Ask your health care provider how often you should have your eyes checked. Personal lifestyle choices, including: Daily care of your teeth and gums. Regular physical activity. Eating a healthy diet. Avoiding tobacco and drug use. Limiting alcohol use. Practicing safe sex. Taking low-dose aspirin every day. Taking vitamin and mineral supplements as recommended by your health care provider. What happens during an annual well check? The services and screenings done by your health care provider during your annual well check will depend on  your age, overall health, lifestyle risk factors, and family history of disease. Counseling  Your health care provider may ask you questions about your: Alcohol use. Tobacco use. Drug use. Emotional well-being. Home and relationship well-being. Sexual activity. Eating habits. History of falls. Memory and ability to understand (cognition). Work and work Statistician. Reproductive health. Screening  You may have the following tests or measurements: Height, weight, and BMI. Blood pressure. Lipid and cholesterol levels. These may be checked every 5 years, or more frequently if you are over 89 years old. Skin check. Lung cancer screening. You may have this screening every year starting at age 19 if you have a 30-pack-year history of smoking and currently smoke or have quit within the past 15 years. Fecal occult blood test (FOBT) of the stool. You may have this test every year starting at age 50. Flexible sigmoidoscopy or colonoscopy. You may have a sigmoidoscopy every 5 years or a colonoscopy every 10 years starting at age 64. Hepatitis C blood test. Hepatitis B blood test. Sexually transmitted disease (STD) testing. Diabetes screening. This is done by checking your blood sugar (glucose) after you have not eaten for a while (fasting). You may have this done every 1-3 years. Bone density scan. This is done to screen for osteoporosis. You may have this done starting at age 27. Mammogram. This may be done every 1-2 years. Talk to your health care provider about how often you should have regular mammograms. Talk with your health care provider about your test results, treatment options, and if necessary, the need for more tests. Vaccines  Your health care provider may recommend certain vaccines, such as: Influenza vaccine. This is recommended every year. Tetanus, diphtheria, and acellular pertussis (Tdap, Td) vaccine. You may need a Td booster every 10 years. Zoster  vaccine. You may need this  after age 1. Pneumococcal 13-valent conjugate (PCV13) vaccine. One dose is recommended after age 16. Pneumococcal polysaccharide (PPSV23) vaccine. One dose is recommended after age 41. Talk to your health care provider about which screenings and vaccines you need and how often you need them. This information is not intended to replace advice given to you by your health care provider. Make sure you discuss any questions you have with your health care provider. Document Released: 07/11/2015 Document Revised: 03/03/2016 Document Reviewed: 04/15/2015 Elsevier Interactive Patient Education  2017 [place] Prevention in the Home Falls can cause injuries. They can happen to people of all ages. There are many things you can do to make your home safe and to help prevent falls. What can I do on the outside of my home? Regularly fix the edges of walkways and driveways and fix any cracks. Remove anything that might make you trip as you walk through a door, such as a raised step or threshold. Trim any bushes or trees on the path to your home. Use bright outdoor lighting. Clear any walking paths of anything that might make someone trip, such as rocks or tools. Regularly check to see if handrails are loose or broken. Make sure that both sides of any steps have handrails. Any raised decks and porches should have guardrails on the edges. Have any leaves, snow, or ice cleared regularly. Use sand or salt on walking paths during winter. Clean up any spills in your garage right away. This includes oil or grease spills. What can I do in the bathroom? Use night lights. Install grab bars by the toilet and in the tub and shower. Do not use towel bars as grab bars. Use non-skid mats or decals in the tub or shower. If you need to sit down in the shower, use a plastic, non-slip stool. Keep the floor dry. Clean up any water that spills on the floor as soon as it happens. Remove soap buildup in the tub or  shower regularly. Attach bath mats securely with double-sided non-slip rug tape. Do not have throw rugs and other things on the floor that can make you trip. What can I do in the bedroom? Use night lights. Make sure that you have a light by your bed that is easy to reach. Do not use any sheets or blankets that are too big for your bed. They should not hang down onto the floor. Have a firm chair that has side arms. You can use this for support while you get dressed. Do not have throw rugs and other things on the floor that can make you trip. What can I do in the kitchen? Clean up any spills right away. Avoid walking on wet floors. Keep items that you use a lot in easy-to-reach places. If you need to reach something above you, use a strong step stool that has a grab bar. Keep electrical cords out of the way. Do not use floor polish or wax that makes floors slippery. If you must use wax, use non-skid floor wax. Do not have throw rugs and other things on the floor that can make you trip. What can I do with my stairs? Do not leave any items on the stairs. Make sure that there are handrails on both sides of the stairs and use them. Fix handrails that are broken or loose. Make sure that handrails are as long as the stairways. Check any carpeting to make sure that it  is firmly attached to the stairs. Fix any carpet that is loose or worn. Avoid having throw rugs at the top or bottom of the stairs. If you do have throw rugs, attach them to the floor with carpet tape. Make sure that you have a light switch at the top of the stairs and the bottom of the stairs. If you do not have them, ask someone to add them for you. What else can I do to help prevent falls? Wear shoes that: Do not have high heels. Have rubber bottoms. Are comfortable and fit you well. Are closed at the toe. Do not wear sandals. If you use a stepladder: Make sure that it is fully opened. Do not climb a closed stepladder. Make  sure that both sides of the stepladder are locked into place. Ask someone to hold it for you, if possible. Clearly mark and make sure that you can see: Any grab bars or handrails. First and last steps. Where the edge of each step is. Use tools that help you move around (mobility aids) if they are needed. These include: Canes. Walkers. Scooters. Crutches. Turn on the lights when you go into a dark area. Replace any light bulbs as soon as they burn out. Set up your furniture so you have a clear path. Avoid moving your furniture around. If any of your floors are uneven, fix them. If there are any pets around you, be aware of where they are. Review your medicines with your doctor. Some medicines can make you feel dizzy. This can increase your chance of falling. Ask your doctor what other things that you can do to help prevent falls. This information is not intended to replace advice given to you by your health care provider. Make sure you discuss any questions you have with your health care provider. Document Released: 04/10/2009 Document Revised: 11/20/2015 Document Reviewed: 07/19/2014 Elsevier Interactive Patient Education  2017 [employer].

## 2021-03-12 NOTE — Patient Instructions (Signed)
Diabetes Mellitus and Nutrition, Adult When you have diabetes, or diabetes mellitus, it is very important to have healthy eating habits because your blood sugar (glucose) levels are greatly affected by what you eat and drink. Eating healthy foods in the right amounts, at about the same times every day, can help you:  Control your blood glucose.  Lower your risk of heart disease.  Improve your blood pressure.  Reach or maintain a healthy weight. What can affect my meal plan? Every person with diabetes is different, and each person has different needs for a meal plan. Your health care provider may recommend that you work with a dietitian to make a meal plan that is best for you. Your meal plan may vary depending on factors such as:  The calories you need.  The medicines you take.  Your weight.  Your blood glucose, blood pressure, and cholesterol levels.  Your activity level.  Other health conditions you have, such as heart or kidney disease. How do carbohydrates affect me? Carbohydrates, also called carbs, affect your blood glucose level more than any other type of food. Eating carbs naturally raises the amount of glucose in your blood. Carb counting is a method for keeping track of how many carbs you eat. Counting carbs is important to keep your blood glucose at a healthy level, especially if you use insulin or take certain oral diabetes medicines. It is important to know how many carbs you can safely have in each meal. This is different for every person. Your dietitian can help you calculate how many carbs you should have at each meal and for each snack. How does alcohol affect me? Alcohol can cause a sudden decrease in blood glucose (hypoglycemia), especially if you use insulin or take certain oral diabetes medicines. Hypoglycemia can be a life-threatening condition. Symptoms of hypoglycemia, such as sleepiness, dizziness, and confusion, are similar to symptoms of having too much  alcohol.  Do not drink alcohol if: ? Your health care provider tells you not to drink. ? You are pregnant, may be pregnant, or are planning to become pregnant.  If you drink alcohol: ? Do not drink on an empty stomach. ? Limit how much you use to:  0-1 drink a day for women.  0-2 drinks a day for men. ? Be aware of how much alcohol is in your drink. In the [place] one drink equals one 12 oz bottle of beer (355 mL), one 5 oz glass of wine (148 mL), or one 1 oz glass of hard liquor (44 mL). ? Keep yourself hydrated with water, diet soda, or unsweetened iced tea.  Keep in mind that regular soda, juice, and other mixers may contain a lot of sugar and must be counted as carbs. What are tips for following this plan? Reading food labels  Start by checking the serving size on the "Nutrition Facts" label of packaged foods and drinks. The amount of calories, carbs, fats, and other nutrients listed on the label is based on one serving of the item. Many items contain more than one serving per package.  Check the total grams (g) of carbs in one serving. You can calculate the number of servings of carbs in one serving by dividing the total carbs by 15. For example, if a food has 30 g of total carbs per serving, it would be equal to 2 servings of carbs.  Check the number of grams (g) of saturated fats and trans fats in one serving. Choose foods that have   a low amount or none of these fats.  Check the number of milligrams (mg) of salt (sodium) in one serving. Most people should limit total sodium intake to less than 2,300 mg per day.  Always check the nutrition information of foods labeled as "low-fat" or "nonfat." These foods may be higher in added sugar or refined carbs and should be avoided.  Talk to your dietitian to identify your daily goals for nutrients listed on the label. Shopping  Avoid buying canned, pre-made, or processed foods. These foods tend to be high in fat, sodium, and added  sugar.  Shop around the outside edge of the grocery store. This is where you will most often find fresh fruits and vegetables, bulk grains, fresh meats, and fresh dairy. Cooking  Use low-heat cooking methods, such as baking, instead of high-heat cooking methods like deep frying.  Cook using healthy oils, such as olive, canola, or sunflower oil.  Avoid cooking with butter, cream, or high-fat meats. Meal planning  Eat meals and snacks regularly, preferably at the same times every day. Avoid going long periods of time without eating.  Eat foods that are high in fiber, such as fresh fruits, vegetables, beans, and whole grains. Talk with your dietitian about how many servings of carbs you can eat at each meal.  Eat 4-6 oz (112-168 g) of lean protein each day, such as lean meat, chicken, fish, eggs, or tofu. One ounce (oz) of lean protein is equal to: ? 1 oz (28 g) of meat, chicken, or fish. ? 1 egg. ?  cup (62 g) of tofu.  Eat some foods each day that contain healthy fats, such as avocado, nuts, seeds, and fish.   What foods should I eat? Fruits Berries. Apples. Oranges. Peaches. Apricots. Plums. Grapes. Mango. Papaya. Pomegranate. Kiwi. Cherries. Vegetables Lettuce. Spinach. Leafy greens, including kale, chard, collard greens, and mustard greens. Beets. Cauliflower. Cabbage. Broccoli. Carrots. Green beans. Tomatoes. Peppers. Onions. Cucumbers. Brussels sprouts. Grains Whole grains, such as whole-wheat or whole-grain bread, crackers, tortillas, cereal, and pasta. Unsweetened oatmeal. Quinoa. Brown or wild rice. Meats and other proteins Seafood. Poultry without skin. Lean cuts of poultry and beef. Tofu. Nuts. Seeds. Dairy Low-fat or fat-free dairy products such as milk, yogurt, and cheese. The items listed above may not be a complete list of foods and beverages you can eat. Contact a dietitian for more information. What foods should I avoid? Fruits Fruits canned with  syrup. Vegetables Canned vegetables. Frozen vegetables with butter or cream sauce. Grains Refined white flour and flour products such as bread, pasta, snack foods, and cereals. Avoid all processed foods. Meats and other proteins Fatty cuts of meat. Poultry with skin. Breaded or fried meats. Processed meat. Avoid saturated fats. Dairy Full-fat yogurt, cheese, or milk. Beverages Sweetened drinks, such as soda or iced tea. The items listed above may not be a complete list of foods and beverages you should avoid. Contact a dietitian for more information. Questions to ask a health care provider  Do I need to meet with a diabetes educator?  Do I need to meet with a dietitian?  What number can I call if I have questions?  When are the best times to check my blood glucose? Where to find more information:  American Diabetes Association: diabetes.org  Academy of Nutrition and Dietetics: www.eatright.org  National Institute of Diabetes and Digestive and Kidney Diseases: www.niddk.nih.gov  Association of Diabetes Care and Education Specialists: www.diabeteseducator.org Summary  It is important to have healthy eating   habits because your blood sugar (glucose) levels are greatly affected by what you eat and drink.  A healthy meal plan will help you control your blood glucose and maintain a healthy lifestyle.  Your health care provider may recommend that you work with a dietitian to make a meal plan that is best for you.  Keep in mind that carbohydrates (carbs) and alcohol have immediate effects on your blood glucose levels. It is important to count carbs and to use alcohol carefully. This information is not intended to replace advice given to you by your health care provider. Make sure you discuss any questions you have with your health care provider. Document Revised: 05/22/2019 Document Reviewed: 05/22/2019 Elsevier Patient Education  2021 Elsevier Inc.  

## 2021-03-12 NOTE — Progress Notes (Signed)
I,YAMILKA J Llittleton,acting as a Education administrator for [name], MD.,have documented all relevant documentation on the behalf of [name], MD,as directed by  [name], MD while in the presence of [name], MD.  This visit occurred during the SARS-CoV-2 public health emergency.  Safety protocols were in place, including screening questions prior to the visit, additional usage of staff PPE, and extensive cleaning of exam room while observing appropriate contact time as indicated for disinfecting solutions.  Subjective:     Patient ID: Christina Hernandez , female    DOB: [birth year] , 70 y.o.   MRN: [id number]   Chief Complaint  Patient presents with  . Hypertension  . Diabetes    HPI  Patient presents today for a bp and diabetes f/u. She reports compliance with meds. She denies headaches, chest pain and shortness of breath.   She is also scheduled for AWV with [facility] Advisor.   Hypertension This is a chronic problem. The current episode started more than 1 year ago. The problem has been gradually improving since onset. The problem is controlled. Pertinent negatives include no blurred vision, chest pain, palpitations or shortness of breath. Risk factors for coronary artery disease include diabetes mellitus, dyslipidemia, post-menopausal state and sedentary lifestyle. The current treatment provides moderate improvement. Compliance problems include exercise.   Diabetes She presents for her follow-up diabetic visit. She has type 2 diabetes mellitus. There are no hypoglycemic associated symptoms. There are no diabetic associated symptoms. Pertinent negatives for diabetes include no blurred vision and no chest pain. There are no hypoglycemic complications. Risk factors for coronary artery disease include diabetes mellitus, dyslipidemia, hypertension, sedentary lifestyle and post-menopausal. She participates in exercise intermittently. An ACE inhibitor/angiotensin II receptor  blocker is being taken. Eye exam is current.    Past Medical History:  Diagnosis Date  . Arthritis   . Breast cancer, right breast [facility])    pending right breast lumpectomy on September 01, 2018  . Diabetes mellitus type II, controlled ([place])    On Kombiglyze  . Family history of ovarian cancer   . Family history of thyroid cancer   . GERD (gastroesophageal reflux disease)    rarely occurs- no meds  . Hyperlipidemia associated with type 2 diabetes mellitus ([place])   . Hypertension   . Hypothyroidism   . Personal history of radiation therapy   . Primary localized osteoarthritis of right knee 04/17/2019  . S/P knee replacement 04/17/2019  . Trigeminal neuralgia of left side of face 2015   s/p Gamma knife     Family History  Problem Relation Age of Onset  . Hypertension Mother   . Kidney failure Mother   . Congestive Heart Failure Mother   . Heart Problems Father   . Stroke Father   . Pneumonia Father   . Heart Problems Sister 40       heart transplant  . Stroke Paternal Grandmother   . Dementia Paternal Grandmother   . Stroke Paternal Grandfather   . Cardiomyopathy Sister   . Ovarian cancer Sister 24       d. 45  . Thyroid disease Sister   . Thyroid disease Sister   . Thyroid cancer Niece 31  . Breast cancer Neg Hx      Current Outpatient Medications:  .  SYNTHROID 50 MCG tablet, Take one daily M-F, Disp: 30 tablet, Rfl: 11 .  Zoster Vaccine Adjuvanted (SHINGRIX) injection, Inject 0.5 mLs into the muscle once for 1 dose., Disp:  0.5 mL, Rfl: 0 .  Accu-Chek FastClix Lancets MISC, Use as instructed to check blood sugars 1 time per day dx: e11.22, Disp: 150 each, Rfl: 2 .  anastrozole (ARIMIDEX) 1 MG tablet, Take 1 tablet (1 mg total) by mouth daily. Start Apr 29, 2019, Disp: 90 tablet, Rfl: 4 .  baclofen (LIORESAL) 10 MG tablet, TAKE 1 TABLET BY MOUTH THREE TIMES A DAY AS NEEDED FOR MUSCLE SPASMS, Disp: 270 tablet, Rfl: 0 .  benzonatate (TESSALON) 100 MG capsule, Take 1 capsule  (100 mg total) by mouth every 8 (eight) hours., Disp: 21 capsule, Rfl: 0 .  Cholecalciferol (VITAMIN D) 125 MCG (5000 UT) CAPS, Take 5,000 Units by mouth daily., Disp: , Rfl:  .  COMBIGAN 0.2-0.5 % ophthalmic solution, Place 1 drop into both eyes 2 (two) times daily., Disp: , Rfl:  .  DULoxetine (CYMBALTA) 60 MG capsule, Take 1 capsule (60 mg total) by mouth daily., Disp: 90 capsule, Rfl: 1 .  gabapentin (NEURONTIN) 300 MG capsule, Take 3 capsules (900 mg total) by mouth 3 (three) times daily., Disp: 810 capsule, Rfl: 3 .  KOMBIGLYZE XR 5-500 MG TB24, TAKE 1 TABLET BY MOUTH DAILY BEFORE SUPPER., Disp: 90 tablet, Rfl: 1 .  latanoprost (XALATAN) 0.005 % ophthalmic solution, Place 1 drop into both eyes at bedtime., Disp: , Rfl:  .  levothyroxine (SYNTHROID) 75 MCG tablet, Take 1 tablet by mouth on Sat - Sunday only, Disp: 90 tablet, Rfl: 1 .  losartan-hydrochlorothiazide (HYZAAR) 50-12.5 MG tablet, TAKE 1 TABLET BY MOUTH EVERY DAY, Disp: 90 tablet, Rfl: 1 .  pravastatin (PRAVACHOL) 80 MG tablet, TAKE ONE TAB PO M-F, SKIP SAT/SUN, Disp: 75 tablet, Rfl: 2   Allergies  Allergen Reactions  . Cyclobenzaprine Nausea Only     Review of Systems  Constitutional: Negative.   Eyes:  Negative for blurred vision.  Respiratory: Negative.  Negative for shortness of breath.   Cardiovascular: Negative.  Negative for chest pain and palpitations.  Skin:        Complains of bump - thinks it is insect bite.   Neurological: Negative.   Psychiatric/Behavioral: Negative.      Today's Vitals   03/12/21 1059  BP: 120/74  Pulse: 87  Temp: 98.6 F (37 C)  Weight: 177 lb 0.5 oz (80.3 kg)  Height: 5' 3.4" (1.61 m)   Body mass index is 30.96 kg/m.   Objective:  Physical Exam Vitals and nursing note reviewed.  Constitutional:      Appearance: Normal appearance.  HENT:     Head: Normocephalic and atraumatic.     Nose:     Comments: Masked     Mouth/Throat:     Comments: Masked  Eyes:     Extraocular  Movements: Extraocular movements intact.  Cardiovascular:     Rate and Rhythm: Normal rate and regular rhythm.     Heart sounds: Normal heart sounds.  Pulmonary:     Effort: Pulmonary effort is normal.     Breath sounds: Normal breath sounds.  Musculoskeletal:     Cervical back: Normal range of motion.  Skin:    General: Skin is warm.     Comments: Erythematous papular lesion located on left upper back. No drainage. No vesicular lesions noted.   Neurological:     General: No focal deficit present.     Mental Status: She is alert.  Psychiatric:        Mood and Affect: Mood normal.  Behavior: Behavior normal.        Assessment And Plan:     1. Essential hypertension Comments: Chronic, well controlled. She is encouraged to follow low sodium diet.  She will f/u in 4 months for re-evaluation.   2. Uncontrolled type 2 diabetes mellitus with hyperglycemia ([place]) Comments: Chronic, last a1c 6.30 December 2020. I wil defer further labs until Dec 2022 for her physical exam.  3. Postoperative hypothyroidism Comments: She is currently taking Synthroid 48mg M-F and 735m on Sat/Sun. I will check thyroid panel and adjust meds as needed.  - TSH  4. Insect bite of back, unspecified laterality, initial encounter Comments: No signs of infection. Advised to apply neosporin to affected area bid and to avoid scratching it. Thinks it occurred 03/09/21. Not sure what insect bit her.   5. Immunization due Comments: I will send rx Shingrix to her local pharmacy.    Patient was given opportunity to ask questions. Patient verbalized understanding of the plan and was able to repeat key elements of the plan. All questions were answered to their satisfaction.   I, RoMaximino GreenlandMD, have reviewed all documentation for this visit. The documentation on 03/12/21 for the exam, diagnosis, procedures, and orders are all accurate and complete.   IF YOU HAVE BEEN REFERRED TO A SPECIALIST, IT MAY TAKE 1-2  WEEKS TO SCHEDULE/PROCESS THE REFERRAL. IF YOU HAVE NOT HEARD FROM US/SPECIALIST IN TWO WEEKS, PLEASE GIVE [place] CALL AT [phone] X [phone].   THE PATIENT IS ENCOURAGED TO PRACTICE SOCIAL DISTANCING DUE TO THE COVID-19 PANDEMIC.

## 2021-03-12 NOTE — Addendum Note (Signed)
Addended by: Glenna Durand E on: 03/12/2021 12:43 PM   Modules accepted: Orders

## 2021-03-12 NOTE — Progress Notes (Signed)
This visit occurred during the SARS-CoV-2 public health emergency.  Safety protocols were in place, including screening questions prior to the visit, additional usage of staff PPE, and extensive cleaning of exam room while observing appropriate contact time as indicated for disinfecting solutions.  Subjective:   Christina Hernandez is a 70 y.o. female who presents for Medicare Annual (Subsequent) preventive examination.  Review of Systems     Cardiac Risk Factors include: advanced age (>29mn, >>35women);diabetes mellitus;hypertension;obesity (BMI >30kg/m2);sedentary lifestyle     Objective:    Today's Vitals   03/12/21 1045 03/12/21 1053  BP: 120/74   Pulse: 87   Temp: 98.6 F (37 C)   TempSrc: Oral   SpO2: 95%   Weight: 177 lb (80.3 kg)   Height: 5' 3.4" (1.61 m)   PainSc:  3    Body mass index is 30.96 kg/m.  Advanced Directives 03/12/2021 07/11/2020 03/12/2020 04/17/2019 04/10/2019 03/08/2019 08/28/2018  Does Patient Have a Medical Advance Directive? No No No No No No No  Would patient like information on creating a medical advance directive? No - Patient declined - Yes (MAU/Ambulatory/Procedural Areas - Information given) No - Patient declined No - Patient declined - No - Patient declined    Current Medications (verified) Outpatient Encounter Medications as of 03/12/2021  Medication Sig   Accu-Chek FastClix Lancets MISC Use as instructed to check blood sugars 1 time per day dx: e11.22   anastrozole (ARIMIDEX) 1 MG tablet Take 1 tablet (1 mg total) by mouth daily. Start Apr 29, 2019   baclofen (LIORESAL) 10 MG tablet TAKE 1 TABLET BY MOUTH THREE TIMES A DAY AS NEEDED FOR MUSCLE SPASMS   benzonatate (TESSALON) 100 MG capsule Take 1 capsule (100 mg total) by mouth every 8 (eight) hours.   Cholecalciferol (VITAMIN D) 125 MCG (5000 UT) CAPS Take 5,000 Units by mouth daily.   COMBIGAN 0.2-0.5 % ophthalmic solution Place 1 drop into both eyes 2 (two) times daily.   DULoxetine  (CYMBALTA) 60 MG capsule Take 1 capsule (60 mg total) by mouth daily.   gabapentin (NEURONTIN) 300 MG capsule Take 3 capsules (900 mg total) by mouth 3 (three) times daily.   KOMBIGLYZE XR 5-500 MG TB24 TAKE 1 TABLET BY MOUTH DAILY BEFORE SUPPER.   latanoprost (XALATAN) 0.005 % ophthalmic solution Place 1 drop into both eyes at bedtime.   levothyroxine (SYNTHROID) 75 MCG tablet Take 1 tablet by mouth on Sat - Sunday only   losartan-hydrochlorothiazide (HYZAAR) 50-12.5 MG tablet TAKE 1 TABLET BY MOUTH EVERY DAY   pravastatin (PRAVACHOL) 80 MG tablet TAKE ONE TAB PO M-F, SKIP SAT/SUN   No facility-administered encounter medications on file as of 03/12/2021.    Allergies (verified) Cyclobenzaprine   History: Past Medical History:  Diagnosis Date   Arthritis    Breast cancer, right breast ([place]    pending right breast lumpectomy on September 01, 2018   Diabetes mellitus type II, controlled ([facility] [place]    On Kombiglyze   Family history of ovarian cancer    Family history of thyroid cancer    GERD (gastroesophageal reflux disease)    rarely occurs- no meds   Hyperlipidemia associated with type 2 diabetes mellitus ([place]    Hypertension    Hypothyroidism    Personal history of radiation therapy    Primary localized osteoarthritis of right knee 04/17/2019   S/P knee replacement 04/17/2019   Trigeminal neuralgia of left side of face 2015   s/p Gamma knife   Past Surgical  History:  Procedure Laterality Date   BREAST BIOPSY Right 07/24/2018   BREAST LUMPECTOMY Right 09/01/2018   BREAST LUMPECTOMY WITH RADIOACTIVE SEED LOCALIZATION Right 09/01/2018   Procedure: RIGHT BREAST LUMPECTOMY WITH RADIOACTIVE SEED LOCALIZATION;  Surgeon: [name], MD;  Location: [facility];  Service: General;  Laterality: Right;   COLONOSCOPY     Gamma Knife Left 04/08/2016   HYSTEROSCOPY WITH D & C  05/24/2011   Procedure: DILATATION AND CURETTAGE (D&C) /HYSTEROSCOPY;  Surgeon: [name], MD;   Location: [facility];  Service: Gynecology;  Laterality: N/A;   KNEE SURGERY Right 06/10/15   torn ligament   PARTIAL KNEE ARTHROPLASTY Right 04/17/2019   Procedure: UNICOMPARTMENTAL KNEE;  Surgeon: [name], MD;  Location: [facility];  Service: Orthopedics;  Laterality: Right;   THYROIDECTOMY N/A 09/08/2016   Procedure: TOTAL THYROIDECTOMY;  Surgeon: [name], MD;  Location: [facility];  Service: ENT;  Laterality: N/A;   TUBAL LIGATION     uterine polyp     WISDOM TOOTH EXTRACTION Left 2014   Family History  Problem Relation Age of Onset   Hypertension Mother    Kidney failure Mother    Congestive Heart Failure Mother    Heart Problems Father    Stroke Father    Pneumonia Father    Heart Problems Sister 56       heart transplant   Stroke Paternal Grandmother    Dementia Paternal Grandmother    Stroke Paternal Grandfather    Cardiomyopathy Sister    Ovarian cancer Sister 1       d. 71   Thyroid disease Sister    Thyroid disease Sister    Thyroid cancer Niece 16   Breast cancer Neg Hx    Social History   Socioeconomic History   Marital status: Married    Spouse name: [name]   Number of children: 4   Years of education: BS   Highest education level: Not on file  Occupational History   Occupation: Retired    Fish farm manager: OTHER  Tobacco Use   Smoking status: Never   Smokeless tobacco: Never  Vaping Use   Vaping Use: Never used  Substance and Sexual Activity   Alcohol use: No   Drug use: No   Sexual activity: Not Currently  Other Topics Concern   Not on file  Social History Narrative   Patient lives at home with family.   Caffeine Use: occasionally      Lives at home with husband and son.  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    Difficulty of Paying Living Expenses: Not hard at all  Food Insecurity: No Food Insecurity   Worried About Charity fundraiser in the Last Year: Never true   Arboriculturist in the Last Year:  Never true  Transportation Needs: No Transportation Needs   Lack of Transportation (Medical): No   Lack of Transportation (Non-Medical): No  Physical Activity: Inactive   Days of Exercise per Week: 0 days   Minutes of Exercise per Session: 0 min  Stress: No Stress Concern Present   Feeling of Stress : Not at all  Social Connections: Not on file    Tobacco Counseling Counseling given: Not Answered   Clinical Intake:  Pre-visit preparation completed: Yes  Pain : 0-10 Pain Score: 3  Pain Type: Chronic pain Pain Location: Face Pain Orientation: Left Pain Descriptors / Indicators: Tingling, Burning Pain  Onset: More than a month ago Pain Frequency: Constant     Nutritional Status: BMI > 30  Obese Nutritional Risks: None Diabetes: Yes  How often do you need to have someone help you when you read instructions, pamphlets, or other written materials from your doctor or pharmacy?: 1 - Never What is the last grade level you completed in school?: college  Diabetic? Yes Nutrition Risk Assessment:  Has the patient had any N/V/D within the last 2 months?  No  Does the patient have any non-healing wounds?  No  Has the patient had any unintentional weight loss or weight gain?  No   Diabetes:  Is the patient diabetic?  Yes  If diabetic, was a CBG obtained today?  No  Did the patient bring in their glucometer from home?  No  How often do you monitor your CBG's? Does not.   Financial Strains and Diabetes Management:  Are you having any financial strains with the device, your supplies or your medication? No .  Does the patient want to be seen by Chronic Care Management for management of their diabetes?  No  Would the patient like to be referred to a Nutritionist or for Diabetic Management?  No   Diabetic Exams:  Diabetic Eye Exam: Completed 12/16/2020 Diabetic Foot Exam: Completed 06/11/2020   Interpreter Needed?: No  Information entered by :: NAllen LPN   Activities of  Daily Living In your present state of health, do you have any difficulty performing the following activities: 03/12/2021 03/12/2020  Hearing? N Y  Comment - thinks needs ears flushed  Vision? Y N  Comment sometimes -  Difficulty concentrating or making decisions? N N  Walking or climbing stairs? Y N  Dressing or bathing? N N  Doing errands, shopping? N N  Preparing Food and eating ? N N  Using the Toilet? N N  In the past six months, have you accidently leaked urine? N N  Do you have problems with loss of bowel control? N N  Managing your Medications? N N  Managing your Finances? N N  Housekeeping or managing your Housekeeping? N N  Some recent data might be hidden    Patient Care Team: Glendale Chard, MD as PCP - General (Internal Medicine) [name], MD as PCP - Cardiology (Cardiology) [name], MD as Consulting Physician (Obstetrics and Gynecology) [name], MD as Consulting Physician (General Surgery) Magrinat, [name], MD as Consulting Physician (Oncology) [name], MD as Consulting Physician (Radiation Oncology) Penni Bombard, MD as Consulting Physician (Neurology) [name], MD as Consulting Physician (Pulmonary Disease) [name], MD as Consulting Physician (Orthopedic Surgery) Clydell Hakim, MD (Inactive) as Consulting Physician (Anesthesiology)  Indicate any recent Medical Services you may have received from other than [facility] providers in the past year (date may be approximate).     Assessment:   This is a routine wellness examination for Leyli.  Hearing/Vision screen Vision Screening - Comments:: Regular eye exams,   Dietary issues and exercise activities discussed: Current Exercise Habits: The patient does not participate in regular exercise at present   Goals Addressed             This Visit's Progress    Patient Stated       03/12/2021, wants to get of some medications       Depression Screen PHQ 2/9 Scores  03/12/2021 03/12/2020 10/01/2019 04/09/2019 03/08/2019 02/27/2019 12/12/2018  PHQ - 2 Score 0 0 0 0 0 0 0  Fall Risk Fall Risk  03/12/2021 03/12/2020 10/01/2019 04/09/2019 03/08/2019  Falls in the past year? 1 0 0 0 0  Comment missed a step - - - -  Number falls in past yr: 1 - - - -  Injury with Fall? 0 - - - -  Risk for fall due to : History of fall(s);Medication side effect Medication side effect - - Medication side effect  Follow up Falls evaluation completed;Education provided;Falls prevention discussed Falls evaluation completed;Education provided;Falls prevention discussed - - Falls evaluation completed;Education provided;Falls prevention discussed    FALL RISK PREVENTION PERTAINING TO THE HOME:  Any stairs in or around the home? Yes  If so, are there any without handrails? No  Home free of loose throw rugs in walkways, pet beds, electrical cords, etc? Yes  Adequate lighting in your home to reduce risk of falls? Yes   ASSISTIVE DEVICES UTILIZED TO PREVENT FALLS:  Life alert? No  Use of a cane, walker or w/c? No  Grab bars in the bathroom? Yes  Shower chair or bench in shower? No  Elevated toilet seat or a handicapped toilet? No   TIMED UP AND GO:  Was the test performed? No .    Gait steady and fast without use of assistive device  Cognitive Function:     6CIT Screen 03/12/2021 03/12/2020 03/08/2019 05/10/2018  What Year? 0 points 0 points 0 points 0 points  What month? 0 points 0 points 0 points 0 points  What time? 0 points 0 points 0 points 0 points  Count back from 20 0 points 0 points 0 points 0 points  Months in reverse 0 points 0 points 0 points 0 points  Repeat phrase 2 points 0 points 2 points 0 points  Total Score 2 0 2 0    Immunizations Immunization History  Administered Date(s) Administered   Fluad Quad(high Dose 65+) 02/27/2019, 03/12/2020   Influenza, High Dose Seasonal PF 03/27/2018, 02/27/2019   Influenza,inj,Quad PF,6+ Mos 04/12/2016   Moderna  Sars-Covid-2 Vaccination 08/06/2019, 09/03/2019, 03/31/2020   PFIZER Comirnaty(Gray Top)Covid-19 Tri-Sucrose Vaccine 10/24/2020   Pneumococcal Conjugate-13 03/17/2020   Pneumococcal Polysaccharide-23 05/25/2016   Tdap 05/28/2011    TDAP status: Up to date  Flu Vaccine status: Completed at today's visit  Pneumococcal vaccine status: Up to date  Covid-19 vaccine status: Completed vaccines  Qualifies for Shingles Vaccine? Yes   Zostavax completed No   Shingrix Completed?: No.    Education has been provided regarding the importance of this vaccine. Patient has been advised to call insurance company to determine out of pocket expense if they have not yet received this vaccine. Advised may also receive vaccine at local pharmacy or Health Dept. Verbalized acceptance and understanding.  Screening Tests Health Maintenance  Topic Date Due   Zoster Vaccines- Shingrix (1 of 2) Never done   INFLUENZA VACCINE  01/26/2021   COVID-19 Vaccine (5 - Booster for Moderna series) 02/23/2021   TETANUS/TDAP  05/27/2021   FOOT EXAM  06/11/2021   HEMOGLOBIN A1C  07/15/2021   OPHTHALMOLOGY EXAM  12/16/2021   MAMMOGRAM  08/22/2022   COLONOSCOPY (Pts 45-92yr Insurance coverage will need to be confirmed)  02/08/2027   DEXA SCAN  Completed   Hepatitis C Screening  Completed   PNA vac Low Risk Adult  Completed   HPV VACCINES  Aged Out    Health Maintenance  Health Maintenance Due  Topic Date Due   Zoster Vaccines- Shingrix (1 of 2) Never done   INFLUENZA VACCINE  01/26/2021   COVID-19 Vaccine (5 - Booster for Moderna series) 02/23/2021    Colorectal cancer screening: No longer required.   Mammogram status: Completed 08/22/2020. Repeat every year  Bone Density status: Completed 07/29/2014.   Lung Cancer Screening: (Low Dose CT Chest recommended if Age 80-80 years, 30 pack-year currently smoking OR have quit w/in 15years.) does not qualify.   Lung Cancer Screening Referral: no  Additional  Screening:  Hepatitis C Screening: does qualify; Completed 06/05/2012  Vision Screening: Recommended annual ophthalmology exams for early detection of glaucoma and other disorders of the eye. Is the patient up to date with their annual eye exam?  Yes  Who is the provider or what is the name of the office in which the patient attends annual eye exams? Dr. Venetia Maxon If pt is not established with a provider, would they like to be referred to a provider to establish care? No .   Dental Screening: Recommended annual dental exams for proper oral hygiene  Community Resource Referral / Chronic Care Management: CRR required this visit?  No   CCM required this visit?  No      Plan:     I have personally reviewed and noted the following in the patient's chart:   Medical and social history Use of alcohol, tobacco or illicit drugs  Current medications and supplements including opioid prescriptions.  Functional ability and status Nutritional status Physical activity Advanced directives List of other physicians Hospitalizations, surgeries, and ER visits in previous 12 months Vitals Screenings to include cognitive, depression, and falls Referrals and appointments  In addition, I have reviewed and discussed with patient certain preventive protocols, quality metrics, and best practice recommendations. A written personalized care plan for preventive services as well as general preventive health recommendations were provided to patient.     Kellie Simmering, LPN   [id number]   Nurse Notes:

## 2021-03-13 LAB — TSH: TSH: 2.05 u[IU]/mL (ref 0.450–4.500)

## 2021-03-15 MED ORDER — SHINGRIX 50 MCG/0.5ML IM SUSR: 0.5000 mL | Freq: Once | INTRAMUSCULAR | 0 refills | Status: AC

## 2021-03-17 ENCOUNTER — Ambulatory Visit: Payer: Medicare PPO

## 2021-03-19 ENCOUNTER — Other Ambulatory Visit: Payer: Self-pay

## 2021-03-19 DIAGNOSIS — Z111 Encounter for screening for respiratory tuberculosis: Secondary | ICD-10-CM

## 2021-03-23 ENCOUNTER — Other Ambulatory Visit: Payer: Medicare PPO

## 2021-03-23 ENCOUNTER — Other Ambulatory Visit: Payer: Self-pay

## 2021-03-23 DIAGNOSIS — Z111 Encounter for screening for respiratory tuberculosis: Secondary | ICD-10-CM | POA: Diagnosis not present

## 2021-03-24 DIAGNOSIS — H2513 Age-related nuclear cataract, bilateral: Secondary | ICD-10-CM | POA: Diagnosis not present

## 2021-03-24 DIAGNOSIS — E119 Type 2 diabetes mellitus without complications: Secondary | ICD-10-CM | POA: Diagnosis not present

## 2021-03-24 DIAGNOSIS — H401133 Primary open-angle glaucoma, bilateral, severe stage: Secondary | ICD-10-CM | POA: Diagnosis not present

## 2021-03-24 DIAGNOSIS — H02403 Unspecified ptosis of bilateral eyelids: Secondary | ICD-10-CM | POA: Diagnosis not present

## 2021-03-24 DIAGNOSIS — G500 Trigeminal neuralgia: Secondary | ICD-10-CM | POA: Diagnosis not present

## 2021-03-26 LAB — QUANTIFERON-TB GOLD PLUS
QuantiFERON Mitogen Value: >10 IU/mL
QuantiFERON Nil Value: 0.05 IU/mL
QuantiFERON TB1 Ag Value: 0.08 IU/mL
QuantiFERON TB2 Ag Value: 0.09 IU/mL
QuantiFERON-TB Gold Plus: NEGATIVE

## 2021-04-04 DIAGNOSIS — E890 Postprocedural hypothyroidism: Secondary | ICD-10-CM | POA: Diagnosis not present

## 2021-04-04 DIAGNOSIS — E785 Hyperlipidemia, unspecified: Secondary | ICD-10-CM | POA: Diagnosis not present

## 2021-04-04 DIAGNOSIS — G500 Trigeminal neuralgia: Secondary | ICD-10-CM | POA: Diagnosis not present

## 2021-04-04 DIAGNOSIS — I10 Essential (primary) hypertension: Secondary | ICD-10-CM | POA: Diagnosis not present

## 2021-04-04 DIAGNOSIS — C50919 Malignant neoplasm of unspecified site of unspecified female breast: Secondary | ICD-10-CM | POA: Diagnosis not present

## 2021-04-04 DIAGNOSIS — M1990 Unspecified osteoarthritis, unspecified site: Secondary | ICD-10-CM | POA: Diagnosis not present

## 2021-04-04 DIAGNOSIS — G8929 Other chronic pain: Secondary | ICD-10-CM | POA: Diagnosis not present

## 2021-04-04 DIAGNOSIS — H409 Unspecified glaucoma: Secondary | ICD-10-CM | POA: Diagnosis not present

## 2021-04-04 DIAGNOSIS — R7303 Prediabetes: Secondary | ICD-10-CM | POA: Diagnosis not present

## 2021-04-06 ENCOUNTER — Encounter: Payer: Self-pay | Admitting: Internal Medicine

## 2021-04-18 ENCOUNTER — Other Ambulatory Visit: Payer: Self-pay | Admitting: Internal Medicine

## 2021-04-18 DIAGNOSIS — E1165 Type 2 diabetes mellitus with hyperglycemia: Secondary | ICD-10-CM

## 2021-06-15 ENCOUNTER — Encounter: Payer: Self-pay | Admitting: Internal Medicine

## 2021-06-15 ENCOUNTER — Other Ambulatory Visit: Payer: Self-pay

## 2021-06-15 ENCOUNTER — Ambulatory Visit (INDEPENDENT_AMBULATORY_CARE_PROVIDER_SITE_OTHER): Payer: Medicare PPO | Admitting: Internal Medicine

## 2021-06-15 VITALS — BP 130/60 | HR 103 | Temp 98.6°F | Ht 64.2 in | Wt 174.4 lb

## 2021-06-15 DIAGNOSIS — Z79899 Other long term (current) drug therapy: Secondary | ICD-10-CM

## 2021-06-15 DIAGNOSIS — Z23 Encounter for immunization: Secondary | ICD-10-CM

## 2021-06-15 DIAGNOSIS — Z6829 Body mass index (BMI) 29.0-29.9, adult: Secondary | ICD-10-CM | POA: Diagnosis not present

## 2021-06-15 DIAGNOSIS — E1165 Type 2 diabetes mellitus with hyperglycemia: Secondary | ICD-10-CM | POA: Diagnosis not present

## 2021-06-15 DIAGNOSIS — Z0000 Encounter for general adult medical examination without abnormal findings: Secondary | ICD-10-CM

## 2021-06-15 DIAGNOSIS — I10 Essential (primary) hypertension: Secondary | ICD-10-CM | POA: Diagnosis not present

## 2021-06-15 DIAGNOSIS — E890 Postprocedural hypothyroidism: Secondary | ICD-10-CM

## 2021-06-15 DIAGNOSIS — F5101 Primary insomnia: Secondary | ICD-10-CM | POA: Diagnosis not present

## 2021-06-15 DIAGNOSIS — H6123 Impacted cerumen, bilateral: Secondary | ICD-10-CM | POA: Diagnosis not present

## 2021-06-15 MED ORDER — TETANUS-DIPHTH-ACELL PERTUSSIS 5-2.5-18.5 LF-MCG/0.5 IM SUSP: 0.5000 mL | Freq: Once | INTRAMUSCULAR | 0 refills | Status: AC

## 2021-06-15 NOTE — Patient Instructions (Signed)

## 2021-06-15 NOTE — Progress Notes (Signed)
Rich Brave [place],acting as a Education administrator for [name], MD.,have documented all relevant documentation on the behalf of [name], MD,as directed by  [name], MD while in the presence of [name], MD.  This visit occurred during the SARS-CoV-2 public health emergency.  Safety protocols were in place, including screening questions prior to the visit, additional usage of staff PPE, and extensive cleaning of exam room while observing appropriate contact time as indicated for disinfecting solutions.  Subjective:     Patient ID: Christina Hernandez , female    DOB: April 13, 1951 , 70 y.o.   MRN: [id number]   Chief Complaint  Patient presents with   Annual Exam   Diabetes   Hypertension    HPI  She is here today for a full physical examination. She is followed by Dr. Simona Huh for her GYN exams. She has no specific concerns at this time. Unfortunately, her husband passed in September 2022. She states she is still grieving his death.   Diabetes She presents for her follow-up diabetic visit. She has type 2 diabetes mellitus. There are no hypoglycemic associated symptoms. There are no diabetic associated symptoms. Pertinent negatives for diabetes include no blurred vision and no chest pain. There are no hypoglycemic complications. Risk factors for coronary artery disease include diabetes mellitus, dyslipidemia, hypertension, sedentary lifestyle and post-menopausal. She participates in exercise intermittently. An ACE inhibitor/angiotensin II receptor blocker is being taken. Eye exam is current.  Hypertension This is a chronic problem. The current episode started more than 1 year ago. The problem has been gradually improving since onset. The problem is controlled. Pertinent negatives include no blurred vision, chest pain, palpitations or shortness of breath. Compliance problems include exercise.     Past Medical History:  Diagnosis Date   Arthritis    Breast cancer, right  breast ([place])    pending right breast lumpectomy on September 01, 2018   Diabetes mellitus type II, controlled ([place])    On Kombiglyze   Family history of ovarian cancer    Family history of thyroid cancer    GERD (gastroesophageal reflux disease)    rarely occurs- no meds   Hyperlipidemia associated with type 2 diabetes mellitus ([place])    Hypertension    Hypothyroidism    Personal history of radiation therapy    Primary localized osteoarthritis of right knee 04/17/2019   S/P knee replacement 04/17/2019   Trigeminal neuralgia of left side of face 2015   s/p Gamma knife     Family History  Problem Relation Age of Onset   Hypertension Mother    Kidney failure Mother    Congestive Heart Failure Mother    Heart Problems Father    Stroke Father    Pneumonia Father    Heart Problems Sister 85       heart transplant   Stroke Paternal Grandmother    Dementia Paternal Grandmother    Stroke Paternal Grandfather    Cardiomyopathy Sister    Ovarian cancer Sister 58       d. 39   Thyroid disease Sister    Thyroid disease Sister    Thyroid cancer Niece 81   Breast cancer Neg Hx      Current Outpatient Medications:    Accu-Chek FastClix Lancets MISC, Use as instructed to check blood sugars 1 time per day dx: e11.22, Disp: 150 each, Rfl: 2   anastrozole (ARIMIDEX) 1 MG tablet, Take 1 tablet (1 mg total) by mouth daily. Start Apr 29, 2019, Disp: 90 tablet, Rfl: 4   baclofen (LIORESAL) 10 MG tablet, TAKE 1 TABLET BY MOUTH THREE TIMES A DAY AS NEEDED FOR MUSCLE SPASMS, Disp: 270 tablet, Rfl: 0   Cholecalciferol (VITAMIN D) 125 MCG (5000 UT) CAPS, Take 5,000 Units by mouth daily., Disp: , Rfl:    COMBIGAN 0.2-0.5 % ophthalmic solution, Place 1 drop into both eyes 2 (two) times daily., Disp: , Rfl:    DULoxetine (CYMBALTA) 60 MG capsule, Take 1 capsule (60 mg total) by mouth daily., Disp: 90 capsule, Rfl: 1   gabapentin (NEURONTIN) 300 MG capsule, Take 3 capsules (900 mg total) by mouth 3 (three)  times daily., Disp: 810 capsule, Rfl: 3   KOMBIGLYZE XR 5-500 MG TB24, TAKE 1 TABLET BY MOUTH DAILY BEFORE SUPPER., Disp: 90 tablet, Rfl: 1   latanoprost (XALATAN) 0.005 % ophthalmic solution, Place 1 drop into both eyes at bedtime., Disp: , Rfl:    levothyroxine (SYNTHROID) 75 MCG tablet, Take 1 tablet by mouth on Sat - Sunday only, Disp: 90 tablet, Rfl: 1   losartan-hydrochlorothiazide (HYZAAR) 50-12.5 MG tablet, TAKE 1 TABLET BY MOUTH EVERY DAY, Disp: 90 tablet, Rfl: 1   pravastatin (PRAVACHOL) 80 MG tablet, TAKE ONE TAB BY MOUTH M-F, SKIP SAT/SUN, Disp: 75 tablet, Rfl: 2   SYNTHROID 50 MCG tablet, Take one daily M-F, Disp: 30 tablet, Rfl: 11   benzonatate (TESSALON) 100 MG capsule, Take 1 capsule (100 mg total) by mouth every 8 (eight) hours. (Patient not taking: Reported on 06/15/2021), Disp: 21 capsule, Rfl: 0   Allergies  Allergen Reactions   Cyclobenzaprine Nausea Only      The patient states she uses post menopausal status for birth control. Last LMP was No LMP recorded. Patient is postmenopausal.. Negative for Dysmenorrhea. Negative for: breast discharge, breast lump(s), breast pain and breast self exam. Associated symptoms include abnormal vaginal bleeding. Pertinent negatives include abnormal bleeding (hematology), anxiety, decreased libido, depression, difficulty falling sleep, dyspareunia, history of infertility, nocturia, sexual dysfunction, sleep disturbances, urinary incontinence, urinary urgency, vaginal discharge and vaginal itching. Diet regular.The patient states her exercise level is  intermittent.  . The patient's tobacco use is:  Social History   Tobacco Use  Smoking Status Never  Smokeless Tobacco Never  . She has been exposed to passive smoke. The patient's alcohol use is:  Social History   Substance and Sexual Activity  Alcohol Use No   Review of Systems  Constitutional: Negative.   HENT: Negative.    Eyes: Negative.  Negative for blurred vision.   Respiratory: Negative.  Negative for shortness of breath.   Cardiovascular: Negative.  Negative for chest pain and palpitations.  Endocrine: Negative.   Genitourinary: Negative.   Musculoskeletal: Negative.   Skin: Negative.   Allergic/Immunologic: Negative.   Neurological: Negative.   Hematological: Negative.   Psychiatric/Behavioral:  Positive for sleep disturbance.     Today's Vitals   06/15/21 1450  BP: 130/60  Pulse: (!) 103  Temp: 98.6 F (37 C)  Weight: 174 lb 6.4 oz (79.1 kg)  Height: 5' 4.2" (1.631 m)   Body mass index is 29.75 kg/m.  Wt Readings from Last 3 Encounters:  06/15/21 174 lb 6.4 oz (79.1 kg)  03/12/21 177 lb 0.5 oz (80.3 kg)  03/12/21 177 lb (80.3 kg)     Objective:  Physical Exam Vitals and nursing note reviewed.  Constitutional:      Appearance: Normal appearance.  HENT:     Head: Normocephalic and atraumatic.  Right Ear: Ear canal and external ear normal. There is impacted cerumen.     Left Ear: Ear canal and external ear normal. There is impacted cerumen.     Nose:     Comments: Masked     Mouth/Throat:     Comments: Masked  Eyes:     Extraocular Movements: Extraocular movements intact.     Conjunctiva/sclera: Conjunctivae normal.     Pupils: Pupils are equal, round, and reactive to light.  Cardiovascular:     Rate and Rhythm: Normal rate and regular rhythm.     Pulses: Normal pulses.          Dorsalis pedis pulses are 2+ on the right side and 2+ on the left side.     Heart sounds: Normal heart sounds.  Pulmonary:     Effort: Pulmonary effort is normal.     Breath sounds: Normal breath sounds.  Chest:  Breasts:    Tanner Score is 5.     Right: Normal.     Left: Normal.     Comments: Healed surgical scar on the right Abdominal:     General: Abdomen is flat. Bowel sounds are normal.     Palpations: Abdomen is soft.  Genitourinary:    Comments: deferred Musculoskeletal:        General: Normal range of motion.     Cervical  back: Normal range of motion and neck supple.  Feet:     Right foot:     Protective Sensation: 5 sites tested.  5 sites sensed.     Skin integrity: Skin integrity normal.     Toenail Condition: Right toenails are normal.     Left foot:     Protective Sensation: 5 sites tested.  5 sites sensed.     Skin integrity: Skin integrity normal.     Toenail Condition: Left toenails are normal.  Skin:    General: Skin is warm and dry.  Neurological:     General: No focal deficit present.     Mental Status: She is alert and oriented to person, place, and time.  Psychiatric:        Mood and Affect: Mood normal.        Behavior: Behavior normal.        Assessment And Plan:     1. Encounter for general adult medical examination w/o abnormal findings Comments: A full exam was performed. Importance of monthly self breast exams was discussed with the patient. PATIENT IS ADVISED TO GET 30-45 MINUTES REGULAR EXERCISE NO LESS THAN FOUR TO FIVE DAYS PER WEEK - BOTH WEIGHTBEARING EXERCISES AND AEROBIC ARE RECOMMENDED.  PATIENT IS ADVISED TO FOLLOW A HEALTHY DIET WITH AT LEAST SIX FRUITS/VEGGIES PER DAY, DECREASE INTAKE OF RED MEAT, AND TO INCREASE FISH INTAKE TO TWO DAYS PER WEEK.  MEATS/FISH SHOULD NOT BE FRIED, BAKED OR BROILED IS PREFERABLE.  IT IS ALSO IMPORTANT TO CUT BACK ON YOUR SUGAR INTAKE. PLEASE AVOID ANYTHING WITH ADDED SUGAR, CORN SYRUP OR OTHER SWEETENERS. IF YOU MUST USE A SWEETENER, YOU CAN TRY STEVIA. IT IS ALSO IMPORTANT TO AVOID ARTIFICIALLY SWEETENERS AND DIET BEVERAGES. LASTLY, I SUGGEST WEARING SPF 50 SUNSCREEN ON EXPOSED PARTS AND ESPECIALLY WHEN IN THE DIRECT SUNLIGHT FOR AN EXTENDED PERIOD OF TIME.  PLEASE AVOID FAST FOOD RESTAURANTS AND INCREASE YOUR WATER INTAKE.  2. Uncontrolled type 2 diabetes mellitus with hyperglycemia ([place]) Comments: Diabetic foot exam was performed. I will check labs as below and adjust meds as needed. She will  rto in 4 months for re-evaluation. I DISCUSSED WITH  THE PATIENT AT LENGTH REGARDING THE GOALS OF GLYCEMIC CONTROL AND POSSIBLE LONG-TERM COMPLICATIONS.  I  ALSO STRESSED THE IMPORTANCE OF COMPLIANCE WITH HOME GLUCOSE MONITORING, DIETARY RESTRICTIONS INCLUDING AVOIDANCE OF SUGARY DRINKS/PROCESSED FOODS,  ALONG WITH REGULAR EXERCISE.  I  ALSO STRESSED THE IMPORTANCE OF ANNUAL EYE EXAMS, SELF FOOT CARE AND COMPLIANCE WITH OFFICE VISITS.  - CMP14+EGFR - Hemoglobin A1c  3. Essential hypertension Comments: Chronic, controlled. EKG performed, ST w/o acute changes.  She is encouraged to stay well hydrated and limit sodium itnake. She will rto in six months for re-evaluation.  - CMP14+EGFR - CBC no Diff - EKG 12-Lead  4. Postoperative hypothyroidism Comments: Chronic, I will check thyroid panel and adjust meds as needed. She will rto in six months for re-evaluation.  - CBC no Diff - TSH - T4, Free  5. Bilateral impacted cerumen AFTER OBTAINING VERBAL CONSENT, BOTH EARS WERE FLUSHED BY IRRIGATION. SHE TOLERATED PROCEDURE WELL WITHOUT ANY COMPLICATIONS. NO TM ABNORMALITIES WERE NOTED. - Ear Lavage  6. Primary insomnia Comments: She was given samples of Belsomra to take nightly. Advised to take no later than 9pm and to create a bedtime routine.   7. BMI 29.0-29.9,adult Comments: Her BMI is acceptable for her demographic. Encouraged to aim for at least 150 minutes of exercise per week.   8. Encounter for long-term (current) use of medications  9. Immunization due Comments: I will send rx Boostrix (Tdap) to her local pharmacy.   - Tdap (BOOSTRIX) 5-2.5-18.5 LF-MCG/0.5 injection; Inject 0.5 mLs into the muscle once for 1 dose.  Dispense: 0.5 mL; Refill: 0  Patient was given opportunity to ask questions. Patient verbalized understanding of the plan and was able to repeat key elements of the plan. All questions were answered to their satisfaction.   I, [name], MD, have reviewed all documentation for this visit. The documentation on 06/15/21  for the exam, diagnosis, procedures, and orders are all accurate and complete.   THE PATIENT IS ENCOURAGED TO PRACTICE SOCIAL DISTANCING DUE TO THE COVID-19 PANDEMIC.

## 2021-06-16 LAB — CBC
Hematocrit: 37.5 % (ref 34.0–46.6)
Hemoglobin: 12.6 g/dL (ref 11.1–15.9)
MCH: 27.2 pg (ref 26.6–33.0)
MCHC: 33.6 g/dL (ref 31.5–35.7)
MCV: 81 fL (ref 79–97)
Platelets: 305 10*3/uL (ref 150–450)
RBC: 4.64 x10E6/uL (ref 3.77–5.28)
RDW: 13.2 % (ref 11.7–15.4)
WBC: 6.1 10*3/uL (ref 3.4–10.8)

## 2021-06-16 LAB — CMP14+EGFR
ALT: 18 IU/L (ref 0–32)
AST: 19 IU/L (ref 0–40)
Albumin/Globulin Ratio: 1.5 (ref 1.2–2.2)
Albumin: 4.5 g/dL (ref 3.7–4.7)
Alkaline Phosphatase: 136 IU/L — ABNORMAL HIGH (ref 44–121)
BUN/Creatinine Ratio: 20 (ref 12–28)
BUN: 14 mg/dL (ref 8–27)
Bilirubin Total: 0.5 mg/dL (ref 0.0–1.2)
CO2: 26 mmol/L (ref 20–29)
Calcium: 10.1 mg/dL (ref 8.7–10.3)
Chloride: 101 mmol/L (ref 96–106)
Creatinine, Ser: 0.69 mg/dL (ref 0.57–1.00)
Globulin, Total: 3 g/dL (ref 1.5–4.5)
Glucose: 71 mg/dL (ref 70–99)
Potassium: 4.1 mmol/L (ref 3.5–5.2)
Sodium: 141 mmol/L (ref 134–144)
Total Protein: 7.5 g/dL (ref 6.0–8.5)
eGFR: 92 mL/min/{1.73_m2} (ref >59)

## 2021-06-16 LAB — HEMOGLOBIN A1C
Est. average glucose Bld gHb Est-mCnc: 140 mg/dL
Hgb A1c MFr Bld: 6.5 % — ABNORMAL HIGH (ref 4.8–5.6)

## 2021-06-16 LAB — TSH: TSH: 0.692 u[IU]/mL (ref 0.450–4.500)

## 2021-06-16 LAB — T4, FREE: Free T4: 0.89 ng/dL (ref 0.82–1.77)

## 2021-06-25 DIAGNOSIS — E119 Type 2 diabetes mellitus without complications: Secondary | ICD-10-CM | POA: Diagnosis not present

## 2021-06-25 DIAGNOSIS — H2513 Age-related nuclear cataract, bilateral: Secondary | ICD-10-CM | POA: Diagnosis not present

## 2021-06-25 DIAGNOSIS — G500 Trigeminal neuralgia: Secondary | ICD-10-CM | POA: Diagnosis not present

## 2021-06-25 DIAGNOSIS — H02403 Unspecified ptosis of bilateral eyelids: Secondary | ICD-10-CM | POA: Diagnosis not present

## 2021-06-25 DIAGNOSIS — H401133 Primary open-angle glaucoma, bilateral, severe stage: Secondary | ICD-10-CM | POA: Diagnosis not present

## 2021-07-10 ENCOUNTER — Other Ambulatory Visit: Payer: Self-pay | Admitting: Internal Medicine

## 2021-07-16 ENCOUNTER — Other Ambulatory Visit: Payer: Self-pay

## 2021-07-16 DIAGNOSIS — E890 Postprocedural hypothyroidism: Secondary | ICD-10-CM

## 2021-07-16 DIAGNOSIS — E1165 Type 2 diabetes mellitus with hyperglycemia: Secondary | ICD-10-CM

## 2021-07-16 MED ORDER — GABAPENTIN 300 MG PO CAPS: 900.0000 mg | ORAL_CAPSULE | Freq: Three times a day (TID) | ORAL | 3 refills | Status: DC

## 2021-07-16 MED ORDER — LEVOTHYROXINE SODIUM 75 MCG PO TABS: ORAL_TABLET | ORAL | 1 refills | Status: DC

## 2021-07-17 ENCOUNTER — Other Ambulatory Visit: Payer: Self-pay | Admitting: Internal Medicine

## 2021-07-17 DIAGNOSIS — Z853 Personal history of malignant neoplasm of breast: Secondary | ICD-10-CM

## 2021-07-27 ENCOUNTER — Other Ambulatory Visit: Payer: Self-pay | Admitting: Internal Medicine

## 2021-08-05 DIAGNOSIS — Z03818 Encounter for observation for suspected exposure to other biological agents ruled out: Secondary | ICD-10-CM | POA: Diagnosis not present

## 2021-08-05 DIAGNOSIS — Z20822 Contact with and (suspected) exposure to covid-19: Secondary | ICD-10-CM | POA: Diagnosis not present

## 2021-08-12 DIAGNOSIS — L821 Other seborrheic keratosis: Secondary | ICD-10-CM | POA: Diagnosis not present

## 2021-08-12 DIAGNOSIS — Z23 Encounter for immunization: Secondary | ICD-10-CM | POA: Diagnosis not present

## 2021-08-24 ENCOUNTER — Ambulatory Visit
Admission: RE | Admit: 2021-08-24 | Discharge: 2021-08-24 | Disposition: A | Discharge status: Home / Self Care | Payer: Medicare PPO | Source: Ambulatory Visit | Attending: Internal Medicine | Admitting: Internal Medicine

## 2021-08-24 ENCOUNTER — Other Ambulatory Visit: Payer: Self-pay

## 2021-08-24 ENCOUNTER — Other Ambulatory Visit: Payer: Self-pay | Admitting: Internal Medicine

## 2021-08-24 DIAGNOSIS — N6310 Unspecified lump in the right breast, unspecified quadrant: Secondary | ICD-10-CM

## 2021-08-24 DIAGNOSIS — Z853 Personal history of malignant neoplasm of breast: Secondary | ICD-10-CM

## 2021-08-24 DIAGNOSIS — R922 Inconclusive mammogram: Secondary | ICD-10-CM | POA: Diagnosis not present

## 2021-08-31 ENCOUNTER — Other Ambulatory Visit: Payer: Self-pay

## 2021-08-31 ENCOUNTER — Encounter: Payer: Self-pay | Admitting: Internal Medicine

## 2021-08-31 ENCOUNTER — Telehealth: Payer: Self-pay | Admitting: Hematology and Oncology

## 2021-08-31 ENCOUNTER — Ambulatory Visit: Payer: Medicare PPO | Admitting: Internal Medicine

## 2021-08-31 VITALS — BP 128/78 | HR 121 | Temp 98.1°F | Ht 64.0 in | Wt 174.8 lb

## 2021-08-31 DIAGNOSIS — E890 Postprocedural hypothyroidism: Secondary | ICD-10-CM

## 2021-08-31 DIAGNOSIS — L299 Pruritus, unspecified: Secondary | ICD-10-CM

## 2021-08-31 DIAGNOSIS — I700 Atherosclerosis of aorta: Secondary | ICD-10-CM | POA: Diagnosis not present

## 2021-08-31 DIAGNOSIS — L509 Urticaria, unspecified: Secondary | ICD-10-CM

## 2021-08-31 DIAGNOSIS — C50011 Malignant neoplasm of nipple and areola, right female breast: Secondary | ICD-10-CM

## 2021-08-31 MED ORDER — TRIAMCINOLONE ACETONIDE 40 MG/ML IJ SUSP
40.0000 mg | Freq: Once | INTRAMUSCULAR | Status: AC
  Administered 2021-08-31: 40 mg via INTRAMUSCULAR

## 2021-08-31 MED ORDER — HYDROXYZINE HCL 10 MG PO TABS: 10.0000 mg | ORAL_TABLET | Freq: Three times a day (TID) | ORAL | 0 refills | Status: DC | PRN

## 2021-08-31 MED ORDER — SYNTHROID 50 MCG PO TABS: ORAL_TABLET | ORAL | 11 refills | Status: DC

## 2021-08-31 MED ORDER — FAMOTIDINE 20 MG PO TABS: 20.0000 mg | ORAL_TABLET | Freq: Two times a day (BID) | ORAL | 1 refills | Status: DC

## 2021-08-31 NOTE — Progress Notes (Signed)
?I,[name],acting as a scribe for [name], MD.,have documented all relevant documentation on the behalf of [name], MD,as directed by  [name], MD while in the presence of [name], MD.  ?This visit occurred during the SARS-CoV-2 public health emergency.  Safety protocols were in place, including screening questions prior to the visit, additional usage of staff PPE, and extensive cleaning of exam room while observing appropriate contact time as indicated for disinfecting solutions. ? ?Subjective:  ?  ? Patient ID: [name] , female    DOB: 1951/01/20 , 71 y.o.   MRN: [id number] ? ? ?No chief complaint on file. ? ? ?HPI ? ?Pt presents today for itching. She states the itching started yesterday on her hands. She reports she was unable to sleep due to the itching. This morning, the itching spread to her arms, thighs, stomach, and chest. She is here today because she is not what is causing with her sx.  She took a Benadryl this morning which has helped with her sx. She denies being outside in her yard. She admits to using a new soap on Saturday and/or Sunday. She has just started using Lume soap. She admits to using different lotions as well. She also admits to eating a spicy pasta salad.  ?  ? ?Past Medical History:  ?Diagnosis Date  ? Arthritis   ? Breast cancer, right breast ([place])   ? pending right breast lumpectomy on September 01, 2018  ? Diabetes mellitus type II, controlled ([place])   ? On Kombiglyze  ? Family history of ovarian cancer   ? Family history of thyroid cancer   ? GERD (gastroesophageal reflux disease)   ? rarely occurs- no meds  ? Hyperlipidemia associated with type 2 diabetes mellitus ([place])   ? Hypertension   ? Hypothyroidism   ? Personal history of radiation therapy   ? Primary localized osteoarthritis of right knee 04/17/2019  ? S/P knee replacement 04/17/2019  ? Trigeminal neuralgia of left side of face 2015  ? s/p Gamma knife  ?  ? ?Family History   ?Problem Relation Age of Onset  ? Hypertension Mother   ? Kidney failure Mother   ? Congestive Heart Failure Mother   ? Heart Problems Father   ? Stroke Father   ? Pneumonia Father   ? Heart Problems Sister 63  ?     heart transplant  ? Stroke Paternal Grandmother   ? Dementia Paternal Grandmother   ? Stroke Paternal Grandfather   ? Cardiomyopathy Sister   ? Ovarian cancer Sister 57  ?     d. 57  ? Thyroid disease Sister   ? Thyroid disease Sister   ? Thyroid cancer Niece 42  ? Breast cancer Neg Hx   ? ? ? ?Current Outpatient Medications:  ?  Accu-Chek FastClix Lancets MISC, Use as instructed to check blood sugars 1 time per day dx: e11.22, Disp: 150 each, Rfl: 2 ?  anastrozole (ARIMIDEX) 1 MG tablet, Take 1 tablet (1 mg total) by mouth daily. Start Apr 29, 2019, Disp: 90 tablet, Rfl: 4 ?  baclofen (LIORESAL) 10 MG tablet, TAKE 1 TABLET BY MOUTH THREE TIMES A DAY AS NEEDED FOR MUSCLE SPASMS, Disp: 270 tablet, Rfl: 0 ?  Cholecalciferol (VITAMIN D) 125 MCG (5000 UT) CAPS, Take 5,000 Units by mouth daily., Disp: , Rfl:  ?  COMBIGAN 0.2-0.5 % ophthalmic solution, Place 1 drop into both eyes 2 (two) times daily., Disp: ,  Rfl:  ?  DULoxetine (CYMBALTA) 60 MG capsule, TAKE 1 CAPSULE BY MOUTH EVERY DAY, Disp: 90 capsule, Rfl: 1 ?  famotidine (PEPCID) 20 MG tablet, Take 1 tablet (20 mg total) by mouth 2 (two) times daily., Disp: 60 tablet, Rfl: 1 ?  gabapentin (NEURONTIN) 300 MG capsule, Take 3 capsules (900 mg total) by mouth 3 (three) times daily., Disp: 810 capsule, Rfl: 3 ?  hydrOXYzine (ATARAX) 10 MG tablet, Take 1 tablet (10 mg total) by mouth 3 (three) times daily as needed., Disp: 30 tablet, Rfl: 0 ?  KOMBIGLYZE XR 5-500 MG TB24, TAKE 1 TABLET BY MOUTH DAILY BEFORE SUPPER., Disp: 90 tablet, Rfl: 1 ?  latanoprost (XALATAN) 0.005 % ophthalmic solution, Place 1 drop into both eyes at bedtime., Disp: , Rfl:  ?  levothyroxine (SYNTHROID) 75 MCG tablet, Take 1 tablet by mouth on Sat - Sunday only, Disp: 90 tablet, Rfl: 1 ?   losartan-hydrochlorothiazide (HYZAAR) 50-12.5 MG tablet, TAKE 1 TABLET BY MOUTH EVERY DAY, Disp: 90 tablet, Rfl: 1 ?  pravastatin (PRAVACHOL) 80 MG tablet, TAKE ONE TAB BY MOUTH M-F, SKIP SAT/SUN, Disp: 75 tablet, Rfl: 2 ?  SYNTHROID 50 MCG tablet, Take one daily M-F, Disp: 30 tablet, Rfl: 11 ?  benzonatate (TESSALON) 100 MG capsule, Take 1 capsule (100 mg total) by mouth every 8 (eight) hours. (Patient not taking: Reported on 06/15/2021), Disp: 21 capsule, Rfl: 0 ? ?Current Facility-Administered Medications:  ?  triamcinolone acetonide (KENALOG-40) injection 40 mg, 40 mg, Intramuscular, Once, [place] Chard, MD  ? ?Allergies  ?Allergen Reactions  ? Cyclobenzaprine Nausea Only  ?  ? ?Review of Systems  ?Constitutional: Negative.   ?Respiratory: Negative.    ?Cardiovascular: Negative.   ?Neurological: Negative.   ?Psychiatric/Behavioral: Negative.     ? ?Today's Vitals  ? 08/31/21 1421  ?BP: 128/78  ?Pulse: (!) 121  ?Temp: 98.1 ?F (36.7 ?C)  ?SpO2: 95%  ?Weight: 174 lb 12.8 oz (79.3 kg)  ?Height: '5\' 4"'$  (1.626 m)  ? ?Body mass index is 30 kg/m?.  ?Wt Readings from Last 3 Encounters:  ?08/31/21 174 lb 12.8 oz (79.3 kg)  ?06/15/21 174 lb 6.4 oz (79.1 kg)  ?03/12/21 177 lb 0.5 oz (80.3 kg)  ?  ?Objective:  ?Physical Exam ?Vitals and nursing note reviewed.  ?Constitutional:   ?   Appearance: Normal appearance.  ?HENT:  ?   Head: Normocephalic and atraumatic.  ?   Nose:  ?   Comments: Masked  ?   Mouth/Throat:  ?   Comments: Masked  ?Eyes:  ?   Extraocular Movements: Extraocular movements intact.  ?Cardiovascular:  ?   Rate and Rhythm: Normal rate and regular rhythm.  ?   Heart sounds: Normal heart sounds.  ?Pulmonary:  ?   Effort: Pulmonary effort is normal.  ?   Breath sounds: Normal breath sounds.  ?Musculoskeletal:  ?   Cervical back: Normal range of motion.  ?Skin: ?   General: Skin is warm.  ?Neurological:  ?   General: No focal deficit present.  ?   Mental Status: She is alert.  ?Psychiatric:     ?   Mood and  Affect: Mood normal.     ?   Behavior: Behavior normal.  ?   ?Assessment And Plan:  ?   ?1. Pruritus ?Comments: Unfortunately, there are many things that could be contributing to her sx. I will check labs as below. She was given Kenalog, '40mg'$  IM x1 and send rx hydroxyzine ?- triamcinolone acetonide (  KENALOG-40) injection 40 mg ?- CBC with Diff ?- Allergen food profile specific IgE (60109) ? ?2. Urticaria ?Comments: Please see above.  ?- triamcinolone acetonide (KENALOG-40) injection 40 mg ?- Allergen food profile specific IgE (32355) ? ?3. Atherosclerosis of aorta ([place]) ?Comments: Chronic, importance of compliance with statin therapy was discussed with the patient.  She agrees to resume ASA '81mg'$  daily once this allergic rxn resolves. ? ?4. Postoperative hypothyroidism ?Comments: She is scheduled for lab visit on Thursday, I will check TSH today and adjust meds as needed.  ?- TSH ? ?5. 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 She was started on adjuvant therapy, anostrazole in Nov 2020. She will complete this for five years. ?  ?Patient was given opportunity to ask questions. Patient verbalized understanding of the plan and was able to repeat key elements of the plan. All questions were answered to their satisfaction.  ? ?I, [name], MD, have reviewed all documentation for this visit. The documentation on 08/31/21 for the exam, diagnosis, procedures, and orders are all accurate and complete.  ? ?IF YOU HAVE BEEN REFERRED TO A SPECIALIST, IT MAY TAKE 1-2 WEEKS TO SCHEDULE/PROCESS THE REFERRAL. IF YOU HAVE NOT HEARD FROM US/SPECIALIST IN TWO WEEKS, PLEASE GIVE [place] A CALL AT [phone] X [phone].  ? ?THE PATIENT IS ENCOURAGED TO PRACTICE SOCIAL DISTANCING DUE TO THE COVID-19 PANDEMIC.   ?

## 2021-08-31 NOTE — Patient Instructions (Signed)
Pruritus Pruritus is an itchy feeling on the skin. One of the most common causes is dry skin, but many different things can cause itching. Most cases of itching do notrequire medical attention. Sometimes itchy skin can turn into a rash. Follow these instructions at home: Skin care  Apply moisturizing lotion to your skin as needed. Lotion that contains petroleum jelly is best. Take medicines or apply medicated creams only as told by your health care provider. This may include: Corticosteroid cream. Anti-itch lotions. Oral antihistamines. Apply a cool, wet cloth (cool compress) to the affected areas. Take baths with one of the following: Epsom salts. You can get these at your local pharmacy or grocery store. Follow the instructions on the packaging. Baking soda. Pour a small amount into the bath as told by your health care provider. Colloidal oatmeal. You can get this at your local pharmacy or grocery store. Follow the instructions on the packaging. Apply baking soda paste to your skin. To make the paste, stir water into a small amount of baking soda until it reaches a paste-like consistency. Do not scratch your skin. Do not take hot showers or baths, which can make itching worse. A cool shower may help with itching as long as you apply moisturizing lotion after the shower. Do not use scented soaps, detergents, perfumes, and cosmetic products. Instead, use gentle, unscented versions of these items.  General instructions Avoid wearing tight clothes. Keep a journal to help find out what is causing your itching. Write down: What you eat and drink. What cosmetic products you use. What soaps or detergents you use. What you wear, including jewelry. Use a humidifier. This keeps the air moist, which helps to prevent dry skin. Be aware of any changes in your itchiness. Contact a health care provider if: The itching does not go away after several days. You are unusually thirsty or urinating more  than normal. Your skin tingles or feels numb. Your skin or the white parts of your eyes turn yellow (jaundice). You feel weak. You have any of the following: Night sweats. Tiredness (fatigue). Weight loss. Abdominal pain. Summary Pruritus is an itchy feeling on the skin. One of the most common causes is dry skin, but many different conditions and factors can cause itching. Apply moisturizing lotion to your skin as needed. Lotion that contains petroleum jelly is best. Take medicines or apply medicated creams only as told by your health care provider. Do not take hot showers or baths. Do not use scented soaps, detergents, perfumes, or cosmetic products. This information is not intended to replace advice given to you by your health care provider. Make sure you discuss any questions you have with your healthcare provider. Document Revised: 06/28/2017 Document Reviewed: 06/28/2017 Elsevier Patient Education  2022 [employer]  

## 2021-08-31 NOTE — Telephone Encounter (Signed)
Rescheduled appointments per provider's template. Left message.  ?

## 2021-09-02 ENCOUNTER — Ambulatory Visit (HOSPITAL_COMMUNITY)
Admission: EM | Admit: 2021-09-02 | Discharge: 2021-09-02 | Disposition: A | Discharge status: Home / Self Care | Payer: Medicare PPO | Attending: Nurse Practitioner | Admitting: Nurse Practitioner

## 2021-09-02 ENCOUNTER — Encounter (HOSPITAL_COMMUNITY): Payer: Self-pay | Admitting: Emergency Medicine

## 2021-09-02 ENCOUNTER — Other Ambulatory Visit: Payer: Self-pay

## 2021-09-02 DIAGNOSIS — L509 Urticaria, unspecified: Secondary | ICD-10-CM | POA: Diagnosis not present

## 2021-09-02 LAB — CBC WITH DIFFERENTIAL/PLATELET
Basophils Absolute: 0 10*3/uL (ref 0.0–0.2)
Basos: 0 %
EOS (ABSOLUTE): 0.1 10*3/uL (ref 0.0–0.4)
Eos: 2 %
Hematocrit: 35.7 % (ref 34.0–46.6)
Hemoglobin: 11.8 g/dL (ref 11.1–15.9)
Immature Grans (Abs): 0 10*3/uL (ref 0.0–0.1)
Immature Granulocytes: 0 %
Lymphocytes Absolute: 0.8 10*3/uL (ref 0.7–3.1)
Lymphs: 18 %
MCH: 27.3 pg (ref 26.6–33.0)
MCHC: 33.1 g/dL (ref 31.5–35.7)
MCV: 82 fL (ref 79–97)
Monocytes Absolute: 0.3 10*3/uL (ref 0.1–0.9)
Monocytes: 6 %
Neutrophils Absolute: 3.1 10*3/uL (ref 1.4–7.0)
Neutrophils: 74 %
Platelets: 213 10*3/uL (ref 150–450)
RBC: 4.33 x10E6/uL (ref 3.77–5.28)
RDW: 13.7 % (ref 11.7–15.4)
WBC: 4.2 10*3/uL (ref 3.4–10.8)

## 2021-09-02 LAB — ALLERGEN FOOD PROFILE SPECIFIC IGE
Allergen Apple, IgE: <0.1 kU/L
Allergen Corn, IgE: <0.1 kU/L
Allergen Tomato, IgE: 0.12 kU/L — AB
Chicken IgE: <0.1 kU/L
Codfish IgE: <0.1 kU/L
Egg White IgE: <0.1 kU/L
IgE (Immunoglobulin E), Serum: 10 IU/mL (ref 6–495)
Milk IgE: <0.1 kU/L
Orange: <0.1 kU/L
Peanut IgE: 0.13 kU/L — AB
Shrimp IgE: <0.1 kU/L
Soybean IgE: <0.1 kU/L
Tuna: <0.1 kU/L
Wheat IgE: 0.12 kU/L — AB

## 2021-09-02 LAB — TSH: TSH: 1.73 u[IU]/mL (ref 0.450–4.500)

## 2021-09-02 MED ORDER — CETIRIZINE HCL 10 MG PO TABS: 10.0000 mg | ORAL_TABLET | Freq: Every day | ORAL | 0 refills | Status: DC

## 2021-09-02 MED ORDER — PREDNISONE 20 MG PO TABS: 40.0000 mg | ORAL_TABLET | Freq: Every day | ORAL | 0 refills | Status: AC

## 2021-09-02 NOTE — ED Notes (Addendum)
Patient reports generalized itching for a few days.  Patient did see her pcp on 08/31/2021 for the same.  Patient reports lips swollen this morning, but states this has reduced since noticing this morning.  Patient states her throat and tongue have not felt swollen at all.  Patient is speaking in complete sentences, respirations regular and unlabored.  Skin warm and dry.  Spoke to dr hagler about this patient.  Patient is being registered ?

## 2021-09-02 NOTE — ED Triage Notes (Signed)
Pt reports hives and lip swelling that started on Sunday. Pt reports she went to PCP Monday and got a shot of steroids for a possible allergic reaction. Pt reports the medication did not help and doesn't believe its an allergic reaction.  ?

## 2021-09-02 NOTE — Discharge Instructions (Addendum)
Take medication as prescribed.  Pick up the hydroxyzine and famotidine that are at your pharmacy currently, and occasions were prescribed by your primary care.  Some of the medications you have been prescribed will make you drowsy, make sure you do not drive, or operate any heavy equipment when taking medication. ?May take Benadryl as needed for severe itching. Medication will cause drowsiness.  ?Continue using the Aveeno colloidal oatmeal bath as needed for itching. ?Monitor your blood glucose levels closely while taking the steroid. ?Do not take hot showers, use lukewarm water when bathing. ?Follow-up with your primary care for your allergy results.  Also follow-up with your primary care if your symptoms do not improve. ? ? ?

## 2021-09-02 NOTE — ED Provider Notes (Signed)
[facility]    CSN: [id number] Arrival date & time: 09/02/21  1100      History   Chief Complaint Chief Complaint  Patient presents with   Allergic Reaction    HPI Christina Hernandez is a 71 y.o. female.   The patient is a 71 year old female who presents for complaints of itching.  The itching is generalized, and over her entire body.  She states that when is it is intense, it is under her breasts, on the inside of her legs, on her arms, and on her back.  Patient states her symptoms started 3 days ago.  She was seen at her care physician's office and given a "steroid shot".  Patient was also prescribed famotidine and hydroxyzine, but she was unaware that she had those medications at the pharmacy.  Patient states that itching has continued and has not gotten any better.  She has been using Aveeno colloidal oatmeal bath for the itching.  She also states that she had swelling in her lips that started this morning, but has since improved.  She denies any shortness of breath, difficulty breathing, tongue swelling, or throat closing.  She would like to know if she has scabies.  She denies any new exposures to medications, lotions, detergents, animals, or food.  She states that she started using Lumi soap prior to the onset of her symptoms.   Allergic Reaction Presenting symptoms: rash    Past Medical History:  Diagnosis Date   Arthritis    Breast cancer, right breast ([place])    pending right breast lumpectomy on September 01, 2018   Diabetes mellitus type II, controlled ([place])    On Kombiglyze   Family history of ovarian cancer    Family history of thyroid cancer    GERD (gastroesophageal reflux disease)    rarely occurs- no meds   Hyperlipidemia associated with type 2 diabetes mellitus ([place])    Hypertension    Hypothyroidism    Personal history of radiation therapy    Primary localized osteoarthritis of right knee 04/17/2019   S/P knee replacement 04/17/2019   Trigeminal  neuralgia of left side of face 2015   s/p Gamma knife    Patient Active Problem List   Diagnosis Date Noted   Primary insomnia 06/15/2021   Bilateral impacted cerumen 06/15/2021   Glossodynia 01/12/2021   Bruxism 01/12/2021   Atherosclerosis of aorta ([place]) 06/22/2020   Impacted cerumen of right ear 06/22/2020   Malignant neoplasm of nipple of right breast in female, unspecified estrogen receptor status ([place]) 03/12/2020   Primary localized osteoarthritis of right knee 04/17/2019   Genetic testing 08/28/2018   Pre-operative cardiovascular examination 08/24/2018   Nonspecific abnormal electrocardiogram (ECG) (EKG) 08/24/2018   Family history of ovarian cancer    Family history of thyroid cancer    Ductal carcinoma in situ (DCIS) of right breast 07/27/2018   Pure hypercholesterolemia 05/10/2018   Uncontrolled type 2 diabetes mellitus with hyperglycemia ([place]) 05/10/2018   Essential hypertension 05/10/2018   Bilateral primary osteoarthritis of knee 05/10/2018   Hypothyroidism 05/10/2018   H/O total thyroidectomy 09/08/2016   Trigeminal neuralgia of left side of face 04/26/2014    Past Surgical History:  Procedure Laterality Date   BREAST BIOPSY Right 07/24/2018   BREAST LUMPECTOMY Right 09/01/2018   BREAST LUMPECTOMY WITH RADIOACTIVE SEED LOCALIZATION Right 09/01/2018   Procedure: RIGHT BREAST LUMPECTOMY WITH RADIOACTIVE SEED LOCALIZATION;  Surgeon: [name], MD;  Location: [facility];  Service: General;  Laterality: Right;   COLONOSCOPY     Gamma Knife Left 04/08/2016   HYSTEROSCOPY WITH D & C  05/24/2011   Procedure: DILATATION AND CURETTAGE (D&C) /HYSTEROSCOPY;  Surgeon: [name], MD;  Location: [facility];  Service: Gynecology;  Laterality: N/A;   KNEE SURGERY Right 06/10/15   torn ligament   PARTIAL KNEE ARTHROPLASTY Right 04/17/2019   Procedure: UNICOMPARTMENTAL KNEE;  Surgeon: [name], MD;  Location: [facility];  Service: Orthopedics;  Laterality:  Right;   THYROIDECTOMY N/A 09/08/2016   Procedure: TOTAL THYROIDECTOMY;  Surgeon: [name], MD;  Location: [facility];  Service: ENT;  Laterality: N/A;   TUBAL LIGATION     uterine polyp     WISDOM TOOTH EXTRACTION Left 2014    OB History   No obstetric history on file.      Home Medications    Prior to Admission medications   Medication Sig Start Date End Date Taking? Authorizing Provider  cetirizine (ZYRTEC) 10 MG tablet Take 1 tablet (10 mg total) by mouth daily. 09/02/21 10/02/21 Yes Micah Galeno-Warren, Alda Lea, NP  predniSONE (DELTASONE) 20 MG tablet Take 2 tablets (40 mg total) by mouth daily with breakfast for 5 days. 09/02/21 09/07/21 Yes Charline Hoskinson-Warren, Alda Lea, NP  Accu-Chek FastClix Lancets MISC Use as instructed to check blood sugars 1 time per day dx: e11.22 09/17/19   Glendale Chard, MD  anastrozole (ARIMIDEX) 1 MG tablet Take 1 tablet (1 mg total) by mouth daily. Start Apr 29, 2019 12/15/20   Magrinat, [name], MD  baclofen (LIORESAL) 10 MG tablet TAKE 1 TABLET BY MOUTH THREE TIMES A DAY AS NEEDED FOR MUSCLE SPASMS 07/27/21   Glendale Chard, MD  benzonatate (TESSALON) 100 MG capsule Take 1 capsule (100 mg total) by mouth every 8 (eight) hours. Patient not taking: Reported on 06/15/2021 02/26/21   Hughie Closs, PA-C  Cholecalciferol (VITAMIN D) 125 MCG (5000 UT) CAPS Take 5,000 Units by mouth daily.    [provider]  COMBIGAN 0.2-0.5 % ophthalmic solution Place 1 drop into both eyes 2 (two) times daily. 03/15/19   [provider]  DULoxetine (CYMBALTA) 60 MG capsule TAKE 1 CAPSULE BY MOUTH EVERY DAY 07/27/21   Glendale Chard, MD  famotidine (PEPCID) 20 MG tablet Take 1 tablet (20 mg total) by mouth 2 (two) times daily. 08/31/21 08/31/22  Glendale Chard, MD  gabapentin (NEURONTIN) 300 MG capsule Take 3 capsules (900 mg total) by mouth 3 (three) times daily. 07/16/21   Glendale Chard, MD  hydrOXYzine (ATARAX) 10 MG tablet Take 1 tablet (10 mg total) by mouth 3 (three) times  daily as needed. 08/31/21   Glendale Chard, MD  KOMBIGLYZE XR 5-500 MG TB24 TAKE 1 TABLET BY MOUTH DAILY BEFORE SUPPER. 04/24/21   Glendale Chard, MD  latanoprost (XALATAN) 0.005 % ophthalmic solution Place 1 drop into both eyes at bedtime. 04/01/19   [provider]  levothyroxine (SYNTHROID) 75 MCG tablet Take 1 tablet by mouth on Sat - Sunday only 07/16/21   Glendale Chard, MD  losartan-hydrochlorothiazide [facility]) 50-12.5 MG tablet TAKE 1 TABLET BY MOUTH EVERY DAY 04/24/21   Glendale Chard, MD  pravastatin (PRAVACHOL) 80 MG tablet TAKE ONE TAB BY MOUTH M-F, SKIP SAT/SUN 04/24/21   Glendale Chard, MD  SYNTHROID 50 MCG tablet Take one daily M-F 08/31/21   Glendale Chard, MD    Family History Family History  Problem Relation Age of Onset   Hypertension Mother    Kidney failure Mother  Congestive Heart Failure Mother    Heart Problems Father    Stroke Father    Pneumonia Father    Heart Problems Sister 10       heart transplant   Stroke Paternal Grandmother    Dementia Paternal Grandmother    Stroke Paternal Grandfather    Cardiomyopathy Sister    Ovarian cancer Sister 74       d. 63   Thyroid disease Sister    Thyroid disease Sister    Thyroid cancer Niece 85   Breast cancer Neg Hx     Social History Social History   Tobacco Use   Smoking status: Never   Smokeless tobacco: Never  Vaping Use   Vaping Use: Never used  Substance Use Topics   Alcohol use: No   Drug use: No     Allergies   Cyclobenzaprine   Review of Systems Review of Systems  Constitutional: Negative.   HENT: Negative.    Respiratory: Negative.    Cardiovascular: Negative.   Gastrointestinal: Negative.   Skin:  Positive for rash.       Generalized itching  Psychiatric/Behavioral: Negative.      Physical Exam Triage Vital Signs ED Triage Vitals  Enc Vitals Group     BP 09/02/21 1145 132/81     Pulse Rate 09/02/21 1145 (!) 102     Resp 09/02/21 1145 16     Temp 09/02/21 1145 98.3  F (36.8 C)     Temp Source 09/02/21 1145 Oral     SpO2 09/02/21 1144 98 %     Weight 09/02/21 1143 174 lb 12.8 oz (79.3 kg)     Height 09/02/21 1143 '5\' 4"'$  (1.626 m)     Head Circumference --      Peak Flow --      Pain Score 09/02/21 1143 0     Pain Loc --      Pain Edu? --      Excl. in [place]? --    No data found.  Updated Vital Signs BP 132/81 (BP Location: Right Arm)    Pulse (!) 102    Temp 98.3 F (36.8 C) (Oral)    Resp 16    Ht '5\' 4"'$  (1.626 m)    Wt 174 lb 12.8 oz (79.3 kg)    SpO2 97%    BMI 30.00 kg/m   Visual Acuity Right Eye Distance:   Left Eye Distance:   Bilateral Distance:    Right Eye Near:   Left Eye Near:    Bilateral Near:     Physical Exam Constitutional:      General: She is in acute distress (mild distress d/t itching).     Appearance: Normal appearance.  HENT:     Head: Normocephalic and atraumatic.     Right Ear: Tympanic membrane, ear canal and external ear normal.     Left Ear: Tympanic membrane, ear canal and external ear normal.     Nose: Nose normal.     Mouth/Throat:     Mouth: Mucous membranes are moist.  Eyes:     Extraocular Movements: Extraocular movements intact.     Conjunctiva/sclera: Conjunctivae normal.     Pupils: Pupils are equal, round, and reactive to light.  Cardiovascular:     Rate and Rhythm: Normal rate and regular rhythm.     Pulses: Normal pulses.     Heart sounds: Normal heart sounds.  Pulmonary:     Effort: Pulmonary effort is normal.  Breath sounds: Normal breath sounds.  Abdominal:     General: Bowel sounds are normal.     Palpations: Abdomen is soft.  Musculoskeletal:     Cervical back: Normal range of motion and neck supple.  Skin:    General: Skin is warm and dry.     Comments: Generalized itching. No rash present at this time.  Neurological:     Mental Status: She is alert and oriented to person, place, and time.  Psychiatric:        Mood and Affect: Mood normal.        Behavior: Behavior normal.      UC Treatments / Results  Labs (all labs ordered are listed, but only abnormal results are displayed) Labs Reviewed - No data to display  EKG   Radiology No results found.  Procedures Procedures (including critical care time)  Medications Ordered in UC Medications - No data to display  Initial Impression / Assessment and Plan / UC Course  I have reviewed the triage vital signs and the nursing notes.  Pertinent labs & imaging results that were available during my care of the patient were reviewed by me and considered in my medical decision making (see chart for details).  Patient's symptoms are consistent with an urticarial rash.  She is concerned with state scabies, but there is no rash present on her exam today.  The patient was previously prescribed medication by her previous primary care on Monday that she has not started.  Instructed the patient to pick these medications up as they should help improve her symptoms.  I am going to also prescribe prednisone 40 mg for 5 days.  Patient instructed to monitor her blood glucose levels closely while taking this medication as it can cause hyperglycemia.  Patient was also encouraged to use Benadryl at bedtime for severe itching or rash.  Some of the medications previously prescribed such as the hydroxyzine will cause drowsiness, side effects discussed with the patient. Follow-up with your primary care for your allergy test  results and if symptoms do not improve. Final Clinical Impressions(s) / UC Diagnoses   Final diagnoses:  Urticaria     Discharge Instructions      Take medication as prescribed.  Pick up the hydroxyzine and famotidine that are at your pharmacy currently, and occasions were prescribed by your primary care.  Some of the medications you have been prescribed will make you drowsy, make sure you do not drive, or operate any heavy equipment when taking medication. May take Benadryl as needed for severe itching.  Medication will cause drowsiness.  Continue using the Aveeno colloidal oatmeal bath as needed for itching. Monitor your blood glucose levels closely while taking the steroid. Do not take hot showers, use lukewarm water when bathing. Follow-up with your primary care for your allergy results.  Also follow-up with your primary care if your symptoms do not improve.       ED Prescriptions     Medication Sig Dispense Auth. Provider   predniSONE (DELTASONE) 20 MG tablet Take 2 tablets (40 mg total) by mouth daily with breakfast for 5 days. 10 tablet Cyann Venti-Warren, Alda Lea, NP   cetirizine (ZYRTEC) 10 MG tablet Take 1 tablet (10 mg total) by mouth daily. 30 tablet Kamillah Didonato-Warren, Alda Lea, NP      PDMP not reviewed this encounter.   Tish Men, NP 09/02/21 1217

## 2021-09-03 ENCOUNTER — Other Ambulatory Visit: Payer: Medicare PPO

## 2021-09-07 ENCOUNTER — Other Ambulatory Visit: Payer: Self-pay | Admitting: Internal Medicine

## 2021-09-09 ENCOUNTER — Other Ambulatory Visit: Payer: Self-pay | Admitting: Internal Medicine

## 2021-09-15 DIAGNOSIS — L538 Other specified erythematous conditions: Secondary | ICD-10-CM | POA: Diagnosis not present

## 2021-09-15 DIAGNOSIS — L298 Other pruritus: Secondary | ICD-10-CM | POA: Diagnosis not present

## 2021-09-15 DIAGNOSIS — L821 Other seborrheic keratosis: Secondary | ICD-10-CM | POA: Diagnosis not present

## 2021-09-15 DIAGNOSIS — L820 Inflamed seborrheic keratosis: Secondary | ICD-10-CM | POA: Diagnosis not present

## 2021-09-15 DIAGNOSIS — R208 Other disturbances of skin sensation: Secondary | ICD-10-CM | POA: Diagnosis not present

## 2021-09-24 ENCOUNTER — Other Ambulatory Visit: Payer: Self-pay | Admitting: Internal Medicine

## 2021-09-24 ENCOUNTER — Other Ambulatory Visit: Payer: Self-pay | Admitting: Nurse Practitioner

## 2021-09-30 DIAGNOSIS — E119 Type 2 diabetes mellitus without complications: Secondary | ICD-10-CM | POA: Diagnosis not present

## 2021-09-30 DIAGNOSIS — H02403 Unspecified ptosis of bilateral eyelids: Secondary | ICD-10-CM | POA: Diagnosis not present

## 2021-09-30 DIAGNOSIS — G500 Trigeminal neuralgia: Secondary | ICD-10-CM | POA: Diagnosis not present

## 2021-09-30 DIAGNOSIS — H2513 Age-related nuclear cataract, bilateral: Secondary | ICD-10-CM | POA: Diagnosis not present

## 2021-09-30 DIAGNOSIS — H401133 Primary open-angle glaucoma, bilateral, severe stage: Secondary | ICD-10-CM | POA: Diagnosis not present

## 2021-10-11 DIAGNOSIS — R051 Acute cough: Secondary | ICD-10-CM | POA: Diagnosis not present

## 2021-10-11 DIAGNOSIS — R030 Elevated blood-pressure reading, without diagnosis of hypertension: Secondary | ICD-10-CM | POA: Diagnosis not present

## 2021-10-11 DIAGNOSIS — Z6830 Body mass index (BMI) 30.0-30.9, adult: Secondary | ICD-10-CM | POA: Diagnosis not present

## 2021-10-11 DIAGNOSIS — Z20822 Contact with and (suspected) exposure to covid-19: Secondary | ICD-10-CM | POA: Diagnosis not present

## 2021-10-11 DIAGNOSIS — E669 Obesity, unspecified: Secondary | ICD-10-CM | POA: Diagnosis not present

## 2021-10-16 ENCOUNTER — Other Ambulatory Visit: Payer: Self-pay | Admitting: Internal Medicine

## 2021-10-16 ENCOUNTER — Other Ambulatory Visit: Payer: Self-pay | Admitting: Diagnostic Neuroimaging

## 2021-10-16 ENCOUNTER — Ambulatory Visit: Payer: Medicare PPO | Admitting: Hematology and Oncology

## 2021-10-17 ENCOUNTER — Other Ambulatory Visit: Payer: Self-pay | Admitting: Internal Medicine

## 2021-10-19 ENCOUNTER — Ambulatory Visit: Payer: Medicare PPO | Admitting: Internal Medicine

## 2021-10-21 DIAGNOSIS — U071 COVID-19: Secondary | ICD-10-CM | POA: Diagnosis not present

## 2021-10-21 DIAGNOSIS — Z20822 Contact with and (suspected) exposure to covid-19: Secondary | ICD-10-CM | POA: Diagnosis not present

## 2021-11-05 ENCOUNTER — Encounter: Payer: Self-pay | Admitting: Nurse Practitioner

## 2021-11-05 ENCOUNTER — Ambulatory Visit (INDEPENDENT_AMBULATORY_CARE_PROVIDER_SITE_OTHER): Payer: Medicare PPO | Admitting: Nurse Practitioner

## 2021-11-05 VITALS — BP 124/72 | HR 105 | Temp 98.0°F | Ht 64.0 in | Wt 171.0 lb

## 2021-11-05 DIAGNOSIS — E1165 Type 2 diabetes mellitus with hyperglycemia: Secondary | ICD-10-CM

## 2021-11-05 DIAGNOSIS — I10 Essential (primary) hypertension: Secondary | ICD-10-CM

## 2021-11-05 DIAGNOSIS — E890 Postprocedural hypothyroidism: Secondary | ICD-10-CM

## 2021-11-05 DIAGNOSIS — F5101 Primary insomnia: Secondary | ICD-10-CM

## 2021-11-05 DIAGNOSIS — I700 Atherosclerosis of aorta: Secondary | ICD-10-CM | POA: Diagnosis not present

## 2021-11-05 DIAGNOSIS — Z6829 Body mass index (BMI) 29.0-29.9, adult: Secondary | ICD-10-CM

## 2021-11-05 DIAGNOSIS — H6123 Impacted cerumen, bilateral: Secondary | ICD-10-CM

## 2021-11-05 MED ORDER — DAPAGLIFLOZIN PROPANEDIOL 10 MG PO TABS: 10.0000 mg | ORAL_TABLET | Freq: Every day | ORAL | 1 refills | Status: DC

## 2021-11-05 NOTE — Progress Notes (Signed)
I,Christina Hernandez,acting as a Education administrator for [employer], FNP.,have documented all relevant documentation on the behalf of Christina Brine, FNP,as directed by  Christina Brine, FNP while in the presence of Christina Hernandez, Christina Hernandez.  This visit occurred during the SARS-CoV-2 public health emergency.  Safety protocols were in place, including screening questions prior to the visit, additional usage of staff PPE, and extensive cleaning of exam room while observing appropriate contact time as indicated for disinfecting solutions.  Subjective:     Patient ID: Christina Hernandez , female    DOB: 05/13/1951 , 71 y.o.   MRN: [id number]   Chief Complaint  Patient presents with   Diabetes    HPI  Patient presents today for a bp and diabetes f/u. She reports compliance with meds. She denies headaches, chest pain and shortness of breath. She would like to start Ozempic  Diabetes She presents for her follow-up diabetic visit. She has type 2 diabetes mellitus. There are no hypoglycemic associated symptoms. There are no diabetic associated symptoms. Pertinent negatives for diabetes include no blurred vision and no chest pain. There are no hypoglycemic complications. Risk factors for coronary artery disease include diabetes mellitus, dyslipidemia, hypertension, sedentary lifestyle and post-menopausal. She participates in exercise intermittently. An ACE inhibitor/angiotensin II receptor blocker is being taken. Eye exam is current.  Hypertension This is a chronic problem. The current episode started more than 1 year ago. The problem has been gradually improving since onset. The problem is controlled. Pertinent negatives include no blurred vision, chest pain, palpitations or shortness of breath. Risk factors for coronary artery disease include diabetes mellitus, dyslipidemia, post-menopausal state and sedentary lifestyle. The current treatment provides moderate improvement. Compliance problems include exercise.      Past  Medical History:  Diagnosis Date   Arthritis    Breast cancer, right breast ([place])    pending right breast lumpectomy on September 01, 2018   Diabetes mellitus type II, controlled ([place])    On Kombiglyze   Family history of ovarian cancer    Family history of thyroid cancer    GERD (gastroesophageal reflux disease)    rarely occurs- no meds   Hyperlipidemia associated with type 2 diabetes mellitus ([place])    Hypertension    Hypothyroidism    Personal history of radiation therapy    Primary localized osteoarthritis of right knee 04/17/2019   S/P knee replacement 04/17/2019   Trigeminal neuralgia of left side of face 2015   s/p Gamma knife     Family History  Problem Relation Age of Onset   Hypertension Mother    Kidney failure Mother    Congestive Heart Failure Mother    Heart Problems Father    Stroke Father    Pneumonia Father    Heart Problems Sister 109       heart transplant   Stroke Paternal Grandmother    Dementia Paternal Grandmother    Stroke Paternal Grandfather    Cardiomyopathy Sister    Ovarian cancer Sister 39       d. 57   Thyroid disease Sister    Thyroid disease Sister    Thyroid cancer Niece 66   Breast cancer Neg Hx      Current Outpatient Medications:    dapagliflozin propanediol (FARXIGA) 10 MG TABS tablet, Take 1 tablet (10 mg total) by mouth daily before breakfast., Disp: 90 tablet, Rfl: 1   Accu-Chek FastClix Lancets MISC, Use as instructed to check blood sugars 1 time per day dx: e11.22, Disp: 150  each, Rfl: 2   anastrozole (ARIMIDEX) 1 MG tablet, Take 1 tablet (1 mg total) by mouth daily. Start Apr 29, 2019, Disp: 90 tablet, Rfl: 4   baclofen (LIORESAL) 10 MG tablet, TAKE 1 TABLET BY MOUTH THREE TIMES A DAY AS NEEDED FOR MUSCLE SPASMS, Disp: 270 tablet, Rfl: 0   cetirizine (ZYRTEC) 10 MG tablet, TAKE 1 TABLET BY MOUTH EVERY DAY, Disp: 30 tablet, Rfl: 0   Cholecalciferol (VITAMIN D) 125 MCG (5000 UT) CAPS, Take 5,000 Units by mouth daily., Disp: , Rfl:     COMBIGAN 0.2-0.5 % ophthalmic solution, Place 1 drop into both eyes 2 (two) times daily., Disp: , Rfl:    DULoxetine (CYMBALTA) 60 MG capsule, TAKE 1 CAPSULE BY MOUTH EVERY DAY, Disp: 90 capsule, Rfl: 1   famotidine (PEPCID) 20 MG tablet, TAKE 1 TABLET BY MOUTH TWICE A DAY, Disp: 60 tablet, Rfl: 1   gabapentin (NEURONTIN) 300 MG capsule, Take 3 capsules (900 mg total) by mouth 3 (three) times daily., Disp: 810 capsule, Rfl: 3   hydrOXYzine (ATARAX) 10 MG tablet, Take 1 tablet (10 mg total) by mouth 3 (three) times daily as needed., Disp: 30 tablet, Rfl: 0   KOMBIGLYZE XR 5-500 MG TB24, TAKE 1 TABLET BY MOUTH DAILY BEFORE SUPPER., Disp: 90 tablet, Rfl: 1   latanoprost (XALATAN) 0.005 % ophthalmic solution, Place 1 drop into both eyes at bedtime., Disp: , Rfl:    levothyroxine (SYNTHROID) 50 MCG tablet, TAKE 1 TABLET BY MOUTH MONDAY - FRIDAY ONLY, Disp: 90 tablet, Rfl: 1   levothyroxine (SYNTHROID) 75 MCG tablet, TAKE 1 TABLET BY MOUTH ON SAT - SUNDAY ONLY, Disp: 90 tablet, Rfl: 1   losartan-hydrochlorothiazide (HYZAAR) 50-12.5 MG tablet, TAKE 1 TABLET BY MOUTH EVERY DAY, Disp: 90 tablet, Rfl: 1   pravastatin (PRAVACHOL) 80 MG tablet, TAKE ONE TAB BY MOUTH M-F, SKIP SAT/SUN, Disp: 75 tablet, Rfl: 2   Allergies  Allergen Reactions   Cyclobenzaprine Nausea Only     Review of Systems  Constitutional: Negative.   Eyes:  Negative for blurred vision.  Respiratory: Negative.  Negative for shortness of breath.   Cardiovascular: Negative.  Negative for chest pain and palpitations.  Gastrointestinal: Negative.   Neurological: Negative.      Today's Vitals   11/05/21 1540  BP: 124/72  Pulse: (!) 105  Temp: 98 F (36.7 C)  TempSrc: Oral  Weight: 171 lb (77.6 kg)  Height: '5\' 4"'  (1.626 m)   Body mass index is 29.35 kg/m.  Wt Readings from Last 3 Encounters:  11/20/21 174 lb (78.9 kg)  11/05/21 171 lb (77.6 kg)  09/02/21 174 lb 12.8 oz (79.3 kg)    Objective:  Physical  Exam Constitutional:      General: She is not in acute distress.    Appearance: Normal appearance. She is well-developed.  Cardiovascular:     Rate and Rhythm: Normal rate and regular rhythm.     Pulses: Normal pulses.     Heart sounds: Normal heart sounds. No murmur heard. Pulmonary:     Effort: Pulmonary effort is normal.     Breath sounds: Normal breath sounds.  Chest:     Chest wall: No tenderness.  Musculoskeletal:        General: Normal range of motion.  Skin:    General: Skin is warm and dry.     Capillary Refill: Capillary refill takes less than 2 seconds.  Neurological:     General: No focal deficit present.  Mental Status: She is alert and oriented to person, place, and time.     Cranial Nerves: No cranial nerve deficit.     Motor: No weakness.  Psychiatric:        Mood and Affect: Mood normal.        Behavior: Behavior normal.        Thought Content: Thought content normal.        Judgment: Judgment normal.         Assessment And Plan:     1. Uncontrolled type 2 diabetes mellitus with hyperglycemia (HCC) Comments: Stable, continue current medications. Will start her on Farxiga due to her history of thyroid issues. Discussed staying well hydrated and if has diarrhea/vomiting to hold Farxiga. - Hemoglobin A1c - CMP14+EGFR - Amb Referral To Provider Referral Exercise Program (P.R.E.P) - dapagliflozin propanediol (FARXIGA) 10 MG TABS tablet; Take 1 tablet (10 mg total) by mouth daily before breakfast.  Dispense: 90 tablet; Refill: 1  2. Essential hypertension Comments: Blood pressure is well controlled. Continue current medications - CMP14+EGFR - Amb Referral To Provider Referral Exercise Program (P.R.E.P)  3. Atherosclerosis of aorta (HCC) Comments: Continue statin, tolerating well.   4. Postoperative hypothyroidism Comments: Controlled, will recheck levels pending results will make changes - TSH - T4 - T3, free  5. Primary insomnia  6. BMI  29.0-29.9,adult She is encouraged to strive for BMI less than 30 to decrease cardiac risk. Advised to aim for at least 150 minutes of exercise per week. - Amb Referral To Provider Referral Exercise Program (P.R.E.P)  7. Excessive cerumen in both ear canals Comments: left ear is worse than right, lighted curette used to remove cerumen     Patient was given opportunity to ask questions. Patient verbalized understanding of the plan and was able to repeat key elements of the plan. All questions were answered to their satisfaction.  Christina Brine, FNP   I, Christina Brine, FNP, have reviewed all documentation for this visit. The documentation on 11/05/21 for the exam, diagnosis, procedures, and orders are all accurate and complete.   IF YOU HAVE BEEN REFERRED TO A SPECIALIST, IT MAY TAKE 1-2 WEEKS TO SCHEDULE/PROCESS THE REFERRAL. IF YOU HAVE NOT HEARD FROM US/SPECIALIST IN TWO WEEKS, PLEASE GIVE [place] A CALL AT [phone] X [phone].   THE PATIENT IS ENCOURAGED TO PRACTICE SOCIAL DISTANCING DUE TO THE COVID-19 PANDEMIC.

## 2021-11-05 NOTE — Patient Instructions (Signed)

## 2021-11-06 ENCOUNTER — Telehealth: Payer: Self-pay

## 2021-11-06 ENCOUNTER — Other Ambulatory Visit: Payer: Self-pay | Admitting: Internal Medicine

## 2021-11-06 DIAGNOSIS — E1165 Type 2 diabetes mellitus with hyperglycemia: Secondary | ICD-10-CM

## 2021-11-06 LAB — CMP14+EGFR
ALT: 22 IU/L (ref 0–32)
AST: 18 IU/L (ref 0–40)
Albumin/Globulin Ratio: 1.6 (ref 1.2–2.2)
Albumin: 4.2 g/dL (ref 3.7–4.7)
Alkaline Phosphatase: 107 IU/L (ref 44–121)
BUN/Creatinine Ratio: 17 (ref 12–28)
BUN: 13 mg/dL (ref 8–27)
Bilirubin Total: 0.7 mg/dL (ref 0.0–1.2)
CO2: 23 mmol/L (ref 20–29)
Calcium: 7.8 mg/dL — ABNORMAL LOW (ref 8.7–10.3)
Chloride: 101 mmol/L (ref 96–106)
Creatinine, Ser: 0.76 mg/dL (ref 0.57–1.00)
Globulin, Total: 2.7 g/dL (ref 1.5–4.5)
Glucose: 87 mg/dL (ref 70–99)
Potassium: 3.9 mmol/L (ref 3.5–5.2)
Sodium: 143 mmol/L (ref 134–144)
Total Protein: 6.9 g/dL (ref 6.0–8.5)
eGFR: 83 mL/min/{1.73_m2} (ref >59)

## 2021-11-06 LAB — HEMOGLOBIN A1C
Est. average glucose Bld gHb Est-mCnc: 131 mg/dL
Hgb A1c MFr Bld: 6.2 % — ABNORMAL HIGH (ref 4.8–5.6)

## 2021-11-06 LAB — TSH: TSH: 1.26 u[IU]/mL (ref 0.450–4.500)

## 2021-11-06 LAB — T3, FREE: T3, Free: 2.3 pg/mL (ref 2.0–4.4)

## 2021-11-06 LAB — T4: T4, Total: 9.2 ug/dL (ref 4.5–12.0)

## 2021-11-06 NOTE — Telephone Encounter (Signed)
Call to pt reference PREP referral ?Prefers to use mobile # for calls [phone] ? ?Explained program to pt and offered [facility] location (prefers [employer] not at that location yet likely end of summer/fall) ?Has eye surgery in June so starting in June would be difficult.  ?Would like to be called for future classes. Will check back to here when July classes are identified and see if those work for her.  ?Pt agreeable  ?

## 2021-11-10 ENCOUNTER — Ambulatory Visit: Payer: Medicare PPO | Admitting: Hematology and Oncology

## 2021-11-20 ENCOUNTER — Inpatient Hospital Stay: Payer: Medicare PPO | Attending: Hematology and Oncology | Admitting: Hematology and Oncology

## 2021-11-20 ENCOUNTER — Other Ambulatory Visit: Payer: Self-pay

## 2021-11-20 ENCOUNTER — Encounter: Payer: Self-pay | Admitting: Hematology and Oncology

## 2021-11-20 VITALS — BP 166/89 | HR 89 | Temp 97.6°F | Resp 16 | Wt 174.0 lb

## 2021-11-20 DIAGNOSIS — Z8041 Family history of malignant neoplasm of ovary: Secondary | ICD-10-CM | POA: Diagnosis not present

## 2021-11-20 DIAGNOSIS — I10 Essential (primary) hypertension: Secondary | ICD-10-CM | POA: Diagnosis not present

## 2021-11-20 DIAGNOSIS — Z808 Family history of malignant neoplasm of other organs or systems: Secondary | ICD-10-CM | POA: Diagnosis not present

## 2021-11-20 DIAGNOSIS — E119 Type 2 diabetes mellitus without complications: Secondary | ICD-10-CM | POA: Diagnosis not present

## 2021-11-20 DIAGNOSIS — Z853 Personal history of malignant neoplasm of breast: Secondary | ICD-10-CM | POA: Insufficient documentation

## 2021-11-20 DIAGNOSIS — Z79811 Long term (current) use of aromatase inhibitors: Secondary | ICD-10-CM | POA: Insufficient documentation

## 2021-11-20 DIAGNOSIS — Z923 Personal history of irradiation: Secondary | ICD-10-CM | POA: Insufficient documentation

## 2021-11-20 DIAGNOSIS — C50011 Malignant neoplasm of nipple and areola, right female breast: Secondary | ICD-10-CM | POA: Diagnosis not present

## 2021-11-20 DIAGNOSIS — D0511 Intraductal carcinoma in situ of right breast: Secondary | ICD-10-CM | POA: Diagnosis not present

## 2021-11-20 NOTE — Progress Notes (Signed)
[facility]  [phone] [phone]     ID: Christina Hernandez DOB: 04/14/1951  MR#: [id number]  JGO#:[id number]  Patient Care Team: Glendale Chard, MD as PCP - General (Internal Medicine) [name], MD as PCP - Cardiology (Cardiology) [name], MD as Consulting Physician (Obstetrics and Gynecology) [name], MD as Consulting Physician (General Surgery) Magrinat, [name], MD (Inactive) as Consulting Physician (Oncology) [name], MD as Consulting Physician (Radiation Oncology) [name], Earlean Polka, MD as Consulting Physician (Neurology) [name], MD as Consulting Physician (Pulmonary Disease) [name], MD as Consulting Physician (Orthopedic Surgery) Clydell Hakim, MD (Inactive) as Consulting Physician (Anesthesiology) Benay Pike, MD OTHER MD:   CHIEF COMPLAINT: Estrogen receptor positive breast cancer  CURRENT TREATMENT: Anastrozole  INTERVAL HISTORY:  Christina Hernandez returns today for follow up of her noninvasive breast cancer. She started on anastrozole on 04/29/2019.  She is tolerating this well, with occasional hot flashes (which she was not having before). Mammogram February 2023 with no suspicious mammographic or targeted sonographic abnormalities in the medial right breast.  Stable right breast lumpectomy site.  No mammographic evidence of malignancy in bilateral breasts She also underwent bone density screening on 10/18/2020 showing a T-score of +0.2, which is considered normal. She complains of some excess hair growth on the chin but otherwise no complaints.  Rest of the pertinent 10 point ROS reviewed and negative  HISTORY OF CURRENT ILLNESS:  Oncology History  Ductal carcinoma in situ (DCIS) of right breast  07/24/2018 Initial Diagnosis   [place] woman status post right breast biopsy for ductal carcinoma in situ, grade 1, strongly estrogen and progesterone receptor positive.   08/27/2018 Genetic  Testing   Negative genetic testing on the STAT panel and the common hereditary cancer gene panel.  The Hereditary Gene Panel offered by [facility] includes sequencing and/or deletion duplication testing of the following 47 genes: APC, ATM, AXIN2, BARD1, BMPR1A, BRCA1, BRCA2, BRIP1, CDH1, CDK4, CDKN2A (p14ARF), CDKN2A (p16INK4a), CHEK2, CTNNA1, DICER1, EPCAM (Deletion/duplication testing only), GREM1 (promoter region deletion/duplication testing only), KIT, MEN1, MLH1, MSH2, MSH3, MSH6, MUTYH, NBN, NF1, NHTL1, PALB2, PDGFRA, PMS2, POLD1, POLE, PTEN, RAD50, RAD51C, RAD51D, SDHB, SDHC, SDHD, SMAD4, SMARCA4. STK11, TP53, TSC1, TSC2, and VHL.  The following genes were evaluated for sequence changes only: SDHA and HOXB13 c.251G>A variant only. The report date is 08/27/2018.    09/01/2018 Surgery   status post right lumpectomy 09/01/2018 for ductal carcinoma in situ, 5 mm, with negative margins   09/20/2018 Cancer Staging   Staging form: Breast, AJCC 8th Edition - Pathologic: Stage 0 (pTis (DCIS), pN0, cM0, ER+, PR+) - Signed by Gardenia Phlegm, NP on 09/20/2018    10/16/2018 - 11/10/2018 Radiation Therapy    (a) Right Breast was treated to 42.56 Gy in 16 fractions, followed by a boost of 8 Gy in 4 fractions.   05/2019 -  Anti-estrogen oral therapy   Anastrozole daily (delayed start due to having to have knee surgery)     PAST MEDICAL HISTORY: Past Medical History:  Diagnosis Date   Arthritis    Breast cancer, right breast ([place])    pending right breast lumpectomy on September 01, 2018   Diabetes mellitus type II, controlled ([place])    On Kombiglyze   Family history of ovarian cancer    Family history of thyroid cancer    GERD (gastroesophageal reflux disease)    rarely occurs- no meds   Hyperlipidemia associated with type 2 diabetes mellitus ([place])  Hypertension    Hypothyroidism    Personal history of radiation therapy    Primary localized osteoarthritis of right knee 04/17/2019   S/P knee  replacement 04/17/2019   Trigeminal neuralgia of left side of face 2015   s/p Gamma knife  She has a stable lung nodule   PAST SURGICAL HISTORY: Past Surgical History:  Procedure Laterality Date   BREAST BIOPSY Right 07/24/2018   BREAST LUMPECTOMY Right 09/01/2018   BREAST LUMPECTOMY WITH RADIOACTIVE SEED LOCALIZATION Right 09/01/2018   Procedure: RIGHT BREAST LUMPECTOMY WITH RADIOACTIVE SEED LOCALIZATION;  Surgeon: [name], MD;  Location: [facility];  Service: General;  Laterality: Right;   COLONOSCOPY     Gamma Knife Left 04/08/2016   HYSTEROSCOPY WITH D & C  05/24/2011   Procedure: DILATATION AND CURETTAGE (D&C) /HYSTEROSCOPY;  Surgeon: [name], MD;  Location: [facility];  Service: Gynecology;  Laterality: N/A;   KNEE SURGERY Right 06/10/15   torn ligament   PARTIAL KNEE ARTHROPLASTY Right 04/17/2019   Procedure: UNICOMPARTMENTAL KNEE;  Surgeon: [name], MD;  Location: [facility];  Service: Orthopedics;  Laterality: Right;   THYROIDECTOMY N/A 09/08/2016   Procedure: TOTAL THYROIDECTOMY;  Surgeon: [name], MD;  Location: [facility];  Service: ENT;  Laterality: N/A;   TUBAL LIGATION     uterine polyp     WISDOM TOOTH EXTRACTION Left 2014    FAMILY HISTORY Family History  Problem Relation Age of Onset   Hypertension Mother    Kidney failure Mother    Congestive Heart Failure Mother    Heart Problems Father    Stroke Father    Pneumonia Father    Heart Problems Sister 61       heart transplant   Stroke Paternal Grandmother    Dementia Paternal Grandmother    Stroke Paternal Grandfather    Cardiomyopathy Sister    Ovarian cancer Sister 43       d. 17   Thyroid disease Sister    Thyroid disease Sister    Thyroid cancer Niece 80   Breast cancer Neg Hx   (as of 08/02/2018) Patient father was 30 years old when he died from pneumonia and a stroke. Patient mother died from possible heart attack at age 54.  The patient denies a family hx of breast cancer. A  sister was diagnosed with ovarian cancer, and has since passed away at age 73.  The patient has 5 siblings, 5 sisters and 0 brothers.   GYNECOLOGIC HISTORY:  No LMP recorded. Patient is postmenopausal. Menarche: 71 years old Age at first live birth: 71 years old [place] P 4 LMP 1996 Contraceptive yes HRT no  Hysterectomy? Yes, 5-6 years ago (per patient) BSO? 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 Husband [name] is a retired English as a second language teacher. [name], age 18, is a NP for an opioid clinic in [place]. Son [name], age 38, is a Nature conservation officer in [place]. Daughter [name], age 32, works as a Software engineer for [employer]. Son [name], age 67, works in Land in [place].  The patient has no grandchildren however she has a foster child who is currently 56.  The patient belongs to [place] DIRECTIVES: Husband [name] is her HCPOA.   HEALTH MAINTENANCE: Social History   Tobacco Use   Smoking status: Never   Smokeless tobacco: Never  Vaping Use   Vaping Use: Never used  Substance Use Topics  Alcohol use: No   Drug use: No     Colonoscopy: 2019, Dr. Collene Mares  PAP: 03/21/2011, normal  Bone density: Pending   Allergies  Allergen Reactions   Cyclobenzaprine Nausea Only    Current Outpatient Medications  Medication Sig Dispense Refill   Accu-Chek FastClix Lancets MISC Use as instructed to check blood sugars 1 time per day dx: e11.22 150 each 2   anastrozole (ARIMIDEX) 1 MG tablet Take 1 tablet (1 mg total) by mouth daily. Start Apr 29, 2019 90 tablet 4   baclofen (LIORESAL) 10 MG tablet TAKE 1 TABLET BY MOUTH THREE TIMES A DAY AS NEEDED FOR MUSCLE SPASMS 270 tablet 0   cetirizine (ZYRTEC) 10 MG tablet TAKE 1 TABLET BY MOUTH EVERY DAY 30 tablet 0   Cholecalciferol (VITAMIN D) 125 MCG (5000 UT) CAPS Take 5,000 Units by mouth daily.     COMBIGAN 0.2-0.5 % ophthalmic solution Place 1 drop into both eyes 2 (two) times daily.      dapagliflozin propanediol (FARXIGA) 10 MG TABS tablet Take 1 tablet (10 mg total) by mouth daily before breakfast. 90 tablet 1   DULoxetine (CYMBALTA) 60 MG capsule TAKE 1 CAPSULE BY MOUTH EVERY DAY 90 capsule 1   famotidine (PEPCID) 20 MG tablet TAKE 1 TABLET BY MOUTH TWICE A DAY 60 tablet 1   gabapentin (NEURONTIN) 300 MG capsule Take 3 capsules (900 mg total) by mouth 3 (three) times daily. 810 capsule 3   hydrOXYzine (ATARAX) 10 MG tablet Take 1 tablet (10 mg total) by mouth 3 (three) times daily as needed. 30 tablet 0   KOMBIGLYZE XR 5-500 MG TB24 TAKE 1 TABLET BY MOUTH DAILY BEFORE SUPPER. 90 tablet 1   latanoprost (XALATAN) 0.005 % ophthalmic solution Place 1 drop into both eyes at bedtime.     levothyroxine (SYNTHROID) 50 MCG tablet TAKE 1 TABLET BY MOUTH MONDAY - FRIDAY ONLY 90 tablet 1   levothyroxine (SYNTHROID) 75 MCG tablet Take 1 tablet by mouth on Sat - Sunday only 90 tablet 1   losartan-hydrochlorothiazide (HYZAAR) 50-12.5 MG tablet TAKE 1 TABLET BY MOUTH EVERY DAY 90 tablet 1   pravastatin (PRAVACHOL) 80 MG tablet TAKE ONE TAB BY MOUTH M-F, SKIP SAT/SUN 75 tablet 2   No current facility-administered medications for this visit.    OBJECTIVE: African-American woman in no acute distress  There were no vitals filed for this visit.   Wt Readings from Last 3 Encounters:  11/05/21 171 lb (77.6 kg)  09/02/21 174 lb 12.8 oz (79.3 kg)  08/31/21 174 lb 12.8 oz (79.3 kg)   There is no height or weight on file to calculate BMI.     Physical Exam Constitutional:      Appearance: Normal appearance.  Cardiovascular:     Rate and Rhythm: Normal rate and regular rhythm.     Pulses: Normal pulses.     Heart sounds: Normal heart sounds.  Chest:     Comments: Bilateral breast examined.  No palpable masses except for surgical changes.  No regional adenopathy Musculoskeletal:     Cervical back: Normal range of motion and neck supple. No rigidity.  Lymphadenopathy:     Cervical: No  cervical adenopathy.  Neurological:     Mental Status: She is alert.    LAB RESULTS:  CMP     Component Value Date/Time   NA 143 11/05/2021 1706   K 3.9 11/05/2021 1706   CL 101 11/05/2021 1706   CO2 23 11/05/2021 1706  GLUCOSE 87 11/05/2021 1706   GLUCOSE 138 (H) 04/18/2019 0300   BUN 13 11/05/2021 1706   CREATININE 0.76 11/05/2021 1706   CREATININE 0.77 08/02/2018 0835   CALCIUM 7.8 (L) 11/05/2021 1706   PROT 6.9 11/05/2021 1706   ALBUMIN 4.2 11/05/2021 1706   AST 18 11/05/2021 1706   AST 18 08/02/2018 0835   ALT 22 11/05/2021 1706   ALT 22 08/02/2018 0835   ALKPHOS 107 11/05/2021 1706   BILITOT 0.7 11/05/2021 1706   BILITOT 0.7 08/02/2018 0835   GFRNONAA 92 06/19/2020 1556   GFRNONAA >60 08/02/2018 0835   GFRAA 105 06/19/2020 1556   GFRAA >60 08/02/2018 0835    No results found for: TOTALPROTELP, ALBUMINELP, A1GS, A2GS, BETS, BETA2SER, GAMS, MSPIKE, SPEI  No results found for: KPAFRELGTCHN, LAMBDASER, [facility]  Lab Results  Component Value Date   WBC 4.2 08/31/2021   NEUTROABS 3.1 08/31/2021   HGB 11.8 08/31/2021   HCT 35.7 08/31/2021   MCV 82 08/31/2021   PLT 213 08/31/2021   No results found for: LABCA2  No components found for: GLOVFI433  No results for input(s): INR in the last 168 hours.  No results found for: LABCA2  No results found for: IRJ188  No results found for: CZY606  No results found for: TKZ601  No results found for: CA2729  No components found for: HGQUANT  No results found for: CEA1 / No results found for: CEA1   No results found for: AFPTUMOR  No results found for: CHROMOGRNA  No results found for: HGBA, HGBA2QUANT, HGBFQUANT, HGBSQUAN (Hemoglobinopathy evaluation)   No results found for: LDH  No results found for: IRON, TIBC, IRONPCTSAT (Iron and TIBC)  No results found for: FERRITIN  Urinalysis    Component Value Date/Time   COLORURINE YELLOW 08/17/2007 1404   APPEARANCEUR CLEAR 08/17/2007 1404    LABSPEC 1.010 08/17/2007 1404   PHURINE 7.0 08/17/2007 1404   GLUCOSEU NEGATIVE 08/17/2007 1404   HGBUR NEGATIVE 08/17/2007 1404   BILIRUBINUR negative 03/12/2021 1242   KETONESUR negative 04/12/2016 1147   KETONESUR NEGATIVE 08/17/2007 1404   PROTEINUR Negative 03/12/2021 [place] 08/17/2007 1404   UROBILINOGEN 0.2 03/12/2021 1242   UROBILINOGEN 0.2 08/17/2007 1404   NITRITE negative 03/12/2021 1242   NITRITE NEGATIVE 08/17/2007 1404   LEUKOCYTESUR Negative 03/12/2021 1242    STUDIES: No results found.   ELIGIBLE FOR AVAILABLE RESEARCH PROTOCOL: no  ASSESSMENT: 71 y.o. [place] woman status post right breast biopsy 07/24/2018 for ductal carcinoma in situ, grade 1, strongly estrogen and progesterone receptor positive.  (1) status post right lumpectomy 09/01/2018 for ductal carcinoma in situ, 5 mm, with negative margins  (2) adjuvant radiation 10/16/2018 - 11/10/2018  (a) Right Breast was treated to 42.56 Gy in 16 fractions, followed by a boost of 8 Gy in 4 fractions.  (3) started anastrozole 04/29/2019  (a) bone density 10/18/2020 showed a T score of 0.3 (normal).  (4) genetics testing 08/27/2018 on the STAT panel and the common hereditary cancer gene paneloffered by [facility] found no deleterious mutations in APC, ATM, AXIN2, BARD1, BMPR1A, BRCA1, BRCA2, BRIP1, CDH1, CDK4, CDKN2A (p14ARF), CDKN2A (p16INK4a), CHEK2, CTNNA1, DICER1, EPCAM (Deletion/duplication testing only), GREM1 (promoter region deletion/duplication testing only), KIT, MEN1, MLH1, MSH2, MSH3, MSH6, MUTYH, NBN, NF1, NHTL1, PALB2, PDGFRA, PMS2, POLD1, POLE, PTEN, RAD50, RAD51C, RAD51D, SDHB, SDHC, SDHD, SMAD4, SMARCA4. STK11, TP53, TSC1, TSC2, and VHL.  The following genes were evaluated for sequence changes only: SDHA and HOXB13 c.251G>A variant only.  PLAN: Carlus Pavlov is now a little over 2 years out from definitive surgery for her breast cancer with no evidence of disease recurrence.  This is very  favorable. She is tolerating anastrozole well and the plan is to continue that a total of 5 years. Physical examination with some lumpiness around the surgical scar which is likely postop changes.  Most recent mammogram and ultrasound without any definitive evidence of recurrence. She will continue diagnostic mammograms annually, this has been ordered.  She was instructed to keep [place] posted with any new changes. Return to clinic in 1 year. Repeat bone density in April 2024.  Encouraged to stay active and take calcium and vitamin D supplementation as tolerated  Total time spent: 30 minutes *Total Encounter Time as defined by the Centers for Medicare and Medicaid Services includes, in addition to the face-to-face time of a patient visit (documented in the note above) non-face-to-face time: obtaining and reviewing outside history, ordering and reviewing medications, tests or procedures, care coordination (communications with other health care professionals or caregivers) and documentation in the medical record.

## 2021-12-05 ENCOUNTER — Other Ambulatory Visit: Payer: Self-pay | Admitting: Internal Medicine

## 2021-12-05 DIAGNOSIS — E890 Postprocedural hypothyroidism: Secondary | ICD-10-CM

## 2021-12-14 DIAGNOSIS — H2512 Age-related nuclear cataract, left eye: Secondary | ICD-10-CM | POA: Diagnosis not present

## 2021-12-14 DIAGNOSIS — H401133 Primary open-angle glaucoma, bilateral, severe stage: Secondary | ICD-10-CM | POA: Diagnosis not present

## 2021-12-14 DIAGNOSIS — H409 Unspecified glaucoma: Secondary | ICD-10-CM | POA: Diagnosis not present

## 2021-12-14 DIAGNOSIS — H2513 Age-related nuclear cataract, bilateral: Secondary | ICD-10-CM | POA: Diagnosis not present

## 2021-12-14 DIAGNOSIS — H269 Unspecified cataract: Secondary | ICD-10-CM | POA: Diagnosis not present

## 2021-12-14 DIAGNOSIS — H401123 Primary open-angle glaucoma, left eye, severe stage: Secondary | ICD-10-CM | POA: Diagnosis not present

## 2021-12-22 ENCOUNTER — Telehealth: Payer: Self-pay

## 2022-01-04 DIAGNOSIS — L820 Inflamed seborrheic keratosis: Secondary | ICD-10-CM | POA: Diagnosis not present

## 2022-01-12 DIAGNOSIS — I10 Essential (primary) hypertension: Secondary | ICD-10-CM | POA: Diagnosis not present

## 2022-01-12 DIAGNOSIS — E669 Obesity, unspecified: Secondary | ICD-10-CM | POA: Diagnosis not present

## 2022-01-12 DIAGNOSIS — E785 Hyperlipidemia, unspecified: Secondary | ICD-10-CM | POA: Diagnosis not present

## 2022-01-12 DIAGNOSIS — H409 Unspecified glaucoma: Secondary | ICD-10-CM | POA: Diagnosis not present

## 2022-01-12 DIAGNOSIS — E119 Type 2 diabetes mellitus without complications: Secondary | ICD-10-CM | POA: Diagnosis not present

## 2022-01-12 DIAGNOSIS — G500 Trigeminal neuralgia: Secondary | ICD-10-CM | POA: Diagnosis not present

## 2022-01-12 DIAGNOSIS — C50919 Malignant neoplasm of unspecified site of unspecified female breast: Secondary | ICD-10-CM | POA: Diagnosis not present

## 2022-01-12 DIAGNOSIS — K219 Gastro-esophageal reflux disease without esophagitis: Secondary | ICD-10-CM | POA: Diagnosis not present

## 2022-01-12 DIAGNOSIS — E890 Postprocedural hypothyroidism: Secondary | ICD-10-CM | POA: Diagnosis not present

## 2022-01-19 ENCOUNTER — Other Ambulatory Visit: Payer: Self-pay

## 2022-01-19 MED ORDER — METFORMIN HCL ER 750 MG PO TB24: 750.0000 mg | ORAL_TABLET | Freq: Every day | ORAL | 1 refills | Status: DC

## 2022-02-22 ENCOUNTER — Ambulatory Visit: Payer: Medicare PPO | Admitting: Internal Medicine

## 2022-02-22 ENCOUNTER — Encounter: Payer: Self-pay | Admitting: Internal Medicine

## 2022-02-22 VITALS — BP 122/78 | HR 91 | Temp 98.1°F | Ht 64.0 in | Wt 178.6 lb

## 2022-02-22 DIAGNOSIS — E6609 Other obesity due to excess calories: Secondary | ICD-10-CM | POA: Diagnosis not present

## 2022-02-22 DIAGNOSIS — Z6830 Body mass index (BMI) 30.0-30.9, adult: Secondary | ICD-10-CM

## 2022-02-22 DIAGNOSIS — E785 Hyperlipidemia, unspecified: Secondary | ICD-10-CM | POA: Diagnosis not present

## 2022-02-22 DIAGNOSIS — E1169 Type 2 diabetes mellitus with other specified complication: Secondary | ICD-10-CM | POA: Diagnosis not present

## 2022-02-22 DIAGNOSIS — I119 Hypertensive heart disease without heart failure: Secondary | ICD-10-CM

## 2022-02-22 DIAGNOSIS — Z23 Encounter for immunization: Secondary | ICD-10-CM | POA: Diagnosis not present

## 2022-02-22 DIAGNOSIS — I700 Atherosclerosis of aorta: Secondary | ICD-10-CM | POA: Diagnosis not present

## 2022-02-22 DIAGNOSIS — E1165 Type 2 diabetes mellitus with hyperglycemia: Secondary | ICD-10-CM | POA: Diagnosis not present

## 2022-02-22 DIAGNOSIS — E890 Postprocedural hypothyroidism: Secondary | ICD-10-CM | POA: Diagnosis not present

## 2022-02-22 DIAGNOSIS — F5101 Primary insomnia: Secondary | ICD-10-CM

## 2022-02-22 DIAGNOSIS — C50011 Malignant neoplasm of nipple and areola, right female breast: Secondary | ICD-10-CM

## 2022-02-22 LAB — POCT URINALYSIS DIPSTICK
Bilirubin, UA: NEGATIVE
Blood, UA: NEGATIVE
Glucose, UA: POSITIVE — AB
Ketones, UA: NEGATIVE
Leukocytes, UA: NEGATIVE
Nitrite, UA: NEGATIVE
Protein, UA: NEGATIVE
Spec Grav, UA: 1.02 (ref 1.010–1.025)
Urobilinogen, UA: 0.2 E.U./dL
pH, UA: 7 (ref 5.0–8.0)

## 2022-02-22 NOTE — Progress Notes (Signed)
I,Christina Hernandez,acting as a scribe for Christina Greenland, MD.,have documented all relevant documentation on the behalf of Christina Greenland, MD,as directed by  Christina Greenland, MD while in the presence of Christina Greenland, MD.    Subjective:     Patient ID: Christina Hernandez , female    DOB: [birth year] , 71 y.o.   MRN: [id number]   Chief Complaint  Patient presents with   Diabetes   Hypertension    HPI  Patient presents today for a bp and diabetes f/u. She reports compliance with meds. She denies headaches, chest pain and shortness of breath.   Diabetes She presents for her follow-up diabetic visit. She has type 2 diabetes mellitus. There are no hypoglycemic associated symptoms. There are no diabetic associated symptoms. Pertinent negatives for diabetes include no blurred vision and no chest pain. There are no hypoglycemic complications. Risk factors for coronary artery disease include diabetes mellitus, dyslipidemia, hypertension, sedentary lifestyle and post-menopausal. She participates in exercise intermittently. An ACE inhibitor/angiotensin II receptor blocker is being taken. Eye exam is current.  Hypertension This is a chronic problem. The current episode started more than 1 year ago. The problem has been gradually improving since onset. The problem is controlled. Pertinent negatives include no blurred vision, chest pain, palpitations or shortness of breath. Risk factors for coronary artery disease include diabetes mellitus, dyslipidemia, post-menopausal state and sedentary lifestyle. The current treatment provides moderate improvement. Compliance problems include exercise.      Past Medical History:  Diagnosis Date   Arthritis    Breast cancer, right breast ([place])    pending right breast lumpectomy on September 01, 2018   Diabetes mellitus type II, controlled ([place])    On Kombiglyze   Family history of ovarian cancer    Family history of thyroid cancer    GERD (gastroesophageal  reflux disease)    rarely occurs- no meds   Hyperlipidemia associated with type 2 diabetes mellitus ([place])    Hypertension    Hypothyroidism    Personal history of radiation therapy    Primary localized osteoarthritis of right knee 04/17/2019   S/P knee replacement 04/17/2019   Trigeminal neuralgia of left side of face 2015   s/p Gamma knife     Family History  Problem Relation Age of Onset   Hypertension Mother    Kidney failure Mother    Congestive Heart Failure Mother    Heart Problems Father    Stroke Father    Pneumonia Father    Heart Problems Sister 92       heart transplant   Stroke Paternal Grandmother    Dementia Paternal Grandmother    Stroke Paternal Grandfather    Cardiomyopathy Sister    Ovarian cancer Sister 27       d. 26   Thyroid disease Sister    Thyroid disease Sister    Thyroid cancer Niece 13   Breast cancer Neg Hx      Current Outpatient Medications:    Accu-Chek FastClix Lancets MISC, Use as instructed to check blood sugars 1 time per day dx: e11.22, Disp: 150 each, Rfl: 2   anastrozole (ARIMIDEX) 1 MG tablet, Take 1 tablet (1 mg total) by mouth daily. Start Apr 29, 2019, Disp: 90 tablet, Rfl: 4   baclofen (LIORESAL) 10 MG tablet, TAKE 1 TABLET BY MOUTH THREE TIMES A DAY AS NEEDED FOR MUSCLE SPASMS, Disp: 270 tablet, Rfl: 0   cetirizine (ZYRTEC) 10 MG tablet, TAKE 1 TABLET  BY MOUTH EVERY DAY, Disp: 30 tablet, Rfl: 0   Cholecalciferol (VITAMIN D) 125 MCG (5000 UT) CAPS, Take 5,000 Units by mouth daily., Disp: , Rfl:    COMBIGAN 0.2-0.5 % ophthalmic solution, Place 1 drop into both eyes 2 (two) times daily., Disp: , Rfl:    dapagliflozin propanediol (FARXIGA) 10 MG TABS tablet, Take 1 tablet (10 mg total) by mouth daily before breakfast., Disp: 90 tablet, Rfl: 1   DULoxetine (CYMBALTA) 60 MG capsule, TAKE 1 CAPSULE BY MOUTH EVERY DAY, Disp: 90 capsule, Rfl: 1   famotidine (PEPCID) 20 MG tablet, TAKE 1 TABLET BY MOUTH TWICE A DAY, Disp: 60 tablet, Rfl:  1   gabapentin (NEURONTIN) 300 MG capsule, Take 3 capsules (900 mg total) by mouth 3 (three) times daily., Disp: 810 capsule, Rfl: 3   hydrOXYzine (ATARAX) 10 MG tablet, Take 1 tablet (10 mg total) by mouth 3 (three) times daily as needed., Disp: 30 tablet, Rfl: 0   latanoprost (XALATAN) 0.005 % ophthalmic solution, Place 1 drop into both eyes at bedtime., Disp: , Rfl:    levothyroxine (SYNTHROID) 50 MCG tablet, TAKE 1 TABLET BY MOUTH MONDAY - FRIDAY ONLY, Disp: 90 tablet, Rfl: 1   levothyroxine (SYNTHROID) 75 MCG tablet, TAKE 1 TABLET BY MOUTH ON SAT - SUNDAY ONLY, Disp: 90 tablet, Rfl: 1   losartan-hydrochlorothiazide (HYZAAR) 50-12.5 MG tablet, TAKE 1 TABLET BY MOUTH EVERY DAY, Disp: 90 tablet, Rfl: 1   pravastatin (PRAVACHOL) 80 MG tablet, TAKE ONE TAB BY MOUTH M-F, SKIP SAT/SUN, Disp: 75 tablet, Rfl: 2   metFORMIN (GLUCOPHAGE-XR) 750 MG 24 hr tablet, Take 1 tablet (750 mg total) by mouth daily. (Patient not taking: Reported on 02/22/2022), Disp: 90 tablet, Rfl: 1   Allergies  Allergen Reactions   Cyclobenzaprine Nausea Only     Review of Systems  Constitutional: Negative.   Eyes:  Negative for blurred vision.  Respiratory: Negative.  Negative for shortness of breath.   Cardiovascular: Negative.  Negative for chest pain and palpitations.  Musculoskeletal:  Positive for arthralgias.       She c/o r hip pain. Denies fall/trauma.  Hurts to lay on right side. Not sure what may have triggered her sx.   Neurological: Negative.   Psychiatric/Behavioral: Negative.       Today's Vitals   02/22/22 1432  BP: 122/78  Pulse: 91  Temp: 98.1 F (36.7 C)  SpO2: 98%  Weight: 178 lb 9.6 oz (81 kg)  Height: 5' 4" (1.626 m)  PainSc: 0-No pain   Body mass index is 30.66 kg/m.  Wt Readings from Last 3 Encounters:  02/22/22 178 lb 9.6 oz (81 kg)  11/20/21 174 lb (78.9 kg)  11/05/21 171 lb (77.6 kg)    Objective:  Physical Exam Vitals and nursing note reviewed.  Constitutional:       Appearance: Normal appearance.  HENT:     Head: Normocephalic and atraumatic.  Eyes:     Extraocular Movements: Extraocular movements intact.  Cardiovascular:     Rate and Rhythm: Normal rate and regular rhythm.     Heart sounds: Normal heart sounds.  Pulmonary:     Effort: Pulmonary effort is normal.     Breath sounds: Normal breath sounds.  Musculoskeletal:     Cervical back: Normal range of motion.  Skin:    General: Skin is warm.  Neurological:     General: No focal deficit present.     Mental Status: She is alert.  Psychiatric:  Mood and Affect: Mood normal.        Behavior: Behavior normal.       Assessment And Plan:     1. Dyslipidemia associated with type 2 diabetes mellitus ([place]) Comments: Chronic, LDL goal <70. She will c/w pravastatin 52m daily. I will check labs as below. She will rto in 4 months for re-evaluation.  - POCT Urinalysis Dipstick (81002) - BMP8+EGFR - Hemoglobin A1c - Microalbumin / Creatinine Urine Ratio  2. Hypertensive heart disease without heart failure Comments: Chronic, well controlled. She will c/w losartan/hct 50/12.564mdaily. She is encouraged to follow low sodium diet.  3. Atherosclerosis of aorta (HCC) Comments: Chronic, LDL goal <70. She will c/w pravastatin 8099maily. Need to add ASA 36m109mily.   4. Postoperative hypothyroidism Comments: Thyroid labs normal May 2023, no need to recheck today.  She will c/w levothyroxine 50mg39m and 75mcg51mSat/Sundays.   5. Malignant neoplasm of nipple of right breast in female, unspecified estrogen receptor status (H[facility]) [place]: She is s/p lumpectomy R breast 08/2018, adjuvant radiotherapy and currently on anastrozole daily. Oncology input is appreciated.   6. Class 1 obesity due to excess calories without serious comorbidity with body mass index (BMI) of 30.0 to 30.9 in adult Comments: She is encouraged to strive for BMI less than 30 to decrease cardiac risk. Advised to aim for at least  150 minutes of exercise per week.   7. Immunization due Comments: She was given Shingrix, billed via [facility].  - Zoster Recombinant (Shingrix )  Patient was given opportunity to ask questions. Patient verbalized understanding of the plan and was able to repeat key elements of the plan. All questions were answered to their satisfaction.   I, Marolyn Urschel Christina Greenlandhave reviewed all documentation for this visit. The documentation on 02/22/22 for the exam, diagnosis, procedures, and orders are all accurate and complete.   IF YOU HAVE BEEN REFERRED TO A SPECIALIST, IT MAY TAKE 1-2 WEEKS TO SCHEDULE/PROCESS THE REFERRAL. IF YOU HAVE NOT HEARD FROM US/SPECIALIST IN TWO WEEKS, PLEASE GIVE US A [place] AT [phone] X [phone].   THE PATIENT IS ENCOURAGED TO PRACTICE SOCIAL DISTANCING DUE TO THE COVID-19 PANDEMIC.

## 2022-02-22 NOTE — Patient Instructions (Signed)

## 2022-02-23 ENCOUNTER — Other Ambulatory Visit: Payer: Self-pay

## 2022-02-23 DIAGNOSIS — E1165 Type 2 diabetes mellitus with hyperglycemia: Secondary | ICD-10-CM | POA: Diagnosis not present

## 2022-02-23 LAB — BMP8+EGFR
BUN/Creatinine Ratio: 15 (ref 12–28)
BUN: 11 mg/dL (ref 8–27)
CO2: 24 mmol/L (ref 20–29)
Calcium: 10.1 mg/dL (ref 8.7–10.3)
Chloride: 102 mmol/L (ref 96–106)
Creatinine, Ser: 0.71 mg/dL (ref 0.57–1.00)
Glucose: 98 mg/dL (ref 70–99)
Potassium: 4.4 mmol/L (ref 3.5–5.2)
Sodium: 143 mmol/L (ref 134–144)
eGFR: 90 mL/min/{1.73_m2} (ref >59)

## 2022-02-23 LAB — HEMOGLOBIN A1C
Est. average glucose Bld gHb Est-mCnc: 137 mg/dL
Hgb A1c MFr Bld: 6.4 % — ABNORMAL HIGH (ref 4.8–5.6)

## 2022-02-23 MED ORDER — DULOXETINE HCL 60 MG PO CPEP: ORAL_CAPSULE | ORAL | 1 refills | Status: DC

## 2022-02-23 MED ORDER — LEVOTHYROXINE SODIUM 50 MCG PO TABS: ORAL_TABLET | ORAL | 1 refills | Status: DC

## 2022-02-25 LAB — MICROALBUMIN / CREATININE URINE RATIO
Creatinine, Urine: 91.5 mg/dL
Microalb/Creat Ratio: 8 mg/g creat (ref 0–29)
Microalbumin, Urine: 6.9 ug/mL

## 2022-03-20 ENCOUNTER — Other Ambulatory Visit: Payer: Self-pay | Admitting: Nurse Practitioner

## 2022-03-22 ENCOUNTER — Other Ambulatory Visit: Payer: Self-pay | Admitting: Diagnostic Neuroimaging

## 2022-03-22 ENCOUNTER — Other Ambulatory Visit: Payer: Self-pay | Admitting: Internal Medicine

## 2022-03-22 MED ORDER — GABAPENTIN 300 MG PO CAPS
900.0000 mg | ORAL_CAPSULE | Freq: Three times a day (TID) | ORAL | 3 refills | Status: DC
Start: 2022-03-22 — End: 2022-05-11

## 2022-03-23 ENCOUNTER — Ambulatory Visit (INDEPENDENT_AMBULATORY_CARE_PROVIDER_SITE_OTHER): Payer: Medicare PPO

## 2022-03-23 VITALS — BP 118/64 | HR 97 | Temp 98.2°F

## 2022-03-23 DIAGNOSIS — Z23 Encounter for immunization: Secondary | ICD-10-CM | POA: Diagnosis not present

## 2022-03-23 NOTE — Patient Instructions (Signed)
Tdap (Tetanus, Diphtheria, Pertussis) Vaccine: What You Need to Know 1. Why get vaccinated? Tdap vaccine can prevent tetanus, diphtheria, and pertussis. Diphtheria and pertussis spread from person to person. Tetanus enters the body through cuts or wounds. TETANUS (T) causes painful stiffening of the muscles. Tetanus can lead to serious health problems, including being unable to open the mouth, having trouble swallowing and breathing, or death. DIPHTHERIA (D) can lead to difficulty breathing, heart failure, paralysis, or death. PERTUSSIS (aP), also known as "whooping cough," can cause uncontrollable, violent coughing that makes it hard to breathe, eat, or drink. Pertussis can be extremely serious especially in babies and young children, causing pneumonia, convulsions, brain damage, or death. In teens and adults, it can cause weight loss, loss of bladder control, passing out, and rib fractures from severe coughing. 2. Tdap vaccine Tdap is only for children 7 years and older, adolescents, and adults.  Adolescents should receive a single dose of Tdap, preferably at age 10 or 71 years. Pregnant people should get a dose of Tdap during every pregnancy, preferably during the early part of the third trimester, to help protect the newborn from pertussis. Infants are most at risk for severe, life-threatening complications from pertussis. Adults who have never received Tdap should get a dose of Tdap. Also, adults should receive a booster dose of either Tdap or Td (a different vaccine that protects against tetanus and diphtheria but not pertussis) every 10 years, or after 5 years in the case of a severe or dirty wound or burn. Tdap may be given at the same time as other vaccines. 3. Talk with your health care provider Tell your vaccine provider if the person getting the vaccine: Has had an allergic reaction after a previous dose of any vaccine that protects against tetanus, diphtheria, or pertussis, or has any  severe, life-threatening allergies Has had a coma, decreased level of consciousness, or prolonged seizures within 7 days after a previous dose of any pertussis vaccine (DTP, DTaP, or Tdap) Has seizures or another nervous system problem Has ever had Guillain-Barr Syndrome (also called "GBS") Has had severe pain or swelling after a previous dose of any vaccine that protects against tetanus or diphtheria In some cases, your health care provider may decide to postpone Tdap vaccination until a future visit. People with minor illnesses, such as a cold, may be vaccinated. People who are moderately or severely ill should usually wait until they recover before getting Tdap vaccine.  Your health care provider can give you more information. 4. Risks of a vaccine reaction Pain, redness, or swelling where the shot was given, mild fever, headache, feeling tired, and nausea, vomiting, diarrhea, or stomachache sometimes happen after Tdap vaccination. People sometimes faint after medical procedures, including vaccination. Tell your provider if you feel dizzy or have vision changes or ringing in the ears.  As with any medicine, there is a very remote chance of a vaccine causing a severe allergic reaction, other serious injury, or death. 5. What if there is a serious problem? An allergic reaction could occur after the vaccinated person leaves the clinic. If you see signs of a severe allergic reaction (hives, swelling of the face and throat, difficulty breathing, a fast heartbeat, dizziness, or weakness), call 9-1-1 and get the person to the nearest hospital. For other signs that concern you, call your health care provider.  Adverse reactions should be reported to the Vaccine Adverse Event Reporting System (VAERS). Your health care provider will usually file this report, or you  can do it yourself. Visit the VAERS website at www.vaers.SamedayNews.es or call (587) 309-5883. VAERS is only for reporting reactions, and VAERS staff  members do not give medical advice. 6. The National Vaccine Injury Compensation Program The [employer] Vaccine Injury Compensation Program (VICP) is a federal program that was created to compensate people who may have been injured by certain vaccines. Claims regarding alleged injury or death due to vaccination have a time limit for filing, which may be as short as two years. Visit the VICP website at GoldCloset.com.ee or call 210-323-3392 to learn about the program and about filing a claim. 7. How can I learn more? Ask your health care provider. Call your local or state health department. Visit the website of the Food and Drug Administration (FDA) for vaccine package inserts and additional information at TraderRating.uy. Contact the Centers for Disease Control and Prevention (CDC): Call (872)059-6335 (1-800-CDC-INFO) or Visit CDC's website at http://hunter.com/. Source: CDC Vaccine Information Statement Tdap (Tetanus, Diphtheria, Pertussis) Vaccine (02/01/2020) This same material is available at http://www.wolf.info/ for no charge. This information is not intended to replace advice given to you by your health care provider. Make sure you discuss any questions you have with your health care provider. Document Revised: 05/13/2021 Document Reviewed: 03/16/2021 Elsevier Patient Education  [place]. Influenza Virus Vaccine injection What is this medication? INFLUENZA VIRUS VACCINE (in floo EN zuh VAHY ruhs vak SEEN) helps to reduce the risk of getting influenza also known as the flu. The vaccine only helps protect you against some strains of the flu. This medicine may be used for other purposes; ask your health care provider or pharmacist if you have questions. COMMON BRAND NAME(S): Afluria, Afluria Quadrivalent, Agriflu, Alfuria, FLUAD, FLUAD Quadrivalent, Fluarix, Fluarix Quadrivalent, Flublok, Flublok Quadrivalent, FLUCELVAX, FLUCELVAX Quadrivalent,  Flulaval, Flulaval Quadrivalent, Fluvirin, Fluzone, Fluzone High-Dose, Fluzone Intradermal, Fluzone Quadrivalent What should I tell my care team before I take this medication? They need to know if you have any of these conditions: bleeding disorder like hemophilia fever or infection Guillain-Barre syndrome or other neurological problems immune system problems infection with the human immunodeficiency virus (HIV) or AIDS low blood platelet counts multiple sclerosis an unusual or allergic reaction to influenza virus vaccine, latex, other medicines, foods, dyes, or preservatives. Different brands of vaccines contain different allergens. Some may contain latex or eggs. Talk to your doctor about your allergies to make sure that you get the right vaccine. pregnant or trying to get pregnant breast-feeding How should I use this medication? This vaccine is for injection into a muscle or under the skin. It is given by a health care professional. A copy of Vaccine Information Statements will be given before each vaccination. Read this sheet carefully each time. The sheet may change frequently. Talk to your healthcare provider to see which vaccines are right for you. Some vaccines should not be used in all age groups. Overdosage: If you think you have taken too much of this medicine contact a poison control center or emergency room at once. NOTE: This medicine is only for you. Do not share this medicine with others. What if I miss a dose? This does not apply. What may interact with this medication? chemotherapy or radiation therapy medicines that lower your immune system like etanercept, anakinra, infliximab, and adalimumab medicines that treat or prevent blood clots like warfarin phenytoin steroid medicines like prednisone or cortisone theophylline vaccines This list may not describe all possible interactions. Give your health care provider a list of all the medicines, herbs,  non-prescription  drugs, or dietary supplements you use. Also tell them if you smoke, drink alcohol, or use illegal drugs. Some items may interact with your medicine. What should I watch for while using this medication? Report any side effects that do not go away within 3 days to your doctor or health care professional. Call your health care provider if any unusual symptoms occur within 6 weeks of receiving this vaccine. You may still catch the flu, but the illness is not usually as bad. You cannot get the flu from the vaccine. The vaccine will not protect against colds or other illnesses that may cause fever. The vaccine is needed every year. What side effects may I notice from receiving this medication? Side effects that you should report to your doctor or health care professional as soon as possible: allergic reactions like skin rash, itching or hives, swelling of the face, lips, or tongue Side effects that usually do not require medical attention (report to your doctor or health care professional if they continue or are bothersome): fever headache muscle aches and pains pain, tenderness, redness, or swelling at the injection site tiredness This list may not describe all possible side effects. Call your doctor for medical advice about side effects. You may report side effects to FDA at 1-800-FDA-1088. Where should I keep my medication? The vaccine will be given by a health care professional in a clinic, pharmacy, doctor's office, or other health care setting. You will not be given vaccine doses to store at home. NOTE: This sheet is a summary. It may not cover all possible information. If you have questions about this medicine, talk to your doctor, pharmacist, or health care provider.  2023 Elsevier/Gold Standard (2021-01-16 00:00:00)

## 2022-03-23 NOTE — Progress Notes (Signed)
Patient presents today for flu shot and tdap. Tdap coverage approved with [employer].

## 2022-03-31 ENCOUNTER — Other Ambulatory Visit: Payer: Self-pay | Admitting: Internal Medicine

## 2022-04-05 ENCOUNTER — Other Ambulatory Visit: Payer: Self-pay | Admitting: *Deleted

## 2022-04-05 MED ORDER — ANASTROZOLE 1 MG PO TABS: 1.0000 mg | ORAL_TABLET | Freq: Every day | ORAL | 4 refills | Status: DC

## 2022-04-07 ENCOUNTER — Ambulatory Visit: Payer: Medicare PPO | Admitting: Internal Medicine

## 2022-04-07 ENCOUNTER — Ambulatory Visit (INDEPENDENT_AMBULATORY_CARE_PROVIDER_SITE_OTHER): Payer: Medicare PPO

## 2022-04-07 VITALS — BP 130/74 | HR 75 | Temp 98.6°F | Ht 66.0 in | Wt 169.2 lb

## 2022-04-07 DIAGNOSIS — Z0000 Encounter for general adult medical examination without abnormal findings: Secondary | ICD-10-CM | POA: Diagnosis not present

## 2022-04-07 NOTE — Patient Instructions (Signed)
Christina Hernandez , Thank you for taking time to come for your Medicare Wellness Visit. I appreciate your ongoing commitment to your health goals. Please review the following plan we discussed and let me know if I can assist you in the future.   Screening recommendations/referrals: Colonoscopy: completed 02/07/2017 Mammogram: completed 08/24/2021, due 08/25/2022 Bone Density: completed 07/29/2014 Recommended yearly ophthalmology/optometry visit for glaucoma screening and checkup Recommended yearly dental visit for hygiene and checkup  Vaccinations: Influenza vaccine: completed 03/23/2022 Pneumococcal vaccine: completed 03/17/2020 Tdap vaccine: completed 03/23/2022, due 03/23/2032 Shingles vaccine: needs second dose   Covid-19: 06/13/2021, 10/24/2020, 03/31/2020, 09/03/2019, 08/06/2019  Advanced directives: Advance directive discussed with you today. Even though you declined this today please call our office should you change your mind and we can give you the proper paperwork for you to fill out.   Conditions/risks identified: none  Next appointment: Follow up in one year for your annual wellness visit    Preventive Care 65 Years and Older, Female Preventive care refers to lifestyle choices and visits with your health care provider that can promote health and wellness. What does preventive care include? A yearly physical exam. This is also called an annual well check. Dental exams once or twice a year. Routine eye exams. Ask your health care provider how often you should have your eyes checked. Personal lifestyle choices, including: Daily care of your teeth and gums. Regular physical activity. Eating a healthy diet. Avoiding tobacco and drug use. Limiting alcohol use. Practicing safe sex. Taking low-dose aspirin every day. Taking vitamin and mineral supplements as recommended by your health care provider. What happens during an annual well check? The services and screenings done by your health  care provider during your annual well check will depend on your age, overall health, lifestyle risk factors, and family history of disease. Counseling  Your health care provider may ask you questions about your: Alcohol use. Tobacco use. Drug use. Emotional well-being. Home and relationship well-being. Sexual activity. Eating habits. History of falls. Memory and ability to understand (cognition). Work and work Statistician. Reproductive health. Screening  You may have the following tests or measurements: Height, weight, and BMI. Blood pressure. Lipid and cholesterol levels. These may be checked every 5 years, or more frequently if you are over 90 years old. Skin check. Lung cancer screening. You may have this screening every year starting at age 73 if you have a 30-pack-year history of smoking and currently smoke or have quit within the past 15 years. Fecal occult blood test (FOBT) of the stool. You may have this test every year starting at age 73. Flexible sigmoidoscopy or colonoscopy. You may have a sigmoidoscopy every 5 years or a colonoscopy every 10 years starting at age 73. Hepatitis C blood test. Hepatitis B blood test. Sexually transmitted disease (STD) testing. Diabetes screening. This is done by checking your blood sugar (glucose) after you have not eaten for a while (fasting). You may have this done every 1-3 years. Bone density scan. This is done to screen for osteoporosis. You may have this done starting at age 32. Mammogram. This may be done every 1-2 years. Talk to your health care provider about how often you should have regular mammograms. Talk with your health care provider about your test results, treatment options, and if necessary, the need for more tests. Vaccines  Your health care provider may recommend certain vaccines, such as: Influenza vaccine. This is recommended every year. Tetanus, diphtheria, and acellular pertussis (Tdap, Td) vaccine. You may  need a Td  booster every 10 years. Zoster vaccine. You may need this after age 18. Pneumococcal 13-valent conjugate (PCV13) vaccine. One dose is recommended after age 49. Pneumococcal polysaccharide (PPSV23) vaccine. One dose is recommended after age 22. Talk to your health care provider about which screenings and vaccines you need and how often you need them. This information is not intended to replace advice given to you by your health care provider. Make sure you discuss any questions you have with your health care provider. Document Released: 07/11/2015 Document Revised: 03/03/2016 Document Reviewed: 04/15/2015 Elsevier Interactive Patient Education  2017 [place] Prevention in the Home Falls can cause injuries. They can happen to people of all ages. There are many things you can do to make your home safe and to help prevent falls. What can I do on the outside of my home? Regularly fix the edges of walkways and driveways and fix any cracks. Remove anything that might make you trip as you walk through a door, such as a raised step or threshold. Trim any bushes or trees on the path to your home. Use bright outdoor lighting. Clear any walking paths of anything that might make someone trip, such as rocks or tools. Regularly check to see if handrails are loose or broken. Make sure that both sides of any steps have handrails. Any raised decks and porches should have guardrails on the edges. Have any leaves, snow, or ice cleared regularly. Use sand or salt on walking paths during winter. Clean up any spills in your garage right away. This includes oil or grease spills. What can I do in the bathroom? Use night lights. Install grab bars by the toilet and in the tub and shower. Do not use towel bars as grab bars. Use non-skid mats or decals in the tub or shower. If you need to sit down in the shower, use a plastic, non-slip stool. Keep the floor dry. Clean up any water that spills on the floor  as soon as it happens. Remove soap buildup in the tub or shower regularly. Attach bath mats securely with double-sided non-slip rug tape. Do not have throw rugs and other things on the floor that can make you trip. What can I do in the bedroom? Use night lights. Make sure that you have a light by your bed that is easy to reach. Do not use any sheets or blankets that are too big for your bed. They should not hang down onto the floor. Have a firm chair that has side arms. You can use this for support while you get dressed. Do not have throw rugs and other things on the floor that can make you trip. What can I do in the kitchen? Clean up any spills right away. Avoid walking on wet floors. Keep items that you use a lot in easy-to-reach places. If you need to reach something above you, use a strong step stool that has a grab bar. Keep electrical cords out of the way. Do not use floor polish or wax that makes floors slippery. If you must use wax, use non-skid floor wax. Do not have throw rugs and other things on the floor that can make you trip. What can I do with my stairs? Do not leave any items on the stairs. Make sure that there are handrails on both sides of the stairs and use them. Fix handrails that are broken or loose. Make sure that handrails are as long as the stairways.  Check any carpeting to make sure that it is firmly attached to the stairs. Fix any carpet that is loose or worn. Avoid having throw rugs at the top or bottom of the stairs. If you do have throw rugs, attach them to the floor with carpet tape. Make sure that you have a light switch at the top of the stairs and the bottom of the stairs. If you do not have them, ask someone to add them for you. What else can I do to help prevent falls? Wear shoes that: Do not have high heels. Have rubber bottoms. Are comfortable and fit you well. Are closed at the toe. Do not wear sandals. If you use a stepladder: Make sure that it is  fully opened. Do not climb a closed stepladder. Make sure that both sides of the stepladder are locked into place. Ask someone to hold it for you, if possible. Clearly mark and make sure that you can see: Any grab bars or handrails. First and last steps. Where the edge of each step is. Use tools that help you move around (mobility aids) if they are needed. These include: Canes. Walkers. Scooters. Crutches. Turn on the lights when you go into a dark area. Replace any light bulbs as soon as they burn out. Set up your furniture so you have a clear path. Avoid moving your furniture around. If any of your floors are uneven, fix them. If there are any pets around you, be aware of where they are. Review your medicines with your doctor. Some medicines can make you feel dizzy. This can increase your chance of falling. Ask your doctor what other things that you can do to help prevent falls. This information is not intended to replace advice given to you by your health care provider. Make sure you discuss any questions you have with your health care provider. Document Released: 04/10/2009 Document Revised: 11/20/2015 Document Reviewed: 07/19/2014 Elsevier Interactive Patient Education  2017 [employer].

## 2022-04-07 NOTE — Progress Notes (Signed)
Subjective:   Christina Hernandez is a 71 y.o. female who presents for Medicare Annual (Subsequent) preventive examination.  Review of Systems     Cardiac Risk Factors include: advanced age (>55mn, >>39women);diabetes mellitus;hypertension     Objective:    Today's Vitals   04/07/22 1407 04/07/22 1413 04/07/22 1437  BP: 130/74    Pulse: (!) 110  75  Temp: 98.6 F (37 C)    TempSrc: Oral    SpO2: 93%    Weight: 169 lb 3.2 oz (76.7 kg)    Height: '5\' 6"'$  (1.676 m)    PainSc:  5     Body mass index is 27.31 kg/m.     04/07/2022    2:22 PM 03/12/2021   10:57 AM 07/11/2020   11:17 AM 03/12/2020   11:38 AM 04/17/2019    1:35 PM 04/10/2019   10:45 AM 03/08/2019   12:14 PM  Advanced Directives  Does Patient Have a Medical Advance Directive? No No No No No No No  Would patient like information on creating a medical advance directive? No - Patient declined No - Patient declined  Yes (MAU/Ambulatory/Procedural Areas - Information given) No - Patient declined No - Patient declined     Current Medications (verified) Outpatient Encounter Medications as of 04/07/2022  Medication Sig   anastrozole (ARIMIDEX) 1 MG tablet Take 1 tablet (1 mg total) by mouth daily. Start Apr 29, 2019   baclofen (LIORESAL) 10 MG tablet TAKE 1 TABLET BY MOUTH THREE TIMES A DAY AS NEEDED FOR MUSCLE SPASMS   cetirizine (ZYRTEC) 10 MG tablet TAKE 1 TABLET BY MOUTH EVERY DAY   Cholecalciferol (VITAMIN D) 125 MCG (5000 UT) CAPS Take 5,000 Units by mouth daily.   COMBIGAN 0.2-0.5 % ophthalmic solution Place 1 drop into both eyes 2 (two) times daily.   dapagliflozin propanediol (FARXIGA) 10 MG TABS tablet Take 1 tablet (10 mg total) by mouth daily before breakfast.   DULoxetine (CYMBALTA) 60 MG capsule TAKE 1 CAPSULE BY MOUTH EVERY DAY   famotidine (PEPCID) 20 MG tablet TAKE 1 TABLET BY MOUTH TWICE A DAY   gabapentin (NEURONTIN) 300 MG capsule Take 3 capsules (900 mg total) by mouth 3 (three) times daily.    latanoprost (XALATAN) 0.005 % ophthalmic solution Place 1 drop into both eyes at bedtime.   levothyroxine (SYNTHROID) 50 MCG tablet TAKE 1 TABLET BY MOUTH MONDAY - FRIDAY ONLY   levothyroxine (SYNTHROID) 75 MCG tablet TAKE 1 TABLET BY MOUTH ON SAT - SUNDAY ONLY   losartan-hydrochlorothiazide (HYZAAR) 50-12.5 MG tablet TAKE 1 TABLET BY MOUTH EVERY DAY   pravastatin (PRAVACHOL) 80 MG tablet TAKE ONE TAB BY MOUTH M-F, SKIP SAT/SUN   Saxagliptin-Metformin 5-500 MG TB24 SMARTSIG:1 Tablet(s) By Mouth Every Evening   Accu-Chek FastClix Lancets MISC Use as instructed to check blood sugars 1 time per day dx: e11.22 (Patient not taking: Reported on 04/07/2022)   hydrOXYzine (ATARAX) 10 MG tablet Take 1 tablet (10 mg total) by mouth 3 (three) times daily as needed. (Patient not taking: Reported on 04/07/2022)   metFORMIN (GLUCOPHAGE-XR) 750 MG 24 hr tablet Take 1 tablet (750 mg total) by mouth daily. (Patient not taking: Reported on 04/07/2022)   No facility-administered encounter medications on file as of 04/07/2022.    Allergies (verified) Cyclobenzaprine   History: Past Medical History:  Diagnosis Date   Arthritis    Breast cancer, right breast ([place]    pending right breast lumpectomy on September 01, 2018   Diabetes  mellitus type II, controlled ([place])    On Kombiglyze   Family history of ovarian cancer    Family history of thyroid cancer    GERD (gastroesophageal reflux disease)    rarely occurs- no meds   Hyperlipidemia associated with type 2 diabetes mellitus ([place])    Hypertension    Hypothyroidism    Personal history of radiation therapy    Primary localized osteoarthritis of right knee 04/17/2019   S/P knee replacement 04/17/2019   Trigeminal neuralgia of left side of face 2015   s/p Gamma knife   Past Surgical History:  Procedure Laterality Date   BREAST BIOPSY Right 07/24/2018   BREAST LUMPECTOMY Right 09/01/2018   BREAST LUMPECTOMY WITH RADIOACTIVE SEED LOCALIZATION Right 09/01/2018    Procedure: RIGHT BREAST LUMPECTOMY WITH RADIOACTIVE SEED LOCALIZATION;  Surgeon: [name], MD;  Location: [facility];  Service: General;  Laterality: Right;   COLONOSCOPY     Gamma Knife Left 04/08/2016   HYSTEROSCOPY WITH D & C  05/24/2011   Procedure: DILATATION AND CURETTAGE (D&C) /HYSTEROSCOPY;  Surgeon: [name], MD;  Location: [facility];  Service: Gynecology;  Laterality: N/A;   KNEE SURGERY Right 06/10/15   torn ligament   PARTIAL KNEE ARTHROPLASTY Right 04/17/2019   Procedure: UNICOMPARTMENTAL KNEE;  Surgeon: [name], MD;  Location: [facility];  Service: Orthopedics;  Laterality: Right;   THYROIDECTOMY N/A 09/08/2016   Procedure: TOTAL THYROIDECTOMY;  Surgeon: [name], MD;  Location: [facility];  Service: ENT;  Laterality: N/A;   TUBAL LIGATION     uterine polyp     WISDOM TOOTH EXTRACTION Left 2014   Family History  Problem Relation Age of Onset   Hypertension Mother    Kidney failure Mother    Congestive Heart Failure Mother    Heart Problems Father    Stroke Father    Pneumonia Father    Heart Problems Sister 74       heart transplant   Stroke Paternal Grandmother    Dementia Paternal Grandmother    Stroke Paternal Grandfather    Cardiomyopathy Sister    Ovarian cancer Sister 27       d. 42   Thyroid disease Sister    Thyroid disease Sister    Thyroid cancer Niece 77   Breast cancer Neg Hx    Social History   Socioeconomic History   Marital status: Married    Spouse name: Christina Hernandez   Number of children: 4   Years of education: BS   Highest education level: Not on file  Occupational History   Occupation: Retired    Fish farm manager: OTHER  Tobacco Use   Smoking status: Never   Smokeless tobacco: Never  Vaping Use   Vaping Use: Never used  Substance and Sexual Activity   Alcohol use: No   Drug use: No   Sexual activity: Not Currently  Other Topics Concern   Not on file  Social History Narrative   Patient lives at home with family.    Caffeine Use: occasionally      Lives at home with husband and son.  Is a retired Psychologist, prison and probation services Strain: [place]  (04/07/2022)   Overall Financial Resource Strain (CARDIA)    Difficulty of Paying Living Expenses: Not hard at all  Food Insecurity: No Food Insecurity (04/07/2022)   Hunger Vital Sign    Worried About Running Out of Food in the Last Year: Never true  Ran Out of Food in the Last Year: Never true  Transportation Needs: No Transportation Needs (04/07/2022)   PRAPARE - Hydrologist (Medical): No    Lack of Transportation (Non-Medical): No  Physical Activity: Inactive (04/07/2022)   Exercise Vital Sign    Days of Exercise per Week: 0 days    Minutes of Exercise per Session: 0 min  Stress: No Stress Concern Present (04/07/2022)   [place]    Feeling of Stress : Not at all  Social Connections: Not on file    Tobacco Counseling Counseling given: Not Answered   Clinical Intake:  Pre-visit preparation completed: Yes  Pain : 0-10 Pain Score: 5  Pain Type: Chronic pain Pain Location: Face Pain Orientation: Left Pain Descriptors / Indicators: Burning, Sharp Pain Onset: More than a month ago Pain Frequency: Constant     Nutritional Status: BMI 25 -29 Overweight Nutritional Risks: Nausea/ vomitting/ diarrhea (nauseous this morning) Diabetes: Yes  How often do you need to have someone help you when you read instructions, pamphlets, or other written materials from your doctor or pharmacy?: 1 - Never What is the last grade level you completed in school?: college  Diabetic? Yes Nutrition Risk Assessment:  Has the patient had any N/V/D within the last 2 months?  Yes  Does the patient have any non-healing wounds?  No  Has the patient had any unintentional weight loss or weight gain?  No   Diabetes:  Is the patient  diabetic?  Yes  If diabetic, was a CBG obtained today?  No  Did the patient bring in their glucometer from home?  No  How often do you monitor your CBG's? Does not.   Financial Strains and Diabetes Management:  Are you having any financial strains with the device, your supplies or your medication? No .  Does the patient want to be seen by Chronic Care Management for management of their diabetes?  No  Would the patient like to be referred to a Nutritionist or for Diabetic Management?  No   Diabetic Exams:  Diabetic Eye Exam: Completed 2023 Diabetic Foot Exam: Completed 06/05/2021   Interpreter Needed?: No  Information entered by :: NAllen LPN   Activities of Daily Living    04/07/2022    2:26 PM  In your present state of health, do you have any difficulty performing the following activities:  Hearing? 1  Vision? 1  Comment vision blurry at times  Difficulty concentrating or making decisions? 0  Walking or climbing stairs? 1  Comment legs get tired sometimes  Dressing or bathing? 0  Doing errands, shopping? 0  Preparing Food and eating ? N  Using the Toilet? N  In the past six months, have you accidently leaked urine? Y  Do you have problems with loss of bowel control? N  Managing your Medications? N  Managing your Finances? N  Housekeeping or managing your Housekeeping? N    Patient Care Team: Glendale Chard, MD as PCP - General (Internal Medicine) [name], MD as PCP - Cardiology (Cardiology) [name], MD as Consulting Physician (Obstetrics and Gynecology) [name], MD as Consulting Physician (General Surgery) Magrinat, [name], MD (Inactive) as Consulting Physician (Oncology) [name], MD as Consulting Physician (Radiation Oncology) [name], Earlean Polka, MD as Consulting Physician (Neurology) [name], MD as Consulting Physician (Pulmonary Disease) [name], MD as Consulting Physician (Orthopedic Surgery) Clydell Hakim, MD  (Inactive) as Consulting  Physician (Anesthesiology)  Indicate any recent Medical Services you may have received from other than [facility] providers in the past year (date may be approximate).     Assessment:   This is a routine wellness examination for Christina Hernandez.  Hearing/Vision screen Vision Screening - Comments:: Regular eye exams, Dr. Venetia Maxon  Dietary issues and exercise activities discussed: Current Exercise Habits: The patient does not participate in regular exercise at present   Goals Addressed             This Visit's Progress    Patient Stated       04/07/2022, wants to lose weight and strength legs and arms, eat healthier       Depression Screen    04/07/2022    2:25 PM 03/12/2021   10:59 AM 03/12/2020   11:40 AM 10/01/2019    8:58 AM 04/09/2019    3:20 PM 03/08/2019   12:15 PM 02/27/2019   12:14 PM  PHQ 2/9 Scores  PHQ - 2 Score 0 0 0 0 0 0 0    Fall Risk    04/07/2022    2:23 PM 03/12/2021   10:58 AM 03/12/2020   11:39 AM 10/01/2019    8:58 AM 04/09/2019    3:20 PM  Fall Risk   Falls in the past year? 1 1 0 0 0  Comment rolled down the hill of her house, fell on the escalator missed a step     Number falls in past yr: 1 1     Injury with Fall? 0 0     Risk for fall due to : Medication side effect;Impaired balance/gait History of fall(s);Medication side effect Medication side effect    Follow up Falls prevention discussed;Education provided;Falls evaluation completed Falls evaluation completed;Education provided;Falls prevention discussed Falls evaluation completed;Education provided;Falls prevention discussed      FALL RISK PREVENTION PERTAINING TO THE HOME:  Any stairs in or around the home? Yes  If so, are there any without handrails? No  Home free of loose throw rugs in walkways, pet beds, electrical cords, etc? Yes  Adequate lighting in your home to reduce risk of falls? Yes   ASSISTIVE DEVICES UTILIZED TO PREVENT FALLS:  Life alert? No  Use of a cane,  walker or w/c? No  Grab bars in the bathroom? Yes  Shower chair or bench in shower? No  Elevated toilet seat or a handicapped toilet? Yes   TIMED UP AND GO:  Was the test performed? Yes .  Length of time to ambulate 10 feet: 5 sec.   Gait slow and steady without use of assistive device  Cognitive Function:        04/07/2022    2:29 PM 03/12/2021   11:00 AM 03/12/2020   11:41 AM 03/08/2019   12:17 PM 05/10/2018   10:45 AM  6CIT Screen  What Year? 0 points 0 points 0 points 0 points 0 points  What month? 0 points 0 points 0 points 0 points 0 points  What time? 0 points 0 points 0 points 0 points 0 points  Count back from 20 4 points 0 points 0 points 0 points 0 points  Months in reverse 0 points 0 points 0 points 0 points 0 points  Repeat phrase 2 points 2 points 0 points 2 points 0 points  Total Score 6 points 2 points 0 points 2 points 0 points    Immunizations Immunization History  Administered Date(s) Administered   Fluad Quad(high Dose 65+) 02/27/2019, 03/12/2020, 03/12/2021,  03/23/2022   Influenza, High Dose Seasonal PF 03/27/2018, 02/27/2019   Influenza,inj,Quad PF,6+ Mos 04/12/2016   Moderna Sars-Covid-2 Vaccination 08/06/2019, 09/03/2019, 03/31/2020   PFIZER Comirnaty(Gray Top)Covid-19 Tri-Sucrose Vaccine 10/24/2020   Pfizer Covid-19 Vaccine Bivalent Booster 51yr & up 06/13/2021   Pneumococcal Conjugate-13 03/17/2020   Pneumococcal Polysaccharide-23 05/25/2016   Tdap 05/28/2011, 03/23/2022   Zoster Recombinat (Shingrix) 02/22/2022    TDAP status: Up to date  Flu Vaccine status: Up to date  Pneumococcal vaccine status: Up to date  Covid-19 vaccine status: Completed vaccines  Qualifies for Shingles Vaccine? Yes   Zostavax completed No   Shingrix Completed?: needs second dose  Screening Tests Health Maintenance  Topic Date Due   COVID-19 Vaccine (6 - Moderna risk series) 08/08/2021   OPHTHALMOLOGY EXAM  12/16/2021   Zoster Vaccines- Shingrix (2 of 2)  04/19/2022   FOOT EXAM  06/15/2022   HEMOGLOBIN A1C  08/25/2022   Diabetic kidney evaluation - GFR measurement  02/23/2023   Diabetic kidney evaluation - Urine ACR  02/24/2023   MAMMOGRAM  08/25/2023   COLONOSCOPY (Pts 45-430yrInsurance coverage will need to be confirmed)  02/08/2027   TETANUS/TDAP  03/23/2032   Pneumonia Vaccine 6529Years old  Completed   INFLUENZA VACCINE  Completed   DEXA SCAN  Completed   Hepatitis C Screening  Completed   HPV VACCINES  Aged Out    Health Maintenance  Health Maintenance Due  Topic Date Due   COVID-19 Vaccine (6 - Moderna risk series) 08/08/2021   OPHTHALMOLOGY EXAM  12/16/2021    Colorectal cancer screening: Type of screening: Colonoscopy. Completed 02/07/2017. Repeat every 10 years  Mammogram status: Completed 08/24/2021. Repeat every year  Bone Density status: Completed 07/29/2014.   Lung Cancer Screening: (Low Dose CT Chest recommended if Age 73-80ears, 30 pack-year currently smoking OR have quit w/in 15years.) does not qualify.   Lung Cancer Screening Referral: no  Additional Screening:  Hepatitis C Screening: does qualify; Completed 06/05/2012  Vision Screening: Recommended annual ophthalmology exams for early detection of glaucoma and other disorders of the eye. Is the patient up to date with their annual eye exam?  Yes  Who is the provider or what is the name of the office in which the patient attends annual eye exams? Dr. WhVenetia Maxonf pt is not established with a provider, would they like to be referred to a provider to establish care? No .   Dental Screening: Recommended annual dental exams for proper oral hygiene  Community Resource Referral / Chronic Care Management: CRR required this visit?  No   CCM required this visit?  No      Plan:     I have personally reviewed and noted the following in the patient's chart:   Medical and social history Use of alcohol, tobacco or illicit drugs  Current medications and  supplements including opioid prescriptions. Patient is not currently taking opioid prescriptions. Functional ability and status Nutritional status Physical activity Advanced directives List of other physicians Hospitalizations, surgeries, and ER visits in previous 12 months Vitals Screenings to include cognitive, depression, and falls Referrals and appointments  In addition, I have reviewed and discussed with patient certain preventive protocols, quality metrics, and best practice recommendations. A written personalized care plan for preventive services as well as general preventive health recommendations were provided to patient.     NiKellie SimmeringLPN   [id number] Nurse Notes: none

## 2022-04-19 ENCOUNTER — Other Ambulatory Visit: Payer: Self-pay | Admitting: Nurse Practitioner

## 2022-04-29 ENCOUNTER — Other Ambulatory Visit: Payer: Self-pay | Admitting: Internal Medicine

## 2022-04-29 DIAGNOSIS — E1165 Type 2 diabetes mellitus with hyperglycemia: Secondary | ICD-10-CM

## 2022-05-11 ENCOUNTER — Encounter: Payer: Self-pay | Admitting: Internal Medicine

## 2022-05-11 ENCOUNTER — Ambulatory Visit: Payer: Medicare PPO | Admitting: Internal Medicine

## 2022-05-11 VITALS — BP 120/70 | HR 94 | Temp 98.1°F | Ht 66.0 in | Wt 174.8 lb

## 2022-05-11 DIAGNOSIS — E1165 Type 2 diabetes mellitus with hyperglycemia: Secondary | ICD-10-CM

## 2022-05-11 DIAGNOSIS — Z6828 Body mass index (BMI) 28.0-28.9, adult: Secondary | ICD-10-CM | POA: Diagnosis not present

## 2022-05-11 DIAGNOSIS — G500 Trigeminal neuralgia: Secondary | ICD-10-CM | POA: Diagnosis not present

## 2022-05-11 DIAGNOSIS — F5101 Primary insomnia: Secondary | ICD-10-CM

## 2022-05-11 DIAGNOSIS — I10 Essential (primary) hypertension: Secondary | ICD-10-CM

## 2022-05-11 MED ORDER — GABAPENTIN 300 MG PO CAPS: 900.0000 mg | ORAL_CAPSULE | Freq: Three times a day (TID) | ORAL | 6 refills | Status: DC

## 2022-05-11 MED ORDER — DAPAGLIFLOZIN PROPANEDIOL 10 MG PO TABS: 10.0000 mg | ORAL_TABLET | Freq: Every day | ORAL | 1 refills | Status: DC

## 2022-05-11 NOTE — Patient Instructions (Addendum)
Tart cherry juice  Diabetes Mellitus and Nutrition, Adult When you have diabetes, or diabetes mellitus, it is very important to have healthy eating habits because your blood sugar (glucose) levels are greatly affected by what you eat and drink. Eating healthy foods in the right amounts, at about the same times every day, can help you: Manage your blood glucose. Lower your risk of heart disease. Improve your blood pressure. Reach or maintain a healthy weight. What can affect my meal plan? Every person with diabetes is different, and each person has different needs for a meal plan. Your health care provider may recommend that you work with a dietitian to make a meal plan that is best for you. Your meal plan may vary depending on factors such as: The calories you need. The medicines you take. Your weight. Your blood glucose, blood pressure, and cholesterol levels. Your activity level. Other health conditions you have, such as heart or kidney disease. How do carbohydrates affect me? Carbohydrates, also called carbs, affect your blood glucose level more than any other type of food. Eating carbs raises the amount of glucose in your blood. It is important to know how many carbs you can safely have in each meal. This is different for every person. Your dietitian can help you calculate how many carbs you should have at each meal and for each snack. How does alcohol affect me? Alcohol can cause a decrease in blood glucose (hypoglycemia), especially if you use insulin or take certain diabetes medicines by mouth. Hypoglycemia can be a life-threatening condition. Symptoms of hypoglycemia, such as sleepiness, dizziness, and confusion, are similar to symptoms of having too much alcohol. Do not drink alcohol if: Your health care provider tells you not to drink. You are pregnant, may be pregnant, or are planning to become pregnant. If you drink alcohol: Limit how much you have to: 0-1 drink a day for  women. 0-2 drinks a day for men. Know how much alcohol is in your drink. In the [place] one drink equals one 12 oz bottle of beer (355 mL), one 5 oz glass of wine (148 mL), or one 1 oz glass of hard liquor (44 mL). Keep yourself hydrated with water, diet soda, or unsweetened iced tea. Keep in mind that regular soda, juice, and other mixers may contain a lot of sugar and must be counted as carbs. What are tips for following this plan?  Reading food labels Start by checking the serving size on the Nutrition Facts label of packaged foods and drinks. The number of calories and the amount of carbs, fats, and other nutrients listed on the label are based on one serving of the item. Many items contain more than one serving per package. Check the total grams (g) of carbs in one serving. Check the number of grams of saturated fats and trans fats in one serving. Choose foods that have a low amount or none of these fats. Check the number of milligrams (mg) of salt (sodium) in one serving. Most people should limit total sodium intake to less than 2,300 mg per day. Always check the nutrition information of foods labeled as "low-fat" or "nonfat." These foods may be higher in added sugar or refined carbs and should be avoided. Talk to your dietitian to identify your daily goals for nutrients listed on the label. Shopping Avoid buying canned, pre-made, or processed foods. These foods tend to be high in fat, sodium, and added sugar. Shop around the outside edge of the grocery store.  This is where you will most often find fresh fruits and vegetables, bulk grains, fresh meats, and fresh dairy products. Cooking Use low-heat cooking methods, such as baking, instead of high-heat cooking methods, such as deep frying. Cook using healthy oils, such as olive, canola, or sunflower oil. Avoid cooking with butter, cream, or high-fat meats. Meal planning Eat meals and snacks regularly, preferably at the same times every day.  Avoid going long periods of time without eating. Eat foods that are high in fiber, such as fresh fruits, vegetables, beans, and whole grains. Eat 4-6 oz (112-168 g) of lean protein each day, such as lean meat, chicken, fish, eggs, or tofu. One ounce (oz) (28 g) of lean protein is equal to: 1 oz (28 g) of meat, chicken, or fish. 1 egg.  cup (62 g) of tofu. Eat some foods each day that contain healthy fats, such as avocado, nuts, seeds, and fish. What foods should I eat? Fruits Berries. Apples. Oranges. Peaches. Apricots. Plums. Grapes. Mangoes. Papayas. Pomegranates. Kiwi. Cherries. Vegetables Leafy greens, including lettuce, spinach, kale, chard, collard greens, mustard greens, and cabbage. Beets. Cauliflower. Broccoli. Carrots. Green beans. Tomatoes. Peppers. Onions. Cucumbers. Brussels sprouts. Grains Whole grains, such as whole-wheat or whole-grain bread, crackers, tortillas, cereal, and pasta. Unsweetened oatmeal. Quinoa. Brown or wild rice. Meats and other proteins Seafood. Poultry without skin. Lean cuts of poultry and beef. Tofu. Nuts. Seeds. Dairy Low-fat or fat-free dairy products such as milk, yogurt, and cheese. The items listed above may not be a complete list of foods and beverages you can eat and drink. Contact a dietitian for more information. What foods should I avoid? Fruits Fruits canned with syrup. Vegetables Canned vegetables. Frozen vegetables with butter or cream sauce. Grains Refined white flour and flour products such as bread, pasta, snack foods, and cereals. Avoid all processed foods. Meats and other proteins Fatty cuts of meat. Poultry with skin. Breaded or fried meats. Processed meat. Avoid saturated fats. Dairy Full-fat yogurt, cheese, or milk. Beverages Sweetened drinks, such as soda or iced tea. The items listed above may not be a complete list of foods and beverages you should avoid. Contact a dietitian for more information. Questions to ask a health  care provider Do I need to meet with a certified diabetes care and education specialist? Do I need to meet with a dietitian? What number can I call if I have questions? When are the best times to check my blood glucose? Where to find more information: American Diabetes Association: diabetes.org Academy of Nutrition and Dietetics: eatright.[employer] of Diabetes and Digestive and Kidney Diseases: [employer] Association of Diabetes Care & Education Specialists: diabeteseducator.org Summary It is important to have healthy eating habits because your blood sugar (glucose) levels are greatly affected by what you eat and drink. It is important to use alcohol carefully. A healthy meal plan will help you manage your blood glucose and lower your risk of heart disease. Your health care provider may recommend that you work with a dietitian to make a meal plan that is best for you. This information is not intended to replace advice given to you by your health care provider. Make sure you discuss any questions you have with your health care provider. Document Revised: 01/16/2020 Document Reviewed: 01/16/2020 Elsevier Patient Education  [place].

## 2022-05-11 NOTE — Progress Notes (Signed)
Barnet Glasgow Martin,acting as a Education administrator for [name], MD.,have documented all relevant documentation on the behalf of [name], MD,as directed by  [name], MD while in the presence of [name], MD.    Subjective:     Patient ID: Christina Hernandez , female    DOB: [birth year] , 71 y.o.   MRN: [id number]   Chief Complaint  Patient presents with   Diabetes   Hypertension    HPI  Patient presents today for a DM and BP check. Patient reports compliance with medications. She denies having any headaches, chest pain and shortness of breath. She has no new concerns at this time.   BP Readings from Last 3 Encounters: 05/11/22 : 120/70 04/07/22 : 130/74 03/23/22 : 118/64    Diabetes She presents for her follow-up diabetic visit. She has type 2 diabetes mellitus. There are no hypoglycemic associated symptoms. There are no diabetic associated symptoms. Pertinent negatives for diabetes include no blurred vision and no chest pain. There are no hypoglycemic complications. Risk factors for coronary artery disease include diabetes mellitus, dyslipidemia, hypertension, sedentary lifestyle and post-menopausal. She participates in exercise intermittently. An ACE inhibitor/angiotensin II receptor blocker is being taken. Eye exam is current.  Hypertension This is a chronic problem. The current episode started more than 1 year ago. The problem has been gradually improving since onset. The problem is controlled. Pertinent negatives include no blurred vision, chest pain, palpitations or shortness of breath. Risk factors for coronary artery disease include diabetes mellitus, dyslipidemia, post-menopausal state and sedentary lifestyle. The current treatment provides moderate improvement. Compliance problems include exercise.      Past Medical History:  Diagnosis Date   Arthritis    Breast cancer, right breast ([place])    pending right breast lumpectomy on September 01, 2018   Diabetes  mellitus type II, controlled ([place])    On Kombiglyze   Family history of ovarian cancer    Family history of thyroid cancer    GERD (gastroesophageal reflux disease)    rarely occurs- no meds   Hyperlipidemia associated with type 2 diabetes mellitus ([place])    Hypertension    Hypothyroidism    Personal history of radiation therapy    Primary localized osteoarthritis of right knee 04/17/2019   S/P knee replacement 04/17/2019   Trigeminal neuralgia of left side of face 2015   s/p Gamma knife     Family History  Problem Relation Age of Onset   Hypertension Mother    Kidney failure Mother    Congestive Heart Failure Mother    Heart Problems Father    Stroke Father    Pneumonia Father    Heart Problems Sister 15       heart transplant   Stroke Paternal Grandmother    Dementia Paternal Grandmother    Stroke Paternal Grandfather    Cardiomyopathy Sister    Ovarian cancer Sister 32       d. 58   Thyroid disease Sister    Thyroid disease Sister    Thyroid cancer Niece 61   Breast cancer Neg Hx      Current Outpatient Medications:    anastrozole (ARIMIDEX) 1 MG tablet, Take 1 tablet (1 mg total) by mouth daily. Start Apr 29, 2019, Disp: 90 tablet, Rfl: 4   baclofen (LIORESAL) 10 MG tablet, TAKE 1 TABLET BY MOUTH THREE TIMES A DAY AS NEEDED FOR MUSCLE SPASM, Disp: 270 tablet, Rfl: 0   cetirizine (ZYRTEC) 10 MG  tablet, TAKE 1 TABLET BY MOUTH EVERY DAY, Disp: 30 tablet, Rfl: 0   Cholecalciferol (VITAMIN D) 125 MCG (5000 UT) CAPS, Take 5,000 Units by mouth daily., Disp: , Rfl:    COMBIGAN 0.2-0.5 % ophthalmic solution, Place 1 drop into both eyes 2 (two) times daily., Disp: , Rfl:    DULoxetine (CYMBALTA) 60 MG capsule, TAKE 1 CAPSULE BY MOUTH EVERY DAY, Disp: 90 capsule, Rfl: 1   famotidine (PEPCID) 20 MG tablet, TAKE 1 TABLET BY MOUTH TWICE A DAY, Disp: 60 tablet, Rfl: 1   latanoprost (XALATAN) 0.005 % ophthalmic solution, Place 1 drop into both eyes at bedtime., Disp: , Rfl:     levothyroxine (SYNTHROID) 50 MCG tablet, TAKE 1 TABLET BY MOUTH MONDAY - FRIDAY ONLY, Disp: 90 tablet, Rfl: 1   levothyroxine (SYNTHROID) 75 MCG tablet, TAKE 1 TABLET BY MOUTH ON SAT - SUNDAY ONLY, Disp: 90 tablet, Rfl: 1   losartan-hydrochlorothiazide (HYZAAR) 50-12.5 MG tablet, TAKE 1 TABLET BY MOUTH EVERY DAY, Disp: 90 tablet, Rfl: 1   metFORMIN (GLUCOPHAGE-XR) 750 MG 24 hr tablet, TAKE 1 TABLET BY MOUTH EVERY DAY, Disp: 90 tablet, Rfl: 1   pravastatin (PRAVACHOL) 80 MG tablet, TAKE ONE TAB BY MOUTH M-F, SKIP SAT/SUN, Disp: 75 tablet, Rfl: 2   Saxagliptin-Metformin 5-500 MG TB24, TAKE 1 TABLET BY MOUTH EVERY DAY BEFORE SUPPER, Disp: 90 tablet, Rfl: 1   Accu-Chek FastClix Lancets MISC, Use as instructed to check blood sugars 1 time per day dx: e11.22 (Patient not taking: Reported on 04/07/2022), Disp: 150 each, Rfl: 2   dapagliflozin propanediol (FARXIGA) 10 MG TABS tablet, Take 1 tablet (10 mg total) by mouth daily before breakfast., Disp: 90 tablet, Rfl: 1   gabapentin (NEURONTIN) 300 MG capsule, Take 3 capsules (900 mg total) by mouth 3 (three) times daily., Disp: 270 capsule, Rfl: 6   Allergies  Allergen Reactions   Cyclobenzaprine Nausea Only     Review of Systems  Constitutional: Negative.   HENT: Negative.    Eyes: Negative.  Negative for blurred vision.  Respiratory: Negative.  Negative for shortness of breath.   Cardiovascular: Negative.  Negative for chest pain and palpitations.  Gastrointestinal: Negative.   Neurological:        C/o facial pain. Needs refill of gabapentin. Has previous dx trigeminal neuralgia.   Psychiatric/Behavioral:  Positive for sleep disturbance.      Today's Vitals   05/11/22 1153  BP: 120/70  Pulse: 94  Temp: 98.1 F (36.7 C)  TempSrc: Oral  Weight: 174 lb 12.8 oz (79.3 kg)  Height: _0  (1.676 m)  PainSc: 3   PainLoc: Face   Body mass index is 28.21 kg/m.  Wt Readings from Last 3 Encounters:  05/11/22 174 lb 12.8 oz (79.3 kg)  04/07/22  169 lb 3.2 oz (76.7 kg)  02/22/22 178 lb 9.6 oz (81 kg)    Objective:  Physical Exam Vitals and nursing note reviewed.  Constitutional:      Appearance: Normal appearance.  HENT:     Head: Normocephalic and atraumatic.     Nose:     Comments: Masked     Mouth/Throat:     Comments: Masked  Eyes:     Extraocular Movements: Extraocular movements intact.  Cardiovascular:     Rate and Rhythm: Normal rate and regular rhythm.     Heart sounds: Normal heart sounds.  Pulmonary:     Effort: Pulmonary effort is normal.     Breath sounds: Normal breath sounds.  Musculoskeletal:     Cervical back: Normal range of motion.  Skin:    General: Skin is warm.  Neurological:     General: No focal deficit present.     Mental Status: She is alert.  Psychiatric:        Mood and Affect: Mood normal.        Behavior: Behavior normal.       Assessment And Plan:     1. Uncontrolled type 2 diabetes mellitus with hyperglycemia (HCC) Comments: Chronic, importance of medication/dietary compliance was d/w patient. I will check labs as below. She agrees to rto in 3-4 months for re-evaluation. - Hemoglobin A1c - Lipid panel - dapagliflozin propanediol (FARXIGA) 10 MG TABS tablet; Take 1 tablet (10 mg total) by mouth daily before breakfast.  Dispense: 90 tablet; Refill: 1  2. Essential hypertension Comments: Chronic, well controlled. She will c/w losartan/hct 50/12.55m daily.  She will f/u n 4 months. - CMP14+EGFR - Lipid panel  3. Trigeminal neuralgia of left side of face Comments: Chronic, she was given refill of gabapentin. She will c/w gabapentin 3067m3 tabs po tid initially prescribed per Neurology.  4. Primary insomnia Comments: Chronic, she was given samples of Belsomra, 1575mo use nightly prn. She will let me know if her sx persist.  5. Body mass index (BMI) of 28.0 to 28.9 in adult Comments: She is encouraged to aim for at least 150 minutes of exercise per week.   Patient was given  opportunity to ask questions. Patient verbalized understanding of the plan and was able to repeat key elements of the plan. All questions were answered to their satisfaction.   I, RobMaximino GreenlandD, have reviewed all documentation for this visit. The documentation on 05/11/22 for the exam, diagnosis, procedures, and orders are all accurate and complete.   IF YOU HAVE BEEN REFERRED TO A SPECIALIST, IT MAY TAKE 1-2 WEEKS TO SCHEDULE/PROCESS THE REFERRAL. IF YOU HAVE NOT HEARD FROM US/SPECIALIST IN TWO WEEKS, PLEASE GIVE US [place]CALL AT [phone].   THE PATIENT IS ENCOURAGED TO PRACTICE SOCIAL DISTANCING DUE TO THE COVID-19 PANDEMIC.

## 2022-05-12 LAB — HEMOGLOBIN A1C
Est. average glucose Bld gHb Est-mCnc: 128 mg/dL
Hgb A1c MFr Bld: 6.1 % — ABNORMAL HIGH (ref 4.8–5.6)

## 2022-05-12 LAB — CMP14+EGFR
ALT: 19 IU/L (ref 0–32)
AST: 20 IU/L (ref 0–40)
Albumin/Globulin Ratio: 1.6 (ref 1.2–2.2)
Albumin: 4.4 g/dL (ref 3.8–4.8)
Alkaline Phosphatase: 98 IU/L (ref 44–121)
BUN/Creatinine Ratio: 25 (ref 12–28)
BUN: 16 mg/dL (ref 8–27)
Bilirubin Total: 0.5 mg/dL (ref 0.0–1.2)
CO2: 27 mmol/L (ref 20–29)
Calcium: 9.6 mg/dL (ref 8.7–10.3)
Chloride: 100 mmol/L (ref 96–106)
Creatinine, Ser: 0.65 mg/dL (ref 0.57–1.00)
Globulin, Total: 2.7 g/dL (ref 1.5–4.5)
Glucose: 95 mg/dL (ref 70–99)
Potassium: 4.2 mmol/L (ref 3.5–5.2)
Sodium: 142 mmol/L (ref 134–144)
Total Protein: 7.1 g/dL (ref 6.0–8.5)
eGFR: 93 mL/min/{1.73_m2} (ref >59)

## 2022-05-12 LAB — LIPID PANEL
Chol/HDL Ratio: 2.9 ratio (ref 0.0–4.4)
Cholesterol, Total: 170 mg/dL (ref 100–199)
HDL: 58 mg/dL (ref >39)
LDL Chol Calc (NIH): 96 mg/dL (ref 0–99)
Triglycerides: 87 mg/dL (ref 0–149)
VLDL Cholesterol Cal: 16 mg/dL (ref 5–40)

## 2022-06-01 DIAGNOSIS — H26492 Other secondary cataract, left eye: Secondary | ICD-10-CM | POA: Diagnosis not present

## 2022-06-01 DIAGNOSIS — H401133 Primary open-angle glaucoma, bilateral, severe stage: Secondary | ICD-10-CM | POA: Diagnosis not present

## 2022-06-01 DIAGNOSIS — H02403 Unspecified ptosis of bilateral eyelids: Secondary | ICD-10-CM | POA: Diagnosis not present

## 2022-06-01 DIAGNOSIS — H2511 Age-related nuclear cataract, right eye: Secondary | ICD-10-CM | POA: Diagnosis not present

## 2022-06-01 DIAGNOSIS — G500 Trigeminal neuralgia: Secondary | ICD-10-CM | POA: Diagnosis not present

## 2022-06-01 DIAGNOSIS — E119 Type 2 diabetes mellitus without complications: Secondary | ICD-10-CM | POA: Diagnosis not present

## 2022-06-01 LAB — HM DIABETES EYE EXAM

## 2022-07-01 ENCOUNTER — Encounter: Payer: Self-pay | Admitting: Internal Medicine

## 2022-07-01 ENCOUNTER — Ambulatory Visit (INDEPENDENT_AMBULATORY_CARE_PROVIDER_SITE_OTHER): Payer: Medicare PPO | Admitting: Internal Medicine

## 2022-07-01 VITALS — BP 138/64 | HR 91 | Temp 98.4°F | Ht 65.2 in | Wt 173.8 lb

## 2022-07-01 DIAGNOSIS — Z0000 Encounter for general adult medical examination without abnormal findings: Secondary | ICD-10-CM

## 2022-07-01 DIAGNOSIS — E785 Hyperlipidemia, unspecified: Secondary | ICD-10-CM

## 2022-07-01 DIAGNOSIS — E1165 Type 2 diabetes mellitus with hyperglycemia: Secondary | ICD-10-CM | POA: Diagnosis not present

## 2022-07-01 DIAGNOSIS — E890 Postprocedural hypothyroidism: Secondary | ICD-10-CM | POA: Diagnosis not present

## 2022-07-01 DIAGNOSIS — E1169 Type 2 diabetes mellitus with other specified complication: Secondary | ICD-10-CM

## 2022-07-01 DIAGNOSIS — I700 Atherosclerosis of aorta: Secondary | ICD-10-CM

## 2022-07-01 DIAGNOSIS — J069 Acute upper respiratory infection, unspecified: Secondary | ICD-10-CM | POA: Diagnosis not present

## 2022-07-01 DIAGNOSIS — C50011 Malignant neoplasm of nipple and areola, right female breast: Secondary | ICD-10-CM

## 2022-07-01 DIAGNOSIS — I1310 Hypertensive heart and chronic kidney disease without heart failure, with stage 1 through stage 4 chronic kidney disease, or unspecified chronic kidney disease: Secondary | ICD-10-CM

## 2022-07-01 DIAGNOSIS — I119 Hypertensive heart disease without heart failure: Secondary | ICD-10-CM | POA: Diagnosis not present

## 2022-07-01 DIAGNOSIS — Z6828 Body mass index (BMI) 28.0-28.9, adult: Secondary | ICD-10-CM

## 2022-07-01 NOTE — Progress Notes (Addendum)
Rich Brave Llittleton,acting as a Education administrator for [name], MD.,have documented all relevant documentation on the behalf of [name], MD,as directed by  [name], MD while in the presence of [name], MD.   Subjective:     Patient ID: Christina Hernandez , female    DOB: 11-27-1950 , 72 y.o.   MRN: [id number]   Chief Complaint  Patient presents with   Annual Exam   Diabetes   Hypertension    HPI  She is here today for a full physical examination. She is followed by [facility] for her GYN exams. She reports compliance with meds. She denies having headaches, chest pain and shortness of breath.   Patient reports she has a runny nose and has not been feeling the greatest since Wednesday. She also has fatigue, body aches and cough. No headaches. She does not think she has had a fever. She denies ill contacts.   Diabetes She presents for her follow-up diabetic visit. She has type 2 diabetes mellitus. There are no hypoglycemic associated symptoms. Associated symptoms include fatigue. Pertinent negatives for diabetes include no blurred vision. There are no hypoglycemic complications. Risk factors for coronary artery disease include diabetes mellitus, dyslipidemia, hypertension, sedentary lifestyle and post-menopausal. She participates in exercise intermittently. An ACE inhibitor/angiotensin II receptor blocker is being taken. Eye exam is current.  Hypertension This is a chronic problem. The current episode started more than 1 year ago. The problem has been gradually improving since onset. The problem is controlled. Pertinent negatives include no blurred vision. Compliance problems include exercise.      Past Medical History:  Diagnosis Date   Arthritis    Breast cancer, right breast ([place])    pending right breast lumpectomy on September 01, 2018   Diabetes mellitus type II, controlled ([place])    On Kombiglyze   Family history of ovarian cancer    Family history of thyroid  cancer    GERD (gastroesophageal reflux disease)    rarely occurs- no meds   Hyperlipidemia associated with type 2 diabetes mellitus ([place])    Hypertension    Hypothyroidism    Personal history of radiation therapy    Primary localized osteoarthritis of right knee 04/17/2019   S/P knee replacement 04/17/2019   Trigeminal neuralgia of left side of face 2015   s/p Gamma knife     Family History  Problem Relation Age of Onset   Hypertension Mother    Kidney failure Mother    Congestive Heart Failure Mother    Heart Problems Father    Stroke Father    Pneumonia Father    Heart Problems Sister 31       heart transplant   Stroke Paternal Grandmother    Dementia Paternal Grandmother    Stroke Paternal Grandfather    Cardiomyopathy Sister    Ovarian cancer Sister 41       d. 7   Thyroid disease Sister    Thyroid disease Sister    Thyroid cancer Niece 58   Breast cancer Neg Hx      Current Outpatient Medications:    Accu-Chek FastClix Lancets MISC, Use as instructed to check blood sugars 1 time per day dx: e11.22, Disp: 150 each, Rfl: 2   anastrozole (ARIMIDEX) 1 MG tablet, Take 1 tablet (1 mg total) by mouth daily. Start Apr 29, 2019, Disp: 90 tablet, Rfl: 4   baclofen (LIORESAL) 10 MG tablet, TAKE 1 TABLET BY MOUTH THREE TIMES A  DAY AS NEEDED FOR MUSCLE SPASM, Disp: 270 tablet, Rfl: 0   cetirizine (ZYRTEC) 10 MG tablet, TAKE 1 TABLET BY MOUTH EVERY DAY, Disp: 30 tablet, Rfl: 0   Cholecalciferol (VITAMIN D) 125 MCG (5000 UT) CAPS, Take 5,000 Units by mouth daily., Disp: , Rfl:    COMBIGAN 0.2-0.5 % ophthalmic solution, Place 1 drop into both eyes 2 (two) times daily., Disp: , Rfl:    dapagliflozin propanediol (FARXIGA) 10 MG TABS tablet, Take 1 tablet (10 mg total) by mouth daily before breakfast., Disp: 90 tablet, Rfl: 1   DULoxetine (CYMBALTA) 60 MG capsule, TAKE 1 CAPSULE BY MOUTH EVERY DAY, Disp: 90 capsule, Rfl: 1   famotidine (PEPCID) 20 MG tablet, TAKE 1 TABLET BY MOUTH  TWICE A DAY, Disp: 60 tablet, Rfl: 1   gabapentin (NEURONTIN) 300 MG capsule, Take 3 capsules (900 mg total) by mouth 3 (three) times daily., Disp: 270 capsule, Rfl: 6   latanoprost (XALATAN) 0.005 % ophthalmic solution, Place 1 drop into both eyes at bedtime., Disp: , Rfl:    levothyroxine (SYNTHROID) 50 MCG tablet, TAKE 1 TABLET BY MOUTH MONDAY - FRIDAY ONLY, Disp: 90 tablet, Rfl: 1   levothyroxine (SYNTHROID) 75 MCG tablet, TAKE 1 TABLET BY MOUTH ON SAT - SUNDAY ONLY, Disp: 90 tablet, Rfl: 1   losartan-hydrochlorothiazide (HYZAAR) 50-12.5 MG tablet, TAKE 1 TABLET BY MOUTH EVERY DAY, Disp: 90 tablet, Rfl: 1   metFORMIN (GLUCOPHAGE-XR) 750 MG 24 hr tablet, TAKE 1 TABLET BY MOUTH EVERY DAY, Disp: 90 tablet, Rfl: 1   pravastatin (PRAVACHOL) 80 MG tablet, TAKE ONE TAB BY MOUTH M-F, SKIP SAT/SUN, Disp: 75 tablet, Rfl: 2   Saxagliptin-Metformin 5-500 MG TB24, TAKE 1 TABLET BY MOUTH EVERY DAY BEFORE SUPPER, Disp: 90 tablet, Rfl: 1   Allergies  Allergen Reactions   Cyclobenzaprine Nausea Only      The patient states she uses post menopausal status for birth control. Last LMP was No LMP recorded. Patient is postmenopausal.. Negative for Dysmenorrhea. Negative for: breast discharge, breast lump(s), breast pain and breast self exam. Associated symptoms include abnormal vaginal bleeding. Pertinent negatives include abnormal bleeding (hematology), anxiety, decreased libido, depression, difficulty falling sleep, dyspareunia, history of infertility, nocturia, sexual dysfunction, sleep disturbances, urinary incontinence, urinary urgency, vaginal discharge and vaginal itching. Diet regular.The patient states her exercise level is    . The patient's tobacco use is:  Social History   Tobacco Use  Smoking Status Never  Smokeless Tobacco Never  . She has been exposed to passive smoke. The patient's alcohol use is:  Social History   Substance and Sexual Activity  Alcohol Use No   Review of Systems   Constitutional:  Positive for fatigue.  HENT:  Positive for rhinorrhea.   Eyes: Negative.  Negative for blurred vision.  Respiratory: Negative.    Cardiovascular: Negative.   Gastrointestinal: Negative.   Endocrine: Negative.   Genitourinary: Negative.   Musculoskeletal: Negative.   Skin: Negative.   Allergic/Immunologic: Negative.   Neurological: Negative.   Hematological: Negative.   Psychiatric/Behavioral: Negative.       Today's Vitals   07/01/22 1524  BP: 138/64  Pulse: 91  Temp: 98.4 F (36.9 C)  Weight: 173 lb 12.8 oz (78.8 kg)  Height: 5' 5.2" (1.656 m)  PainSc: 0-No pain   Body mass index is 28.74 kg/m. Wt Readings from Last 3 Encounters:  07/01/22 173 lb 12.8 oz (78.8 kg)  05/11/22 174 lb 12.8 oz (79.3 kg)  04/07/22 169 lb 3.2 oz (76.7  kg)      Objective:  Physical Exam Vitals and nursing note reviewed.  Constitutional:      Appearance: Normal appearance.  HENT:     Head: Normocephalic and atraumatic.     Right Ear: Tympanic membrane, ear canal and external ear normal.     Left Ear: Tympanic membrane, ear canal and external ear normal.     Nose:     Comments: Masked     Mouth/Throat:     Comments: Masked  Eyes:     Extraocular Movements: Extraocular movements intact.     Conjunctiva/sclera: Conjunctivae normal.     Pupils: Pupils are equal, round, and reactive to light.  Cardiovascular:     Rate and Rhythm: Normal rate and regular rhythm.     Pulses: Normal pulses.          Dorsalis pedis pulses are 2+ on the right side and 2+ on the left side.     Heart sounds: Normal heart sounds.  Pulmonary:     Effort: Pulmonary effort is normal.     Breath sounds: Normal breath sounds.  Chest:  Breasts:    Tanner Score is 5.     Left: Normal.     Comments: Healed surgical scar on right breast Abdominal:     General: Abdomen is flat. Bowel sounds are normal.     Palpations: Abdomen is soft.  Genitourinary:    Comments: deferred Musculoskeletal:         General: Normal range of motion.     Cervical back: Normal range of motion and neck supple.  Feet:     Right foot:     Protective Sensation: 5 sites tested.  5 sites sensed.     Skin integrity: Dry skin present.     Toenail Condition: Right toenails are normal.     Left foot:     Protective Sensation: 5 sites tested.  5 sites sensed.     Skin integrity: Dry skin present.     Toenail Condition: Left toenails are normal.  Skin:    General: Skin is warm and dry.  Neurological:     General: No focal deficit present.     Mental Status: She is alert and oriented to person, place, and time.  Psychiatric:        Mood and Affect: Mood normal.        Behavior: Behavior normal.       Assessment And Plan:     1. Encounter for general adult medical examination w/o abnormal findings Comments: A full exam was performed. Importance of monthly self breast exams was discussed with the patient.  PATIENT IS ADVISED TO GET 30-45 MINUTES REGULAR EXERCISE NO LESS THAN FOUR TO FIVE DAYS PER WEEK - BOTH WEIGHTBEARING EXERCISES AND AEROBIC ARE RECOMMENDED.  PATIENT IS ADVISED TO FOLLOW A HEALTHY DIET WITH AT LEAST SIX FRUITS/VEGGIES PER DAY, DECREASE INTAKE OF RED MEAT, AND TO INCREASE FISH INTAKE TO TWO DAYS PER WEEK.  MEATS/FISH SHOULD NOT BE FRIED, BAKED OR BROILED IS PREFERABLE.  IT IS ALSO IMPORTANT TO CUT BACK ON YOUR SUGAR INTAKE. PLEASE AVOID ANYTHING WITH ADDED SUGAR, CORN SYRUP OR OTHER SWEETENERS. IF YOU MUST USE A SWEETENER, YOU CAN TRY STEVIA. IT IS ALSO IMPORTANT TO AVOID ARTIFICIALLY SWEETENERS AND DIET BEVERAGES. LASTLY, I SUGGEST WEARING SPF 50 SUNSCREEN ON EXPOSED PARTS AND ESPECIALLY WHEN IN THE DIRECT SUNLIGHT FOR AN EXTENDED PERIOD OF TIME.  PLEASE AVOID FAST FOOD RESTAURANTS AND INCREASE YOUR WATER INTAKE.  2. Uncontrolled type 2 diabetes mellitus with hyperglycemia (HCC) Comments: Chronic, diabetic foot exam was performed. She will f/u in 4 months for re-evaluation.  I DISCUSSED WITH THE  PATIENT AT LENGTH REGARDING THE GOALS OF GLYCEMIC CONTROL AND POSSIBLE LONG-TERM COMPLICATIONS.  I  ALSO STRESSED THE IMPORTANCE OF COMPLIANCE WITH HOME GLUCOSE MONITORING, DIETARY RESTRICTIONS INCLUDING AVOIDANCE OF SUGARY DRINKS/PROCESSED FOODS,  ALONG WITH REGULAR EXERCISE.  I  ALSO STRESSED THE IMPORTANCE OF ANNUAL EYE EXAMS, SELF FOOT CARE AND COMPLIANCE WITH OFFICE VISITS.  - EKG 12-Lead - CBC - Lipid panel - ALT  3. Hypertensive heart disease without heart failure Comments: Chronic, fair control. Goal BP<130/80. Advised to follow low sodium diet.  EKG performed, NSR w/o acute changes. She will f/u in 4 months for re-evaluation. - CBC - EKG 12-Lead  4. Viral URI with cough Comments: Rapid flu A/B and COVID tests are negative.  She agrees to resp panel.  Advised to stay well hydrated, avoid dairy and take Zyrtec nightly.  - Respiratory Panel w/ SARS-CoV2 - POC Influenza A&B(BINAX/QUICKVUE) - POC COVID-19  5. Atherosclerosis of aorta (HCC) Comments: Chronic, LDL goal < 70.  She is currently on pravastatin, had myalgia w/ high intensity statins.  6. Postoperative hypothyroidism Comments: Chronic, I will check thyroid panel and adjust meds as needed. She will f/u in 6 months, sooner if needed. - TSH - T4  7. Body mass index (BMI) of 28.0 to 28.9 in adult Comments: She is encouraged to aim for at least 150 minutes of exercise/week.   Patient was given opportunity to ask questions. Patient verbalized understanding of the plan and was able to repeat key elements of the plan. All questions were answered to their satisfaction.   I, [name], MD, have reviewed all documentation for this visit. The documentation on 07/01/22 for the exam, diagnosis, procedures, and orders are all accurate and complete.   THE PATIENT IS ENCOURAGED TO PRACTICE SOCIAL DISTANCING DUE TO THE COVID-19 PANDEMIC.

## 2022-07-01 NOTE — Patient Instructions (Signed)

## 2022-07-02 ENCOUNTER — Telehealth: Payer: Self-pay

## 2022-07-02 LAB — POC COVID19 BINAXNOW: SARS Coronavirus 2 Ag: NEGATIVE

## 2022-07-02 LAB — POC INFLUENZA A&B (BINAX/QUICKVUE)
Influenza A, POC: NEGATIVE
Influenza B, POC: NEGATIVE

## 2022-07-02 NOTE — Telephone Encounter (Signed)
Lvm for patient to call to schedule eye exam.

## 2022-07-05 LAB — RESPIRATORY PANEL W/ SARS-COV2

## 2022-07-08 ENCOUNTER — Encounter: Payer: Self-pay | Admitting: Internal Medicine

## 2022-07-26 ENCOUNTER — Other Ambulatory Visit: Payer: Self-pay | Admitting: Internal Medicine

## 2022-07-26 ENCOUNTER — Other Ambulatory Visit: Payer: Self-pay | Admitting: Nurse Practitioner

## 2022-07-26 DIAGNOSIS — E1169 Type 2 diabetes mellitus with other specified complication: Secondary | ICD-10-CM | POA: Insufficient documentation

## 2022-07-28 DIAGNOSIS — G501 Atypical facial pain: Secondary | ICD-10-CM | POA: Diagnosis not present

## 2022-07-28 DIAGNOSIS — G5132 Clonic hemifacial spasm, left: Secondary | ICD-10-CM | POA: Diagnosis not present

## 2022-08-06 ENCOUNTER — Other Ambulatory Visit: Payer: Self-pay

## 2022-09-27 ENCOUNTER — Encounter: Payer: Self-pay | Admitting: Internal Medicine

## 2022-09-27 ENCOUNTER — Ambulatory Visit: Payer: Medicare PPO | Admitting: Internal Medicine

## 2022-09-27 VITALS — BP 130/72 | HR 88 | Temp 98.0°F | Ht 65.0 in | Wt 166.4 lb

## 2022-09-27 DIAGNOSIS — G8929 Other chronic pain: Secondary | ICD-10-CM | POA: Diagnosis not present

## 2022-09-27 DIAGNOSIS — I119 Hypertensive heart disease without heart failure: Secondary | ICD-10-CM | POA: Diagnosis not present

## 2022-09-27 DIAGNOSIS — I700 Atherosclerosis of aorta: Secondary | ICD-10-CM

## 2022-09-27 DIAGNOSIS — E890 Postprocedural hypothyroidism: Secondary | ICD-10-CM

## 2022-09-27 DIAGNOSIS — M25512 Pain in left shoulder: Secondary | ICD-10-CM | POA: Diagnosis not present

## 2022-09-27 DIAGNOSIS — E785 Hyperlipidemia, unspecified: Secondary | ICD-10-CM | POA: Diagnosis not present

## 2022-09-27 DIAGNOSIS — M21611 Bunion of right foot: Secondary | ICD-10-CM | POA: Diagnosis not present

## 2022-09-27 DIAGNOSIS — E1169 Type 2 diabetes mellitus with other specified complication: Secondary | ICD-10-CM | POA: Diagnosis not present

## 2022-09-27 DIAGNOSIS — Z23 Encounter for immunization: Secondary | ICD-10-CM

## 2022-09-27 DIAGNOSIS — M21619 Bunion of unspecified foot: Secondary | ICD-10-CM

## 2022-09-27 DIAGNOSIS — M21612 Bunion of left foot: Secondary | ICD-10-CM | POA: Diagnosis not present

## 2022-09-27 DIAGNOSIS — C50011 Malignant neoplasm of nipple and areola, right female breast: Secondary | ICD-10-CM

## 2022-09-27 MED ORDER — PRAVASTATIN SODIUM 80 MG PO TABS
ORAL_TABLET | ORAL | 2 refills | Status: DC
Start: 2022-09-27 — End: 2023-06-02

## 2022-09-27 NOTE — Progress Notes (Signed)
I,[name],acting as a scribe for [name], MD.,have documented all relevant documentation on the behalf of [name], MD,as directed by  [name], MD while in the presence of [name], MD.    Subjective:     Patient ID: Christina Hernandez , female    DOB: 1950/09/24 , 72 y.o.   MRN: [id number]   Chief Complaint  Patient presents with   Diabetes   Hypertension   Hypothyroidism    HPI  Patient presents today for a DM and BP check. Patient reports compliance with medications. She denies having any headaches, chest pain and shortness of breath. She has no new concerns at this time.   Patient would like to know when she can receive her Covid shot after receiving a Shingrix dose today.        Diabetes She presents for her follow-up diabetic visit. She has type 2 diabetes mellitus. There are no hypoglycemic associated symptoms. There are no diabetic associated symptoms. Pertinent negatives for diabetes include no blurred vision and no chest pain. There are no hypoglycemic complications. Risk factors for coronary artery disease include diabetes mellitus, dyslipidemia, hypertension, sedentary lifestyle and post-menopausal. She participates in exercise intermittently. An ACE inhibitor/angiotensin II receptor blocker is being taken. Eye exam is current.  Hypertension This is a chronic problem. The current episode started more than 1 year ago. The problem has been gradually improving since onset. The problem is controlled. Pertinent negatives include no blurred vision, chest pain, palpitations or shortness of breath. Risk factors for coronary artery disease include diabetes mellitus, dyslipidemia, post-menopausal state and sedentary lifestyle. The current treatment provides moderate improvement. Compliance problems include exercise.      Past Medical History:  Diagnosis Date   Arthritis    Breast cancer, right breast    pending right breast  lumpectomy on September 01, 2018   Diabetes mellitus type II, controlled    On Kombiglyze   Family history of ovarian cancer    Family history of thyroid cancer    GERD (gastroesophageal reflux disease)    rarely occurs- no meds   Hyperlipidemia associated with type 2 diabetes mellitus    Hypertension    Hypothyroidism    Personal history of radiation therapy    Primary localized osteoarthritis of right knee 04/17/2019   S/P knee replacement 04/17/2019   Trigeminal neuralgia of left side of face 2015   s/p Gamma knife     Family History  Problem Relation Age of Onset   Hypertension Mother    Kidney failure Mother    Congestive Heart Failure Mother    Heart Problems Father    Stroke Father    Pneumonia Father    Heart Problems Sister 28       heart transplant   Stroke Paternal Grandmother    Dementia Paternal Grandmother    Stroke Paternal Grandfather    Cardiomyopathy Sister    Ovarian cancer Sister 36       d. 73   Thyroid disease Sister    Thyroid disease Sister    Thyroid cancer Niece 64   Breast cancer Neg Hx      Current Outpatient Medications:    Accu-Chek FastClix Lancets MISC, Use as instructed to check blood sugars 1 time per day dx: e11.22, Disp: 150 each, Rfl: 2   anastrozole (ARIMIDEX) 1 MG tablet, Take 1 tablet (1 mg total) by mouth daily. Start Apr 29, 2019, Disp: 90 tablet, Rfl: 4  baclofen (LIORESAL) 10 MG tablet, TAKE 1 TABLET BY MOUTH THREE TIMES A DAY AS NEEDED FOR MUSCLE SPASM, Disp: 270 tablet, Rfl: 0   cetirizine (ZYRTEC) 10 MG tablet, TAKE 1 TABLET BY MOUTH EVERY DAY, Disp: 30 tablet, Rfl: 0   Cholecalciferol (VITAMIN D) 125 MCG (5000 UT) CAPS, Take 5,000 Units by mouth daily., Disp: , Rfl:    COMBIGAN 0.2-0.5 % ophthalmic solution, Place 1 drop into both eyes 2 (two) times daily., Disp: , Rfl:    dapagliflozin propanediol (FARXIGA) 10 MG TABS tablet, Take 1 tablet (10 mg total) by mouth daily before breakfast., Disp: 90 tablet, Rfl: 1   DULoxetine  (CYMBALTA) 60 MG capsule, TAKE 1 CAPSULE BY MOUTH EVERY DAY (Patient taking differently: TAKE 2 CAPSULE BY MOUTH EVERY DAY. Patient reports once in the morning & once in the evening.), Disp: 90 capsule, Rfl: 1   famotidine (PEPCID) 20 MG tablet, TAKE 1 TABLET BY MOUTH TWICE A DAY, Disp: 60 tablet, Rfl: 1   gabapentin (NEURONTIN) 300 MG capsule, TAKE 3 CAPSULES BY MOUTH 3 TIMES DAILY., Disp: 270 capsule, Rfl: 3   latanoprost (XALATAN) 0.005 % ophthalmic solution, Place 1 drop into both eyes at bedtime., Disp: , Rfl:    levothyroxine (SYNTHROID) 50 MCG tablet, TAKE 1 TABLET BY MOUTH MONDAY - FRIDAY ONLY, Disp: 90 tablet, Rfl: 1   levothyroxine (SYNTHROID) 75 MCG tablet, TAKE 1 TABLET BY MOUTH ON SAT - SUNDAY ONLY, Disp: 90 tablet, Rfl: 1   losartan-hydrochlorothiazide (HYZAAR) 50-12.5 MG tablet, TAKE 1 TABLET BY MOUTH EVERY DAY, Disp: 90 tablet, Rfl: 1   metFORMIN (GLUCOPHAGE-XR) 750 MG 24 hr tablet, TAKE 1 TABLET BY MOUTH EVERY DAY, Disp: 90 tablet, Rfl: 1   Saxagliptin-Metformin 5-500 MG TB24, TAKE 1 TABLET BY MOUTH EVERY DAY BEFORE SUPPER, Disp: 90 tablet, Rfl: 1   pravastatin (PRAVACHOL) 80 MG tablet, One tab po qd, skip Sundays, Disp: 75 tablet, Rfl: 2   Allergies  Allergen Reactions   Cyclobenzaprine Nausea Only     Review of Systems  Constitutional: Negative.   Eyes:  Negative for blurred vision.  Respiratory: Negative.  Negative for shortness of breath.   Cardiovascular: Negative.  Negative for chest pain and palpitations.  Gastrointestinal: Negative.   Musculoskeletal:  Positive for arthralgias.       She c/o left shoulder pain. Started about 3 months ago. Denies fall/trauma. Described as dull,throbbing. Not able to move shoulder as she has in the past.   Skin: Negative.   Neurological: Negative.   Psychiatric/Behavioral: Negative.       Today's Vitals   09/27/22 1513 09/27/22 1538  BP: (!) 140/78 130/72  Pulse: 88   Temp: 98 F (36.7 C)   SpO2: 98%   Weight: 166 lb 6.4 oz  (75.5 kg)   Height: 5\' 5"  (1.651 m)    Body mass index is 27.69 kg/m. Wt Readings from Last 3 Encounters:  09/27/22 166 lb 6.4 oz (75.5 kg)  07/01/22 173 lb 12.8 oz (78.8 kg)  05/11/22 174 lb 12.8 oz (79.3 kg)    Objective:  Physical Exam Vitals and nursing note reviewed.  Constitutional:      Appearance: Normal appearance.  HENT:     Head: Normocephalic and atraumatic.     Nose:     Comments: Masked     Mouth/Throat:     Comments: Masked  Eyes:     Extraocular Movements: Extraocular movements intact.  Cardiovascular:     Rate and Rhythm: Normal rate and  regular rhythm.     Heart sounds: Normal heart sounds.  Pulmonary:     Effort: Pulmonary effort is normal.     Breath sounds: Normal breath sounds.  Musculoskeletal:        General: Tenderness present.     Cervical back: Normal range of motion.     Right foot: Bunion present.     Left foot: Bunion present.     Comments: Decreased ROM L shoulder  Skin:    General: Skin is warm.  Neurological:     General: No focal deficit present.     Mental Status: She is alert.  Psychiatric:        Mood and Affect: Mood normal.        Behavior: Behavior normal.         Assessment And Plan:     1. Type 2 diabetes mellitus with hyperlipidemia Comments: Chronic, LDL goal < 70. She will c/w pravastatin and Farxiga/metformin daily. She is encouraged to avoid sugary beverages/foods. - CMP14+EGFR - CBC - Hemoglobin A1c - pravastatin (PRAVACHOL) 80 MG tablet; One tab po qd, skip Sundays  Dispense: 75 tablet; Refill: 2  2. Hypertensive heart disease without heart failure Comments: Chronic, fair control. Goal BP<120/80. She will c/w losartan/hct 50/12.5mg   3. Atherosclerosis of aorta Comments: Chronic, LDL goal < 70.  She will c/w pravastatin 80mg  daily. She has not tolerated high intensity statins in the past.  4. Postoperative hypothyroidism Comments: She is s/p total thyroidectomy. I will check thyroid panel and adjust meds as  needed. - TSH + free T4  5. Chronic left shoulder pain Comments: Sx suggestive of adhesive capsulitis. I will refer her to Ortho for radiographic studies and further evaluation. - Ambulatory referral to Orthopedic Surgery  6. Bunion of great toe Comments: I will refer her to Podiatry as requested. - Ambulatory referral to Podiatry  7. Malignant neoplasm of nipple of right breast in female, unspecified estrogen receptor status Comments: She is s/p R lumpectomy and XRT. She is now on anastrozole and is anticipated to stay on this for  years, started in 2020.  8. Need for shingles vaccine Comments: She was given Shingrix IM x 1, billed via [employer] Advised she can get COVID vaccine in 2 weeks or so. - Zoster Recombinant (Shingrix )   Patient was given opportunity to ask questions. Patient verbalized understanding of the plan and was able to repeat key elements of the plan. All questions were answered to their satisfaction.   I, [name], MD, have reviewed all documentation for this visit. The documentation on 09/27/22 for the exam, diagnosis, procedures, and orders are all accurate and complete.   IF YOU HAVE BEEN REFERRED TO A SPECIALIST, IT MAY TAKE 1-2 WEEKS TO SCHEDULE/PROCESS THE REFERRAL. IF YOU HAVE NOT HEARD FROM US/SPECIALIST IN TWO WEEKS, PLEASE GIVE [place] A CALL AT [phone].   THE PATIENT IS ENCOURAGED TO PRACTICE SOCIAL DISTANCING DUE TO THE COVID-19 PANDEMIC.

## 2022-09-27 NOTE — Patient Instructions (Signed)

## 2022-09-28 LAB — CMP14+EGFR
ALT: 21 IU/L (ref 0–32)
AST: 20 IU/L (ref 0–40)
Albumin/Globulin Ratio: 1.6 (ref 1.2–2.2)
Albumin: 4.3 g/dL (ref 3.8–4.8)
Alkaline Phosphatase: 101 IU/L (ref 44–121)
BUN/Creatinine Ratio: 18 (ref 12–28)
BUN: 16 mg/dL (ref 8–27)
Bilirubin Total: 0.5 mg/dL (ref 0.0–1.2)
CO2: 24 mmol/L (ref 20–29)
Calcium: 9.4 mg/dL (ref 8.7–10.3)
Chloride: 103 mmol/L (ref 96–106)
Creatinine, Ser: 0.87 mg/dL (ref 0.57–1.00)
Globulin, Total: 2.7 g/dL (ref 1.5–4.5)
Glucose: 142 mg/dL — ABNORMAL HIGH (ref 70–99)
Potassium: 3.7 mmol/L (ref 3.5–5.2)
Sodium: 144 mmol/L (ref 134–144)
Total Protein: 7 g/dL (ref 6.0–8.5)
eGFR: 70 mL/min/{1.73_m2} (ref >59)

## 2022-09-28 LAB — TSH+FREE T4
Free T4: 1.22 ng/dL (ref 0.82–1.77)
TSH: 0.089 u[IU]/mL — ABNORMAL LOW (ref 0.450–4.500)

## 2022-09-28 LAB — CBC
Hematocrit: 39.5 % (ref 34.0–46.6)
Hemoglobin: 13.1 g/dL (ref 11.1–15.9)
MCH: 26.8 pg (ref 26.6–33.0)
MCHC: 33.2 g/dL (ref 31.5–35.7)
MCV: 81 fL (ref 79–97)
Platelets: 273 10*3/uL (ref 150–450)
RBC: 4.88 x10E6/uL (ref 3.77–5.28)
RDW: 13.4 % (ref 11.7–15.4)
WBC: 5.7 10*3/uL (ref 3.4–10.8)

## 2022-09-28 LAB — HEMOGLOBIN A1C
Est. average glucose Bld gHb Est-mCnc: 134 mg/dL
Hgb A1c MFr Bld: 6.3 % — ABNORMAL HIGH (ref 4.8–5.6)

## 2022-10-04 ENCOUNTER — Other Ambulatory Visit: Payer: Self-pay

## 2022-10-04 DIAGNOSIS — E039 Hypothyroidism, unspecified: Secondary | ICD-10-CM

## 2022-10-05 ENCOUNTER — Ambulatory Visit: Payer: Medicare PPO

## 2022-10-07 ENCOUNTER — Ambulatory Visit: Payer: Medicare PPO

## 2022-10-14 ENCOUNTER — Ambulatory Visit: Payer: Medicare PPO | Admitting: Podiatry

## 2022-10-14 DIAGNOSIS — M2010 Hallux valgus (acquired), unspecified foot: Secondary | ICD-10-CM

## 2022-10-15 ENCOUNTER — Other Ambulatory Visit: Payer: Self-pay | Admitting: Internal Medicine

## 2022-10-15 DIAGNOSIS — E890 Postprocedural hypothyroidism: Secondary | ICD-10-CM

## 2022-10-19 ENCOUNTER — Ambulatory Visit: Payer: Medicare PPO

## 2022-10-19 ENCOUNTER — Other Ambulatory Visit: Payer: Self-pay

## 2022-10-19 VITALS — BP 138/68 | HR 98 | Temp 98.5°F | Ht 65.0 in | Wt 166.0 lb

## 2022-10-19 DIAGNOSIS — I119 Hypertensive heart disease without heart failure: Secondary | ICD-10-CM

## 2022-10-19 DIAGNOSIS — E1169 Type 2 diabetes mellitus with other specified complication: Secondary | ICD-10-CM

## 2022-10-19 DIAGNOSIS — E039 Hypothyroidism, unspecified: Secondary | ICD-10-CM

## 2022-10-19 MED ORDER — CONTOUR NEXT TEST VI STRP: ORAL_STRIP | 2 refills | Status: DC

## 2022-10-19 NOTE — Progress Notes (Signed)
Patient presents today for a medication check, patient was directed to bring in all medications to see if they match. All patients medications matched the medications in her chart.

## 2022-10-26 ENCOUNTER — Ambulatory Visit: Payer: Medicare PPO | Admitting: Podiatry

## 2022-10-26 ENCOUNTER — Ambulatory Visit (INDEPENDENT_AMBULATORY_CARE_PROVIDER_SITE_OTHER): Payer: Medicare PPO

## 2022-10-26 ENCOUNTER — Ambulatory Visit: Payer: Medicare PPO

## 2022-10-26 DIAGNOSIS — M2010 Hallux valgus (acquired), unspecified foot: Secondary | ICD-10-CM | POA: Diagnosis not present

## 2022-10-26 DIAGNOSIS — M2012 Hallux valgus (acquired), left foot: Secondary | ICD-10-CM | POA: Diagnosis not present

## 2022-10-26 DIAGNOSIS — M2011 Hallux valgus (acquired), right foot: Secondary | ICD-10-CM

## 2022-10-26 NOTE — Progress Notes (Signed)
Subjective:  Patient ID: Christina Hernandez, female    DOB: [birth year],  MRN: [id number] HPI Chief Complaint  Patient presents with   Bunions    Bilateral bunion pain on going for years. Patient states she cannot wear shoes because it is uncomfortable.     72 y.o. female presents with the above complaint.   ROS: Denies fever chills nausea vomit muscle aches pains calf pain back pain chest pain shortness of breath.  Past Medical History:  Diagnosis Date   Arthritis    Breast cancer, right breast (HCC)    pending right breast lumpectomy on September 01, 2018   Diabetes mellitus type II, controlled (HCC)    On Kombiglyze   Family history of ovarian cancer    Family history of thyroid cancer    GERD (gastroesophageal reflux disease)    rarely occurs- no meds   Hyperlipidemia associated with type 2 diabetes mellitus (HCC)    Hypertension    Hypothyroidism    Personal history of radiation therapy    Primary localized osteoarthritis of right knee 04/17/2019   S/P knee replacement 04/17/2019   Trigeminal neuralgia of left side of face 2015   s/p Gamma knife   Past Surgical History:  Procedure Laterality Date   BREAST BIOPSY Right 07/24/2018   BREAST LUMPECTOMY Right 09/01/2018   BREAST LUMPECTOMY WITH RADIOACTIVE SEED LOCALIZATION Right 09/01/2018   Procedure: RIGHT BREAST LUMPECTOMY WITH RADIOACTIVE SEED LOCALIZATION;  Surgeon: Ovidio Kin, MD;  Location: [facility];  Service: General;  Laterality: Right;   COLONOSCOPY     Gamma Knife Left 04/08/2016   HYSTEROSCOPY WITH D & C  05/24/2011   Procedure: DILATATION AND CURETTAGE (D&C) /HYSTEROSCOPY;  Surgeon: Geryl Rankins, MD;  Location: [facility];  Service: Gynecology;  Laterality: N/A;   KNEE SURGERY Right 06/10/15   torn ligament   PARTIAL KNEE ARTHROPLASTY Right 04/17/2019   Procedure: UNICOMPARTMENTAL KNEE;  Surgeon: Teryl Lucy, MD;  Location: [facility];  Service: Orthopedics;  Laterality: Right;    THYROIDECTOMY N/A 09/08/2016   Procedure: TOTAL THYROIDECTOMY;  Surgeon: Newman Pies, MD;  Location: [facility];  Service: ENT;  Laterality: N/A;   TUBAL LIGATION     uterine polyp     WISDOM TOOTH EXTRACTION Left 2014    Current Outpatient Medications:    Accu-Chek FastClix Lancets MISC, Use as instructed to check blood sugars 1 time per day dx: e11.22 (Patient not taking: Reported on 10/19/2022), Disp: 150 each, Rfl: 2   anastrozole (ARIMIDEX) 1 MG tablet, Take 1 tablet (1 mg total) by mouth daily. Start Apr 29, 2019, Disp: 90 tablet, Rfl: 4   baclofen (LIORESAL) 10 MG tablet, TAKE 1 TABLET BY MOUTH THREE TIMES A DAY AS NEEDED FOR MUSCLE SPASM, Disp: 270 tablet, Rfl: 0   cetirizine (ZYRTEC) 10 MG tablet, TAKE 1 TABLET BY MOUTH EVERY DAY, Disp: 30 tablet, Rfl: 0   Cholecalciferol (VITAMIN D) 125 MCG (5000 UT) CAPS, Take 5,000 Units by mouth daily., Disp: , Rfl:    COMBIGAN 0.2-0.5 % ophthalmic solution, Place 1 drop into both eyes 2 (two) times daily., Disp: , Rfl:    dapagliflozin propanediol (FARXIGA) 10 MG TABS tablet, Take 1 tablet (10 mg total) by mouth daily before breakfast., Disp: 90 tablet, Rfl: 1   DULoxetine (CYMBALTA) 60 MG capsule, TAKE 1 CAPSULE BY MOUTH EVERY DAY (Patient taking differently: TAKE 2 CAPSULE BY MOUTH EVERY DAY. Patient reports once in the morning & once in the evening.), Disp: 90 capsule,  Rfl: 1   famotidine (PEPCID) 20 MG tablet, TAKE 1 TABLET BY MOUTH TWICE A DAY, Disp: 60 tablet, Rfl: 1   gabapentin (NEURONTIN) 300 MG capsule, TAKE 3 CAPSULES BY MOUTH 3 TIMES DAILY., Disp: 270 capsule, Rfl: 3   glucose blood (CONTOUR NEXT TEST) test strip, Use daily to check blood sugars., Disp: 100 each, Rfl: 2   latanoprost (XALATAN) 0.005 % ophthalmic solution, Place 1 drop into both eyes at bedtime., Disp: , Rfl:    levothyroxine (SYNTHROID) 50 MCG tablet, TAKE 1 TABLET BY MOUTH MONDAY - FRIDAY ONLY, Disp: 90 tablet, Rfl: 1   levothyroxine (SYNTHROID) 75 MCG tablet, Take 1 tablet (75  mcg total) by mouth once a week. SUNDAY ONLY, Disp: 12 tablet, Rfl: 1   losartan-hydrochlorothiazide (HYZAAR) 50-12.5 MG tablet, TAKE 1 TABLET BY MOUTH EVERY DAY, Disp: 90 tablet, Rfl: 1   metFORMIN (GLUCOPHAGE-XR) 750 MG 24 hr tablet, TAKE 1 TABLET BY MOUTH EVERY DAY, Disp: 90 tablet, Rfl: 1   pravastatin (PRAVACHOL) 80 MG tablet, One tab po qd, skip Sundays, Disp: 75 tablet, Rfl: 2   Saxagliptin-Metformin 5-500 MG TB24, TAKE 1 TABLET BY MOUTH EVERY DAY BEFORE SUPPER (Patient not taking: Reported on 10/19/2022), Disp: 90 tablet, Rfl: 1  Allergies  Allergen Reactions   Cyclobenzaprine Nausea Only   Review of Systems Objective:  There were no vitals filed for this visit.  General: Well developed, nourished, in no acute distress, alert and oriented x3   Dermatological: Skin is warm, dry and supple bilateral. Nails x 10 are well maintained; remaining integument appears unremarkable at this time. There are no open sores, no preulcerative lesions, no rash or signs of infection present.  Vascular: Dorsalis Pedis artery and Posterior Tibial artery pedal pulses are 2/4 bilateral with immedate capillary fill time. Pedal hair growth present. No varicosities and no lower extremity edema present bilateral.   Neruologic: Grossly intact via light touch bilateral. Vibratory intact via tuning fork bilateral. Protective threshold with Semmes Wienstein monofilament intact to all pedal sites bilateral. Patellar and Achilles deep tendon reflexes 2+ bilateral. No Babinski or clonus noted bilateral.   Musculoskeletal: No gross boney pedal deformities bilateral. No pain, crepitus, or limitation noted with foot and ankle range of motion bilateral. Muscular strength 5/5 in all groups tested bilateral.  Hallux abductovalgus deformity of the left foot greater than the right with mild pes planus.  She has no crepitation on range of motion and no hypermobility of the first tarsometatarsal joint of either foot.  There is no  erythema edema cellulitis drainage or odor just tenderness on palpation of the medial condyle.    Gait: Unassisted, Nonantalgic.    Radiographs:  Radiographs taken today demonstrate osseously mature individual with mild demineralization throughout both feet.  Mild pes planus is noted bilateral increase in the first intermetatarsal angle and hallux abductus angle greater than normal values bilaterally.  Joint space narrowing is also noted bilaterally left greater than right with some dorsal spurring at the head of the metatarsal.  This consistent with hallux valgus and mild hallux limitus.  Assessment & Plan:   Assessment: Hallux abductovalgus deformity mild pes planovalgus and mild hallux limitus left greater than right.  Plan: Discussed etiology pathology conservative surgical therapies at this point she would like to consider topical therapies until about September or October for surgical intervention regarding the left foot.  She would like to go ahead and have that done at that time so we will have her back in October for  a fall surgery.     Nicolas Sisler T. [place], [place]

## 2022-10-28 ENCOUNTER — Ambulatory Visit
Admission: RE | Admit: 2022-10-28 | Discharge: 2022-10-28 | Disposition: A | Discharge status: Home / Self Care | Payer: Medicare PPO | Source: Ambulatory Visit | Attending: Hematology and Oncology | Admitting: Hematology and Oncology

## 2022-10-28 DIAGNOSIS — Z923 Personal history of irradiation: Secondary | ICD-10-CM | POA: Diagnosis not present

## 2022-10-28 DIAGNOSIS — D0511 Intraductal carcinoma in situ of right breast: Secondary | ICD-10-CM

## 2022-10-28 DIAGNOSIS — C50011 Malignant neoplasm of nipple and areola, right female breast: Secondary | ICD-10-CM

## 2022-10-28 DIAGNOSIS — Z853 Personal history of malignant neoplasm of breast: Secondary | ICD-10-CM | POA: Diagnosis not present

## 2022-11-03 DIAGNOSIS — M7532 Calcific tendinitis of left shoulder: Secondary | ICD-10-CM | POA: Diagnosis not present

## 2022-11-05 ENCOUNTER — Telehealth: Payer: Self-pay

## 2022-11-05 NOTE — Telephone Encounter (Signed)
Prior auth for Contour Next strips has been submitted through covermymeds. We are waiting on the determination. YL,RMA

## 2022-11-09 ENCOUNTER — Encounter (HOSPITAL_BASED_OUTPATIENT_CLINIC_OR_DEPARTMENT_OTHER): Payer: Self-pay | Admitting: Emergency Medicine

## 2022-11-09 ENCOUNTER — Other Ambulatory Visit: Payer: Self-pay

## 2022-11-09 ENCOUNTER — Emergency Department (HOSPITAL_BASED_OUTPATIENT_CLINIC_OR_DEPARTMENT_OTHER)
Admission: EM | Admit: 2022-11-09 | Discharge: 2022-11-09 | Disposition: A | Discharge status: Home / Self Care | Payer: Medicare PPO | Attending: Emergency Medicine | Admitting: Emergency Medicine

## 2022-11-09 DIAGNOSIS — M25512 Pain in left shoulder: Secondary | ICD-10-CM | POA: Insufficient documentation

## 2022-11-09 DIAGNOSIS — G8929 Other chronic pain: Secondary | ICD-10-CM | POA: Diagnosis not present

## 2022-11-09 MED ORDER — HYDROCODONE-ACETAMINOPHEN 5-325 MG PO TABS: 1.0000 | ORAL_TABLET | Freq: Four times a day (QID) | ORAL | 0 refills | Status: DC | PRN

## 2022-11-09 MED ORDER — HYDROCODONE-ACETAMINOPHEN 5-325 MG PO TABS
1.0000 | ORAL_TABLET | Freq: Once | ORAL | Status: AC
  Administered 2022-11-09: 1 via ORAL
  Filled 2022-11-09: qty 1

## 2022-11-09 NOTE — ED Notes (Signed)
Pt. In the restroom. 

## 2022-11-09 NOTE — ED Triage Notes (Signed)
Pt c/o chronic left arm pain x 2 weeks. Pt had cortisone shot in left shoulder last Thursday. States that the pain has not gotten any better.

## 2022-11-09 NOTE — ED Provider Notes (Signed)
[place] EMERGENCY DEPARTMENT AT [facility]  Provider Note  CSN: [id number] Arrival date & time: 11/09/22 4098  History Chief Complaint  Patient presents with   Arm Pain    Christina Hernandez is a 72 y.o. female here for evaluation of persistent L shoulder pain, ongoing for about 3-4 weeks. Saw Dr. Dion Saucier last week and had an xray she reports showed calcifications of the joint. She had a steroid injection into the joint but has not had any improvement. Pain was worse tonight and she couldn't sleep prompting her to come to the ED. No falls or injuries. She is on Baclofen and Gabapentin for trigeminal neuralgia.    Home Medications Prior to Admission medications   Medication Sig Start Date End Date Taking? Authorizing Provider  HYDROcodone-acetaminophen (NORCO/VICODIN) 5-325 MG tablet Take 1 tablet by mouth every 6 (six) hours as needed for severe pain. 11/09/22  Yes Pollyann Savoy, MD  Accu-Chek FastClix Lancets MISC Use as instructed to check blood sugars 1 time per day dx: e11.22 Patient not taking: Reported on 10/19/2022 09/17/19   Dorothyann Peng, MD  anastrozole (ARIMIDEX) 1 MG tablet Take 1 tablet (1 mg total) by mouth daily. Start Apr 29, 2019 04/05/22   Rachel Moulds, MD  baclofen (LIORESAL) 10 MG tablet TAKE 1 TABLET BY MOUTH THREE TIMES A DAY AS NEEDED FOR MUSCLE SPASM 07/27/22   Dorothyann Peng, MD  cetirizine (ZYRTEC) 10 MG tablet TAKE 1 TABLET BY MOUTH EVERY DAY 10/19/21   Dorothyann Peng, MD  Cholecalciferol (VITAMIN D) 125 MCG (5000 UT) CAPS Take 5,000 Units by mouth daily.    [provider]  COMBIGAN 0.2-0.5 % ophthalmic solution Place 1 drop into both eyes 2 (two) times daily. 03/15/19   [provider]  dapagliflozin propanediol (FARXIGA) 10 MG TABS tablet Take 1 tablet (10 mg total) by mouth daily before breakfast. 05/11/22   Dorothyann Peng, MD  DULoxetine (CYMBALTA) 60 MG capsule TAKE 1 CAPSULE BY MOUTH EVERY DAY Patient taking  differently: TAKE 2 CAPSULE BY MOUTH EVERY DAY. Patient reports once in the morning & once in the evening. 07/27/22   Dorothyann Peng, MD  famotidine (PEPCID) 20 MG tablet TAKE 1 TABLET BY MOUTH TWICE A DAY 10/19/21   Dorothyann Peng, MD  gabapentin (NEURONTIN) 300 MG capsule TAKE 3 CAPSULES BY MOUTH 3 TIMES DAILY. 07/27/22   Dorothyann Peng, MD  glucose blood (CONTOUR NEXT TEST) test strip Use daily to check blood sugars. 10/19/22   Dorothyann Peng, MD  latanoprost (XALATAN) 0.005 % ophthalmic solution Place 1 drop into both eyes at bedtime. 04/01/19   [provider]  levothyroxine (SYNTHROID) 50 MCG tablet TAKE 1 TABLET BY MOUTH MONDAY - FRIDAY ONLY 02/23/22   Dorothyann Peng, MD  levothyroxine (SYNTHROID) 75 MCG tablet Take 1 tablet (75 mcg total) by mouth once a week. SUNDAY ONLY 10/18/22   Dorothyann Peng, MD  losartan-hydrochlorothiazide [facility]) 50-12.5 MG tablet TAKE 1 TABLET BY MOUTH EVERY DAY 10/18/22   Dorothyann Peng, MD  metFORMIN (GLUCOPHAGE-XR) 750 MG 24 hr tablet TAKE 1 TABLET BY MOUTH EVERY DAY 05/04/22   Dorothyann Peng, MD  pravastatin (PRAVACHOL) 80 MG tablet One tab po qd, skip Sundays 09/27/22   Dorothyann Peng, MD  Saxagliptin-Metformin 5-500 MG TB24 TAKE 1 TABLET BY MOUTH EVERY DAY BEFORE SUPPER Patient not taking: Reported on 10/19/2022 05/04/22   Dorothyann Peng, MD     Allergies    Cyclobenzaprine   Review of Systems   Review of Systems  Please see HPI for pertinent positives and negatives  Physical Exam BP (!) 150/98   Pulse 96   Temp 98.5 F (36.9 C)   Resp 18   Ht 5\' 5"  (1.651 m)   Wt 75 kg   SpO2 97%   BMI 27.51 kg/m   Physical Exam Vitals and nursing note reviewed.  HENT:     Head: Normocephalic.     Nose: Nose normal.  Eyes:     Extraocular Movements: Extraocular movements intact.  Pulmonary:     Effort: Pulmonary effort is normal.  Musculoskeletal:        General: Tenderness (tenderness to light touch of diffuse L should) present. No swelling or  deformity.     Cervical back: Neck supple.     Comments: ROM decreased due to pain  Skin:    Findings: No rash (on exposed skin).  Neurological:     Mental Status: She is alert and oriented to person, place, and time.  Psychiatric:        Mood and Affect: Mood normal.     ED Results / Procedures / Treatments   EKG None  Procedures Procedures  Medications Ordered in the ED Medications  HYDROcodone-acetaminophen (NORCO/VICODIN) 5-325 MG per tablet 1 tablet (has no administration in time range)    Initial Impression and Plan  Patient here for chronic L shoulder pain, followed by Ortho. Not improved with steroid injection. Will give a sling for comfort, advised to remove and ROM shoulder as much as possible to avoid a frozen shoulder. Norco for pain until she can see Dr. Dion Saucier for re-evaluation.   ED Course       MDM Rules/Calculators/A&P Medical Decision Making Problems Addressed: Chronic left shoulder pain: chronic illness or injury with exacerbation, progression, or side effects of treatment  Risk Prescription drug management.     Final Clinical Impression(s) / ED Diagnoses Final diagnoses:  Chronic left shoulder pain    Rx / DC Orders ED Discharge Orders          Ordered    HYDROcodone-acetaminophen (NORCO/VICODIN) 5-325 MG tablet  Every 6 hours PRN        11/09/22 0620             Pollyann Savoy, MD 11/09/22 [phone]

## 2022-11-23 ENCOUNTER — Encounter: Payer: Self-pay | Admitting: Hematology and Oncology

## 2022-11-23 ENCOUNTER — Other Ambulatory Visit: Payer: Self-pay

## 2022-11-23 ENCOUNTER — Inpatient Hospital Stay: Payer: Medicare PPO | Attending: Hematology and Oncology | Admitting: Hematology and Oncology

## 2022-11-23 VITALS — BP 130/67 | HR 100 | Temp 98.2°F | Resp 16 | Wt 160.9 lb

## 2022-11-23 DIAGNOSIS — Z79811 Long term (current) use of aromatase inhibitors: Secondary | ICD-10-CM | POA: Diagnosis not present

## 2022-11-23 DIAGNOSIS — Z170 Estrogen receptor positive status [ER+]: Secondary | ICD-10-CM | POA: Insufficient documentation

## 2022-11-23 DIAGNOSIS — C50911 Malignant neoplasm of unspecified site of right female breast: Secondary | ICD-10-CM | POA: Insufficient documentation

## 2022-11-23 DIAGNOSIS — C50011 Malignant neoplasm of nipple and areola, right female breast: Secondary | ICD-10-CM

## 2022-11-23 DIAGNOSIS — Z923 Personal history of irradiation: Secondary | ICD-10-CM | POA: Diagnosis not present

## 2022-11-23 NOTE — Progress Notes (Signed)
[facility]  Telephone:(336) [phone] Fax:(336) [phone]     ID: Christina Hernandez DOB: [birth year]  MR#: [id number]  JYN#:[id number]  Patient Care Team: Dorothyann Peng, MD as PCP - General (Internal Medicine) Marykay Lex, MD as PCP - Cardiology (Cardiology) Geryl Rankins, MD as Consulting Physician (Obstetrics and Gynecology) Ovidio Kin, MD as Consulting Physician (General Surgery) Magrinat, Valentino Hue, MD (Inactive) as Consulting Physician (Oncology) Dorothy Puffer, MD as Consulting Physician (Radiation Oncology) Marjory Lies, Glenford Bayley, MD as Consulting Physician (Neurology) Chilton Greathouse, MD as Consulting Physician (Pulmonary Disease) Teryl Lucy, MD as Consulting Physician (Orthopedic Surgery) Odette Fraction, MD (Inactive) as Consulting Physician (Anesthesiology) Chalmers Guest, MD as Consulting Physician (Ophthalmology) Rachel Moulds, MD   CHIEF COMPLAINT: Estrogen receptor positive breast cancer  CURRENT TREATMENT: Anastrozole  INTERVAL HISTORY:  [name] returns today for follow up of her noninvasive breast cancer. She started on anastrozole on 04/29/2019.  She is tolerating this well, with occasional hot flashes (which she was not having before). Mammogram May 2024 with no evidence of malignancy. She also underwent bone density screening on 10/18/2020 showing a T-score of +0.2, which is considered normal.  She is due for another one. She hasn't been walking regularly.  HISTORY OF CURRENT ILLNESS:  Oncology History  Ductal carcinoma in situ (DCIS) of right breast  07/24/2018 Initial Diagnosis   [place] woman status post right breast biopsy for ductal carcinoma in situ, grade 1, strongly estrogen and progesterone receptor positive.   08/27/2018 Genetic Testing   Negative genetic testing on the STAT panel and the common hereditary cancer gene panel.  The Hereditary Gene Panel offered by [facility] includes sequencing and/or deletion duplication  testing of the following 47 genes: APC, ATM, AXIN2, BARD1, BMPR1A, BRCA1, BRCA2, BRIP1, CDH1, CDK4, CDKN2A (p14ARF), CDKN2A (p16INK4a), CHEK2, CTNNA1, DICER1, EPCAM (Deletion/duplication testing only), GREM1 (promoter region deletion/duplication testing only), KIT, MEN1, MLH1, MSH2, MSH3, MSH6, MUTYH, NBN, NF1, NHTL1, PALB2, PDGFRA, PMS2, POLD1, POLE, PTEN, RAD50, RAD51C, RAD51D, SDHB, SDHC, SDHD, SMAD4, SMARCA4. STK11, TP53, TSC1, TSC2, and VHL.  The following genes were evaluated for sequence changes only: SDHA and HOXB13 c.251G>A variant only. The report date is 08/27/2018.   09/01/2018 Surgery   status post right lumpectomy 09/01/2018 for ductal carcinoma in situ, 5 mm, with negative margins   09/20/2018 Cancer Staging   Staging form: Breast, AJCC 8th Edition - Pathologic: Stage 0 (pTis (DCIS), pN0, cM0, ER+, PR+) - Signed by Loa Socks, NP on 09/20/2018   10/16/2018 - 11/10/2018 Radiation Therapy    (a) Right Breast was treated to 42.56 Gy in 16 fractions, followed by a boost of 8 Gy in 4 fractions.   05/2019 -  Anti-estrogen oral therapy   Anastrozole daily (delayed start due to having to have knee surgery)     PAST MEDICAL HISTORY: Past Medical History:  Diagnosis Date   Arthritis    Breast cancer, right breast (HCC)    pending right breast lumpectomy on September 01, 2018   Diabetes mellitus type II, controlled (HCC)    On Kombiglyze   Family history of ovarian cancer    Family history of thyroid cancer    GERD (gastroesophageal reflux disease)    rarely occurs- no meds   Hyperlipidemia associated with type 2 diabetes mellitus (HCC)    Hypertension    Hypothyroidism    Personal history of radiation therapy    Primary localized osteoarthritis of right knee 04/17/2019   S/P knee replacement 04/17/2019   Trigeminal neuralgia  of left side of face 2015   s/p Gamma knife  She has a stable lung nodule   PAST SURGICAL HISTORY: Past Surgical History:  Procedure Laterality  Date   BREAST BIOPSY Right 07/24/2018   BREAST LUMPECTOMY Right 09/01/2018   BREAST LUMPECTOMY WITH RADIOACTIVE SEED LOCALIZATION Right 09/01/2018   Procedure: RIGHT BREAST LUMPECTOMY WITH RADIOACTIVE SEED LOCALIZATION;  Surgeon: Ovidio Kin, MD;  Location: [facility];  Service: General;  Laterality: Right;   COLONOSCOPY     Gamma Knife Left 04/08/2016   HYSTEROSCOPY WITH D & C  05/24/2011   Procedure: DILATATION AND CURETTAGE (D&C) /HYSTEROSCOPY;  Surgeon: Geryl Rankins, MD;  Location: [facility];  Service: Gynecology;  Laterality: N/A;   KNEE SURGERY Right 06/10/15   torn ligament   PARTIAL KNEE ARTHROPLASTY Right 04/17/2019   Procedure: UNICOMPARTMENTAL KNEE;  Surgeon: Teryl Lucy, MD;  Location: [facility];  Service: Orthopedics;  Laterality: Right;   THYROIDECTOMY N/A 09/08/2016   Procedure: TOTAL THYROIDECTOMY;  Surgeon: Newman Pies, MD;  Location: [facility];  Service: ENT;  Laterality: N/A;   TUBAL LIGATION     uterine polyp     WISDOM TOOTH EXTRACTION Left 2014    FAMILY HISTORY Family History  Problem Relation Age of Onset   Hypertension Mother    Kidney failure Mother    Congestive Heart Failure Mother    Heart Problems Father    Stroke Father    Pneumonia Father    Heart Problems Sister 28       heart transplant   Stroke Paternal Grandmother    Dementia Paternal Grandmother    Stroke Paternal Grandfather    Cardiomyopathy Sister    Ovarian cancer Sister 60       d. 79   Thyroid disease Sister    Thyroid disease Sister    Thyroid cancer Niece 75   Breast cancer Neg Hx   (as of 08/02/2018) Patient father was 34 years old when he died from pneumonia and a stroke. Patient mother died from possible heart attack at age 53.  The patient denies a family hx of breast cancer. A sister was diagnosed with ovarian cancer, and has since passed away at age 40.  The patient has 5 siblings, 5 sisters and 0 brothers.   GYNECOLOGIC HISTORY:  No LMP recorded. Patient is  postmenopausal. Menarche: 72 years old Age at first live birth: 72 years old GX P 4 LMP 1996 Contraceptive yes HRT no  Hysterectomy? Yes, 5-6 years ago (per patient) BSO? 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 Husband Roe Coombs is a retired Charity fundraiser. Bradley Ferris, age 62, is a NP for an opioid clinic in [place]. Son Mellody Dance, age 44, is a Corporate investment banker in [place]. Daughter Shon Baton, age 44, works as a Scientist, clinical (histocompatibility and immunogenetics) for [employer]. Son Benjamine Mola, age 61, works in Office manager in [place].  The patient has no grandchildren however she has a foster child who is currently 62.  The patient belongs to [facility]    ADVANCED DIRECTIVES: Husband Roe Coombs is her HCPOA.   HEALTH MAINTENANCE: Social History   Tobacco Use   Smoking status: Never   Smokeless tobacco: Never  Vaping Use   Vaping Use: Never used  Substance Use Topics   Alcohol use: No   Drug use: No     Colonoscopy: 2019, Dr. Loreta Ave  PAP: 03/21/2011, normal  Bone density: Pending   Allergies  Allergen Reactions   Cyclobenzaprine  Nausea Only    Current Outpatient Medications  Medication Sig Dispense Refill   Accu-Chek FastClix Lancets MISC Use as instructed to check blood sugars 1 time per day dx: e11.22 (Patient not taking: Reported on 10/19/2022) 150 each 2   anastrozole (ARIMIDEX) 1 MG tablet Take 1 tablet (1 mg total) by mouth daily. Start Apr 29, 2019 90 tablet 4   baclofen (LIORESAL) 10 MG tablet TAKE 1 TABLET BY MOUTH THREE TIMES A DAY AS NEEDED FOR MUSCLE SPASM 270 tablet 0   cetirizine (ZYRTEC) 10 MG tablet TAKE 1 TABLET BY MOUTH EVERY DAY 30 tablet 0   Cholecalciferol (VITAMIN D) 125 MCG (5000 UT) CAPS Take 5,000 Units by mouth daily.     COMBIGAN 0.2-0.5 % ophthalmic solution Place 1 drop into both eyes 2 (two) times daily.     dapagliflozin propanediol (FARXIGA) 10 MG TABS tablet Take 1 tablet (10 mg total) by mouth daily before breakfast. 90 tablet 1   DULoxetine  (CYMBALTA) 60 MG capsule TAKE 1 CAPSULE BY MOUTH EVERY DAY (Patient taking differently: TAKE 2 CAPSULE BY MOUTH EVERY DAY. Patient reports once in the morning & once in the evening.) 90 capsule 1   famotidine (PEPCID) 20 MG tablet TAKE 1 TABLET BY MOUTH TWICE A DAY 60 tablet 1   gabapentin (NEURONTIN) 300 MG capsule TAKE 3 CAPSULES BY MOUTH 3 TIMES DAILY. 270 capsule 3   glucose blood (CONTOUR NEXT TEST) test strip Use daily to check blood sugars. 100 each 2   HYDROcodone-acetaminophen (NORCO/VICODIN) 5-325 MG tablet Take 1 tablet by mouth every 6 (six) hours as needed for severe pain. 12 tablet 0   latanoprost (XALATAN) 0.005 % ophthalmic solution Place 1 drop into both eyes at bedtime.     levothyroxine (SYNTHROID) 50 MCG tablet TAKE 1 TABLET BY MOUTH MONDAY - FRIDAY ONLY 90 tablet 1   levothyroxine (SYNTHROID) 75 MCG tablet Take 1 tablet (75 mcg total) by mouth once a week. SUNDAY ONLY 12 tablet 1   losartan-hydrochlorothiazide (HYZAAR) 50-12.5 MG tablet TAKE 1 TABLET BY MOUTH EVERY DAY 90 tablet 1   metFORMIN (GLUCOPHAGE-XR) 750 MG 24 hr tablet TAKE 1 TABLET BY MOUTH EVERY DAY 90 tablet 1   pravastatin (PRAVACHOL) 80 MG tablet One tab po qd, skip Sundays 75 tablet 2   Saxagliptin-Metformin 5-500 MG TB24 TAKE 1 TABLET BY MOUTH EVERY DAY BEFORE SUPPER (Patient not taking: Reported on 10/19/2022) 90 tablet 1   No current facility-administered medications for this visit.    OBJECTIVE: African-American woman in no acute distress  Vitals:   11/23/22 1320  BP: 130/67  Pulse: 100  Resp: 16  Temp: 98.2 F (36.8 C)  SpO2: 99%     Wt Readings from Last 3 Encounters:  11/23/22 160 lb 14.4 oz (73 kg)  11/09/22 165 lb 5.5 oz (75 kg)  10/19/22 166 lb (75.3 kg)   Body mass index is 26.78 kg/m.     Physical Exam Constitutional:      Appearance: Normal appearance.  Cardiovascular:     Rate and Rhythm: Normal rate and regular rhythm.     Pulses: Normal pulses.     Heart sounds: Normal  heart sounds.  Chest:     Comments: Bilateral breast examined.  No palpable masses except for surgical changes.  No regional adenopathy Musculoskeletal:     Cervical back: Normal range of motion and neck supple. No rigidity.  Lymphadenopathy:     Cervical: No cervical adenopathy.  Neurological:  Mental Status: She is alert.     LAB RESULTS:  CMP     Component Value Date/Time   NA 144 09/27/2022 1611   K 3.7 09/27/2022 1611   CL 103 09/27/2022 1611   CO2 24 09/27/2022 1611   GLUCOSE 142 (H) 09/27/2022 1611   GLUCOSE 138 (H) 04/18/2019 0300   BUN 16 09/27/2022 1611   CREATININE 0.87 09/27/2022 1611   CREATININE 0.77 08/02/2018 0835   CALCIUM 9.4 09/27/2022 1611   PROT 7.0 09/27/2022 1611   ALBUMIN 4.3 09/27/2022 1611   AST 20 09/27/2022 1611   AST 18 08/02/2018 0835   ALT 21 09/27/2022 1611   ALT 22 08/02/2018 0835   ALKPHOS 101 09/27/2022 1611   BILITOT 0.5 09/27/2022 1611   BILITOT 0.7 08/02/2018 0835   GFRNONAA 92 06/19/2020 1556   GFRNONAA >60 08/02/2018 0835   GFRAA 105 06/19/2020 1556   GFRAA >60 08/02/2018 0835    No results found for: "TOTALPROTELP", "ALBUMINELP", "A1GS", "A2GS", "BETS", "BETA2SER", "GAMS", "MSPIKE", "SPEI"  No results found for: "KPAFRELGTCHN", "LAMBDASER", "KAPLAMBRATIO"  Lab Results  Component Value Date   WBC 5.7 09/27/2022   NEUTROABS 3.1 08/31/2021   HGB 13.1 09/27/2022   HCT 39.5 09/27/2022   MCV 81 09/27/2022   PLT 273 09/27/2022   No results found for: "LABCA2"  No components found for: "ZOXWRU045"  No results for input(s): "INR" in the last 168 hours.  No results found for: "LABCA2"  No results found for: "WUJ811"  No results found for: "CAN125"  No results found for: "CAN153"  No results found for: "CA2729"  No components found for: "HGQUANT"  No results found for: "CEA1", "CEA" / No results found for: "CEA1", "CEA"   No results found for: "AFPTUMOR"  No results found for: "CHROMOGRNA"  No results  found for: "HGBA", "HGBA2QUANT", "HGBFQUANT", "HGBSQUAN" (Hemoglobinopathy evaluation)   No results found for: "LDH"  No results found for: "IRON", "TIBC", "IRONPCTSAT" (Iron and TIBC)  No results found for: "FERRITIN"  Urinalysis    Component Value Date/Time   COLORURINE YELLOW 08/17/2007 1404   APPEARANCEUR CLEAR 08/17/2007 1404   LABSPEC 1.010 08/17/2007 1404   PHURINE 7.0 08/17/2007 1404   GLUCOSEU NEGATIVE 08/17/2007 1404   HGBUR NEGATIVE 08/17/2007 1404   BILIRUBINUR negative 02/22/2022 1519   KETONESUR negative 04/12/2016 1147   KETONESUR NEGATIVE 08/17/2007 1404   PROTEINUR Negative 02/22/2022 1519   PROTEINUR NEGATIVE 08/17/2007 1404   UROBILINOGEN 0.2 02/22/2022 1519   UROBILINOGEN 0.2 08/17/2007 1404   NITRITE negative 02/22/2022 1519   NITRITE NEGATIVE 08/17/2007 1404   LEUKOCYTESUR Negative 02/22/2022 1519    STUDIES: MM DIAG BREAST TOMO BILATERAL  Addendum Date: 10/30/2022   ADDENDUM REPORT: 10/30/2022 11:48 ADDENDUM: The clinical data section should read as follows: Status post right lumpectomy on 09/01/2018 with radiation therapy and anastrozole. Electronically Signed   By: Beckie Salts M.D.   On: 10/30/2022 11:48   Result Date: 10/30/2022 CLINICAL DATA:  Status post right lumpectomy on 09/01/2018 with radiation therapy and anastrozole. [facility] EXAM: DIGITAL DIAGNOSTIC BILATERAL MAMMOGRAM WITH TOMOSYNTHESIS TECHNIQUE: Bilateral digital diagnostic mammography and breast tomosynthesis was performed. COMPARISON:  Previous exam(s). ACR Breast Density Category c: The breasts are heterogeneously dense, which may obscure small masses. FINDINGS: Stable post lumpectomy changes on the right. No interval findings suspicious for malignancy in either breast. IMPRESSION: No evidence of malignancy. RECOMMENDATION: Per protocol, as the patient is now 2 or more years status post lumpectomy, she may return to annual  screening mammography in 1 year. However, given the history of  breast cancer, the patient remains eligible for annual diagnostic mammography if preferred. I have discussed the findings and recommendations with the patient. If applicable, a reminder letter will be sent to the patient regarding the next appointment. BI-RADS CATEGORY  2: Benign. Electronically Signed: By: Beckie Salts M.D. On: 10/28/2022 16:28     ELIGIBLE FOR AVAILABLE RESEARCH PROTOCOL: no  ASSESSMENT: 72 y.o. [place] woman status post right breast biopsy 07/24/2018 for ductal carcinoma in situ, grade 1, strongly estrogen and progesterone receptor positive.  (1) status post right lumpectomy 09/01/2018 for ductal carcinoma in situ, 5 mm, with negative margins  (2) adjuvant radiation 10/16/2018 - 11/10/2018  (a) Right Breast was treated to 42.56 Gy in 16 fractions, followed by a boost of 8 Gy in 4 fractions.  (3) started anastrozole 04/29/2019  (a) bone density 10/18/2020 showed a T score of 0.3 (normal).  (4) genetics testing 08/27/2018 on the STAT panel and the common hereditary cancer gene paneloffered by [facility] found no deleterious mutations in APC, ATM, AXIN2, BARD1, BMPR1A, BRCA1, BRCA2, BRIP1, CDH1, CDK4, CDKN2A (p14ARF), CDKN2A (p16INK4a), CHEK2, CTNNA1, DICER1, EPCAM (Deletion/duplication testing only), GREM1 (promoter region deletion/duplication testing only), KIT, MEN1, MLH1, MSH2, MSH3, MSH6, MUTYH, NBN, NF1, NHTL1, PALB2, PDGFRA, PMS2, POLD1, POLE, PTEN, RAD50, RAD51C, RAD51D, SDHB, SDHC, SDHD, SMAD4, SMARCA4. STK11, TP53, TSC1, TSC2, and VHL.  The following genes were evaluated for sequence changes only: SDHA and HOXB13 c.251G>A variant only.    PLAN: Alfonse Ras is now a little over 4 years out from definitive surgery for her breast cancer with no evidence of disease recurrence.  This is very favorable. She is tolerating anastrozole well and the plan is to continue that a total of 5 years. No changes on breast exam, mammogram due in May 2025. Bone density due now, ordered again.  Encouraged regular walking. RTC in 1 yr.   Total time spent: 20 minutes *Total Encounter Time as defined by the Centers for Medicare and Medicaid Services includes, in addition to the face-to-face time of a patient visit (documented in the note above) non-face-to-face time: obtaining and reviewing outside history, ordering and reviewing medications, tests or procedures, care coordination (communications with other health care professionals or caregivers) and documentation in the medical record.

## 2022-11-29 ENCOUNTER — Other Ambulatory Visit: Payer: Medicare PPO

## 2022-12-06 ENCOUNTER — Other Ambulatory Visit: Payer: Medicare PPO

## 2022-12-06 DIAGNOSIS — E039 Hypothyroidism, unspecified: Secondary | ICD-10-CM

## 2022-12-07 LAB — TSH+FREE T4
Free T4: 1.21 ng/dL (ref 0.82–1.77)
TSH: 0.521 u[IU]/mL (ref 0.450–4.500)

## 2022-12-08 ENCOUNTER — Other Ambulatory Visit: Payer: Self-pay | Admitting: Internal Medicine

## 2022-12-18 ENCOUNTER — Other Ambulatory Visit: Payer: Self-pay | Admitting: Internal Medicine

## 2022-12-20 ENCOUNTER — Other Ambulatory Visit: Payer: Self-pay | Admitting: Internal Medicine

## 2022-12-25 DIAGNOSIS — M1990 Unspecified osteoarthritis, unspecified site: Secondary | ICD-10-CM | POA: Diagnosis not present

## 2022-12-25 DIAGNOSIS — N189 Chronic kidney disease, unspecified: Secondary | ICD-10-CM | POA: Diagnosis not present

## 2022-12-25 DIAGNOSIS — H409 Unspecified glaucoma: Secondary | ICD-10-CM | POA: Diagnosis not present

## 2022-12-25 DIAGNOSIS — J302 Other seasonal allergic rhinitis: Secondary | ICD-10-CM | POA: Diagnosis not present

## 2022-12-25 DIAGNOSIS — I129 Hypertensive chronic kidney disease with stage 1 through stage 4 chronic kidney disease, or unspecified chronic kidney disease: Secondary | ICD-10-CM | POA: Diagnosis not present

## 2022-12-25 DIAGNOSIS — E785 Hyperlipidemia, unspecified: Secondary | ICD-10-CM | POA: Diagnosis not present

## 2022-12-25 DIAGNOSIS — E1122 Type 2 diabetes mellitus with diabetic chronic kidney disease: Secondary | ICD-10-CM | POA: Diagnosis not present

## 2022-12-25 DIAGNOSIS — G500 Trigeminal neuralgia: Secondary | ICD-10-CM | POA: Diagnosis not present

## 2022-12-25 DIAGNOSIS — E890 Postprocedural hypothyroidism: Secondary | ICD-10-CM | POA: Diagnosis not present

## 2022-12-27 DIAGNOSIS — M7532 Calcific tendinitis of left shoulder: Secondary | ICD-10-CM | POA: Diagnosis not present

## 2022-12-27 DIAGNOSIS — M25511 Pain in right shoulder: Secondary | ICD-10-CM | POA: Diagnosis not present

## 2023-01-23 ENCOUNTER — Other Ambulatory Visit: Payer: Self-pay | Admitting: Internal Medicine

## 2023-01-23 DIAGNOSIS — E1165 Type 2 diabetes mellitus with hyperglycemia: Secondary | ICD-10-CM

## 2023-01-27 ENCOUNTER — Ambulatory Visit: Payer: Medicare PPO | Admitting: Internal Medicine

## 2023-01-27 NOTE — Progress Notes (Deleted)
I, T Deloria Lair, CMA,acting as a Neurosurgeon for Christina Aliment, MD.,have documented all relevant documentation on the behalf of Christina Aliment, MD,as directed by  Christina Aliment, MD while in the presence of Christina Aliment, MD.  Subjective:  Patient ID: Christina Hernandez , female    DOB: [birth year] , 72 y.o.   MRN: [id number]  No chief complaint on file.   HPI  Patient presents today for a DM and BP check. Patient reports compliance with medications. She denies having any headaches, chest pain and shortness of breath. She has no new concerns at this time.        Diabetes She presents for her follow-up diabetic visit. She has type 2 diabetes mellitus. There are no hypoglycemic associated symptoms. There are no diabetic associated symptoms. Pertinent negatives for diabetes include no blurred vision and no chest pain. There are no hypoglycemic complications. Risk factors for coronary artery disease include diabetes mellitus, dyslipidemia, hypertension, sedentary lifestyle and post-menopausal. She participates in exercise intermittently. An ACE inhibitor/angiotensin II receptor blocker is being taken. Eye exam is current.  Hypertension This is a chronic problem. The current episode started more than 1 year ago. The problem has been gradually improving since onset. The problem is controlled. Pertinent negatives include no blurred vision, chest pain, palpitations or shortness of breath. Risk factors for coronary artery disease include diabetes mellitus, dyslipidemia, post-menopausal state and sedentary lifestyle. The current treatment provides moderate improvement. Compliance problems include exercise.      Past Medical History:  Diagnosis Date   Arthritis    Breast cancer, right breast (HCC)    pending right breast lumpectomy on September 01, 2018   Diabetes mellitus type II, controlled (HCC)    On Kombiglyze   Family history of ovarian cancer    Family history of thyroid cancer     GERD (gastroesophageal reflux disease)    rarely occurs- no meds   Hyperlipidemia associated with type 2 diabetes mellitus (HCC)    Hypertension    Hypothyroidism    Personal history of radiation therapy    Primary localized osteoarthritis of right knee 04/17/2019   S/P knee replacement 04/17/2019   Trigeminal neuralgia of left side of face 2015   s/p Gamma knife     Family History  Problem Relation Age of Onset   Hypertension Mother    Kidney failure Mother    Congestive Heart Failure Mother    Heart Problems Father    Stroke Father    Pneumonia Father    Heart Problems Sister 57       heart transplant   Stroke Paternal Grandmother    Dementia Paternal Grandmother    Stroke Paternal Grandfather    Cardiomyopathy Sister    Ovarian cancer Sister 60       d. 73   Thyroid disease Sister    Thyroid disease Sister    Thyroid cancer Niece 51   Breast cancer Neg Hx      Current Outpatient Medications:    anastrozole (ARIMIDEX) 1 MG tablet, Take 1 tablet (1 mg total) by mouth daily. Start Apr 29, 2019, Disp: 90 tablet, Rfl: 4   baclofen (LIORESAL) 10 MG tablet, TAKE 1 TABLET BY MOUTH THREE TIMES A DAY AS NEEDED FOR MUSCLE SPASM, Disp: 270 tablet, Rfl: 0   cetirizine (ZYRTEC) 10 MG tablet, TAKE 1 TABLET BY MOUTH EVERY DAY, Disp: 30 tablet, Rfl: 0   Cholecalciferol (VITAMIN D) 125 MCG (5000 UT) CAPS, Take 5,000 Units  by mouth daily., Disp: , Rfl:    COMBIGAN 0.2-0.5 % ophthalmic solution, Place 1 drop into both eyes 2 (two) times daily., Disp: , Rfl:    DULoxetine (CYMBALTA) 60 MG capsule, TAKE 1 CAPSULE BY MOUTH EVERY DAY (Patient taking differently: TAKE 2 CAPSULE BY MOUTH EVERY DAY. Patient reports once in the morning & once in the evening.), Disp: 90 capsule, Rfl: 1   FARXIGA 10 MG TABS tablet, TAKE 1 TABLET BY MOUTH DAILY BEFORE BREAKFAST., Disp: 90 tablet, Rfl: 1   gabapentin (NEURONTIN) 300 MG capsule, TAKE 3 CAPSULES BY MOUTH 3 TIMES DAILY., Disp: 270 capsule, Rfl: 3    glucose blood (CONTOUR NEXT TEST) test strip, Use daily to check blood sugars., Disp: 100 each, Rfl: 2   HYDROcodone-acetaminophen (NORCO/VICODIN) 5-325 MG tablet, Take 1 tablet by mouth every 6 (six) hours as needed for severe pain., Disp: 12 tablet, Rfl: 0   latanoprost (XALATAN) 0.005 % ophthalmic solution, Place 1 drop into both eyes at bedtime., Disp: , Rfl:    levothyroxine (SYNTHROID) 50 MCG tablet, TAKE 1 TABLET BY MOUTH MONDAY THROUGH FRIDAY ONLY, Disp: 60 tablet, Rfl: 2   levothyroxine (SYNTHROID) 75 MCG tablet, Take 1 tablet (75 mcg total) by mouth once a week. SUNDAY ONLY, Disp: 12 tablet, Rfl: 1   losartan-hydrochlorothiazide (HYZAAR) 50-12.5 MG tablet, TAKE 1 TABLET BY MOUTH EVERY DAY, Disp: 90 tablet, Rfl: 1   metFORMIN (GLUCOPHAGE-XR) 750 MG 24 hr tablet, TAKE 1 TABLET BY MOUTH EVERY DAY, Disp: 90 tablet, Rfl: 1   pravastatin (PRAVACHOL) 80 MG tablet, One tab po qd, skip Sundays, Disp: 75 tablet, Rfl: 2   Allergies  Allergen Reactions   Cyclobenzaprine Nausea Only     Review of Systems  Constitutional: Negative.   Eyes:  Negative for blurred vision.  Respiratory: Negative.  Negative for shortness of breath.   Cardiovascular: Negative.  Negative for chest pain and palpitations.  Neurological: Negative.   Psychiatric/Behavioral: Negative.       There were no vitals filed for this visit. There is no height or weight on file to calculate BMI.  Wt Readings from Last 3 Encounters:  11/23/22 160 lb 14.4 oz (73 kg)  11/09/22 165 lb 5.5 oz (75 kg)  10/19/22 166 lb (75.3 kg)     Objective:  Physical Exam      Assessment And Plan:  Hypertensive heart disease without heart failure  Type 2 diabetes mellitus with hyperlipidemia (HCC)  Primary hypothyroidism  Atherosclerosis of aorta (HCC)     No follow-ups on file.  Patient was given opportunity to ask questions. Patient verbalized understanding of the plan and was able to repeat key elements of the plan. All  questions were answered to their satisfaction.  Christina Aliment, MD  I, Christina Aliment, MD, have reviewed all documentation for this visit. The documentation on 01/27/23 for the exam, diagnosis, procedures, and orders are all accurate and complete.   IF YOU HAVE BEEN REFERRED TO A SPECIALIST, IT MAY TAKE 1-2 WEEKS TO SCHEDULE/PROCESS THE REFERRAL. IF YOU HAVE NOT HEARD FROM US/SPECIALIST IN TWO WEEKS, PLEASE GIVE [place] A CALL AT [phone] X [phone].   THE PATIENT IS ENCOURAGED TO PRACTICE SOCIAL DISTANCING DUE TO THE COVID-19 PANDEMIC.

## 2023-02-03 ENCOUNTER — Ambulatory Visit: Payer: Medicare PPO | Admitting: Internal Medicine

## 2023-02-09 ENCOUNTER — Ambulatory Visit: Payer: Medicare PPO | Admitting: Internal Medicine

## 2023-02-09 ENCOUNTER — Encounter: Payer: Self-pay | Admitting: Internal Medicine

## 2023-02-09 VITALS — BP 110/70 | HR 98 | Temp 97.6°F | Ht 65.0 in | Wt 162.8 lb

## 2023-02-09 DIAGNOSIS — C50011 Malignant neoplasm of nipple and areola, right female breast: Secondary | ICD-10-CM

## 2023-02-09 DIAGNOSIS — K760 Fatty (change of) liver, not elsewhere classified: Secondary | ICD-10-CM | POA: Diagnosis not present

## 2023-02-09 DIAGNOSIS — E785 Hyperlipidemia, unspecified: Secondary | ICD-10-CM | POA: Diagnosis not present

## 2023-02-09 DIAGNOSIS — I119 Hypertensive heart disease without heart failure: Secondary | ICD-10-CM

## 2023-02-09 DIAGNOSIS — H6123 Impacted cerumen, bilateral: Secondary | ICD-10-CM

## 2023-02-09 DIAGNOSIS — I700 Atherosclerosis of aorta: Secondary | ICD-10-CM

## 2023-02-09 DIAGNOSIS — E1169 Type 2 diabetes mellitus with other specified complication: Secondary | ICD-10-CM | POA: Diagnosis not present

## 2023-02-09 MED ORDER — LOSARTAN POTASSIUM-HCTZ 50-12.5 MG PO TABS: 1.0000 | ORAL_TABLET | Freq: Every day | ORAL | 1 refills | Status: DC

## 2023-02-09 MED ORDER — LEVOTHYROXINE SODIUM 50 MCG PO TABS: ORAL_TABLET | ORAL | 2 refills | Status: DC

## 2023-02-09 NOTE — Patient Instructions (Signed)

## 2023-02-09 NOTE — Progress Notes (Unsigned)
I,Christina Hernandez, CMA,acting as a Neurosurgeon for [name], MD.,have documented all relevant documentation on the behalf of [name], MD,as directed by  [name], MD while in the presence of [name], MD.  Subjective:  Patient ID: Christina Hernandez , female    DOB: 08-16-1950 , 72 y.o.   MRN: [id number]  Chief Complaint  Patient presents with  . Diabetes  . Hypertension  . Hypothyroidism    HPI  Patient presents today for a DM and BP check. Patient reports compliance with medications. She denies having any headaches, chest pain and shortness of breath. She has not had any hospitalizations since her last visit.   She reports wanting her ears checked, she feels she cannot hear well.        Diabetes She presents for her follow-up diabetic visit. She has type 2 diabetes mellitus. There are no hypoglycemic associated symptoms. There are no diabetic associated symptoms. Pertinent negatives for diabetes include no blurred vision and no chest pain. There are no hypoglycemic complications. Risk factors for coronary artery disease include diabetes mellitus, dyslipidemia, hypertension, sedentary lifestyle and post-menopausal. She participates in exercise intermittently. An ACE inhibitor/angiotensin II receptor blocker is being taken. Eye exam is current.  Hypertension This is a chronic problem. The current episode started more than 1 year ago. The problem has been gradually improving since onset. The problem is controlled. Pertinent negatives include no blurred vision, chest pain, palpitations or shortness of breath. Risk factors for coronary artery disease include diabetes mellitus, dyslipidemia, post-menopausal state and sedentary lifestyle. The current treatment provides moderate improvement. Compliance problems include exercise.      Past Medical History:  Diagnosis Date  . Arthritis   . Breast cancer, right breast [facility])    pending right breast lumpectomy on  September 01, 2018  . Diabetes mellitus type II, controlled (HCC)    On Kombiglyze  . Family history of ovarian cancer   . Family history of thyroid cancer   . GERD (gastroesophageal reflux disease)    rarely occurs- no meds  . Hyperlipidemia associated with type 2 diabetes mellitus (HCC)   . Hypertension   . Hypothyroidism   . Personal history of radiation therapy   . Primary localized osteoarthritis of right knee 04/17/2019  . S/P knee replacement 04/17/2019  . Trigeminal neuralgia of left side of face 2015   s/p Gamma knife     Family History  Problem Relation Age of Onset  . Hypertension Mother   . Kidney failure Mother   . Congestive Heart Failure Mother   . Heart Problems Father   . Stroke Father   . Pneumonia Father   . Heart Problems Sister 40       heart transplant  . Stroke Paternal Grandmother   . Dementia Paternal Grandmother   . Stroke Paternal Grandfather   . Cardiomyopathy Sister   . Ovarian cancer Sister 13       d. 64  . Thyroid disease Sister   . Thyroid disease Sister   . Thyroid cancer Niece 74  . Breast cancer Neg Hx      Current Outpatient Medications:  .  anastrozole (ARIMIDEX) 1 MG tablet, Take 1 tablet (1 mg total) by mouth daily. Start Apr 29, 2019, Disp: 90 tablet, Rfl: 4 .  baclofen (LIORESAL) 10 MG tablet, TAKE 1 TABLET BY MOUTH THREE TIMES A DAY AS NEEDED FOR MUSCLE SPASM, Disp: 270 tablet, Rfl: 0 .  cetirizine (ZYRTEC) 10  MG tablet, TAKE 1 TABLET BY MOUTH EVERY DAY, Disp: 30 tablet, Rfl: 0 .  Cholecalciferol (VITAMIN D) 125 MCG (5000 UT) CAPS, Take 5,000 Units by mouth daily., Disp: , Rfl:  .  COMBIGAN 0.2-0.5 % ophthalmic solution, Place 1 drop into both eyes 2 (two) times daily., Disp: , Rfl:  .  DULoxetine (CYMBALTA) 60 MG capsule, TAKE 1 CAPSULE BY MOUTH EVERY DAY (Patient taking differently: TAKE 2 CAPSULE BY MOUTH EVERY DAY. Patient reports once in the morning & once in the evening.), Disp: 90 capsule, Rfl: 1 .  FARXIGA 10 MG TABS tablet,  TAKE 1 TABLET BY MOUTH DAILY BEFORE BREAKFAST., Disp: 90 tablet, Rfl: 1 .  gabapentin (NEURONTIN) 300 MG capsule, TAKE 3 CAPSULES BY MOUTH 3 TIMES DAILY., Disp: 270 capsule, Rfl: 3 .  glucose blood (CONTOUR NEXT TEST) test strip, Use daily to check blood sugars., Disp: 100 each, Rfl: 2 .  HYDROcodone-acetaminophen (NORCO/VICODIN) 5-325 MG tablet, Take 1 tablet by mouth every 6 (six) hours as needed for severe pain., Disp: 12 tablet, Rfl: 0 .  latanoprost (XALATAN) 0.005 % ophthalmic solution, Place 1 drop into both eyes at bedtime., Disp: , Rfl:  .  levothyroxine (SYNTHROID) 50 MCG tablet, One tab po daily M-Saturday, Disp: 75 tablet, Rfl: 2 .  levothyroxine (SYNTHROID) 75 MCG tablet, Take 1 tablet (75 mcg total) by mouth once a week. SUNDAY ONLY, Disp: 12 tablet, Rfl: 1 .  metFORMIN (GLUCOPHAGE-XR) 750 MG 24 hr tablet, TAKE 1 TABLET BY MOUTH EVERY DAY, Disp: 90 tablet, Rfl: 1 .  pravastatin (PRAVACHOL) 80 MG tablet, One tab po qd, skip Sundays, Disp: 75 tablet, Rfl: 2 .  losartan-hydrochlorothiazide (HYZAAR) 50-12.5 MG tablet, Take 1 tablet by mouth daily., Disp: 90 tablet, Rfl: 1   Allergies  Allergen Reactions  . Cyclobenzaprine Nausea Only     Review of Systems  Constitutional: Negative.   HENT: Negative.    Eyes:  Negative for blurred vision.  Respiratory: Negative.  Negative for shortness of breath.   Cardiovascular: Negative.  Negative for chest pain and palpitations.  Gastrointestinal: Negative.   Neurological: Negative.   Psychiatric/Behavioral: Negative.       Today's Vitals   02/09/23 1024  BP: 110/70  Pulse: 98  Temp: 97.6 F (36.4 C)  SpO2: 98%  Weight: 162 lb 12.8 oz (73.8 kg)  Height: 5\' 5"  (1.651 m)   Body mass index is 27.09 kg/m.  Wt Readings from Last 3 Encounters:  02/09/23 162 lb 12.8 oz (73.8 kg)  11/23/22 160 lb 14.4 oz (73 kg)  11/09/22 165 lb 5.5 oz (75 kg)     Objective:  Physical Exam Vitals and nursing note reviewed.  Constitutional:       Appearance: Normal appearance.  HENT:     Head: Normocephalic and atraumatic.  Eyes:     Extraocular Movements: Extraocular movements intact.  Cardiovascular:     Rate and Rhythm: Normal rate and regular rhythm.     Heart sounds: Normal heart sounds.  Pulmonary:     Effort: Pulmonary effort is normal.     Breath sounds: Normal breath sounds.  Musculoskeletal:     Cervical back: Normal range of motion.  Skin:    General: Skin is warm.  Neurological:     General: No focal deficit present.     Mental Status: She is alert.  Psychiatric:        Mood and Affect: Mood normal.        Behavior: Behavior normal.  Assessment And Plan:  Type 2 diabetes mellitus with hyperlipidemia (HCC) -     CMP14+EGFR -     Lipid panel -     Hemoglobin A1c -     Microalbumin / creatinine urine ratio  Hypertensive heart disease without heart failure -     CMP14+EGFR -     Lipid panel  Atherosclerosis of aorta (HCC)  Malignant neoplasm of nipple of right breast in female, unspecified estrogen receptor status (HCC)  Other orders -     Losartan Potassium-HCTZ; Take 1 tablet by mouth daily.  Dispense: 90 tablet; Refill: 1 -     Levothyroxine Sodium; One tab po daily M-Saturday  Dispense: 75 tablet; Refill: 2     Return for 4 month dm.  Patient was given opportunity to ask questions. Patient verbalized understanding of the plan and was able to repeat key elements of the plan. All questions were answered to their satisfaction.   I, [name], MD, have reviewed all documentation for this visit. The documentation on 02/09/23 for the exam, diagnosis, procedures, and orders are all accurate and complete.   IF YOU HAVE BEEN REFERRED TO A SPECIALIST, IT MAY TAKE 1-2 WEEKS TO SCHEDULE/PROCESS THE REFERRAL. IF YOU HAVE NOT HEARD FROM US/SPECIALIST IN TWO WEEKS, PLEASE GIVE [place] A CALL AT [phone].   THE PATIENT IS ENCOURAGED TO PRACTICE SOCIAL DISTANCING DUE TO THE COVID-19 PANDEMIC.

## 2023-02-10 DIAGNOSIS — K760 Fatty (change of) liver, not elsewhere classified: Secondary | ICD-10-CM | POA: Insufficient documentation

## 2023-02-10 LAB — LIPID PANEL
Chol/HDL Ratio: 2.7 ratio (ref 0.0–4.4)
Cholesterol, Total: 160 mg/dL (ref 100–199)
HDL: 60 mg/dL (ref >39)
LDL Chol Calc (NIH): 81 mg/dL (ref 0–99)
Triglycerides: 105 mg/dL (ref 0–149)
VLDL Cholesterol Cal: 19 mg/dL (ref 5–40)

## 2023-02-10 LAB — CMP14+EGFR
ALT: 10 IU/L (ref 0–32)
AST: 15 IU/L (ref 0–40)
Albumin: 4.3 g/dL (ref 3.8–4.8)
Alkaline Phosphatase: 104 IU/L (ref 44–121)
BUN/Creatinine Ratio: 27 (ref 12–28)
BUN: 19 mg/dL (ref 8–27)
Bilirubin Total: 0.5 mg/dL (ref 0.0–1.2)
CO2: 24 mmol/L (ref 20–29)
Calcium: 9.8 mg/dL (ref 8.7–10.3)
Chloride: 102 mmol/L (ref 96–106)
Creatinine, Ser: 0.7 mg/dL (ref 0.57–1.00)
Globulin, Total: 2.8 g/dL (ref 1.5–4.5)
Glucose: 88 mg/dL (ref 70–99)
Potassium: 4 mmol/L (ref 3.5–5.2)
Sodium: 141 mmol/L (ref 134–144)
Total Protein: 7.1 g/dL (ref 6.0–8.5)
eGFR: 91 mL/min/{1.73_m2} (ref >59)

## 2023-02-10 LAB — HEMOGLOBIN A1C
Est. average glucose Bld gHb Est-mCnc: 134 mg/dL
Hgb A1c MFr Bld: 6.3 % — ABNORMAL HIGH (ref 4.8–5.6)

## 2023-02-10 NOTE — Assessment & Plan Note (Signed)
AFTER OBTAINING VERBAL CONSENT, BOTH EARS WERE FLUSHED BY IRRIGATION. SHE TOLERATED PROCEDURE WELL WITHOUT ANY COMPLICATIONS. NO TM ABNORMALITIES WERE NOTED.

## 2023-02-10 NOTE — Assessment & Plan Note (Signed)
Chronic, LDL goal is less than 70.  She will c/w pravastatin 80mg  daily.  She will continue with metformin XR 750mg  and Farxiga 10mg  daily for her diabetes. She is encouraged to avoid refined carbs. She will f/u in 3-4 months for re-evaluation.

## 2023-02-10 NOTE — Assessment & Plan Note (Signed)
Chronic, LDL goal< 70.  She will c/w pravastatin and encouraged to follow heart healthy lifestyle.

## 2023-02-10 NOTE — Assessment & Plan Note (Signed)
She is encouraged to avoid sugary beverages/foods, avoid alcohol, remain compliant with chol/DM meds

## 2023-02-10 NOTE — Assessment & Plan Note (Signed)
Chronic, well controlled.  She will continue with losartan/hct 50/12.5mg  daily. She is reminded to follow low sodium diet.

## 2023-02-10 NOTE — Assessment & Plan Note (Addendum)
She is s/p right lumpectomy and adjuvant radiation.  She is currently followed by Oncology, most recent note reviewed. She is currently taking anastrozole 1mg  daily.

## 2023-02-24 ENCOUNTER — Telehealth: Payer: Self-pay

## 2023-02-24 NOTE — Telephone Encounter (Signed)
Call to pt reference PREP referral.  Explained next class at [place] [facility] is Oct 21 MW 230p-345p. Maybe interested.  She may be selling her home and moving away.  Requested she call me when she is ready to participate.  Asked that I text her my contact info-done.

## 2023-03-17 ENCOUNTER — Other Ambulatory Visit: Payer: Self-pay | Admitting: Internal Medicine

## 2023-03-29 ENCOUNTER — Other Ambulatory Visit: Payer: Self-pay | Admitting: Hematology and Oncology

## 2023-03-30 ENCOUNTER — Encounter: Payer: Self-pay | Admitting: Internal Medicine

## 2023-04-13 ENCOUNTER — Ambulatory Visit: Payer: Medicare PPO

## 2023-04-13 VITALS — BP 124/64 | HR 98 | Temp 97.6°F | Ht 65.0 in | Wt 161.4 lb

## 2023-04-13 DIAGNOSIS — Z0000 Encounter for general adult medical examination without abnormal findings: Secondary | ICD-10-CM | POA: Diagnosis not present

## 2023-04-13 NOTE — Patient Instructions (Signed)
Christina Hernandez , Thank you for taking time to come for your Medicare Wellness Visit. I appreciate your ongoing commitment to your health goals. Please review the following plan we discussed and let me know if I can assist you in the future.   Referrals/Orders/Follow-Ups/Clinician Recommendations: none  This is a list of the screening recommended for you and due dates:  Health Maintenance  Topic Date Due   Yearly kidney health urinalysis for diabetes  02/24/2023   COVID-19 Vaccine (7 - 2023-24 season) 04/20/2023   Eye exam for diabetics  06/02/2023   Complete foot exam   07/02/2023   Hemoglobin A1C  08/12/2023   Yearly kidney function blood test for diabetes  02/09/2024   Medicare Annual Wellness Visit  04/12/2024   Mammogram  10/27/2024   Colon Cancer Screening  02/08/2027   DTaP/Tdap/Td vaccine (3 - Td or Tdap) 03/23/2032   Pneumonia Vaccine  Completed   Flu Shot  Completed   DEXA scan (bone density measurement)  Completed   Hepatitis C Screening  Completed   Zoster (Shingles) Vaccine  Completed   HPV Vaccine  Aged Out    Advanced directives: (Provided) Advance directive discussed with you today. I have provided a copy for you to complete at home and have notarized. Once this is complete, please bring a copy in to our office so we can scan it into your chart.   Next Medicare Annual Wellness Visit scheduled for next year: Yes  Insert Preventive Care attachment Insert FALL PREVENTION attachment if needed

## 2023-04-13 NOTE — Progress Notes (Signed)
Subjective:   Christina Hernandez is a 72 y.o. female who presents for Medicare Annual (Subsequent) preventive examination.  Visit Complete: In person    Cardiac Risk Factors include: advanced age (>36men, >72 women);diabetes mellitus;hypertension     Objective:    Today's Vitals   04/13/23 1157  BP: 124/64  Pulse: 98  Temp: 97.6 F (36.4 C)  TempSrc: Oral  SpO2: 97%  Weight: 161 lb 6.4 oz (73.2 kg)  Height: 5\' 5"  (1.651 m)   Body mass index is 26.86 kg/m.     04/13/2023   12:05 PM 11/09/2022    5:21 AM 04/07/2022    2:22 PM 03/12/2021   10:57 AM 07/11/2020   11:17 AM 03/12/2020   11:38 AM 04/17/2019    1:35 PM  Advanced Directives  Does Patient Have a Medical Advance Directive? No No No No No No No  Would patient like information on creating a medical advance directive? Yes (MAU/Ambulatory/Procedural Areas - Information given)  No - Patient declined No - Patient declined  Yes (MAU/Ambulatory/Procedural Areas - Information given) No - Patient declined    Current Medications (verified) Outpatient Encounter Medications as of 04/13/2023  Medication Sig   anastrozole (ARIMIDEX) 1 MG tablet TAKE 1 TABLET BY MOUTH EVERY DAY   baclofen (LIORESAL) 10 MG tablet TAKE 1 TABLET BY MOUTH THREE TIMES A DAY AS NEEDED FOR MUSCLE SPASM   COMBIGAN 0.2-0.5 % ophthalmic solution Place 1 drop into both eyes 2 (two) times daily.   DULoxetine (CYMBALTA) 60 MG capsule TAKE 1 CAPSULE BY MOUTH EVERY DAY (Patient taking differently: TAKE 2 CAPSULE BY MOUTH EVERY DAY. Patient reports once in the morning & once in the evening.)   FARXIGA 10 MG TABS tablet TAKE 1 TABLET BY MOUTH DAILY BEFORE BREAKFAST.   gabapentin (NEURONTIN) 300 MG capsule TAKE 3 CAPSULES BY MOUTH 3 TIMES DAILY.   glucose blood (CONTOUR NEXT TEST) test strip Use daily to check blood sugars.   latanoprost (XALATAN) 0.005 % ophthalmic solution Place 1 drop into both eyes at bedtime.   levothyroxine (SYNTHROID) 50 MCG tablet  One tab po daily M-Saturday   levothyroxine (SYNTHROID) 75 MCG tablet Take 1 tablet (75 mcg total) by mouth once a week. SUNDAY ONLY   losartan-hydrochlorothiazide (HYZAAR) 50-12.5 MG tablet Take 1 tablet by mouth daily.   metFORMIN (GLUCOPHAGE-XR) 750 MG 24 hr tablet TAKE 1 TABLET BY MOUTH EVERY DAY   pravastatin (PRAVACHOL) 80 MG tablet One tab po qd, skip Sundays   cetirizine (ZYRTEC) 10 MG tablet TAKE 1 TABLET BY MOUTH EVERY DAY (Patient not taking: Reported on 04/13/2023)   Cholecalciferol (VITAMIN D) 125 MCG (5000 UT) CAPS Take 5,000 Units by mouth daily. (Patient not taking: Reported on 04/13/2023)   HYDROcodone-acetaminophen (NORCO/VICODIN) 5-325 MG tablet Take 1 tablet by mouth every 6 (six) hours as needed for severe pain. (Patient not taking: Reported on 04/13/2023)   No facility-administered encounter medications on file as of 04/13/2023.    Allergies (verified) Cyclobenzaprine   History: Past Medical History:  Diagnosis Date   Arthritis    Breast cancer, right breast (HCC)    pending right breast lumpectomy on September 01, 2018   Diabetes mellitus type II, controlled (HCC)    On Kombiglyze   Family history of ovarian cancer    Family history of thyroid cancer    GERD (gastroesophageal reflux disease)    rarely occurs- no meds   Hyperlipidemia associated with type 2 diabetes mellitus (HCC)    Hypertension  Hypothyroidism    Personal history of radiation therapy    Primary localized osteoarthritis of right knee 04/17/2019   S/P knee replacement 04/17/2019   Trigeminal neuralgia of left side of face 2015   s/p Gamma knife   Past Surgical History:  Procedure Laterality Date   BREAST BIOPSY Right 07/24/2018   BREAST LUMPECTOMY Right 09/01/2018   BREAST LUMPECTOMY WITH RADIOACTIVE SEED LOCALIZATION Right 09/01/2018   Procedure: RIGHT BREAST LUMPECTOMY WITH RADIOACTIVE SEED LOCALIZATION;  Surgeon: Ovidio Kin, MD;  Location: [facility];  Service: General;   Laterality: Right;   COLONOSCOPY     Gamma Knife Left 04/08/2016   HYSTEROSCOPY WITH D & C  05/24/2011   Procedure: DILATATION AND CURETTAGE (D&C) /HYSTEROSCOPY;  Surgeon: Geryl Rankins, MD;  Location: [facility];  Service: Gynecology;  Laterality: N/A;   KNEE SURGERY Right 06/10/15   torn ligament   PARTIAL KNEE ARTHROPLASTY Right 04/17/2019   Procedure: UNICOMPARTMENTAL KNEE;  Surgeon: Teryl Lucy, MD;  Location: [facility];  Service: Orthopedics;  Laterality: Right;   THYROIDECTOMY N/A 09/08/2016   Procedure: TOTAL THYROIDECTOMY;  Surgeon: Newman Pies, MD;  Location: [facility];  Service: ENT;  Laterality: N/A;   TUBAL LIGATION     uterine polyp     WISDOM TOOTH EXTRACTION Left 2014   Family History  Problem Relation Age of Onset   Hypertension Mother    Kidney failure Mother    Congestive Heart Failure Mother    Heart Problems Father    Stroke Father    Pneumonia Father    Heart Problems Sister 77       heart transplant   Stroke Paternal Grandmother    Dementia Paternal Grandmother    Stroke Paternal Grandfather    Cardiomyopathy Sister    Ovarian cancer Sister 79       d. 50   Thyroid disease Sister    Thyroid disease Sister    Thyroid cancer Niece 63   Breast cancer Neg Hx    Social History   Socioeconomic History   Marital status: Widowed    Spouse name: Roe Coombs   Number of children: 4   Years of education: BS   Highest education level: Not on file  Occupational History   Occupation: Retired    Associate Professor: OTHER  Tobacco Use   Smoking status: Never   Smokeless tobacco: Never  Vaping Use   Vaping status: Never Used  Substance and Sexual Activity   Alcohol use: No   Drug use: No   Sexual activity: Not Currently  Other Topics Concern   Not on file  Social History Narrative   Patient lives at home with family.   Caffeine Use: occasionally      Lives at home with husband and son.  Is a retired Manufacturing engineer Strain: Low  Risk  (04/13/2023)   Overall Financial Resource Strain (CARDIA)    Difficulty of Paying Living Expenses: Not hard at all  Food Insecurity: No Food Insecurity (04/13/2023)   Hunger Vital Sign    Worried About Running Out of Food in the Last Year: Never true    Ran Out of Food in the Last Year: Never true  Transportation Needs: No Transportation Needs (04/13/2023)   PRAPARE - Administrator, Civil Service (Medical): No    Lack of Transportation (Non-Medical): No  Physical Activity: Inactive (04/13/2023)   Exercise Vital Sign    Days of Exercise per  Week: 0 days    Minutes of Exercise per Session: 0 min  Stress: No Stress Concern Present (04/13/2023)   [employer] of Occupational Health - Occupational Stress Questionnaire    Feeling of Stress : Not at all  Social Connections: Socially Isolated (04/13/2023)   Social Connection and Isolation Panel [NHANES]    Frequency of Communication with Friends and Family: More than three times a week    Frequency of Social Gatherings with Friends and Family: Twice a week    Attends Religious Services: Never    Database administrator or Organizations: No    Attends Banker Meetings: Never    Marital Status: Widowed    Tobacco Counseling Counseling given: Not Answered   Clinical Intake:  Pre-visit preparation completed: Yes  Pain : No/denies pain     Nutritional Status: BMI 25 -29 Overweight Nutritional Risks: Nausea/ vomitting/ diarrhea (diarrhea yesterday, resolved) Diabetes: Yes CBG done?: No Did pt. bring in CBG monitor from home?: No  How often do you need to have someone help you when you read instructions, pamphlets, or other written materials from your doctor or pharmacy?: 1 - Never  Interpreter Needed?: No  Information entered by :: NAllen LPN   Activities of Daily Living    04/13/2023   11:59 AM  In your present state of health, do you have any difficulty performing the following  activities:  Hearing? 1  Comment turns tv up  Vision? 1  Comment sometimes just can't see print  Difficulty concentrating or making decisions? 0  Walking or climbing stairs? 1  Comment due to knees at times  Dressing or bathing? 0  Doing errands, shopping? 0  Preparing Food and eating ? N  Using the Toilet? N  In the past six months, have you accidently leaked urine? N  Do you have problems with loss of bowel control? N  Managing your Medications? N  Managing your Finances? N  Housekeeping or managing your Housekeeping? N    Patient Care Team: Dorothyann Peng, MD as PCP - General (Internal Medicine) Marykay Lex, MD as PCP - Cardiology (Cardiology) Geryl Rankins, MD as Consulting Physician (Obstetrics and Gynecology) Ovidio Kin, MD as Consulting Physician (General Surgery) Magrinat, Valentino Hue, MD (Inactive) as Consulting Physician (Oncology) Dorothy Puffer, MD as Consulting Physician (Radiation Oncology) Suanne Marker, MD as Consulting Physician (Neurology) Chilton Greathouse, MD as Consulting Physician (Pulmonary Disease) Teryl Lucy, MD as Consulting Physician (Orthopedic Surgery) Odette Fraction, MD (Inactive) as Consulting Physician (Anesthesiology) Chalmers Guest, MD as Consulting Physician (Ophthalmology)  Indicate any recent Medical Services you may have received from other than [facility] providers in the past year (date may be approximate).     Assessment:   This is a routine wellness examination for Rhodia.  Hearing/Vision screen Hearing Screening - Comments:: States has to put tv up Vision Screening - Comments:: Regular eye exams, Dr. Harlon Flor, [facility]   Goals Addressed             This Visit's Progress    Patient Stated       04/13/2023, get off some medication and bring A1C down       Depression Screen    04/13/2023   12:08 PM 02/09/2023   10:26 AM 09/27/2022    3:12 PM 05/11/2022   11:51 AM 04/07/2022    2:25 PM 03/12/2021   10:59 AM  03/12/2020   11:40 AM  PHQ 2/9 Scores  PHQ - 2 Score 0 0 0  0 0 0 0  PHQ- 9 Score 6 0         Fall Risk    04/13/2023   12:07 PM 02/09/2023   10:26 AM 09/27/2022    3:12 PM 05/11/2022   11:51 AM 04/07/2022    2:23 PM  Fall Risk   Falls in the past year? 0 0 0 0 1  Comment     rolled down the hill of her house, fell on the escalator  Number falls in past yr: 0 0 0 0 1  Injury with Fall? 0 0 0 0 0  Risk for fall due to : Medication side effect No Fall Risks No Fall Risks No Fall Risks Medication side effect;Impaired balance/gait  Follow up Falls prevention discussed;Falls evaluation completed Falls evaluation completed Falls evaluation completed Falls evaluation completed Falls prevention discussed;Education provided;Falls evaluation completed    MEDICARE RISK AT HOME: Medicare Risk at Home Any stairs in or around the home?: Yes If so, are there any without handrails?: No Home free of loose throw rugs in walkways, pet beds, electrical cords, etc?: Yes Adequate lighting in your home to reduce risk of falls?: Yes Life alert?: No Use of a cane, walker or w/c?: No Grab bars in the bathroom?: Yes Shower chair or bench in shower?: No Elevated toilet seat or a handicapped toilet?: Yes  TIMED UP AND GO:  Was the test performed?  Yes  Length of time to ambulate 10 feet: 5 sec Gait steady and fast without use of assistive device    Cognitive Function:        04/13/2023   12:12 PM 04/07/2022    2:29 PM 03/12/2021   11:00 AM 03/12/2020   11:41 AM 03/08/2019   12:17 PM  6CIT Screen  What Year? 0 points 0 points 0 points 0 points 0 points  What month? 0 points 0 points 0 points 0 points 0 points  What time? 0 points 0 points 0 points 0 points 0 points  Count back from 20 0 points 4 points 0 points 0 points 0 points  Months in reverse 0 points 0 points 0 points 0 points 0 points  Repeat phrase 2 points 2 points 2 points 0 points 2 points  Total Score 2 points 6 points 2 points 0  points 2 points    Immunizations Immunization History  Administered Date(s) Administered   Fluad Quad(high Dose 65+) 02/27/2019, 03/12/2020, 03/12/2021, 03/23/2022   Influenza, High Dose Seasonal PF 03/27/2018, 02/27/2019   Influenza,inj,Quad PF,6+ Mos 04/12/2016   Influenza-Unspecified 02/23/2023   Moderna Covid-19 Fall Seasonal Vaccine 58yrs & older 02/23/2023   Moderna Sars-Covid-2 Vaccination 08/06/2019, 09/03/2019, 03/31/2020   PFIZER Comirnaty(Gray Top)Covid-19 Tri-Sucrose Vaccine 10/24/2020   Pfizer Covid-19 Vaccine Bivalent Booster 8yrs & up 06/13/2021   Pneumococcal Conjugate-13 03/17/2020   Pneumococcal Polysaccharide-23 05/25/2016   Tdap 05/28/2011, 03/23/2022   Zoster Recombinant(Shingrix) 02/22/2022, 09/27/2022    TDAP status: Up to date  Flu Vaccine status: Up to date  Pneumococcal vaccine status: Up to date  Covid-19 vaccine status: Completed vaccines  Qualifies for Shingles Vaccine? Yes   Zostavax completed Yes   Shingrix Completed?: Yes  Screening Tests Health Maintenance  Topic Date Due   Diabetic kidney evaluation - Urine ACR  02/24/2023   COVID-19 Vaccine (7 - 2023-24 season) 04/20/2023   OPHTHALMOLOGY EXAM  06/02/2023   FOOT EXAM  07/02/2023   HEMOGLOBIN A1C  08/12/2023   Diabetic kidney evaluation - eGFR measurement  02/09/2024   Medicare  Annual Wellness (AWV)  04/12/2024   MAMMOGRAM  10/27/2024   Colonoscopy  02/08/2027   DTaP/Tdap/Td (3 - Td or Tdap) 03/23/2032   Pneumonia Vaccine 35+ Years old  Completed   INFLUENZA VACCINE  Completed   DEXA SCAN  Completed   Hepatitis C Screening  Completed   Zoster Vaccines- Shingrix  Completed   HPV VACCINES  Aged Out    Health Maintenance  Health Maintenance Due  Topic Date Due   Diabetic kidney evaluation - Urine ACR  02/24/2023    Colorectal cancer screening: Type of screening: Colonoscopy. Completed 02/07/2017. Repeat every 10 years  Mammogram status: Completed 10/28/2022. Repeat every  year  Bone Density status: scheduled for 08/10/2023  Lung Cancer Screening: (Low Dose CT Chest recommended if Age 49-80 years, 20 pack-year currently smoking OR have quit w/in 15years.) does not qualify.   Lung Cancer Screening Referral: no  Additional Screening:  Hepatitis C Screening: does qualify; Completed 06/05/2012  Vision Screening: Recommended annual ophthalmology exams for early detection of glaucoma and other disorders of the eye. Is the patient up to date with their annual eye exam?  Yes  Who is the provider or what is the name of the office in which the patient attends annual eye exams? Dr. Harlon Flor If pt is not established with a provider, would they like to be referred to a provider to establish care? No .   Dental Screening: Recommended annual dental exams for proper oral hygiene  Diabetic Foot Exam: Diabetic Foot Exam: Completed 07/01/2022  Community Resource Referral / Chronic Care Management: CRR required this visit?  No   CCM required this visit?  No     Plan:     I have personally reviewed and noted the following in the patient's chart:   Medical and social history Use of alcohol, tobacco or illicit drugs  Current medications and supplements including opioid prescriptions. Patient is not currently taking opioid prescriptions. Functional ability and status Nutritional status Physical activity Advanced directives List of other physicians Hospitalizations, surgeries, and ER visits in previous 12 months Vitals Screenings to include cognitive, depression, and falls Referrals and appointments  In addition, I have reviewed and discussed with patient certain preventive protocols, quality metrics, and best practice recommendations. A written personalized care plan for preventive services as well as general preventive health recommendations were provided to patient.     Barb Merino, LPN   [id number]   After Visit Summary: (In Person-Printed) AVS printed  and given to the patient  Nurse Notes: none

## 2023-04-18 ENCOUNTER — Other Ambulatory Visit: Payer: Self-pay | Admitting: Internal Medicine

## 2023-04-18 DIAGNOSIS — E890 Postprocedural hypothyroidism: Secondary | ICD-10-CM

## 2023-05-02 DIAGNOSIS — G5132 Clonic hemifacial spasm, left: Secondary | ICD-10-CM | POA: Diagnosis not present

## 2023-05-02 DIAGNOSIS — G519 Disorder of facial nerve, unspecified: Secondary | ICD-10-CM | POA: Diagnosis not present

## 2023-05-11 DIAGNOSIS — H401133 Primary open-angle glaucoma, bilateral, severe stage: Secondary | ICD-10-CM | POA: Diagnosis not present

## 2023-05-11 DIAGNOSIS — E119 Type 2 diabetes mellitus without complications: Secondary | ICD-10-CM | POA: Diagnosis not present

## 2023-05-11 DIAGNOSIS — H02403 Unspecified ptosis of bilateral eyelids: Secondary | ICD-10-CM | POA: Diagnosis not present

## 2023-05-11 DIAGNOSIS — G500 Trigeminal neuralgia: Secondary | ICD-10-CM | POA: Diagnosis not present

## 2023-05-11 LAB — HM DIABETES EYE EXAM

## 2023-05-21 ENCOUNTER — Other Ambulatory Visit: Payer: Self-pay | Admitting: Internal Medicine

## 2023-05-22 ENCOUNTER — Other Ambulatory Visit: Payer: Self-pay | Admitting: Internal Medicine

## 2023-05-22 DIAGNOSIS — E1165 Type 2 diabetes mellitus with hyperglycemia: Secondary | ICD-10-CM

## 2023-06-02 ENCOUNTER — Other Ambulatory Visit: Payer: Self-pay

## 2023-06-02 DIAGNOSIS — E1169 Type 2 diabetes mellitus with other specified complication: Secondary | ICD-10-CM

## 2023-06-02 MED ORDER — PRAVASTATIN SODIUM 80 MG PO TABS
ORAL_TABLET | ORAL | 2 refills | Status: DC
Start: 2023-06-02 — End: 2023-09-13

## 2023-06-13 ENCOUNTER — Ambulatory Visit: Payer: Medicare PPO | Admitting: Internal Medicine

## 2023-06-16 ENCOUNTER — Ambulatory Visit: Payer: Medicare PPO | Admitting: Internal Medicine

## 2023-06-16 ENCOUNTER — Encounter: Payer: Self-pay | Admitting: Internal Medicine

## 2023-06-16 VITALS — BP 116/70 | HR 95 | Temp 98.5°F | Ht 65.0 in | Wt 163.0 lb

## 2023-06-16 DIAGNOSIS — E1169 Type 2 diabetes mellitus with other specified complication: Secondary | ICD-10-CM

## 2023-06-16 DIAGNOSIS — E039 Hypothyroidism, unspecified: Secondary | ICD-10-CM

## 2023-06-16 DIAGNOSIS — H6121 Impacted cerumen, right ear: Secondary | ICD-10-CM

## 2023-06-16 DIAGNOSIS — R109 Unspecified abdominal pain: Secondary | ICD-10-CM | POA: Diagnosis not present

## 2023-06-16 DIAGNOSIS — I700 Atherosclerosis of aorta: Secondary | ICD-10-CM | POA: Diagnosis not present

## 2023-06-16 DIAGNOSIS — E785 Hyperlipidemia, unspecified: Secondary | ICD-10-CM | POA: Diagnosis not present

## 2023-06-16 DIAGNOSIS — C50011 Malignant neoplasm of nipple and areola, right female breast: Secondary | ICD-10-CM

## 2023-06-16 DIAGNOSIS — K760 Fatty (change of) liver, not elsewhere classified: Secondary | ICD-10-CM

## 2023-06-16 DIAGNOSIS — I119 Hypertensive heart disease without heart failure: Secondary | ICD-10-CM

## 2023-06-16 DIAGNOSIS — H6123 Impacted cerumen, bilateral: Secondary | ICD-10-CM

## 2023-06-16 NOTE — Patient Instructions (Signed)

## 2023-06-16 NOTE — Progress Notes (Signed)
I,Victoria T Deloria Lair, CMA,acting as a Neurosurgeon for [name], MD.,have documented all relevant documentation on the behalf of [name], MD,as directed by  [name], MD while in the presence of [name], MD.  Subjective:  Patient ID: Christina Hernandez , female    DOB: June 19, 1951 , 72 y.o.   MRN: [id number]  Chief Complaint  Patient presents with   Diabetes   Hypertension   Hypothyroidism    HPI  Patient presents today for DM and BP & thyroid check. Patient reports compliance with medications. She denies having any headaches, chest pain and shortness of breath.   She reports her right side of her stomach feels painful when she does a bending motion. She is not sure what is triggering her sx. No associated n/v/belching.   Also while here today, she would like her ears checked to see if they need to be flushed.    Diabetes She presents for her follow-up diabetic visit. She has type 2 diabetes mellitus. There are no hypoglycemic associated symptoms. There are no diabetic associated symptoms. Pertinent negatives for diabetes include no blurred vision and no chest pain. There are no hypoglycemic complications. Risk factors for coronary artery disease include diabetes mellitus, dyslipidemia, hypertension, sedentary lifestyle and post-menopausal. She participates in exercise intermittently. An ACE inhibitor/angiotensin II receptor blocker is being taken. Eye exam is current.  Hypertension This is a chronic problem. The current episode started more than 1 year ago. The problem has been gradually improving since onset. The problem is controlled. Pertinent negatives include no blurred vision, chest pain, palpitations or shortness of breath. Risk factors for coronary artery disease include diabetes mellitus, dyslipidemia, post-menopausal state and sedentary lifestyle. The current treatment provides moderate improvement. Compliance problems include exercise.      Past  Medical History:  Diagnosis Date   Arthritis    Breast cancer, right breast (HCC)    pending right breast lumpectomy on September 01, 2018   Diabetes mellitus type II, controlled (HCC)    On Kombiglyze   Family history of ovarian cancer    Family history of thyroid cancer    GERD (gastroesophageal reflux disease)    rarely occurs- no meds   Hyperlipidemia associated with type 2 diabetes mellitus (HCC)    Hypertension    Hypothyroidism    Personal history of radiation therapy    Primary localized osteoarthritis of right knee 04/17/2019   S/P knee replacement 04/17/2019   Trigeminal neuralgia of left side of face 2015   s/p Gamma knife     Family History  Problem Relation Age of Onset   Hypertension Mother    Kidney failure Mother    Congestive Heart Failure Mother    Heart Problems Father    Stroke Father    Pneumonia Father    Heart Problems Sister 72       heart transplant   Stroke Paternal Grandmother    Dementia Paternal Grandmother    Stroke Paternal Grandfather    Cardiomyopathy Sister    Ovarian cancer Sister 60       d. 93   Thyroid disease Sister    Thyroid disease Sister    Thyroid cancer Niece 72   Breast cancer Neg Hx      Current Outpatient Medications:    anastrozole (ARIMIDEX) 1 MG tablet, TAKE 1 TABLET BY MOUTH EVERY DAY, Disp: 90 tablet, Rfl: 4   baclofen (LIORESAL) 10 MG tablet, TAKE 1 TABLET BY MOUTH THREE TIMES  A DAY AS NEEDED FOR MUSCLE SPASM, Disp: 270 tablet, Rfl: 0   COMBIGAN 0.2-0.5 % ophthalmic solution, Place 1 drop into both eyes 2 (two) times daily., Disp: , Rfl:    DULoxetine (CYMBALTA) 60 MG capsule, TAKE 1 CAPSULE BY MOUTH EVERY DAY, Disp: 90 capsule, Rfl: 1   FARXIGA 10 MG TABS tablet, TAKE 1 TABLET BY MOUTH DAILY BEFORE BREAKFAST., Disp: 90 tablet, Rfl: 1   gabapentin (NEURONTIN) 300 MG capsule, TAKE 3 CAPSULES BY MOUTH 3 TIMES DAILY., Disp: 270 capsule, Rfl: 6   glucose blood (CONTOUR NEXT TEST) test strip, Use daily to check blood  sugars., Disp: 100 each, Rfl: 2   latanoprost (XALATAN) 0.005 % ophthalmic solution, Place 1 drop into both eyes at bedtime., Disp: , Rfl:    levothyroxine (SYNTHROID) 50 MCG tablet, One tab po daily M-Saturday, Disp: 75 tablet, Rfl: 2   levothyroxine (SYNTHROID) 75 MCG tablet, TAKE 1 TABLET (75 MCG TOTAL) BY MOUTH ONCE A WEEK. SUNDAY ONLY, Disp: 12 tablet, Rfl: 1   losartan-hydrochlorothiazide (HYZAAR) 50-12.5 MG tablet, Take 1 tablet by mouth daily., Disp: 90 tablet, Rfl: 1   metFORMIN (GLUCOPHAGE-XR) 750 MG 24 hr tablet, TAKE 1 TABLET BY MOUTH EVERY DAY, Disp: 90 tablet, Rfl: 1   pravastatin (PRAVACHOL) 80 MG tablet, One tab po qd, skip Sundays, Disp: 75 tablet, Rfl: 2   cetirizine (ZYRTEC) 10 MG tablet, TAKE 1 TABLET BY MOUTH EVERY DAY (Patient not taking: Reported on 06/16/2023), Disp: 30 tablet, Rfl: 0   Cholecalciferol (VITAMIN D) 125 MCG (5000 UT) CAPS, Take 5,000 Units by mouth daily. (Patient not taking: Reported on 04/13/2023), Disp: , Rfl:    HYDROcodone-acetaminophen (NORCO/VICODIN) 5-325 MG tablet, Take 1 tablet by mouth every 6 (six) hours as needed for severe pain. (Patient not taking: Reported on 06/16/2023), Disp: 12 tablet, Rfl: 0   Allergies  Allergen Reactions   Cyclobenzaprine Nausea Only     Review of Systems  Constitutional: Negative.   Eyes:  Negative for blurred vision.  Respiratory: Negative.  Negative for shortness of breath.   Cardiovascular: Negative.  Negative for chest pain and palpitations.  Gastrointestinal: Negative.   Neurological: Negative.   Psychiatric/Behavioral: Negative.       Today's Vitals   06/16/23 1536  BP: 116/70  Pulse: 95  Temp: 98.5 F (36.9 C)  SpO2: 98%  Weight: 163 lb (73.9 kg)  Height: 5\' 5"  (1.651 m)   Body mass index is 27.12 kg/m.  Wt Readings from Last 3 Encounters:  06/16/23 163 lb (73.9 kg)  04/13/23 161 lb 6.4 oz (73.2 kg)  02/09/23 162 lb 12.8 oz (73.8 kg)     Objective:  Physical Exam Vitals and nursing note  reviewed.  Constitutional:      Appearance: Normal appearance.  HENT:     Head: Normocephalic and atraumatic.     Right Ear: Ear canal and external ear normal. There is impacted cerumen.     Left Ear: Ear canal and external ear normal. There is no impacted cerumen.  Cardiovascular:     Rate and Rhythm: Normal rate and regular rhythm.     Heart sounds: Normal heart sounds.  Pulmonary:     Effort: Pulmonary effort is normal.     Breath sounds: Normal breath sounds.  Abdominal:     General: Bowel sounds are normal.     Palpations: Abdomen is soft.     Tenderness: There is no right CVA tenderness or left CVA tenderness.  Skin:  General: Skin is warm.  Neurological:     General: No focal deficit present.     Mental Status: She is alert.  Psychiatric:        Mood and Affect: Mood normal.        Behavior: Behavior normal.         Assessment And Plan:  Type 2 diabetes mellitus with hyperlipidemia (HCC) Assessment & Plan: Chronic, LDL goal is less than 70.  She will c/w pravastatin 80mg  daily.  She will continue with metformin XR 750mg  and Farxiga 10mg  daily for her diabetes. She is encouraged to avoid refined carbs. She will f/u in 3-4 months for re-evaluation.   Orders: -     Hemoglobin A1c -     Microalbumin / creatinine urine ratio -     TSH + free T4 -     BMP8+EGFR  Hypertensive heart disease without heart failure Assessment & Plan: Chronic, well controlled.  She will continue with losartan/hct 50/12.5mg  daily. She is reminded to follow low sodium diet.   Orders: -     BMP8+EGFR  Atherosclerosis of aorta (HCC) Assessment & Plan: Chronic, LDL goal< 70.  She will c/w pravastatin and encouraged to follow heart healthy lifestyle.    Primary hypothyroidism Assessment & Plan: Chronic, I will check thyroid panel and adjust meds as needed.   Orders: -     TSH + free T4  Flank pain Assessment & Plan: Sx are intermittent. She is encouraged to aim for at least 60  ounces of water intake/day. I did order u/a; however, specimen not run. Pt encouraged to contact office if sx persist.    Impacted cerumen of right ear Assessment & Plan: AFTER OBTAINING VERBAL CONSENT, RIGHT EAR WAS FLUSHED BY IRRIGATION. SHE TOLERATED PROCEDURE WELL WITHOUT ANY COMPLICATIONS. NO TM ABNORMALITIES WERE NOTED.   Orders: -     Ear Lavage     Return if symptoms worsen or fail to improve.  Patient was given opportunity to ask questions. Patient verbalized understanding of the plan and was able to repeat key elements of the plan. All questions were answered to their satisfaction.    I, [name], MD, have reviewed all documentation for this visit. The documentation on 06/28/23 for the exam, diagnosis, procedures, and orders are all accurate and complete.   IF YOU HAVE BEEN REFERRED TO A SPECIALIST, IT MAY TAKE 1-2 WEEKS TO SCHEDULE/PROCESS THE REFERRAL. IF YOU HAVE NOT HEARD FROM US/SPECIALIST IN TWO WEEKS, PLEASE GIVE [place] A CALL AT [phone] X [phone].   THE PATIENT IS ENCOURAGED TO PRACTICE SOCIAL DISTANCING DUE TO THE COVID-19 PANDEMIC.

## 2023-06-17 LAB — BMP8+EGFR
BUN/Creatinine Ratio: 13 (ref 12–28)
BUN: 11 mg/dL (ref 8–27)
CO2: 24 mmol/L (ref 20–29)
Calcium: 9.4 mg/dL (ref 8.7–10.3)
Chloride: 101 mmol/L (ref 96–106)
Creatinine, Ser: 0.82 mg/dL (ref 0.57–1.00)
Glucose: 126 mg/dL — ABNORMAL HIGH (ref 70–99)
Potassium: 3.5 mmol/L (ref 3.5–5.2)
Sodium: 142 mmol/L (ref 134–144)
eGFR: 75 mL/min/{1.73_m2} (ref >59)

## 2023-06-17 LAB — HEMOGLOBIN A1C
Est. average glucose Bld gHb Est-mCnc: 137 mg/dL
Hgb A1c MFr Bld: 6.4 % — ABNORMAL HIGH (ref 4.8–5.6)

## 2023-06-17 LAB — TSH+FREE T4
Free T4: 1.18 ng/dL (ref 0.82–1.77)
TSH: 0.284 u[IU]/mL — ABNORMAL LOW (ref 0.450–4.500)

## 2023-06-17 LAB — MICROALBUMIN / CREATININE URINE RATIO
Creatinine, Urine: 74 mg/dL
Microalb/Creat Ratio: 13 mg/g{creat} (ref 0–29)
Microalbumin, Urine: 9.9 ug/mL

## 2023-06-28 DIAGNOSIS — R109 Unspecified abdominal pain: Secondary | ICD-10-CM | POA: Insufficient documentation

## 2023-06-28 NOTE — Assessment & Plan Note (Signed)
Chronic, I will check thyroid panel and adjust meds as needed.

## 2023-06-28 NOTE — Assessment & Plan Note (Signed)
Sx are intermittent. She is encouraged to aim for at least 60 ounces of water intake/day. I did order u/a; however, specimen not run. Pt encouraged to contact office if sx persist.

## 2023-06-28 NOTE — Assessment & Plan Note (Signed)
Chronic, LDL goal< 70.  She will c/w pravastatin and encouraged to follow heart healthy lifestyle.

## 2023-06-28 NOTE — Assessment & Plan Note (Signed)
Chronic, LDL goal is less than 70.  She will c/w pravastatin 80mg  daily.  She will continue with metformin XR 750mg  and Farxiga 10mg  daily for her diabetes. She is encouraged to avoid refined carbs. She will f/u in 3-4 months for re-evaluation.

## 2023-06-28 NOTE — Assessment & Plan Note (Signed)
AFTER OBTAINING VERBAL CONSENT, RIGHT EAR WAS FLUSHED BY IRRIGATION. SHE TOLERATED PROCEDURE WELL WITHOUT ANY COMPLICATIONS. NO TM ABNORMALITIES WERE NOTED.

## 2023-06-28 NOTE — Assessment & Plan Note (Signed)
Chronic, well controlled.  She will continue with losartan/hct 50/12.5mg  daily. She is reminded to follow low sodium diet.

## 2023-07-05 ENCOUNTER — Ambulatory Visit: Payer: Medicare PPO

## 2023-07-05 VITALS — BP 120/70 | HR 80 | Temp 98.6°F | Ht 65.0 in | Wt 163.0 lb

## 2023-07-05 DIAGNOSIS — E1169 Type 2 diabetes mellitus with other specified complication: Secondary | ICD-10-CM

## 2023-07-05 MED ORDER — DEXCOM G7 SENSOR MISC: 2 refills | Status: DC

## 2023-07-05 NOTE — Progress Notes (Signed)
 Patient presents today for dexcom teaching, patient verbalized understanding. Patient reports she would call back if she had any questions.

## 2023-07-18 ENCOUNTER — Other Ambulatory Visit: Payer: Self-pay | Admitting: Internal Medicine

## 2023-07-19 ENCOUNTER — Encounter: Payer: Self-pay | Admitting: Internal Medicine

## 2023-07-19 ENCOUNTER — Ambulatory Visit: Payer: Medicare PPO | Admitting: Internal Medicine

## 2023-07-19 VITALS — BP 130/80 | HR 106 | Temp 98.2°F | Ht 65.0 in | Wt 165.0 lb

## 2023-07-19 DIAGNOSIS — I700 Atherosclerosis of aorta: Secondary | ICD-10-CM | POA: Diagnosis not present

## 2023-07-19 DIAGNOSIS — E039 Hypothyroidism, unspecified: Secondary | ICD-10-CM | POA: Diagnosis not present

## 2023-07-19 DIAGNOSIS — E785 Hyperlipidemia, unspecified: Secondary | ICD-10-CM | POA: Diagnosis not present

## 2023-07-19 DIAGNOSIS — C50011 Malignant neoplasm of nipple and areola, right female breast: Secondary | ICD-10-CM

## 2023-07-19 DIAGNOSIS — H6121 Impacted cerumen, right ear: Secondary | ICD-10-CM | POA: Diagnosis not present

## 2023-07-19 DIAGNOSIS — Z0000 Encounter for general adult medical examination without abnormal findings: Secondary | ICD-10-CM

## 2023-07-19 DIAGNOSIS — E1169 Type 2 diabetes mellitus with other specified complication: Secondary | ICD-10-CM | POA: Diagnosis not present

## 2023-07-19 DIAGNOSIS — I119 Hypertensive heart disease without heart failure: Secondary | ICD-10-CM | POA: Diagnosis not present

## 2023-07-19 DIAGNOSIS — R350 Frequency of micturition: Secondary | ICD-10-CM

## 2023-07-19 LAB — POCT URINALYSIS DIPSTICK
Bilirubin, UA: NEGATIVE
Blood, UA: NEGATIVE
Glucose, UA: POSITIVE — AB
Ketones, UA: NEGATIVE
Leukocytes, UA: NEGATIVE
Nitrite, UA: NEGATIVE
Protein, UA: POSITIVE — AB
Spec Grav, UA: 1.015 (ref 1.010–1.025)
Urobilinogen, UA: 0.2 U/dL
pH, UA: 6 (ref 5.0–8.0)

## 2023-07-19 NOTE — Patient Instructions (Signed)

## 2023-07-19 NOTE — Assessment & Plan Note (Signed)
Chronic, LDL goal is less than 70.  Diabetic foot exam was performed.  She will c/w pravastatin 80mg  daily.  She will continue with metformin XR 750mg  and Farxiga 10mg  daily for her diabetes. She is encouraged to avoid refined carbs. She will f/u in 3-4 months for re-evaluation. I DISCUSSED WITH THE PATIENT AT LENGTH REGARDING THE GOALS OF GLYCEMIC CONTROL AND POSSIBLE LONG-TERM COMPLICATIONS.  I  ALSO STRESSED THE IMPORTANCE OF COMPLIANCE WITH HOME GLUCOSE MONITORING, DIETARY RESTRICTIONS INCLUDING AVOIDANCE OF SUGARY DRINKS/PROCESSED FOODS,  ALONG WITH REGULAR EXERCISE.  I  ALSO STRESSED THE IMPORTANCE OF ANNUAL EYE EXAMS, SELF FOOT CARE AND COMPLIANCE WITH OFFICE VISITS.

## 2023-07-19 NOTE — Assessment & Plan Note (Signed)
Chronic, LDL goal< 70.  She will c/w pravastatin and encouraged to follow heart healthy lifestyle.

## 2023-07-19 NOTE — Progress Notes (Signed)
I,Jameka J Llittleton, CMA,acting as a Neurosurgeon for [name], MD.,have documented all relevant documentation on the behalf of [name], MD,as directed by  [name], MD while in the presence of [name], MD.  Subjective:    Patient ID: Christina Hernandez , female    DOB: 06-09-1951 , 73 y.o.   MRN: [id number]  Chief Complaint  Patient presents with   Annual Exam   Diabetes   Hypertension    HPI  She is here today for a full physical examination. She is followed by [facility] Sprang for her GYN exams. She reports compliance with meds. She denies having headaches, chest pain and shortness of breath. She has no specific concerns or complaints at this time.    Diabetes She presents for her follow-up diabetic visit. She has type 2 diabetes mellitus. There are no hypoglycemic associated symptoms. Pertinent negatives for diabetes include no blurred vision. There are no hypoglycemic complications. Risk factors for coronary artery disease include diabetes mellitus, dyslipidemia, hypertension, sedentary lifestyle and post-menopausal. She participates in exercise intermittently. An ACE inhibitor/angiotensin II receptor blocker is being taken. Eye exam is current.  Hypertension This is a chronic problem. The current episode started more than 1 year ago. The problem has been gradually improving since onset. The problem is controlled. Pertinent negatives include no blurred vision. Compliance problems include exercise.      Past Medical History:  Diagnosis Date   Arthritis    Breast cancer, right breast (HCC)    pending right breast lumpectomy on September 01, 2018   Diabetes mellitus type II, controlled (HCC)    On Kombiglyze   Family history of ovarian cancer    Family history of thyroid cancer    GERD (gastroesophageal reflux disease)    rarely occurs- no meds   Hyperlipidemia associated with type 2 diabetes mellitus (HCC)    Hypertension    Hypothyroidism    Personal history of  radiation therapy    Primary localized osteoarthritis of right knee 04/17/2019   S/P knee replacement 04/17/2019   Trigeminal neuralgia of left side of face 2015   s/p Gamma knife     Family History  Problem Relation Age of Onset   Hypertension Mother    Kidney failure Mother    Congestive Heart Failure Mother    Heart Problems Father    Stroke Father    Pneumonia Father    Heart Problems Sister 62       heart transplant   Stroke Paternal Grandmother    Dementia Paternal Grandmother    Stroke Paternal Grandfather    Cardiomyopathy Sister    Ovarian cancer Sister 60       d. 65   Thyroid disease Sister    Thyroid disease Sister    Thyroid cancer Niece 79   Breast cancer Neg Hx      Current Outpatient Medications:    anastrozole (ARIMIDEX) 1 MG tablet, TAKE 1 TABLET BY MOUTH EVERY DAY, Disp: 90 tablet, Rfl: 4   baclofen (LIORESAL) 10 MG tablet, TAKE 1 TABLET BY MOUTH THREE TIMES A DAY AS NEEDED FOR MUSCLE SPASM, Disp: 270 tablet, Rfl: 0   cetirizine (ZYRTEC) 10 MG tablet, TAKE 1 TABLET BY MOUTH EVERY DAY, Disp: 30 tablet, Rfl: 0   Cholecalciferol (VITAMIN D) 125 MCG (5000 UT) CAPS, Take 5,000 Units by mouth daily., Disp: , Rfl:    COMBIGAN 0.2-0.5 % ophthalmic solution, Place 1 drop into both eyes 2 (two) times daily.,  Disp: , Rfl:    DULoxetine (CYMBALTA) 60 MG capsule, TAKE 1 CAPSULE BY MOUTH EVERY DAY, Disp: 90 capsule, Rfl: 1   FARXIGA 10 MG TABS tablet, TAKE 1 TABLET BY MOUTH DAILY BEFORE BREAKFAST., Disp: 90 tablet, Rfl: 1   gabapentin (NEURONTIN) 300 MG capsule, TAKE 3 CAPSULES BY MOUTH 3 TIMES DAILY., Disp: 270 capsule, Rfl: 6   glucose blood (CONTOUR NEXT TEST) test strip, Use daily to check blood sugars., Disp: 100 each, Rfl: 2   latanoprost (XALATAN) 0.005 % ophthalmic solution, Place 1 drop into both eyes at bedtime., Disp: , Rfl:    levothyroxine (SYNTHROID) 50 MCG tablet, One tab po daily M-Saturday, Disp: 75 tablet, Rfl: 2   levothyroxine (SYNTHROID) 75 MCG  tablet, TAKE 1 TABLET (75 MCG TOTAL) BY MOUTH ONCE A WEEK. SUNDAY ONLY, Disp: 12 tablet, Rfl: 1   losartan-hydrochlorothiazide (HYZAAR) 50-12.5 MG tablet, TAKE 1 TABLET BY MOUTH EVERY DAY, Disp: 90 tablet, Rfl: 1   metFORMIN (GLUCOPHAGE-XR) 750 MG 24 hr tablet, TAKE 1 TABLET BY MOUTH EVERY DAY, Disp: 90 tablet, Rfl: 1   pravastatin (PRAVACHOL) 80 MG tablet, One tab po qd, skip Sundays, Disp: 75 tablet, Rfl: 2   HYDROcodone-acetaminophen (NORCO/VICODIN) 5-325 MG tablet, Take 1 tablet by mouth every 6 (six) hours as needed for severe pain. (Patient not taking: Reported on 07/19/2023), Disp: 12 tablet, Rfl: 0   Allergies  Allergen Reactions   Cyclobenzaprine Nausea Only      The patient states she uses post menopausal status for birth control. No LMP recorded. Patient is postmenopausal.. Negative for Dysmenorrhea. Negative for: breast discharge, breast lump(s), breast pain and breast self exam. Associated symptoms include abnormal vaginal bleeding. Pertinent negatives include abnormal bleeding (hematology), anxiety, decreased libido, depression, difficulty falling sleep, dyspareunia, history of infertility, nocturia, sexual dysfunction, sleep disturbances, urinary incontinence, urinary urgency, vaginal discharge and vaginal itching. Diet regular.The patient states her exercise level is  intermittent.  . The patient's tobacco use is:  Social History   Tobacco Use  Smoking Status Never  Smokeless Tobacco Never  . She has been exposed to passive smoke. The patient's alcohol use is:  Social History   Substance and Sexual Activity  Alcohol Use No    Review of Systems  Constitutional: Negative.   HENT: Negative.         She would like to have her ears flushed if needed.   Eyes: Negative.  Negative for blurred vision.  Respiratory: Negative.    Cardiovascular: Negative.  Negative for leg swelling.  Gastrointestinal: Negative.   Endocrine: Negative.   Genitourinary: Negative.    Musculoskeletal: Negative.   Skin: Negative.   Allergic/Immunologic: Negative.   Neurological: Negative.   Hematological: Negative.   Psychiatric/Behavioral: Negative.       Today's Vitals   07/19/23 1417  BP: 130/80  Pulse: (!) 106  Temp: 98.2 F (36.8 C)  Weight: 165 lb (74.8 kg)  Height: 5\' 5"  (1.651 m)  PainSc: 0-No pain   Body mass index is 27.46 kg/m.  Wt Readings from Last 3 Encounters:  07/19/23 165 lb (74.8 kg)  07/05/23 163 lb (73.9 kg)  06/16/23 163 lb (73.9 kg)     Objective:  Physical Exam Vitals and nursing note reviewed.  Constitutional:      Appearance: Normal appearance.  HENT:     Head: Normocephalic and atraumatic.     Right Ear: Tympanic membrane, ear canal and external ear normal.     Left Ear: Tympanic membrane, ear canal  and external ear normal.     Nose: Nose normal.     Mouth/Throat:     Mouth: Mucous membranes are moist.     Pharynx: Oropharynx is clear.  Eyes:     Extraocular Movements: Extraocular movements intact.     Conjunctiva/sclera: Conjunctivae normal.     Pupils: Pupils are equal, round, and reactive to light.  Cardiovascular:     Rate and Rhythm: Normal rate and regular rhythm.     Pulses: Normal pulses.          Dorsalis pedis pulses are 2+ on the right side and 2+ on the left side.     Heart sounds: Normal heart sounds.  Pulmonary:     Effort: Pulmonary effort is normal.     Breath sounds: Normal breath sounds.  Chest:  Breasts:    Tanner Score is 5.     Left: Normal.     Comments: Healed scar on right breast Abdominal:     General: Abdomen is flat. Bowel sounds are normal.     Palpations: Abdomen is soft.  Genitourinary:    Comments: deferred Musculoskeletal:        General: Normal range of motion.     Cervical back: Normal range of motion and neck supple.  Feet:     Right foot:     Protective Sensation: 5 sites tested.  5 sites sensed.     Skin integrity: Dry skin present.     Toenail Condition: Right  toenails are long.     Left foot:     Protective Sensation: 5 sites tested.  5 sites sensed.     Skin integrity: Dry skin present.     Toenail Condition: Left toenails are long.  Skin:    General: Skin is warm and dry.  Neurological:     General: No focal deficit present.     Mental Status: She is alert and oriented to person, place, and time.  Psychiatric:        Mood and Affect: Mood normal.        Behavior: Behavior normal.         Assessment And Plan:     Encounter for general adult medical examination w/o abnormal findings Assessment & Plan: A full exam was performed.  Importance of monthly self breast exams was discussed with the patient.  She is advised to get 30-45 minutes of regular exercise, no less than four to five days per week. Both weight-bearing and aerobic exercises are recommended.  She is advised to follow a healthy diet with at least six fruits/veggies per day, decrease intake of red meat and other saturated fats and to increase fish intake to twice weekly.  Meats/fish should not be fried -- baked, boiled or broiled is preferable. It is also important to cut back on your sugar intake.  Be sure to read labels - try to avoid anything with added sugar, high fructose corn syrup or other sweeteners.  If you must use a sweetener, you can try stevia or monkfruit.  It is also important to avoid artificially sweetened foods/beverages and diet drinks. Lastly, wear SPF 50 sunscreen on exposed skin and when in direct sunlight for an extended period of time.  Be sure to avoid fast food restaurants and aim for at least 60 ounces of water daily.       Type 2 diabetes mellitus with hyperlipidemia (HCC) Assessment & Plan: Chronic, LDL goal is less than 70.  Diabetic foot exam was  performed.  She will c/w pravastatin 80mg  daily.  She will continue with metformin XR 750mg  and Farxiga 10mg  daily for her diabetes. She is encouraged to avoid refined carbs. She will f/u in 3-4 months for  re-evaluation. I DISCUSSED WITH THE PATIENT AT LENGTH REGARDING THE GOALS OF GLYCEMIC CONTROL AND POSSIBLE LONG-TERM COMPLICATIONS.  I  ALSO STRESSED THE IMPORTANCE OF COMPLIANCE WITH HOME GLUCOSE MONITORING, DIETARY RESTRICTIONS INCLUDING AVOIDANCE OF SUGARY DRINKS/PROCESSED FOODS,  ALONG WITH REGULAR EXERCISE.  I  ALSO STRESSED THE IMPORTANCE OF ANNUAL EYE EXAMS, SELF FOOT CARE AND COMPLIANCE WITH OFFICE VISITS.    Hypertensive heart disease without heart failure Assessment & Plan: Chronic, fari control. EKG performed, NSR w/ nonspecific ST depression and nonspecific T abnormality. She will continue with losartan/hct 50/12.5mg  daily. She is reminded to follow low sodium diet.   Orders: -     EKG 12-Lead -     POCT urinalysis dipstick  Atherosclerosis of aorta (HCC) Assessment & Plan: Chronic, LDL goal< 70.  She will c/w pravastatin and encouraged to follow heart healthy lifestyle.    Primary hypothyroidism Assessment & Plan: Chronic, she is currently taking Synthroid M-Sat and on Sundays.  I will check thyroid panel and adjust meds as needed.  She will rto in six months for re-evaluation.     Impacted cerumen of right ear Assessment & Plan: After obtaining verbal consent, right ear was flushed by irrigation. No TM abnormalities were noted. She tolerated procedure well without any complications.     Orders: -     Ear Lavage  Malignant neoplasm of nipple of right breast in female, unspecified estrogen receptor status (HCC) Assessment & Plan: She is s/p right lumpectomy and adjuvant radiation.  She is currently followed by Oncology, most recent note reviewed. She is currently taking anastrozole 1mg  daily.       Return in about 3 months (around 10/17/2023), or dm check, for 1 year physical. Patient was given opportunity to ask questions. Patient verbalized understanding of the plan and was able to repeat key elements of the plan. All questions were answered to their  satisfaction.    I, [name], MD, have reviewed all documentation for this visit. The documentation on 07/19/23 for the exam, diagnosis, procedures, and orders are all accurate and complete.

## 2023-07-19 NOTE — Assessment & Plan Note (Signed)

## 2023-07-24 DIAGNOSIS — R350 Frequency of micturition: Secondary | ICD-10-CM | POA: Insufficient documentation

## 2023-07-24 NOTE — Assessment & Plan Note (Signed)
After obtaining verbal consent, right ear was flushed by irrigation. No TM abnormalities were noted. She tolerated procedure well without any complications.

## 2023-07-24 NOTE — Assessment & Plan Note (Signed)
She is s/p right lumpectomy and adjuvant radiation.  She is currently followed by Oncology, most recent note reviewed. She is currently taking anastrozole 1mg  daily.

## 2023-07-24 NOTE — Assessment & Plan Note (Signed)
Chronic, fari control. EKG performed, NSR w/ nonspecific ST depression and nonspecific T abnormality. She will continue with losartan/hct 50/12.5mg  daily. She is reminded to follow low sodium diet.

## 2023-07-24 NOTE — Assessment & Plan Note (Signed)
Chronic, she is currently taking Synthroid M-Sat and on Sundays.  I will check thyroid panel and adjust meds as needed.  She will rto in six months for re-evaluation.

## 2023-08-10 ENCOUNTER — Encounter (HOSPITAL_COMMUNITY): Payer: Self-pay

## 2023-08-10 ENCOUNTER — Ambulatory Visit
Admission: RE | Admit: 2023-08-10 | Discharge: 2023-08-10 | Disposition: A | Discharge status: Home / Self Care | Payer: Medicare PPO | Source: Ambulatory Visit | Attending: Hematology and Oncology | Admitting: Hematology and Oncology

## 2023-08-10 ENCOUNTER — Ambulatory Visit (HOSPITAL_COMMUNITY)
Admission: EM | Admit: 2023-08-10 | Discharge: 2023-08-10 | Disposition: A | Discharge status: Home / Self Care | Payer: Medicare PPO | Attending: Physician Assistant | Admitting: Physician Assistant

## 2023-08-10 DIAGNOSIS — R109 Unspecified abdominal pain: Secondary | ICD-10-CM

## 2023-08-10 DIAGNOSIS — M940 Chondrocostal junction syndrome [Tietze]: Secondary | ICD-10-CM

## 2023-08-10 DIAGNOSIS — C50011 Malignant neoplasm of nipple and areola, right female breast: Secondary | ICD-10-CM

## 2023-08-10 DIAGNOSIS — Z90722 Acquired absence of ovaries, bilateral: Secondary | ICD-10-CM | POA: Diagnosis not present

## 2023-08-10 DIAGNOSIS — N958 Other specified menopausal and perimenopausal disorders: Secondary | ICD-10-CM | POA: Diagnosis not present

## 2023-08-10 LAB — POCT URINALYSIS DIP (MANUAL ENTRY)
Bilirubin, UA: NEGATIVE
Blood, UA: NEGATIVE
Glucose, UA: >=1000 mg/dL — AB
Ketones, POC UA: NEGATIVE mg/dL
Leukocytes, UA: NEGATIVE
Nitrite, UA: NEGATIVE
Protein Ur, POC: NEGATIVE mg/dL
Spec Grav, UA: 1.015 (ref 1.010–1.025)
Urobilinogen, UA: 0.2 U/dL
pH, UA: 5.5 (ref 5.0–8.0)

## 2023-08-10 NOTE — Discharge Instructions (Addendum)
Your urine results were negative for blood or white blood cells.  This means it is less likely that you are having or passing a kidney stone or have a UTI.  At this time I suspect that you likely have a pulled muscle in the area.  You can take Tylenol as needed for pain management.  I have sent in some stretches for you to do.  These are included in your after visit summary.  You can also use warm compresses and gentle massage as tolerated to help with pain management.  If your symptoms persist or start to become worse I recommend following up with your PCP or you can return here or go to the emergency room per your preference.

## 2023-08-10 NOTE — ED Provider Notes (Signed)
[facility]    CSN: [id number] Arrival date & time: 08/10/23  1545      History   Chief Complaint Chief Complaint  Patient presents with   Flank Pain    HPI Christina Hernandez is a 73 y.o. female.   HPI  She reports she is having right side pain for the past 3-4 days She states she has had similar pain in the past that is colicky in nature and typically resolves but current pain is lingering She states the pain comes and goes and right now it is 2/10. At it's worst it is 8/10 - stabbing in nature  She denies nausea, vomiting ,diarrhea, dysuria She does report reduced appetite - she denies worsening of symptoms with PO intake She states coughing seems to make the pain worse but she is not sure if it is tender to palpation She reports it feels worse when she moves  Aggravating: moving to sit upright Alleviating: nothing  Interventions; nothing   She denies recent illness   Past Medical History:  Diagnosis Date   Arthritis    Breast cancer, right breast (HCC)    pending right breast lumpectomy on September 01, 2018   Diabetes mellitus type II, controlled (HCC)    On Kombiglyze   Family history of ovarian cancer    Family history of thyroid cancer    GERD (gastroesophageal reflux disease)    rarely occurs- no meds   Hyperlipidemia associated with type 2 diabetes mellitus (HCC)    Hypertension    Hypothyroidism    Personal history of radiation therapy    Primary localized osteoarthritis of right knee 04/17/2019   S/P knee replacement 04/17/2019   Trigeminal neuralgia of left side of face 2015   s/p Gamma knife    Patient Active Problem List   Diagnosis Date Noted   Urinary frequency 07/24/2023   Encounter for general adult medical examination w/o abnormal findings 07/19/2023   Flank pain 06/28/2023   Hepatic steatosis 02/10/2023   Hypertensive heart disease without heart failure 09/27/2022   Type 2 diabetes mellitus with hyperlipidemia (HCC)  07/26/2022   Primary insomnia 06/15/2021   Bilateral impacted cerumen 06/15/2021   Glossodynia 01/12/2021   Bruxism 01/12/2021   Atherosclerosis of aorta (HCC) 06/22/2020   Impacted cerumen of right ear 06/22/2020   Malignant neoplasm of nipple of right breast in female, unspecified estrogen receptor status (HCC) 03/12/2020   Primary localized osteoarthritis of right knee 04/17/2019   Genetic testing 08/28/2018   Pre-operative cardiovascular examination 08/24/2018   Nonspecific abnormal electrocardiogram (ECG) (EKG) 08/24/2018   Family history of ovarian cancer    Family history of thyroid cancer    Ductal carcinoma in situ (DCIS) of right breast 07/27/2018   Pure hypercholesterolemia 05/10/2018   Uncontrolled type 2 diabetes mellitus with hyperglycemia (HCC) 05/10/2018   Essential hypertension 05/10/2018   Bilateral primary osteoarthritis of knee 05/10/2018   Primary hypothyroidism 05/10/2018   H/O total thyroidectomy 09/08/2016   Trigeminal neuralgia of left side of face 04/26/2014    Past Surgical History:  Procedure Laterality Date   BREAST BIOPSY Right 07/24/2018   BREAST LUMPECTOMY Right 09/01/2018   BREAST LUMPECTOMY WITH RADIOACTIVE SEED LOCALIZATION Right 09/01/2018   Procedure: RIGHT BREAST LUMPECTOMY WITH RADIOACTIVE SEED LOCALIZATION;  Surgeon: Ovidio Kin, MD;  Location: [facility];  Service: General;  Laterality: Right;   COLONOSCOPY     Gamma Knife Left 04/08/2016   HYSTEROSCOPY WITH D & C  05/24/2011  Procedure: DILATATION AND CURETTAGE (D&C) /HYSTEROSCOPY;  Surgeon: Geryl Rankins, MD;  Location: [facility];  Service: Gynecology;  Laterality: N/A;   KNEE SURGERY Right 06/10/15   torn ligament   PARTIAL KNEE ARTHROPLASTY Right 04/17/2019   Procedure: UNICOMPARTMENTAL KNEE;  Surgeon: Teryl Lucy, MD;  Location: [facility];  Service: Orthopedics;  Laterality: Right;   THYROIDECTOMY N/A 09/08/2016   Procedure: TOTAL THYROIDECTOMY;  Surgeon: Newman Pies, MD;   Location: [facility];  Service: ENT;  Laterality: N/A;   TUBAL LIGATION     uterine polyp     WISDOM TOOTH EXTRACTION Left 2014    OB History   No obstetric history on file.      Home Medications    Prior to Admission medications   Medication Sig Start Date End Date Taking? Authorizing Provider  anastrozole (ARIMIDEX) 1 MG tablet TAKE 1 TABLET BY MOUTH EVERY DAY 03/30/23   Rachel Moulds, MD  baclofen (LIORESAL) 10 MG tablet TAKE 1 TABLET BY MOUTH THREE TIMES A DAY AS NEEDED FOR MUSCLE SPASM 04/18/23   Dorothyann Peng, MD  cetirizine (ZYRTEC) 10 MG tablet TAKE 1 TABLET BY MOUTH EVERY DAY 10/19/21   Dorothyann Peng, MD  Cholecalciferol (VITAMIN D) 125 MCG (5000 UT) CAPS Take 5,000 Units by mouth daily.    [provider]  COMBIGAN 0.2-0.5 % ophthalmic solution Place 1 drop into both eyes 2 (two) times daily. 03/15/19   [provider]  DULoxetine (CYMBALTA) 60 MG capsule TAKE 1 CAPSULE BY MOUTH EVERY DAY 05/25/23   Dorothyann Peng, MD  FARXIGA 10 MG TABS tablet TAKE 1 TABLET BY MOUTH DAILY BEFORE BREAKFAST. 05/25/23   Dorothyann Peng, MD  gabapentin (NEURONTIN) 300 MG capsule TAKE 3 CAPSULES BY MOUTH 3 TIMES DAILY. 05/23/23   Dorothyann Peng, MD  glucose blood (CONTOUR NEXT TEST) test strip Use daily to check blood sugars. 10/19/22   Dorothyann Peng, MD  HYDROcodone-acetaminophen (NORCO/VICODIN) 5-325 MG tablet Take 1 tablet by mouth every 6 (six) hours as needed for severe pain. Patient not taking: Reported on 07/19/2023 11/09/22   Pollyann Savoy, MD  latanoprost (XALATAN) 0.005 % ophthalmic solution Place 1 drop into both eyes at bedtime. 04/01/19   [provider]  levothyroxine (SYNTHROID) 50 MCG tablet One tab po daily M-Saturday 02/09/23   Dorothyann Peng, MD  levothyroxine (SYNTHROID) 75 MCG tablet TAKE 1 TABLET (75 MCG TOTAL) BY MOUTH ONCE A WEEK. SUNDAY ONLY 04/18/23   Dorothyann Peng, MD  losartan-hydrochlorothiazide [facility]) 50-12.5 MG tablet TAKE 1 TABLET BY MOUTH  EVERY DAY 07/18/23   Dorothyann Peng, MD  metFORMIN (GLUCOPHAGE-XR) 750 MG 24 hr tablet TAKE 1 TABLET BY MOUTH EVERY DAY 12/08/22   Dorothyann Peng, MD  pravastatin (PRAVACHOL) 80 MG tablet One tab po qd, skip Sundays 06/02/23   Dorothyann Peng, MD    Family History Family History  Problem Relation Age of Onset   Hypertension Mother    Kidney failure Mother    Congestive Heart Failure Mother    Heart Problems Father    Stroke Father    Pneumonia Father    Heart Problems Sister 81       heart transplant   Stroke Paternal Grandmother    Dementia Paternal Grandmother    Stroke Paternal Grandfather    Cardiomyopathy Sister    Ovarian cancer Sister 60       d. 41   Thyroid disease Sister    Thyroid disease Sister    Thyroid cancer Niece 45   Breast  cancer Neg Hx     Social History Social History   Tobacco Use   Smoking status: Never   Smokeless tobacco: Never  Vaping Use   Vaping status: Never Used  Substance Use Topics   Alcohol use: No   Drug use: No     Allergies   Cyclobenzaprine   Review of Systems Review of Systems  Constitutional:  Negative for chills, fatigue and fever.  Respiratory:  Positive for cough. Negative for shortness of breath and wheezing.   Gastrointestinal:  Negative for diarrhea, nausea and vomiting.  Genitourinary:  Negative for difficulty urinating, dysuria, flank pain and frequency.  Musculoskeletal:        Right side pain     Physical Exam Triage Vital Signs ED Triage Vitals  Encounter Vitals Group     BP 08/10/23 1633 126/77     Systolic BP Percentile --      Diastolic BP Percentile --      Pulse Rate 08/10/23 1633 96     Resp 08/10/23 1633 16     Temp 08/10/23 1633 99.2 F (37.3 C)     Temp Source 08/10/23 1633 Oral     SpO2 08/10/23 1633 95 %     Weight --      Height --      Head Circumference --      Peak Flow --      Pain Score 08/10/23 1632 6     Pain Loc --      Pain Education --      Exclude from Growth Chart --     No data found.  Updated Vital Signs BP 126/77 (BP Location: Right Arm)   Pulse 96   Temp 99.2 F (37.3 C) (Oral)   Resp 16   SpO2 95%   Visual Acuity Right Eye Distance:   Left Eye Distance:   Bilateral Distance:    Right Eye Near:   Left Eye Near:    Bilateral Near:     Physical Exam Vitals reviewed.  Constitutional:      General: She is awake. She is not in acute distress.    Appearance: Normal appearance. She is well-developed and well-groomed. She is not ill-appearing, toxic-appearing or diaphoretic.  HENT:     Head: Normocephalic and atraumatic.  Eyes:     General: Lids are normal. Gaze aligned appropriately.  Cardiovascular:     Rate and Rhythm: Normal rate and regular rhythm.     Heart sounds: Normal heart sounds.  Pulmonary:     Effort: Pulmonary effort is normal.     Breath sounds: Normal breath sounds. No decreased air movement. No decreased breath sounds, wheezing, rhonchi or rales.  Abdominal:     General: Abdomen is flat. Bowel sounds are normal. There is no distension.     Palpations: Abdomen is soft.     Tenderness: There is right CVA tenderness. Negative signs include Murphy's sign and McBurney's sign.  Musculoskeletal:     Cervical back: Normal range of motion and neck supple.  Neurological:     Mental Status: She is alert.  Psychiatric:        Attention and Perception: Attention normal.        Mood and Affect: Mood normal.        Speech: Speech normal.        Behavior: Behavior normal. Behavior is cooperative.      UC Treatments / Results  Labs (all labs ordered are listed, but only abnormal  results are displayed) Labs Reviewed  POCT URINALYSIS DIP (MANUAL ENTRY) - Abnormal; Notable for the following components:      Result Value   Glucose, UA >=1,000 (*)    All other components within normal limits    EKG   Radiology   Procedures Procedures (including critical care time)  Medications Ordered in UC Medications - No data to  display  Initial Impression / Assessment and Plan / UC Course  I have reviewed the triage vital signs and the nursing notes.  Pertinent labs & imaging results that were available during my care of the patient were reviewed by me and considered in my medical decision making (see chart for details).      Final Clinical Impressions(s) / UC Diagnoses   Final diagnoses:  Right flank pain  Costochondritis   Acute, new concern Patient presents today with complaints of right-sided flank and side pain.  Physical exam is reassuring for normal lung sounds, abdominal exam was also normal.  She did have some right CVA tenderness.  Urine testing was normal with the exception of notable glucose.  I am less suspicious for nephrolithiasis, pneumonia, cholelithiasis/cholecystitis at this time especially given vitals and overall benign exam.  Suspect costochondritis at the time especially since her pain worsens with coughing and certain movements.  Recommend Tylenol as needed for pain management.  Patient has diabetes so I do not recommend to doing a steroid at this time due to concerns for hyperglycemia.  ED and return precautions reviewed and provided in after visit summary.  Follow-up as needed.    Discharge Instructions      Your urine results were negative for blood or white blood cells.  This means it is less likely that you are having or passing a kidney stone or have a UTI.  At this time I suspect that you likely have a pulled muscle in the area.  You can take Tylenol as needed for pain management.  I have sent in some stretches for you to do.  These are included in your after visit summary.  You can also use warm compresses and gentle massage as tolerated to help with pain management.  If your symptoms persist or start to become worse I recommend following up with your PCP or you can return here or go to the emergency room per your preference.     ED Prescriptions   None    PDMP not reviewed  this encounter.   Providence Crosby, PA-C 08/10/23 1801

## 2023-08-10 NOTE — ED Triage Notes (Addendum)
Patient c/o bilateral flank pain x 4 days R>L. Patient states pain is worse when she bends or coughs. Patient denies any urinary issues, N/v/D.  Patient has not had any medication for her symptom.

## 2023-08-18 ENCOUNTER — Other Ambulatory Visit: Payer: Self-pay | Admitting: Internal Medicine

## 2023-08-25 DIAGNOSIS — H02403 Unspecified ptosis of bilateral eyelids: Secondary | ICD-10-CM | POA: Diagnosis not present

## 2023-08-25 DIAGNOSIS — E119 Type 2 diabetes mellitus without complications: Secondary | ICD-10-CM | POA: Diagnosis not present

## 2023-08-25 DIAGNOSIS — H401133 Primary open-angle glaucoma, bilateral, severe stage: Secondary | ICD-10-CM | POA: Diagnosis not present

## 2023-09-12 ENCOUNTER — Other Ambulatory Visit: Payer: Self-pay | Admitting: Internal Medicine

## 2023-09-12 DIAGNOSIS — E1169 Type 2 diabetes mellitus with other specified complication: Secondary | ICD-10-CM

## 2023-10-05 DIAGNOSIS — H401133 Primary open-angle glaucoma, bilateral, severe stage: Secondary | ICD-10-CM | POA: Diagnosis not present

## 2023-10-05 DIAGNOSIS — G500 Trigeminal neuralgia: Secondary | ICD-10-CM | POA: Diagnosis not present

## 2023-10-05 DIAGNOSIS — E119 Type 2 diabetes mellitus without complications: Secondary | ICD-10-CM | POA: Diagnosis not present

## 2023-10-05 DIAGNOSIS — H02403 Unspecified ptosis of bilateral eyelids: Secondary | ICD-10-CM | POA: Diagnosis not present

## 2023-10-17 ENCOUNTER — Encounter: Payer: Self-pay | Admitting: Internal Medicine

## 2023-10-17 ENCOUNTER — Ambulatory Visit: Payer: Medicare PPO | Admitting: Internal Medicine

## 2023-10-17 VITALS — BP 118/78 | HR 90 | Temp 98.5°F | Ht 65.0 in | Wt 164.2 lb

## 2023-10-17 DIAGNOSIS — E039 Hypothyroidism, unspecified: Secondary | ICD-10-CM | POA: Diagnosis not present

## 2023-10-17 DIAGNOSIS — E785 Hyperlipidemia, unspecified: Secondary | ICD-10-CM

## 2023-10-17 DIAGNOSIS — I119 Hypertensive heart disease without heart failure: Secondary | ICD-10-CM

## 2023-10-17 DIAGNOSIS — I700 Atherosclerosis of aorta: Secondary | ICD-10-CM

## 2023-10-17 DIAGNOSIS — G500 Trigeminal neuralgia: Secondary | ICD-10-CM | POA: Diagnosis not present

## 2023-10-17 DIAGNOSIS — E1169 Type 2 diabetes mellitus with other specified complication: Secondary | ICD-10-CM | POA: Diagnosis not present

## 2023-10-17 MED ORDER — PRAVASTATIN SODIUM 80 MG PO TABS: ORAL_TABLET | ORAL | 2 refills | Status: DC

## 2023-10-17 MED ORDER — LEVOTHYROXINE SODIUM 50 MCG PO TABS: ORAL_TABLET | ORAL | 2 refills | Status: DC

## 2023-10-17 MED ORDER — CONTOUR NEXT TEST VI STRP: ORAL_STRIP | 2 refills | Status: AC

## 2023-10-17 MED ORDER — DULOXETINE HCL 60 MG PO CPEP: ORAL_CAPSULE | ORAL | 1 refills | Status: DC

## 2023-10-17 MED ORDER — CETIRIZINE HCL 10 MG PO TABS: 10.0000 mg | ORAL_TABLET | Freq: Every day | ORAL | 0 refills | Status: DC

## 2023-10-17 NOTE — Patient Instructions (Addendum)
 Trigeminal Neuralgia  Trigeminal neuralgia is a nerve disorder that causes severe pain on one side of the face. The pain may last from a few seconds to several minutes, but it can happen hundreds of times a day. The pain is usually only on one side of the face. Symptoms may occur for days, weeks, or months and then go away for months or years. The pain may return and be worse than before. What are the causes? This condition may be caused by: Damage or pressure to a nerve in the head that is called the trigeminal nerve. An attack can be triggered by: Talking or chewing. Putting on makeup. Washing, shaving, or touching your face. Brushing your teeth. Blasts of hot or cold air. Primary demyelinating disorders, such as multiple sclerosis. Tumors. What increases the risk? You are more likely to develop this condition if: You are 31-10 years old. You are female. What are the signs or symptoms? The main symptom of this condition is severe pain in the jaw, lips, eyes, nose, scalp, forehead, and face. How is this diagnosed? This condition is diagnosed with a physical exam. A CT scan or an MRI may be done to rule out other conditions that can cause facial pain. How is this treated? This condition may be treated with: Measures to avoid the things that trigger your symptoms. Prescription medicines such as anticonvulsants. Procedures such as ablation, thermal, or radiation therapy. Cognitive or behavioral therapy. Complementary therapies such as: Gentle, regular exercise or yoga. Meditation. Aromatherapy. Acupuncture. Surgery. This may be done in severe cases if other medical treatment does not provide relief. It may take up to one month for treatment to start relieving the pain. Follow these instructions at home: Managing pain  Learn as much as you can about how to manage your pain. Ask your health care provider if a pain specialist would be helpful. Consider talking with a mental health  care provider about how to cope with the pain. Consider joining a pain support group. General instructions Take over-the-counter and prescription medicines only as told by your health care provider. Avoid the things that trigger your symptoms. It may help to: Chew on the unaffected side of your mouth. Avoid touching your face. Avoid blasts of hot or cold air. Keep all follow-up visits. Where to find more information Facial Pain Association: facepain.org Contact a health care provider if: Your medicine is not helping your symptoms. You have side effects from the medicine used for treatment. You develop new, unexplained symptoms, such as: Double vision. Facial weakness or numbness. Changes in hearing or balance. You feel depressed. Get help right away if: Your pain is severe and is not getting better. You develop suicidal thoughts. If you ever feel like you may hurt yourself or others, or have thoughts about taking your own life, get help right away. Go to your nearest emergency department or: Call your local emergency services (911 in the [place] Call a suicide crisis helpline, such as the National Suicide Prevention Lifeline at 857-268-9197 or 988 in the [place] This is open 24 hours a day in the [place] If you're a Veteran: Call 988 and press 1. This is open 24 hours a day. Text the [employer] at 587-680-7818. Summary Trigeminal neuralgia is a nerve disorder that causes severe pain on one side of the face. The pain may last from a few seconds to several minutes. This condition is caused by damage or pressure to a nerve in the head that is called  the trigeminal nerve. Treatment may include avoiding the things that trigger your symptoms, taking medicines, or having procedures or surgery. It may take up to one month for treatment to start relieving the pain. Keep all follow-up visits. This information is not intended to replace advice given to you by your health care provider. Make sure  you discuss any questions you have with your health care provider. Document Revised: 01/27/2023 Document Reviewed: 12/08/2020 Elsevier Patient Education  2024 [employer].

## 2023-10-17 NOTE — Progress Notes (Signed)
 I,Christina Hernandez, CMA,acting as a Neurosurgeon for Christina Dung, MD.,have documented all relevant documentation on the behalf of Christina Dung, MD,as directed by  Christina Dung, MD while in the presence of Christina Dung, MD.  Subjective:  Patient ID: Christina Hernandez , female    DOB: [birth year] , 73 y.o.   MRN: [id number]  Chief Complaint  Patient presents with   Diabetes    Patient presents today for bp, dm & thyroid  follow up. She reports compliance with medications. Denies headache, chest pain & sob.   Hypertension   Hypothyroidism    HPI Discussed the use of AI scribe software for clinical note transcription with the patient, who gave verbal consent to proceed.  History of Present Illness Christina Hernandez is a 73 year old female with diabetes and hypertension who presents for a routine check-up.  She has not been checking her blood glucose levels regularly due to a lack of supplies, including a meter, lancets, and test strips, and has requested these items from the nurse. Her current medications include anastrozole , baclofen  three times a day for facial spasms, duloxetine  once a day for facial pain, gabapentin  three times a day, Synthroid  50 mcg daily, losartan  50/12.5 mg, metformin  once a day, and pravastatin  daily. She feels extra sleepy and tired, which may be related to her medication regimen.  She is planning to move to [place] on May 15th to live with her daughter. Since her husband's passing, she has been neglecting housework and cooking but plans to help her daughter by cooking and ironing clothes.  She has a history of trigeminal neuralgia and has previously received Botox treatment, which resulted in facial asymmetry without relief of symptoms. She is considering seeing a specialist for further management.  No current use of Zyrtec .   Diabetes She presents for her follow-up diabetic visit. She has type 2 diabetes mellitus. There are no hypoglycemic  associated symptoms. There are no diabetic associated symptoms. Pertinent negatives for diabetes include no blurred vision and no chest pain. There are no hypoglycemic complications. Risk factors for coronary artery disease include diabetes mellitus, dyslipidemia, hypertension, sedentary lifestyle and post-menopausal. She participates in exercise intermittently. An ACE inhibitor/angiotensin II receptor blocker is being taken. Eye exam is current.  Hypertension This is a chronic problem. The current episode started more than 1 year ago. The problem has been gradually improving since onset. The problem is controlled. Pertinent negatives include no blurred vision, chest pain, palpitations or shortness of breath. Risk factors for coronary artery disease include diabetes mellitus, dyslipidemia, post-menopausal state and sedentary lifestyle. The current treatment provides moderate improvement. Compliance problems include exercise.      Past Medical History:  Diagnosis Date   Arthritis    Breast cancer, right breast (HCC)    pending right breast lumpectomy on September 01, 2018   Diabetes mellitus type II, controlled (HCC)    On Kombiglyze   Family history of ovarian cancer    Family history of thyroid  cancer    GERD (gastroesophageal reflux disease)    rarely occurs- no meds   Hyperlipidemia associated with type 2 diabetes mellitus (HCC)    Hypertension    Hypothyroidism    Personal history of radiation therapy    Primary localized osteoarthritis of right knee 04/17/2019   S/P knee replacement 04/17/2019   Trigeminal neuralgia of left side of face 2015   s/p Gamma knife     Family History  Problem Relation Age of Onset  Hypertension Mother    Kidney failure Mother    Congestive Heart Failure Mother    Heart Problems Father    Stroke Father    Pneumonia Father    Heart Problems Sister 28       heart transplant   Stroke Paternal Grandmother    Dementia Paternal Grandmother    Stroke  Paternal Grandfather    Cardiomyopathy Sister    Ovarian cancer Sister 60       d. 85   Thyroid  disease Sister    Thyroid  disease Sister    Thyroid  cancer Niece 68   Breast cancer Neg Hx      Current Outpatient Medications:    anastrozole  (ARIMIDEX ) 1 MG tablet, TAKE 1 TABLET BY MOUTH EVERY DAY, Disp: 90 tablet, Rfl: 4   baclofen  (LIORESAL ) 10 MG tablet, TAKE 1 TABLET BY MOUTH THREE TIMES A DAY AS NEEDED FOR MUSCLE SPASM, Disp: 270 tablet, Rfl: 0   Cholecalciferol  (VITAMIN D ) 125 MCG (5000 UT) CAPS, Take 5,000 Units by mouth daily., Disp: , Rfl:    COMBIGAN  0.2-0.5 % ophthalmic solution, Place 1 drop into both eyes 2 (two) times daily., Disp: , Rfl:    FARXIGA  10 MG TABS tablet, TAKE 1 TABLET BY MOUTH DAILY BEFORE BREAKFAST., Disp: 90 tablet, Rfl: 1   gabapentin  (NEURONTIN ) 300 MG capsule, TAKE 3 CAPSULES BY MOUTH 3 TIMES DAILY., Disp: 270 capsule, Rfl: 6   latanoprost  (XALATAN ) 0.005 % ophthalmic solution, Place 1 drop into both eyes at bedtime., Disp: , Rfl:    losartan -hydrochlorothiazide  (HYZAAR) 50-12.5 MG tablet, TAKE 1 TABLET BY MOUTH EVERY DAY, Disp: 90 tablet, Rfl: 1   metFORMIN  (GLUCOPHAGE -XR) 750 MG 24 hr tablet, TAKE 1 TABLET BY MOUTH EVERY DAY, Disp: 90 tablet, Rfl: 1   cetirizine  (ZYRTEC ) 10 MG tablet, Take 1 tablet (10 mg total) by mouth daily., Disp: 90 tablet, Rfl: 0   DULoxetine  (CYMBALTA ) 60 MG capsule, TAKE 1 CAPSULE BY MOUTH EVERY DAY, Disp: 90 capsule, Rfl: 1   glucose blood (CONTOUR NEXT TEST) test strip, Use daily to check blood sugars., Disp: 100 each, Rfl: 2   levothyroxine  (SYNTHROID ) 50 MCG tablet, One tab po daily, Disp: 90 tablet, Rfl: 2   pravastatin  (PRAVACHOL ) 80 MG tablet, ONE TAB DAILY po, Disp: 78 tablet, Rfl: 2   Allergies  Allergen Reactions   Cyclobenzaprine  Nausea Only     Review of Systems  Constitutional: Negative.   Eyes:  Negative for blurred vision.  Respiratory: Negative.  Negative for shortness of breath.   Cardiovascular: Negative.   Negative for chest pain and palpitations.  Neurological: Negative.   Psychiatric/Behavioral: Negative.       Today's Vitals   10/17/23 1413  BP: 118/78  Pulse: 90  Temp: 98.5 F (36.9 C)  SpO2: 98%  Weight: 164 lb 3.2 oz (74.5 kg)  Height: 5\' 5"  (1.651 m)   Body mass index is 27.32 kg/m.  Wt Readings from Last 3 Encounters:  10/17/23 164 lb 3.2 oz (74.5 kg)  07/19/23 165 lb (74.8 kg)  07/05/23 163 lb (73.9 kg)     Objective:  Physical Exam Vitals and nursing note reviewed.  Constitutional:      Appearance: Normal appearance.  HENT:     Head: Normocephalic and atraumatic.  Eyes:     Extraocular Movements: Extraocular movements intact.  Cardiovascular:     Rate and Rhythm: Normal rate and regular rhythm.     Heart sounds: Normal heart sounds.  Pulmonary:  Effort: Pulmonary effort is normal.     Breath sounds: Normal breath sounds.  Musculoskeletal:     Cervical back: Normal range of motion.  Skin:    General: Skin is warm.  Neurological:     General: No focal deficit present.     Mental Status: She is alert.  Psychiatric:        Mood and Affect: Mood normal.        Behavior: Behavior normal.      Assessment And Plan:  Type 2 diabetes mellitus with hyperlipidemia (HCC) Assessment & Plan: Chronic, LDL goal is less than 70.   She will c/w pravastatin  80mg  daily.  She will continue with metformin  XR 750mg  and Farxiga  10mg  daily for her diabetes. She is encouraged to avoid refined carbs. She will f/u in 3-4 months for re-evaluation. Blood glucose monitoring not performed due to lack of supplies. Monitoring needed for glycemic control. - Order lancets and test strips for blood glucose monitoring. - Schedule follow-up diabetes check in August. - She was also given Dexcom G7 sensors  Orders: -     CMP14+EGFR -     Hemoglobin A1c -     Pravastatin  Sodium; ONE TAB DAILY po  Dispense: 78 tablet; Refill: 2  Hypertensive heart disease without heart  failure Assessment & Plan: Blood pressure well-controlled on current regimen. - Continue losartan /hctz 50/12.5mg  daily.   Atherosclerosis of aorta (HCC) Assessment & Plan: Chronic, LDL goal< 70.  She will c/w pravastatin  and encouraged to follow heart healthy lifestyle.    Primary hypothyroidism Assessment & Plan: Currently on Synthroid  50 mcg daily. Reports generic may be less effective. Thyroid  function tests to assess control. - Order thyroid  function tests. - Consider switching to brand Synthroid  based on test results.  Orders: -     TSH + free T4  Trigeminal neuralgia of left side of face Assessment & Plan: Facial pain and spasms managed with medications causing drowsiness. Baclofen  adjustment planned to reduce drowsiness risk. - Refer to a trigeminal neuralgia specialist in [place]. - Adjust baclofen  to as-needed basis NOT TID - Continue gabapentin  as prescribed.  Orders: -     Ambulatory referral to Neurology  Other orders -     Cetirizine  HCl; Take 1 tablet (10 mg total) by mouth daily.  Dispense: 90 tablet; Refill: 0 -     DULoxetine  HCl; TAKE 1 CAPSULE BY MOUTH EVERY DAY  Dispense: 90 capsule; Refill: 1 -     Levothyroxine  Sodium; One tab po daily  Dispense: 90 tablet; Refill: 2 -     Contour Next Test; Use daily to check blood sugars.  Dispense: 100 each; Refill: 2      Return for 4 month dm.  Patient was given opportunity to ask questions. Patient verbalized understanding of the plan and was able to repeat key elements of the plan. All questions were answered to their satisfaction.    I, Christina Dung, MD, have reviewed all documentation for this visit. The documentation on 10/17/23 for the exam, diagnosis, procedures, and orders are all accurate and complete.   IF YOU HAVE BEEN REFERRED TO A SPECIALIST, IT MAY TAKE 1-2 WEEKS TO SCHEDULE/PROCESS THE REFERRAL. IF YOU HAVE NOT HEARD FROM US /SPECIALIST IN TWO WEEKS, PLEASE GIVE US  A CALL AT [phone] X  [phone].   THE PATIENT IS ENCOURAGED TO PRACTICE SOCIAL DISTANCING DUE TO THE COVID-19 PANDEMIC.

## 2023-10-17 NOTE — Assessment & Plan Note (Signed)
 Currently on Synthroid  50 mcg daily. Reports generic may be less effective. Thyroid  function tests to assess control. - Order thyroid  function tests. - Consider switching to brand Synthroid  based on test results.

## 2023-10-17 NOTE — Assessment & Plan Note (Signed)
 Chronic, LDL goal is less than 70.   She will c/w pravastatin  80mg  daily.  She will continue with metformin  XR 750mg  and Farxiga  10mg  daily for her diabetes. She is encouraged to avoid refined carbs. She will f/u in 3-4 months for re-evaluation. Blood glucose monitoring not performed due to lack of supplies. Monitoring needed for glycemic control. - Order lancets and test strips for blood glucose monitoring. - Schedule follow-up diabetes check in August. - She was also given Dexcom G7 sensors

## 2023-10-17 NOTE — Assessment & Plan Note (Signed)
Chronic, LDL goal< 70.  She will c/w pravastatin and encouraged to follow heart healthy lifestyle.

## 2023-10-17 NOTE — Assessment & Plan Note (Signed)
 Blood pressure well-controlled on current regimen. - Continue losartan /hctz 50/12.5mg  daily.

## 2023-10-17 NOTE — Assessment & Plan Note (Signed)
 Facial pain and spasms managed with medications causing drowsiness. Baclofen  adjustment planned to reduce drowsiness risk. - Refer to a trigeminal neuralgia specialist in [place]. - Adjust baclofen  to as-needed basis NOT TID - Continue gabapentin  as prescribed.

## 2023-10-18 ENCOUNTER — Ambulatory Visit: Payer: Self-pay | Admitting: Internal Medicine

## 2023-10-18 LAB — CMP14+EGFR
ALT: 13 IU/L (ref 0–32)
AST: 18 IU/L (ref 0–40)
Albumin: 4.5 g/dL (ref 3.8–4.8)
Alkaline Phosphatase: 107 IU/L (ref 44–121)
BUN/Creatinine Ratio: 23 (ref 12–28)
BUN: 16 mg/dL (ref 8–27)
Bilirubin Total: 0.4 mg/dL (ref 0.0–1.2)
CO2: 23 mmol/L (ref 20–29)
Calcium: 9.9 mg/dL (ref 8.7–10.3)
Chloride: 105 mmol/L (ref 96–106)
Creatinine, Ser: 0.71 mg/dL (ref 0.57–1.00)
Globulin, Total: 2.7 g/dL (ref 1.5–4.5)
Glucose: 83 mg/dL (ref 70–99)
Potassium: 4.2 mmol/L (ref 3.5–5.2)
Sodium: 145 mmol/L — ABNORMAL HIGH (ref 134–144)
Total Protein: 7.2 g/dL (ref 6.0–8.5)
eGFR: 89 mL/min/{1.73_m2} (ref >59)

## 2023-10-18 LAB — TSH+FREE T4
Free T4: 1.05 ng/dL (ref 0.82–1.77)
TSH: 1.14 u[IU]/mL (ref 0.450–4.500)

## 2023-10-18 LAB — HEMOGLOBIN A1C
Est. average glucose Bld gHb Est-mCnc: 137 mg/dL
Hgb A1c MFr Bld: 6.4 % — ABNORMAL HIGH (ref 4.8–5.6)

## 2023-10-18 NOTE — Telephone Encounter (Signed)
   Disposition: [x]  Follow-up with PCP  Additional Notes: Pt states her blood sugar was 69 when she woke up and 46 a few minutes ago. As this RN was getting more information, pt states she had an incoming call from Dr. Elnita Hai, her PCP. This RN told pt to call back if she did not get everything resolved.   Copied from CRM [id number]. Topic: Clinical - Red Word Triage >> Oct 18, 2023 10:14 AM Everette C wrote: [facility] that prompted transfer to Nurse Triage: The patient has experienced their blood sugar dropping rapidly to as low as 46 when checked roughly 5 minutes ago, the patient is experiencing a headache and shares that they have not eaten this morning Reason for Disposition  [1] Caller requesting NON-URGENT health information AND [2] PCP's office is the best resource  Caller has already spoken with the PCP and has no further questions.  Protocols used: Information Only Call - No Triage-A-AH, No Contact or Duplicate Contact Call-A-AH

## 2023-10-18 NOTE — Telephone Encounter (Signed)
  Chief Complaint: low glucose Symptoms: headache Frequency: x this morning Pertinent Negatives: Patient denies symptoms Disposition: [] ED /[] Urgent Care (no appt availability in office) / [x] Appointment(In office/virtual)/ []  [facility]/ [x] Home Care/ [] Refused Recommended Disposition /[] [facility]/ [x]  Follow-up with PCP Additional Notes: states that she woke up this morning her glucose was 46.  States that she drank a protein shake and is now eating a bowl of cereal and not her glucose is back up to 77 and is now 86.  States that the headache is gone away now. Denies any hypoglycemic symptoms now. Scheduled for appt tomorrow and given home advise.   Reason for Disposition  [1] Blood glucose 70  mg/dL (3.9 mmol/L) or below OR symptomatic, now improved with Care Advice AND [2] cause unknown  Answer Assessment - Initial Assessment Questions 1. SYMPTOMS: "What symptoms are you concerned about?"     headache 2. ONSET:  "When did the symptoms start?"     This morning 3. BLOOD GLUCOSE: "What is your blood glucose level?"      46 4. USUAL RANGE: "What is your blood glucose level usually?" (e.g., usual fasting morning value, usual evening value)     unknown 5. TYPE 1 or 2:  "Do you know what type of diabetes you have?"  (e.g., Type 1, Type 2, Gestational; doesn't know)      Type 2 6. INSULIN : "Do you take insulin ?" "What type of insulin (s) do you use? What is the mode of delivery? (syringe, pen; injection or pump) "When did you last give yourself an insulin  dose?" (i.e., time or hours/minutes ago) "How much did you give?" (i.e., how many units)     no 7. DIABETES PILLS: "Do you take any pills for your diabetes?" If Yes, ask: "What is the name of the medicine(s) that you take for high blood sugar?"     Farxiga  and metformin  8. OTHER SYMPTOMS: "Do you have any symptoms?" (e.g., fever, frequent urination, difficulty breathing, vomiting)     headache 9. LOW BLOOD GLUCOSE  TREATMENT: "What have you done so far to treat the low blood glucose level?"     Eating now and drank a protein shake 10. FOOD: "When did you last eat or drink?"       Eating not  Protocols used: Diabetes - Low Blood Sugar-A-AH

## 2023-10-19 ENCOUNTER — Ambulatory Visit: Payer: Self-pay | Admitting: Internal Medicine

## 2023-10-30 ENCOUNTER — Other Ambulatory Visit: Payer: Self-pay | Admitting: Internal Medicine

## 2023-12-10 ENCOUNTER — Other Ambulatory Visit: Payer: Self-pay | Admitting: Internal Medicine

## 2023-12-14 ENCOUNTER — Other Ambulatory Visit: Payer: Self-pay

## 2023-12-29 ENCOUNTER — Telehealth: Payer: Self-pay | Admitting: Internal Medicine

## 2023-12-29 NOTE — Telephone Encounter (Unsigned)
 Copied from CRM [id number]. Topic: Clinical - Medication Refill >> Dec 29, 2023  4:25 PM Kevelyn M wrote: Medication: glucose blood (CONTOUR NEXT TEST) test strip, Blood Glucose Monitoring Suppl (ACCU-CHEK GUIDE) w/Device KIT, she prefers the [facility]  Has the patient contacted their pharmacy? No (Agent: If no, request that the patient contact the pharmacy for the refill. If patient does not wish to contact the pharmacy document the reason why and proceed with request.) (Agent: If yes, when and what did the pharmacy advise?)  This is the patient's preferred pharmacy:  [facility] [id number] [place], [place] [address] [phone]  Is this the correct pharmacy for this prescription? Yes If no, delete pharmacy and type the correct one.   Has the prescription been filled recently? No  Is the patient out of the medication? Yes  Has the patient been seen for an appointment in the last year OR does the patient have an upcoming appointment? Yes  Can we respond through MyChart? Yes  Agent: Please be advised that Rx refills may take up to 3 business days. We ask that you follow-up with your pharmacy.

## 2024-01-02 DIAGNOSIS — G501 Atypical facial pain: Secondary | ICD-10-CM | POA: Diagnosis not present

## 2024-01-02 DIAGNOSIS — E785 Hyperlipidemia, unspecified: Secondary | ICD-10-CM | POA: Diagnosis not present

## 2024-01-02 DIAGNOSIS — C50011 Malignant neoplasm of nipple and areola, right female breast: Secondary | ICD-10-CM | POA: Diagnosis not present

## 2024-01-02 DIAGNOSIS — E1169 Type 2 diabetes mellitus with other specified complication: Secondary | ICD-10-CM | POA: Diagnosis not present

## 2024-01-02 DIAGNOSIS — G509 Disorder of trigeminal nerve, unspecified: Secondary | ICD-10-CM | POA: Diagnosis not present

## 2024-01-02 DIAGNOSIS — Z1330 Encounter for screening examination for mental health and behavioral disorders, unspecified: Secondary | ICD-10-CM | POA: Diagnosis not present

## 2024-01-12 DIAGNOSIS — G509 Disorder of trigeminal nerve, unspecified: Secondary | ICD-10-CM | POA: Diagnosis not present

## 2024-01-12 DIAGNOSIS — G501 Atypical facial pain: Secondary | ICD-10-CM | POA: Diagnosis not present

## 2024-01-12 DIAGNOSIS — R519 Headache, unspecified: Secondary | ICD-10-CM | POA: Diagnosis not present

## 2024-01-13 DIAGNOSIS — Z1231 Encounter for screening mammogram for malignant neoplasm of breast: Secondary | ICD-10-CM | POA: Diagnosis not present

## 2024-01-13 DIAGNOSIS — R92333 Mammographic heterogeneous density, bilateral breasts: Secondary | ICD-10-CM | POA: Diagnosis not present

## 2024-01-13 LAB — HM MAMMOGRAPHY

## 2024-01-17 DIAGNOSIS — R200 Anesthesia of skin: Secondary | ICD-10-CM | POA: Diagnosis not present

## 2024-01-17 DIAGNOSIS — R519 Headache, unspecified: Secondary | ICD-10-CM | POA: Diagnosis not present

## 2024-01-18 ENCOUNTER — Other Ambulatory Visit: Payer: Self-pay | Admitting: Internal Medicine

## 2024-02-16 DIAGNOSIS — G500 Trigeminal neuralgia: Secondary | ICD-10-CM | POA: Diagnosis not present

## 2024-02-16 DIAGNOSIS — H02403 Unspecified ptosis of bilateral eyelids: Secondary | ICD-10-CM | POA: Diagnosis not present

## 2024-02-16 DIAGNOSIS — E119 Type 2 diabetes mellitus without complications: Secondary | ICD-10-CM | POA: Diagnosis not present

## 2024-02-16 DIAGNOSIS — H2511 Age-related nuclear cataract, right eye: Secondary | ICD-10-CM | POA: Diagnosis not present

## 2024-02-16 DIAGNOSIS — H401133 Primary open-angle glaucoma, bilateral, severe stage: Secondary | ICD-10-CM | POA: Diagnosis not present

## 2024-02-17 ENCOUNTER — Other Ambulatory Visit: Payer: Self-pay | Admitting: Internal Medicine

## 2024-02-20 ENCOUNTER — Ambulatory Visit: Admitting: Internal Medicine

## 2024-02-20 ENCOUNTER — Encounter: Payer: Self-pay | Admitting: Internal Medicine

## 2024-02-20 VITALS — BP 120/68 | HR 89 | Temp 98.5°F | Ht 65.0 in | Wt 174.0 lb

## 2024-02-20 DIAGNOSIS — I119 Hypertensive heart disease without heart failure: Secondary | ICD-10-CM | POA: Diagnosis not present

## 2024-02-20 DIAGNOSIS — E785 Hyperlipidemia, unspecified: Secondary | ICD-10-CM

## 2024-02-20 DIAGNOSIS — I700 Atherosclerosis of aorta: Secondary | ICD-10-CM | POA: Diagnosis not present

## 2024-02-20 DIAGNOSIS — E1169 Type 2 diabetes mellitus with other specified complication: Secondary | ICD-10-CM

## 2024-02-20 DIAGNOSIS — C50011 Malignant neoplasm of nipple and areola, right female breast: Secondary | ICD-10-CM

## 2024-02-20 DIAGNOSIS — E039 Hypothyroidism, unspecified: Secondary | ICD-10-CM | POA: Diagnosis not present

## 2024-02-20 DIAGNOSIS — K760 Fatty (change of) liver, not elsewhere classified: Secondary | ICD-10-CM

## 2024-02-20 NOTE — Assessment & Plan Note (Signed)
 Essential hypertension, managed with losartan  and hydrochlorothiazide  50/12.5 mg. - Continue losartan  and hydrochlorothiazide  50/12.5 mg.

## 2024-02-20 NOTE — Assessment & Plan Note (Signed)
 Chronic, LDL goal is less than 70.   She will c/w pravastatin  80mg  daily.  She will continue with metformin  XR 750mg  and Farxiga  10mg  daily for her diabetes. She is encouraged to avoid refined carbs. She will f/u in 3-4 months for re-evaluation. - Order lancets and test strips for blood glucose monitoring. - Schedule follow-up diabetes check in January 2026 (physical exam)

## 2024-02-20 NOTE — Progress Notes (Signed)
 I,Jameka J Llittleton, CMA,acting as a Neurosurgeon for Catheryn LOISE Slocumb, MD.,have documented all relevant documentation on the behalf of Catheryn LOISE Slocumb, MD,as directed by  Catheryn LOISE Slocumb, MD while in the presence of Catheryn LOISE Slocumb, MD.  Subjective:  Patient ID: Christina Hernandez , female    DOB: [birth year] , 73 y.o.   MRN: [id number]  Chief Complaint  Patient presents with   Diabetes    Patient presents today for a DM and BP check. Patient reports compliance with medications. She denies having any headaches, chest pain and shortness of breath. She has no new concerns at this time.    Hypertension    HPI Discussed the use of AI scribe software for clinical note transcription with the patient, who gave verbal consent to proceed.  History of Present Illness Christina Hernandez is a 73 year old female who presents for a routine follow-up visit.  She reports taking losartan  and hydrochlorothiazide  50/12.5 mg for hypertension, metformin  for diabetes, and pravastatin  for hyperlipidemia. She takes levothyroxine  for hypothyroidism but sometimes skips doses due to uncertainty about the prescription instructions. She is on anastrozole  for breast cancer and cetirizine  for allergies. She takes duloxetine  60 mg once daily and gabapentin  as part of her medication regimen.  She recently underwent an MRI, which showed no damage. She has a history of a benign lung nodule and fatty liver. She exercises with a trainer once a week and is considering music lessons to aid cognitive function.  Her recent routine screenings include a mammogram in July, a bone density test in February, and a colonoscopy in 2018. She had an eye exam last week with Dr. Ruthellen and a recent dental visit.  Socially, she recently moved to [place] after selling her house and is currently staying with her son in [place]. She visited her sister in [place] recently.   Diabetes She presents for her follow-up diabetic  visit. She has type 2 diabetes mellitus. There are no hypoglycemic associated symptoms. There are no diabetic associated symptoms. Pertinent negatives for diabetes include no blurred vision, no chest pain, no polydipsia, no polyphagia and no polyuria. There are no hypoglycemic complications. Risk factors for coronary artery disease include diabetes mellitus, dyslipidemia, hypertension, sedentary lifestyle and post-menopausal. She participates in exercise intermittently. An ACE inhibitor/angiotensin II receptor blocker is being taken. Eye exam is current.  Hypertension This is a chronic problem. The current episode started more than 1 year ago. The problem has been gradually improving since onset. The problem is controlled. Pertinent negatives include no blurred vision, chest pain, palpitations or shortness of breath. Risk factors for coronary artery disease include diabetes mellitus, dyslipidemia, post-menopausal state and sedentary lifestyle. The current treatment provides moderate improvement. Compliance problems include exercise.      Past Medical History:  Diagnosis Date   Arthritis    Breast cancer, right breast (HCC)    pending right breast lumpectomy on September 01, 2018   Diabetes mellitus type II, controlled (HCC)    On Kombiglyze   Family history of ovarian cancer    Family history of thyroid  cancer    GERD (gastroesophageal reflux disease)    rarely occurs- no meds   Hyperlipidemia associated with type 2 diabetes mellitus (HCC)    Hypertension    Hypothyroidism    Personal history of radiation therapy    Primary localized osteoarthritis of right knee 04/17/2019   S/P knee replacement 04/17/2019   Trigeminal neuralgia of left side of face 2015  s/p Gamma knife     Family History  Problem Relation Age of Onset   Hypertension Mother    Kidney failure Mother    Congestive Heart Failure Mother    Heart Problems Father    Stroke Father    Pneumonia Father    Heart Problems Sister  64       heart transplant   Stroke Paternal Grandmother    Dementia Paternal Grandmother    Stroke Paternal Grandfather    Cardiomyopathy Sister    Ovarian cancer Sister 60       d. 1   Thyroid  disease Sister    Thyroid  disease Sister    Thyroid  cancer Niece 57   Breast cancer Neg Hx      Current Outpatient Medications:    anastrozole  (ARIMIDEX ) 1 MG tablet, TAKE 1 TABLET BY MOUTH EVERY DAY, Disp: 90 tablet, Rfl: 4   baclofen  (LIORESAL ) 10 MG tablet, TAKE 1 TABLET BY MOUTH THREE TIMES A DAY AS NEEDED FOR MUSCLE SPASM, Disp: 270 tablet, Rfl: 0   cetirizine  (ZYRTEC ) 10 MG tablet, TAKE 1 TABLET BY MOUTH EVERY DAY, Disp: 90 tablet, Rfl: 0   Cholecalciferol  (VITAMIN D ) 125 MCG (5000 UT) CAPS, Take 5,000 Units by mouth daily., Disp: , Rfl:    COMBIGAN  0.2-0.5 % ophthalmic solution, Place 1 drop into both eyes 2 (two) times daily., Disp: , Rfl:    DULoxetine  (CYMBALTA ) 60 MG capsule, Take 60 mg by mouth., Disp: , Rfl:    FARXIGA  10 MG TABS tablet, TAKE 1 TABLET BY MOUTH DAILY BEFORE BREAKFAST., Disp: 90 tablet, Rfl: 1   gabapentin  (NEURONTIN ) 300 MG capsule, Take 900 mg by mouth., Disp: , Rfl:    glucose blood (CONTOUR NEXT TEST) test strip, Use daily to check blood sugars., Disp: 100 each, Rfl: 2   latanoprost  (XALATAN ) 0.005 % ophthalmic solution, Place 1 drop into both eyes at bedtime., Disp: , Rfl:    levothyroxine  (SYNTHROID ) 50 MCG tablet, TAKE 1 TABLET BY MOUTH MONDAY THROUGH FRIDAY ONLY, Disp: 60 tablet, Rfl: 2   losartan -hydrochlorothiazide  (HYZAAR) 50-12.5 MG tablet, TAKE 1 TABLET BY MOUTH EVERY DAY, Disp: 90 tablet, Rfl: 1   metFORMIN  (GLUCOPHAGE -XR) 750 MG 24 hr tablet, TAKE 1 TABLET BY MOUTH EVERY DAY, Disp: 90 tablet, Rfl: 1   pravastatin  (PRAVACHOL ) 80 MG tablet, ONE TAB DAILY po, Disp: 78 tablet, Rfl: 2   Allergies  Allergen Reactions   Cyclobenzaprine  Nausea Only     Review of Systems  Constitutional: Negative.   Eyes: Negative.  Negative for blurred vision.   Respiratory:  Negative for shortness of breath.   Cardiovascular:  Negative for chest pain and palpitations.  Endocrine: Negative for polydipsia, polyphagia and polyuria.  Musculoskeletal: Negative.   Skin: Negative.   Psychiatric/Behavioral: Negative.       Today's Vitals   02/20/24 1439  BP: 120/68  Pulse: 89  Temp: 98.5 F (36.9 C)  TempSrc: Oral  Weight: 174 lb (78.9 kg)  Height: 5' 5 (1.651 m)  PainSc: 0-No pain   Body mass index is 28.96 kg/m.  Wt Readings from Last 3 Encounters:  02/20/24 174 lb (78.9 kg)  10/17/23 164 lb 3.2 oz (74.5 kg)  07/19/23 165 lb (74.8 kg)    The 10-year ASCVD risk score (Arnett DK, et al., 2019) is: 24.8%   Values used to calculate the score:     Age: 77 years     Clincally relevant sex: Female     Is Non-Hispanic African American:  Yes     Diabetic: Yes     Tobacco smoker: No     Systolic Blood Pressure: 120 mmHg     Is BP treated: Yes     HDL Cholesterol: 60 mg/dL     Total Cholesterol: 160 mg/dL  Objective:  Physical Exam Vitals and nursing note reviewed.  Constitutional:      Appearance: Normal appearance.  HENT:     Head: Normocephalic and atraumatic.  Eyes:     Extraocular Movements: Extraocular movements intact.  Cardiovascular:     Rate and Rhythm: Normal rate and regular rhythm.     Heart sounds: Normal heart sounds.  Pulmonary:     Effort: Pulmonary effort is normal.     Breath sounds: Normal breath sounds.  Musculoskeletal:     Cervical back: Normal range of motion.  Skin:    General: Skin is warm.  Neurological:     General: No focal deficit present.     Mental Status: She is alert.  Psychiatric:        Mood and Affect: Mood normal.        Behavior: Behavior normal.         Assessment And Plan:  Type 2 diabetes mellitus with hyperlipidemia (HCC) Assessment & Plan: Chronic, LDL goal is less than 70.   She will c/w pravastatin  80mg  daily.  She will continue with metformin  XR 750mg  and Farxiga  10mg   daily for her diabetes. She is encouraged to avoid refined carbs. She will f/u in 3-4 months for re-evaluation. - Order lancets and test strips for blood glucose monitoring. - Schedule follow-up diabetes check in January 2026 (physical exam)   Orders: -     CBC -     CMP14+EGFR -     Hemoglobin A1c -     Lipid panel  Hypertensive heart disease without heart failure Assessment & Plan: Essential hypertension, managed with losartan  and hydrochlorothiazide  50/12.5 mg. - Continue losartan  and hydrochlorothiazide  50/12.5 mg.  Orders: -     CBC -     CMP14+EGFR -     Lipid panel  Atherosclerosis of aorta (HCC) Assessment & Plan: Chronic, LDL goal< 70.  She will c/w pravastatin  and encouraged to follow heart healthy lifestyle.    Primary hypothyroidism Assessment & Plan: Currently on Synthroid  50 mcg daily, but she admits she sometimes skips a day.  Thyroid  function tests to assess control. - Order thyroid  function tests. - Importance of medication compliance was stressed to the patient.    Orders: -     TSH  Hepatic steatosis Assessment & Plan: Chronic, encouraged to increase daily activity and decrease sugar intake.    Malignant neoplasm of nipple of right breast in female, unspecified estrogen receptor status (HCC) Assessment & Plan: She is s/p right lumpectomy and adjuvant radiation.  She is currently followed by Oncology, most recent note reviewed. She is currently taking anastrozole  1mg  daily.    She has moved to [place] and may have a new provider before Jan 2025, she is still thinking about whether or not she wants to transfer care. For now, CPE scheduled for Jan 2026.  Return if symptoms worsen or fail to improve.  Patient was given opportunity to ask questions. Patient verbalized understanding of the plan and was able to repeat key elements of the plan. All questions were answered to their satisfaction.   I, Catheryn LOISE Slocumb, MD, have reviewed all documentation  for this visit. The documentation on 02/20/24 for the exam, diagnosis,  procedures, and orders are all accurate and complete.   IF YOU HAVE BEEN REFERRED TO A SPECIALIST, IT MAY TAKE 1-2 WEEKS TO SCHEDULE/PROCESS THE REFERRAL. IF YOU HAVE NOT HEARD FROM US /SPECIALIST IN TWO WEEKS, PLEASE GIVE US  A CALL AT [phone].

## 2024-02-20 NOTE — Assessment & Plan Note (Signed)
Chronic, LDL goal< 70.  She will c/w pravastatin and encouraged to follow heart healthy lifestyle.

## 2024-02-20 NOTE — Assessment & Plan Note (Signed)
 She is s/p right lumpectomy and adjuvant radiation.  She is currently followed by Oncology, most recent note reviewed. She is currently taking anastrozole 1mg  daily.

## 2024-02-20 NOTE — Patient Instructions (Signed)

## 2024-02-20 NOTE — Assessment & Plan Note (Signed)
 Chronic, encouraged to increase daily activity and decrease sugar intake.

## 2024-02-20 NOTE — Assessment & Plan Note (Signed)
 Currently on Synthroid  50 mcg daily, but she admits she sometimes skips a day.  Thyroid  function tests to assess control. - Order thyroid  function tests. - Importance of medication compliance was stressed to the patient.

## 2024-02-21 ENCOUNTER — Ambulatory Visit: Payer: Self-pay | Admitting: Internal Medicine

## 2024-02-21 DIAGNOSIS — E039 Hypothyroidism, unspecified: Secondary | ICD-10-CM | POA: Diagnosis not present

## 2024-02-21 DIAGNOSIS — R194 Change in bowel habit: Secondary | ICD-10-CM | POA: Diagnosis not present

## 2024-02-21 DIAGNOSIS — E669 Obesity, unspecified: Secondary | ICD-10-CM | POA: Diagnosis not present

## 2024-02-21 DIAGNOSIS — K219 Gastro-esophageal reflux disease without esophagitis: Secondary | ICD-10-CM | POA: Diagnosis not present

## 2024-02-21 DIAGNOSIS — I10 Essential (primary) hypertension: Secondary | ICD-10-CM | POA: Diagnosis not present

## 2024-02-21 DIAGNOSIS — K760 Fatty (change of) liver, not elsewhere classified: Secondary | ICD-10-CM | POA: Diagnosis not present

## 2024-02-21 DIAGNOSIS — Z1211 Encounter for screening for malignant neoplasm of colon: Secondary | ICD-10-CM | POA: Diagnosis not present

## 2024-02-21 DIAGNOSIS — E782 Mixed hyperlipidemia: Secondary | ICD-10-CM | POA: Diagnosis not present

## 2024-02-21 LAB — LIPID PANEL
Chol/HDL Ratio: 3.1 ratio (ref 0.0–4.4)
Cholesterol, Total: 176 mg/dL (ref 100–199)
HDL: 56 mg/dL (ref >39)
LDL Chol Calc (NIH): 97 mg/dL (ref 0–99)
Triglycerides: 131 mg/dL (ref 0–149)
VLDL Cholesterol Cal: 23 mg/dL (ref 5–40)

## 2024-02-21 LAB — CMP14+EGFR
ALT: 17 IU/L (ref 0–32)
AST: 21 IU/L (ref 0–40)
Albumin: 4.4 g/dL (ref 3.8–4.8)
Alkaline Phosphatase: 105 IU/L (ref 44–121)
BUN/Creatinine Ratio: 19 (ref 12–28)
BUN: 13 mg/dL (ref 8–27)
Bilirubin Total: 0.7 mg/dL (ref 0.0–1.2)
CO2: 21 mmol/L (ref 20–29)
Calcium: 9.8 mg/dL (ref 8.7–10.3)
Chloride: 100 mmol/L (ref 96–106)
Creatinine, Ser: 0.67 mg/dL (ref 0.57–1.00)
Globulin, Total: 2.7 g/dL (ref 1.5–4.5)
Glucose: 79 mg/dL (ref 70–99)
Potassium: 3.7 mmol/L (ref 3.5–5.2)
Sodium: 139 mmol/L (ref 134–144)
Total Protein: 7.1 g/dL (ref 6.0–8.5)
eGFR: 92 mL/min/1.73 (ref >59)

## 2024-02-21 LAB — CBC
Hematocrit: 42.3 % (ref 34.0–46.6)
Hemoglobin: 13.8 g/dL (ref 11.1–15.9)
MCH: 27.9 pg (ref 26.6–33.0)
MCHC: 32.6 g/dL (ref 31.5–35.7)
MCV: 86 fL (ref 79–97)
Platelets: 280 x10E3/uL (ref 150–450)
RBC: 4.95 x10E6/uL (ref 3.77–5.28)
RDW: 13.1 % (ref 11.7–15.4)
WBC: 5.3 x10E3/uL (ref 3.4–10.8)

## 2024-02-21 LAB — HEMOGLOBIN A1C
Est. average glucose Bld gHb Est-mCnc: 134 mg/dL
Hgb A1c MFr Bld: 6.3 % — ABNORMAL HIGH (ref 4.8–5.6)

## 2024-02-21 LAB — TSH: TSH: 2.42 u[IU]/mL (ref 0.450–4.500)

## 2024-03-08 ENCOUNTER — Other Ambulatory Visit: Payer: Self-pay | Admitting: Internal Medicine

## 2024-03-14 ENCOUNTER — Other Ambulatory Visit: Payer: Self-pay | Admitting: Internal Medicine

## 2024-03-14 DIAGNOSIS — E1165 Type 2 diabetes mellitus with hyperglycemia: Secondary | ICD-10-CM

## 2024-03-17 ENCOUNTER — Other Ambulatory Visit: Payer: Self-pay | Admitting: Internal Medicine

## 2024-04-06 DIAGNOSIS — K635 Polyp of colon: Secondary | ICD-10-CM | POA: Diagnosis not present

## 2024-04-06 DIAGNOSIS — K222 Esophageal obstruction: Secondary | ICD-10-CM | POA: Diagnosis not present

## 2024-04-06 DIAGNOSIS — R1310 Dysphagia, unspecified: Secondary | ICD-10-CM | POA: Diagnosis not present

## 2024-04-06 DIAGNOSIS — K219 Gastro-esophageal reflux disease without esophagitis: Secondary | ICD-10-CM | POA: Diagnosis not present

## 2024-04-06 DIAGNOSIS — K6289 Other specified diseases of anus and rectum: Secondary | ICD-10-CM | POA: Diagnosis not present

## 2024-04-06 DIAGNOSIS — D123 Benign neoplasm of transverse colon: Secondary | ICD-10-CM | POA: Diagnosis not present

## 2024-04-06 DIAGNOSIS — R194 Change in bowel habit: Secondary | ICD-10-CM | POA: Diagnosis not present

## 2024-04-06 DIAGNOSIS — K5730 Diverticulosis of large intestine without perforation or abscess without bleeding: Secondary | ICD-10-CM | POA: Diagnosis not present

## 2024-04-06 DIAGNOSIS — Z1211 Encounter for screening for malignant neoplasm of colon: Secondary | ICD-10-CM | POA: Diagnosis not present

## 2024-04-06 LAB — HM COLONOSCOPY

## 2024-04-13 DIAGNOSIS — M1990 Unspecified osteoarthritis, unspecified site: Secondary | ICD-10-CM | POA: Diagnosis not present

## 2024-04-13 DIAGNOSIS — Z8249 Family history of ischemic heart disease and other diseases of the circulatory system: Secondary | ICD-10-CM | POA: Diagnosis not present

## 2024-04-13 DIAGNOSIS — E785 Hyperlipidemia, unspecified: Secondary | ICD-10-CM | POA: Diagnosis not present

## 2024-04-13 DIAGNOSIS — Z833 Family history of diabetes mellitus: Secondary | ICD-10-CM | POA: Diagnosis not present

## 2024-04-13 DIAGNOSIS — K219 Gastro-esophageal reflux disease without esophagitis: Secondary | ICD-10-CM | POA: Diagnosis not present

## 2024-04-13 DIAGNOSIS — E119 Type 2 diabetes mellitus without complications: Secondary | ICD-10-CM | POA: Diagnosis not present

## 2024-04-13 DIAGNOSIS — H409 Unspecified glaucoma: Secondary | ICD-10-CM | POA: Diagnosis not present

## 2024-04-13 DIAGNOSIS — I10 Essential (primary) hypertension: Secondary | ICD-10-CM | POA: Diagnosis not present

## 2024-04-13 DIAGNOSIS — C50919 Malignant neoplasm of unspecified site of unspecified female breast: Secondary | ICD-10-CM | POA: Diagnosis not present

## 2024-04-25 ENCOUNTER — Ambulatory Visit: Payer: Medicare PPO

## 2024-04-25 ENCOUNTER — Ambulatory Visit

## 2024-04-25 VITALS — BP 118/70 | HR 96 | Temp 98.7°F | Ht 65.0 in | Wt 177.2 lb

## 2024-04-25 DIAGNOSIS — Z23 Encounter for immunization: Secondary | ICD-10-CM

## 2024-04-25 DIAGNOSIS — Z0000 Encounter for general adult medical examination without abnormal findings: Secondary | ICD-10-CM

## 2024-04-25 NOTE — Patient Instructions (Addendum)
 [name],  Thank you for taking the time for your Medicare Wellness Visit. I appreciate your continued commitment to your health goals. Please review the care plan we discussed, and feel free to reach out if I can assist you further.  Medicare recommends these wellness visits once per year to help you and your care team stay ahead of potential health issues. These visits are designed to focus on prevention, allowing your provider to concentrate on managing your acute and chronic conditions during your regular appointments.  Please note that Annual Wellness Visits do not include a physical exam. Some assessments may be limited, especially if the visit was conducted virtually. If needed, we may recommend a separate in-person follow-up with your provider.  Ongoing Care Seeing your primary care provider every 3 to 6 months helps us  monitor your health and provide consistent, personalized care.   Referrals If a referral was made during today's visit and you haven't received any updates within two weeks, please contact the referred provider directly to check on the status.  Recommended Screenings:  Health Maintenance  Topic Date Due   COVID-19 Vaccine (7 - 2025-26 season) 02/27/2024   Eye exam for diabetics  05/10/2024   Yearly kidney health urinalysis for diabetes  06/15/2024   Complete foot exam   07/18/2024   Hemoglobin A1C  08/22/2024   Breast Cancer Screening  01/12/2025   Yearly kidney function blood test for diabetes  02/19/2025   Medicare Annual Wellness Visit  04/25/2025   DTaP/Tdap/Td vaccine (3 - Td or Tdap) 03/23/2032   Colon Cancer Screening  04/06/2034   Pneumococcal Vaccine for age over 77  Completed   Flu Shot  Completed   DEXA scan (bone density measurement)  Completed   Hepatitis C Screening  Completed   Zoster (Shingles) Vaccine  Completed   Meningitis B Vaccine  Aged Out  ]    04/25/2024   12:03 PM  Advanced Directives  Does Patient Have a Medical Advance  Directive? No  Would patient like information on creating a medical advance directive? No - Patient declined   Advance Care Planning is important because it: Ensures you receive medical care that aligns with your values, goals, and preferences. Provides guidance to your family and loved ones, reducing the emotional burden of decision-making during critical moments.  Vision: Annual vision screenings are recommended for early detection of glaucoma, cataracts, and diabetic retinopathy. These exams can also reveal signs of chronic conditions such as diabetes and high blood pressure.  Dental: Annual dental screenings help detect early signs of oral cancer, gum disease, and other conditions linked to overall health, including heart disease and diabetes.  Please see the attached documents for additional preventive care recommendations.

## 2024-04-25 NOTE — Progress Notes (Signed)
 Subjective:   Christina Hernandez is a 73 y.o. who presents for a Medicare Wellness preventive visit.  As a reminder, Annual Wellness Visits don't include a physical exam, and some assessments may be limited, especially if this visit is performed virtually. We may recommend an in-person follow-up visit with your provider if needed.  Visit Complete: In person    Persons Participating in Visit: Patient.  AWV Questionnaire: No: Patient Medicare AWV questionnaire was not completed prior to this visit.  Cardiac Risk Factors include: advanced age (>80men, >72 women);diabetes mellitus;hypertension     Objective:    Today's Vitals   04/25/24 1155 04/25/24 1157  BP: 118/70   Pulse: 96   Temp: 98.7 F (37.1 C)   TempSrc: Oral   SpO2: 97%   Weight: 177 lb 3.2 oz (80.4 kg)   Height: 5' 5 (1.651 m)   PainSc:  5    Body mass index is 29.49 kg/m.     04/25/2024   12:03 PM 04/13/2023   12:05 PM 11/09/2022    5:21 AM 04/07/2022    2:22 PM 03/12/2021   10:57 AM 07/11/2020   11:17 AM 03/12/2020   11:38 AM  Advanced Directives  Does Patient Have a Medical Advance Directive? No No No No No No No  Would patient like information on creating a medical advance directive? No - Patient declined Yes (MAU/Ambulatory/Procedural Areas - Information given)  No - Patient declined No - Patient declined  Yes (MAU/Ambulatory/Procedural Areas - Information given)    Current Medications (verified) Outpatient Encounter Medications as of 04/25/2024  Medication Sig   anastrozole  (ARIMIDEX ) 1 MG tablet TAKE 1 TABLET BY MOUTH EVERY DAY   baclofen  (LIORESAL ) 10 MG tablet TAKE 1 TABLET BY MOUTH THREE TIMES A DAY AS NEEDED FOR MUSCLE SPASM   cetirizine  (ZYRTEC ) 10 MG tablet TAKE 1 TABLET BY MOUTH EVERY DAY   Cholecalciferol  (VITAMIN D ) 125 MCG (5000 UT) CAPS Take 5,000 Units by mouth daily.   COMBIGAN  0.2-0.5 % ophthalmic solution Place 1 drop into both eyes 2 (two) times daily.   DULoxetine  (CYMBALTA )  60 MG capsule Take 60 mg by mouth.   FARXIGA  10 MG TABS tablet TAKE 1 TABLET BY MOUTH DAILY BEFORE BREAKFAST.   gabapentin  (NEURONTIN ) 300 MG capsule Take 900 mg by mouth.   latanoprost  (XALATAN ) 0.005 % ophthalmic solution Place 1 drop into both eyes at bedtime.   levothyroxine  (SYNTHROID ) 50 MCG tablet TAKE 1 TABLET BY MOUTH MONDAY THROUGH FRIDAY ONLY   losartan -hydrochlorothiazide  (HYZAAR) 50-12.5 MG tablet TAKE 1 TABLET BY MOUTH EVERY DAY   metFORMIN  (GLUCOPHAGE -XR) 750 MG 24 hr tablet TAKE 1 TABLET BY MOUTH EVERY DAY   pravastatin  (PRAVACHOL ) 80 MG tablet ONE TAB DAILY po   glucose blood (CONTOUR NEXT TEST) test strip Use daily to check blood sugars. (Patient not taking: Reported on 04/25/2024)   No facility-administered encounter medications on file as of 04/25/2024.    Allergies (verified) Cyclobenzaprine    History: Past Medical History:  Diagnosis Date   Arthritis    Breast cancer, right breast (HCC)    pending right breast lumpectomy on September 01, 2018   Diabetes mellitus type II, controlled (HCC)    On Kombiglyze   Family history of ovarian cancer    Family history of thyroid  cancer    GERD (gastroesophageal reflux disease)    rarely occurs- no meds   Hyperlipidemia associated with type 2 diabetes mellitus (HCC)    Hypertension    Hypothyroidism  Personal history of radiation therapy    Primary localized osteoarthritis of right knee 04/17/2019   S/P knee replacement 04/17/2019   Trigeminal neuralgia of left side of face 2015   s/p Gamma knife   Past Surgical History:  Procedure Laterality Date   BREAST BIOPSY Right 07/24/2018   BREAST LUMPECTOMY Right 09/01/2018   BREAST LUMPECTOMY WITH RADIOACTIVE SEED LOCALIZATION Right 09/01/2018   Procedure: RIGHT BREAST LUMPECTOMY WITH RADIOACTIVE SEED LOCALIZATION;  Surgeon: Ethyl Lenis, MD;  Location: [facility];  Service: General;  Laterality: Right;   COLONOSCOPY     Gamma Knife Left 04/08/2016    HYSTEROSCOPY WITH D & C  05/24/2011   Procedure: DILATATION AND CURETTAGE (D&C) /HYSTEROSCOPY;  Surgeon: Hargis Paradise, MD;  Location: [facility];  Service: Gynecology;  Laterality: N/A;   KNEE SURGERY Right 06/10/15   torn ligament   PARTIAL KNEE ARTHROPLASTY Right 04/17/2019   Procedure: UNICOMPARTMENTAL KNEE;  Surgeon: Josefina Chew, MD;  Location: [facility];  Service: Orthopedics;  Laterality: Right;   THYROIDECTOMY N/A 09/08/2016   Procedure: TOTAL THYROIDECTOMY;  Surgeon: Daniel Moccasin, MD;  Location: [facility];  Service: ENT;  Laterality: N/A;   TUBAL LIGATION     uterine polyp     WISDOM TOOTH EXTRACTION Left 2014   Family History  Problem Relation Age of Onset   Hypertension Mother    Kidney failure Mother    Congestive Heart Failure Mother    Heart Problems Father    Stroke Father    Pneumonia Father    Heart Problems Sister 31       heart transplant   Stroke Paternal Grandmother    Dementia Paternal Grandmother    Stroke Paternal Grandfather    Cardiomyopathy Sister    Ovarian cancer Sister 60       d. 81   Thyroid  disease Sister    Thyroid  disease Sister    Thyroid  cancer Niece 49   Breast cancer Neg Hx    Social History   Socioeconomic History   Marital status: Widowed    Spouse name: Todd   Number of children: 4   Years of education: BS   Highest education level: Not on file  Occupational History   Occupation: Retired    Associate Professor: [employer]  Tobacco Use   Smoking status: Never   Smokeless tobacco: Never  Vaping Use   Vaping status: Never Used  Substance and Sexual Activity   Alcohol use: No   Drug use: No   Sexual activity: Not Currently  Other Topics Concern   Not on file  Social History Narrative   Patient lives at home with family.   Caffeine Use: occasionally      Lives at home with husband and son.  Is a retired runner, broadcasting/film/video   Social Drivers of Corporate Investment Banker Strain: Low Risk  (04/25/2024)   Overall Financial Resource Strain (CARDIA)     Difficulty of Paying Living Expenses: Not hard at all  Food Insecurity: No Food Insecurity (04/25/2024)   Hunger Vital Sign    Worried About Running Out of Food in the Last Year: Never true    Ran Out of Food in the Last Year: Never true  Transportation Needs: No Transportation Needs (04/25/2024)   PRAPARE - Administrator, Civil Service (Medical): No    Lack of Transportation (Non-Medical): No  Physical Activity: Insufficiently Active (04/25/2024)   Exercise Vital Sign    Days of Exercise per Week: 3 days  Minutes of Exercise per Session: 30 min  Stress: No Stress Concern Present (04/25/2024)   [employer] of Occupational Health - Occupational Stress Questionnaire    Feeling of Stress: Not at all  Social Connections: Socially Isolated (04/25/2024)   Social Connection and Isolation Panel    Frequency of Communication with Friends and Family: More than three times a week    Frequency of Social Gatherings with Friends and Family: More than three times a week    Attends Religious Services: Never    Database Administrator or Organizations: No    Attends Banker Meetings: Never    Marital Status: Widowed    Tobacco Counseling Counseling given: Not Answered    Clinical Intake:  Pre-visit preparation completed: Yes  Pain : 0-10 Pain Score: 5  Pain Type: Chronic pain Pain Location: Face Pain Descriptors / Indicators: Aching Pain Onset: More than a month ago Pain Frequency: Constant     Nutritional Status: BMI 25 -29 Overweight Nutritional Risks: None Diabetes: Yes CBG done?: No Did pt. bring in CBG monitor from home?: No  Lab Results  Component Value Date   HGBA1C 6.3 (H) 02/20/2024   HGBA1C 6.4 (H) 10/17/2023   HGBA1C 6.4 (H) 06/16/2023     How often do you need to have someone help you when you read instructions, pamphlets, or other written materials from your doctor or pharmacy?: 1 - Never  Interpreter Needed?: No  Information  entered by :: NAllen LPN   Activities of Daily Living     04/25/2024   11:57 AM  In your present state of health, do you have any difficulty performing the following activities:  Hearing? 1  Comment sometimes tv loud  Vision? 1  Difficulty concentrating or making decisions? 0  Walking or climbing stairs? 0  Dressing or bathing? 0  Doing errands, shopping? 0  Preparing Food and eating ? N  Using the Toilet? N  In the past six months, have you accidently leaked urine? N  Do you have problems with loss of bowel control? N  Managing your Medications? N  Managing your Finances? N  Housekeeping or managing your Housekeeping? N    Patient Care Team: Jarold Medici, MD as PCP - General (Internal Medicine) Anner Alm ORN, MD as PCP - Cardiology (Cardiology) Timmie Norris, MD as Consulting Physician (Obstetrics and Gynecology) Ethyl Alm, MD as Consulting Physician (General Surgery) Dewey Rush, MD as Consulting Physician (Radiation Oncology) Margaret Eduard SAUNDERS, MD as Consulting Physician (Neurology) Mannam, Praveen, MD as Consulting Physician (Pulmonary Disease) Josefina Chew, MD as Consulting Physician (Orthopedic Surgery) Mindi Mt, MD (Inactive) as Consulting Physician (Anesthesiology) Cyrus Carwin, MD as Consulting Physician (Ophthalmology)  I have updated your Care Teams any recent Medical Services you may have received from other providers in the past year.     Assessment:   This is a routine wellness examination for Lyrick.  Hearing/Vision screen Hearing Screening - Comments:: Denies true hearing issues Vision Screening - Comments:: Regular eye exams, Dr. Cyrus   Goals Addressed             This Visit's Progress    Patient Stated       04/25/2024, wants to work on diet, eat healthier and lose weight       Depression Screen     04/25/2024   12:04 PM 02/20/2024    2:41 PM 10/17/2023    2:12 PM 07/19/2023    2:19 PM 04/13/2023   12:08 PM  02/09/2023   10:26 AM 09/27/2022    3:12 PM  PHQ 2/9 Scores  PHQ - 2 Score 0 0 0 0 0 0 0  PHQ- 9 Score 6 0 0 0 6 0     Fall Risk     04/25/2024   12:03 PM 02/20/2024    2:41 PM 10/17/2023    2:12 PM 04/13/2023   12:07 PM 02/09/2023   10:26 AM  Fall Risk   Falls in the past year? 0 0 0 0 0  Number falls in past yr: 0 0 0 0 0  Injury with Fall? 0 0 0 0 0  Risk for fall due to : Medication side effect No Fall Risks No Fall Risks Medication side effect No Fall Risks  Follow up Falls evaluation completed;Falls prevention discussed Falls evaluation completed Falls evaluation completed Falls prevention discussed;Falls evaluation completed Falls evaluation completed    MEDICARE RISK AT HOME:  Medicare Risk at Home Any stairs in or around the home?: Yes If so, are there any without handrails?: No Home free of loose throw rugs in walkways, pet beds, electrical cords, etc?: Yes Adequate lighting in your home to reduce risk of falls?: Yes Life alert?: No Use of a cane, walker or w/c?: No Grab bars in the bathroom?: No Shower chair or bench in shower?: Yes Elevated toilet seat or a handicapped toilet?: No  TIMED UP AND GO:  Was the test performed?  Yes  Length of time to ambulate 10 feet: 5 sec Gait steady and fast without use of assistive device  Cognitive Function: 6CIT completed        04/25/2024   12:06 PM 04/13/2023   12:12 PM 04/07/2022    2:29 PM 03/12/2021   11:00 AM 03/12/2020   11:41 AM  6CIT Screen  What Year? 0 points 0 points 0 points 0 points 0 points  What month? 0 points 0 points 0 points 0 points 0 points  What time? 0 points 0 points 0 points 0 points 0 points  Count back from 20 0 points 0 points 4 points 0 points 0 points  Months in reverse 0 points 0 points 0 points 0 points 0 points  Repeat phrase 0 points 2 points 2 points 2 points 0 points  Total Score 0 points 2 points 6 points 2 points 0 points    Immunizations Immunization History  Administered  Date(s) Administered   Fluad Quad(high Dose 65+) 02/27/2019, 03/12/2020, 03/12/2021, 03/23/2022   INFLUENZA, HIGH DOSE SEASONAL PF 03/27/2018, 02/27/2019, 02/23/2023   Influenza,inj,Quad PF,6+ Mos 04/12/2016   Moderna Covid-19 Fall Seasonal Vaccine 73yrs & older 02/23/2023   Moderna Sars-Covid-2 Vaccination 08/06/2019, 09/03/2019, 03/31/2020   PFIZER Comirnaty(Gray Top)Covid-19 Tri-Sucrose Vaccine 10/24/2020   Pfizer Covid-19 Vaccine Bivalent Booster 44yrs & up 06/13/2021   Pneumococcal Conjugate-13 03/17/2020   Pneumococcal Polysaccharide-23 05/25/2016   Tdap 05/28/2011, 03/23/2022   Zoster Recombinant(Shingrix ) 02/22/2022, 09/27/2022    Screening Tests Health Maintenance  Topic Date Due   Influenza Vaccine  01/27/2024   COVID-19 Vaccine (7 - 2025-26 season) 02/27/2024   OPHTHALMOLOGY EXAM  05/10/2024   Diabetic kidney evaluation - Urine ACR  06/15/2024   FOOT EXAM  07/18/2024   HEMOGLOBIN A1C  08/22/2024   Mammogram  01/12/2025   Diabetic kidney evaluation - eGFR measurement  02/19/2025   Medicare Annual Wellness (AWV)  04/25/2025   DTaP/Tdap/Td (3 - Td or Tdap) 03/23/2032   Colonoscopy  04/06/2034   Pneumococcal Vaccine: 50+ Years  Completed  DEXA SCAN  Completed   Hepatitis C Screening  Completed   Zoster Vaccines- Shingrix   Completed   Meningococcal B Vaccine  Aged Out    Health Maintenance Items Addressed: Vaccines Given today: flu  Additional Screening:  Vision Screening: Recommended annual ophthalmology exams for early detection of glaucoma and other disorders of the eye. Is the patient up to date with their annual eye exam?  Yes  Who is the provider or what is the name of the office in which the patient attends annual eye exams? Dr. Cyrus  Dental Screening: Recommended annual dental exams for proper oral hygiene  Community Resource Referral / Chronic Care Management: CRR required this visit?  No   CCM required this visit?  No   Plan:    I have  personally reviewed and noted the following in the patient's chart:   Medical and social history Use of alcohol, tobacco or illicit drugs  Current medications and supplements including opioid prescriptions. Patient is not currently taking opioid prescriptions. Functional ability and status Nutritional status Physical activity Advanced directives List of other physicians Hospitalizations, surgeries, and ER visits in previous 12 months Vitals Screenings to include cognitive, depression, and falls Referrals and appointments  In addition, I have reviewed and discussed with patient certain preventive protocols, quality metrics, and best practice recommendations. A written personalized care plan for preventive services as well as general preventive health recommendations were provided to patient.   Ardella FORBES Dawn, LPN   [id number]   After Visit Summary: (In Person-Printed) AVS printed and given to the patient  Notes: Nothing significant to report at this time.

## 2024-05-06 DIAGNOSIS — E1169 Type 2 diabetes mellitus with other specified complication: Secondary | ICD-10-CM | POA: Diagnosis not present

## 2024-05-06 DIAGNOSIS — J320 Chronic maxillary sinusitis: Secondary | ICD-10-CM | POA: Diagnosis not present

## 2024-05-06 DIAGNOSIS — K0510 Chronic gingivitis, plaque induced: Secondary | ICD-10-CM | POA: Diagnosis not present

## 2024-05-07 DIAGNOSIS — K0499 Other diseases of pulp and periapical tissues: Secondary | ICD-10-CM | POA: Diagnosis not present

## 2024-05-07 DIAGNOSIS — L03211 Cellulitis of face: Secondary | ICD-10-CM | POA: Diagnosis not present

## 2024-05-07 DIAGNOSIS — K047 Periapical abscess without sinus: Secondary | ICD-10-CM | POA: Diagnosis not present

## 2024-05-15 DIAGNOSIS — H401133 Primary open-angle glaucoma, bilateral, severe stage: Secondary | ICD-10-CM | POA: Diagnosis not present

## 2024-05-15 DIAGNOSIS — H02403 Unspecified ptosis of bilateral eyelids: Secondary | ICD-10-CM | POA: Diagnosis not present

## 2024-05-15 DIAGNOSIS — E119 Type 2 diabetes mellitus without complications: Secondary | ICD-10-CM | POA: Diagnosis not present

## 2024-05-15 DIAGNOSIS — G500 Trigeminal neuralgia: Secondary | ICD-10-CM | POA: Diagnosis not present

## 2024-05-16 DIAGNOSIS — M2759 Other periradicular pathology associated with previous endodontic treatment: Secondary | ICD-10-CM | POA: Diagnosis not present

## 2024-05-16 DIAGNOSIS — R220 Localized swelling, mass and lump, head: Secondary | ICD-10-CM | POA: Diagnosis not present

## 2024-05-16 DIAGNOSIS — K0889 Other specified disorders of teeth and supporting structures: Secondary | ICD-10-CM | POA: Diagnosis not present

## 2024-05-31 ENCOUNTER — Other Ambulatory Visit: Payer: Self-pay

## 2024-05-31 MED ORDER — ANASTROZOLE 1 MG PO TABS: 1.0000 mg | ORAL_TABLET | Freq: Every day | ORAL | 2 refills | Status: AC

## 2024-06-17 ENCOUNTER — Other Ambulatory Visit: Payer: Self-pay | Admitting: Internal Medicine

## 2024-07-19 ENCOUNTER — Encounter: Payer: Self-pay | Admitting: Internal Medicine

## 2024-07-21 ENCOUNTER — Other Ambulatory Visit: Payer: Self-pay | Admitting: Internal Medicine

## 2024-07-21 DIAGNOSIS — E1169 Type 2 diabetes mellitus with other specified complication: Secondary | ICD-10-CM

## 2024-07-31 ENCOUNTER — Ambulatory Visit

## 2024-08-01 ENCOUNTER — Ambulatory Visit: Payer: Self-pay | Admitting: Internal Medicine

## 2024-08-01 ENCOUNTER — Encounter: Payer: Self-pay | Admitting: Internal Medicine

## 2024-08-01 VITALS — BP 118/70 | Temp 98.3°F | Ht 65.0 in | Wt 180.6 lb

## 2024-08-01 DIAGNOSIS — E6609 Other obesity due to excess calories: Secondary | ICD-10-CM

## 2024-08-01 DIAGNOSIS — I700 Atherosclerosis of aorta: Secondary | ICD-10-CM

## 2024-08-01 DIAGNOSIS — Z853 Personal history of malignant neoplasm of breast: Secondary | ICD-10-CM

## 2024-08-01 DIAGNOSIS — I119 Hypertensive heart disease without heart failure: Secondary | ICD-10-CM

## 2024-08-01 DIAGNOSIS — K146 Glossodynia: Secondary | ICD-10-CM

## 2024-08-01 DIAGNOSIS — Z0000 Encounter for general adult medical examination without abnormal findings: Secondary | ICD-10-CM

## 2024-08-01 DIAGNOSIS — H6123 Impacted cerumen, bilateral: Secondary | ICD-10-CM

## 2024-08-01 DIAGNOSIS — G500 Trigeminal neuralgia: Secondary | ICD-10-CM

## 2024-08-01 DIAGNOSIS — K148 Other diseases of tongue: Secondary | ICD-10-CM

## 2024-08-01 DIAGNOSIS — E1169 Type 2 diabetes mellitus with other specified complication: Secondary | ICD-10-CM

## 2024-08-01 DIAGNOSIS — E039 Hypothyroidism, unspecified: Secondary | ICD-10-CM

## 2024-08-01 LAB — POCT URINALYSIS DIPSTICK
Bilirubin, UA: NEGATIVE
Blood, UA: NEGATIVE
Glucose, UA: POSITIVE — AB
Ketones, UA: NEGATIVE
Leukocytes, UA: NEGATIVE
Nitrite, UA: NEGATIVE
Protein, UA: NEGATIVE
Spec Grav, UA: 1.015
Urobilinogen, UA: 0.2 U/dL
pH, UA: 5.5

## 2024-08-01 NOTE — Assessment & Plan Note (Addendum)
 Chronic, LDL goal< 70.  She will c/w pravastatin  and encouraged to follow heart healthy lifestyle.  Orders:   TSH + free T4

## 2024-08-01 NOTE — Patient Instructions (Signed)
 Health Maintenance, Female Adopting a healthy lifestyle and getting preventive care are important in promoting health and wellness. Ask your health care provider about: The right schedule for you to have regular tests and exams. Things you can do on your own to prevent diseases and keep yourself healthy. What should I know about diet, weight, and exercise? Eat a healthy diet  Eat a diet that includes plenty of vegetables, fruits, low-fat dairy products, and lean protein. Do not eat a lot of foods that are high in solid fats, added sugars, or sodium. Maintain a healthy weight Body mass index (BMI) is used to identify weight problems. It estimates body fat based on height and weight. Your health care provider can help determine your BMI and help you achieve or maintain a healthy weight. Get regular exercise Get regular exercise. This is one of the most important things you can do for your health. Most adults should: Exercise for at least 150 minutes each week. The exercise should increase your heart rate and make you sweat (moderate-intensity exercise). Do strengthening exercises at least twice a week. This is in addition to the moderate-intensity exercise. Spend less time sitting. Even light physical activity can be beneficial. Watch cholesterol and blood lipids Have your blood tested for lipids and cholesterol at 76 years of age, then have this test every 5 years. Have your cholesterol levels checked more often if: Your lipid or cholesterol levels are high. You are older than 75 years of age. You are at high risk for heart disease. What should I know about cancer screening? Depending on your health history and family history, you may need to have cancer screening at various ages. This may include screening for: Breast cancer. Cervical cancer. Colorectal cancer. Skin cancer. Lung cancer. What should I know about heart disease, diabetes, and high blood pressure? Blood pressure and heart  disease High blood pressure causes heart disease and increases the risk of stroke. This is more likely to develop in people who have high blood pressure readings or are overweight. Have your blood pressure checked: Every 3-5 years if you are 32-37 years of age. Every year if you are 71 years old or older. Diabetes Have regular diabetes screenings. This checks your fasting blood sugar level. Have the screening done: Once every three years after age 24 if you are at a normal weight and have a low risk for diabetes. More often and at a younger age if you are overweight or have a high risk for diabetes. What should I know about preventing infection? Hepatitis B If you have a higher risk for hepatitis B, you should be screened for this virus. Talk with your health care provider to find out if you are at risk for hepatitis B infection. Hepatitis C Testing is recommended for: Everyone born from [birth year] through 1967. Anyone with known risk factors for hepatitis C. Sexually transmitted infections (STIs) Get screened for STIs, including gonorrhea and chlamydia, if: You are sexually active and are younger than 76 years of age. You are older than 76 years of age and your health care provider tells you that you are at risk for this type of infection. Your sexual activity has changed since you were last screened, and you are at increased risk for chlamydia or gonorrhea. Ask your health care provider if you are at risk. Ask your health care provider about whether you are at high risk for HIV. Your health care provider may recommend a prescription medicine to help prevent HIV  infection. If you choose to take medicine to prevent HIV, you should first get tested for HIV. You should then be tested every 3 months for as long as you are taking the medicine. Pregnancy If you are about to stop having your period (premenopausal) and you may become pregnant, seek counseling before you get pregnant. Take 400 to 800  micrograms (mcg) of folic acid every day if you become pregnant. Ask for birth control (contraception) if you want to prevent pregnancy. Osteoporosis and menopause Osteoporosis is a disease in which the bones lose minerals and strength with aging. This can result in bone fractures. If you are 42 years old or older, or if you are at risk for osteoporosis and fractures, ask your health care provider if you should: Be screened for bone loss. Take a calcium  or vitamin D  supplement to lower your risk of fractures. Be given hormone replacement therapy (HRT) to treat symptoms of menopause. Follow these instructions at home: Alcohol use Do not drink alcohol if: Your health care provider tells you not to drink. You are pregnant, may be pregnant, or are planning to become pregnant. If you drink alcohol: Limit how much you have to: 0-1 drink a day. Know how much alcohol is in your drink. In the [place] one drink equals one 12 oz bottle of beer (355 mL), one 5 oz glass of wine (148 mL), or one 1 oz glass of hard liquor (44 mL). Lifestyle Do not use any products that contain nicotine or tobacco. These products include cigarettes, chewing tobacco, and vaping devices, such as e-cigarettes. If you need help quitting, ask your health care provider. Do not use street drugs. Do not share needles. Ask your health care provider for help if you need support or information about quitting drugs. General instructions Schedule regular health, dental, and eye exams. Stay current with your vaccines. Tell your health care provider if: You often feel depressed. You have ever been abused or do not feel safe at home. This information is not intended to replace advice given to you by your health care provider. Make sure you discuss any questions you have with your health care provider. Document Revised: 12/21/2023 Document Reviewed: 11/03/2020 Elsevier Patient Education  2025 [employer].

## 2024-08-01 NOTE — Assessment & Plan Note (Addendum)

## 2024-08-01 NOTE — Assessment & Plan Note (Addendum)
 Christina Hernandez

## 2024-08-01 NOTE — Assessment & Plan Note (Addendum)
 Chronic, diabetic foot exam was performed.  Diabetes managed with Farxiga  and metformin . Discussed Ozempic for weight loss and diabetes management. She is willing to learn self-injection technique. - Educated on Ozempic self-injection technique. - Consider Ozempic for weight loss and diabetes management. - She was advised that Ozempic also offers renal/cardiac protection.  Orders:   POCT Urinalysis Dipstick (81002)   Microalbumin / Creatinine Urine Ratio   EKG 12-Lead   CBC   CMP14+EGFR   Lipid panel   Hemoglobin A1c

## 2024-08-01 NOTE — Progress Notes (Unsigned)
 I,Christina Hernandez, CMA,acting as a neurosurgeon for Christina LOISE Slocumb, MD.,have documented all relevant documentation on the behalf of Christina LOISE Slocumb, MD,as directed by  Christina LOISE Slocumb, MD while in the presence of Christina LOISE Slocumb, MD.  Subjective:    Patient ID: Christina Hernandez , female    DOB: 1951/03/22 , 74 y.o.   MRN: [id number]  Chief Complaint  Patient presents with   Annual Exam    She is here today for a full physical examination. She is followed by [facility] for her GYN exams. She reports compliance with meds. She denies having headaches, chest pain and shortness of breath. She has no specific concerns or complaints at this time.    Diabetes   Hypertension   Hypothyroidism    HPI Discussed the use of AI scribe software for clinical note transcription with the patient, who gave verbal consent to proceed.  History of Present Illness Christina Hernandez is a 74 year old female who presents for a physical and diabetes check.  She has experienced weight gain, currently weighing 180 pounds, which she attributes to a lack of physical activity, especially during the winter months. She is interested in weight loss medications and discussed Ozempic as a possible option.  Her diabetes management includes Farxiga  and metformin . She also takes losartan , levothyroxine , and pravastatin . She has a history of trigeminal neuralgia and is on duloxetine , gabapentin , and amitriptyline. She reports she has not had real sharp pains recently.  She has a history of breast cancer and has been on anastrozole  since November 2020, with an expected completion in November 2025. She reports that her recent mammogram in [place] was good.  She reports a painful lump on her tongue, which she frequently bites. She has consulted a dentist and an ENT in the past but has not undergone a biopsy.  She mentions a spot on her lung and has an upcoming appointment with pulmonary care in [place]. She missed a  previous appointment due to being unable to leave her driveway.  She has had recent eye exams and is planning a laser procedure for her cataracts. She has seen an eye doctor in [place] and also visited [facility] for glasses.  She plans to visit her children and sisters during her trips to [place] and [place]. No issues with her bowels, which are regular with the help of gummy vitamins. Her ears are 'terrible'.   Diabetes She presents for her follow-up diabetic visit. She has type 2 diabetes mellitus. There are no hypoglycemic associated symptoms. Pertinent negatives for diabetes include no blurred vision. There are no hypoglycemic complications. Risk factors for coronary artery disease include diabetes mellitus, dyslipidemia, hypertension, sedentary lifestyle and post-menopausal. She participates in exercise intermittently. An ACE inhibitor/angiotensin II receptor blocker is being taken. Eye exam is current.  Hypertension This is a chronic problem. The current episode started more than 1 year ago. The problem has been gradually improving since onset. The problem is controlled. Pertinent negatives include no blurred vision. Compliance problems include exercise.      Past Medical History:  Diagnosis Date   Arthritis    Breast cancer, right breast [facility])    pending right breast lumpectomy on September 01, 2018   Diabetes mellitus type II, controlled (HCC)    On Kombiglyze   Family history of ovarian cancer    Family history of thyroid  cancer    GERD (gastroesophageal reflux disease)    rarely occurs- no meds   Hyperlipidemia associated  with type 2 diabetes mellitus (HCC)    Hypertension    Hypothyroidism    Personal history of radiation therapy    Primary localized osteoarthritis of right knee 04/17/2019   S/P knee replacement 04/17/2019   Trigeminal neuralgia of left side of face 2015   s/p Gamma knife     Family History  Problem Relation Age of Onset   Hypertension  Mother    Kidney failure Mother    Congestive Heart Failure Mother    Heart Problems Father    Stroke Father    Pneumonia Father    Heart Problems Sister 58       heart transplant   Stroke Paternal Grandmother    Dementia Paternal Grandmother    Stroke Paternal Grandfather    Cardiomyopathy Sister    Ovarian cancer Sister 60       d. 75   Thyroid  disease Sister    Thyroid  disease Sister    Thyroid  cancer Niece 78   Breast cancer Neg Hx      Current Outpatient Medications:    anastrozole  (ARIMIDEX ) 1 MG tablet, Take 1 tablet (1 mg total) by mouth daily., Disp: 90 tablet, Rfl: 2   baclofen  (LIORESAL ) 10 MG tablet, TAKE 1 TABLET BY MOUTH THREE TIMES A DAY AS NEEDED FOR MUSCLE SPASM, Disp: 270 tablet, Rfl: 0   cetirizine  (ZYRTEC ) 10 MG tablet, TAKE 1 TABLET BY MOUTH EVERY DAY, Disp: 90 tablet, Rfl: 0   Cholecalciferol  (VITAMIN D ) 125 MCG (5000 UT) CAPS, Take 5,000 Units by mouth daily., Disp: , Rfl:    COMBIGAN  0.2-0.5 % ophthalmic solution, Place 1 drop into both eyes 2 (two) times daily., Disp: , Rfl:    DULoxetine  (CYMBALTA ) 60 MG capsule, Take 60 mg by mouth., Disp: , Rfl:    FARXIGA  10 MG TABS tablet, TAKE 1 TABLET BY MOUTH DAILY BEFORE BREAKFAST., Disp: 90 tablet, Rfl: 1   gabapentin  (NEURONTIN ) 300 MG capsule, Take 900 mg by mouth., Disp: , Rfl:    latanoprost  (XALATAN ) 0.005 % ophthalmic solution, Place 1 drop into both eyes at bedtime., Disp: , Rfl:    levothyroxine  (SYNTHROID ) 50 MCG tablet, TAKE 1 TABLET BY MOUTH MONDAY THROUGH FRIDAY ONLY, Disp: 60 tablet, Rfl: 2   losartan -hydrochlorothiazide  (HYZAAR) 50-12.5 MG tablet, TAKE 1 TABLET BY MOUTH EVERY DAY, Disp: 90 tablet, Rfl: 1   metFORMIN  (GLUCOPHAGE -XR) 750 MG 24 hr tablet, TAKE 1 TABLET BY MOUTH EVERY DAY, Disp: 90 tablet, Rfl: 1   pravastatin  (PRAVACHOL ) 80 MG tablet, TAKE 1 TABLET BY MOUTH EVERY DAY, Disp: 78 tablet, Rfl: 2   glucose blood (CONTOUR NEXT TEST) test strip, Use daily to check blood sugars. (Patient not  taking: Reported on 08/01/2024), Disp: 100 each, Rfl: 2   Allergies  Allergen Reactions   Cyclobenzaprine  Nausea Only      The patient states she uses post menopausal status for birth control. No LMP recorded. Patient is postmenopausal.. Negative for Dysmenorrhea. Negative for: breast discharge, breast lump(s), breast pain and breast self exam. Associated symptoms include abnormal vaginal bleeding. Pertinent negatives include abnormal bleeding (hematology), anxiety, decreased libido, depression, difficulty falling sleep, dyspareunia, history of infertility, nocturia, sexual dysfunction, sleep disturbances, urinary incontinence, urinary urgency, vaginal discharge and vaginal itching. Diet regular.The patient states her exercise level is    . The patient's tobacco use is: Tobacco Use History[1]. She has been exposed to passive smoke. The patient's alcohol use is:  Social History   Substance and Sexual Activity  Alcohol Use No  Review of Systems  Constitutional: Negative.   HENT: Negative.    Eyes: Negative.  Negative for blurred vision.  Respiratory: Negative.    Cardiovascular: Negative.   Gastrointestinal: Negative.   Endocrine: Negative.   Genitourinary: Negative.   Musculoskeletal: Negative.   Skin: Negative.   Allergic/Immunologic: Negative.   Neurological: Negative.   Hematological: Negative.   Psychiatric/Behavioral: Negative.       Today's Vitals   08/01/24 1458  BP: 118/70  Temp: 98.3 F (36.8 C)  SpO2: 98%  Weight: 180 lb 9.6 oz (81.9 kg)  Height: 5' 5 (1.651 m)   Body mass index is 30.05 kg/m.  Wt Readings from Last 3 Encounters:  08/01/24 180 lb 9.6 oz (81.9 kg)  04/25/24 177 lb 3.2 oz (80.4 kg)  02/20/24 174 lb (78.9 kg)     Objective:  Physical Exam Vitals and nursing note reviewed.  Constitutional:      Appearance: Normal appearance.  HENT:     Head: Normocephalic and atraumatic.     Comments: Flat, slightly white lesion left lateral tongue No  vesicular lesions noted.     Right Ear: Tympanic membrane, ear canal and external ear normal.     Left Ear: Tympanic membrane, ear canal and external ear normal.     Nose: Nose normal.     Mouth/Throat:     Mouth: Mucous membranes are moist.     Pharynx: Oropharynx is clear.  Eyes:     Extraocular Movements: Extraocular movements intact.     Conjunctiva/sclera: Conjunctivae normal.     Pupils: Pupils are equal, round, and reactive to light.  Cardiovascular:     Rate and Rhythm: Normal rate and regular rhythm.     Pulses: Normal pulses.          Dorsalis pedis pulses are 2+ on the right side and 2+ on the left side.     Heart sounds: Normal heart sounds.  Pulmonary:     Effort: Pulmonary effort is normal.     Breath sounds: Normal breath sounds.  Chest:  Breasts:    Tanner Score is 5.     Left: Normal.     Comments: Healed scar on right breast Abdominal:     General: Abdomen is flat. Bowel sounds are normal.     Palpations: Abdomen is soft.  Genitourinary:    Comments: deferred Musculoskeletal:        General: Normal range of motion.     Cervical back: Normal range of motion and neck supple.  Feet:     Right foot:     Protective Sensation: 5 sites tested.  5 sites sensed.     Skin integrity: Dry skin present.     Toenail Condition: Right toenails are long.     Left foot:     Protective Sensation: 5 sites tested.  5 sites sensed.     Skin integrity: Dry skin present.     Toenail Condition: Left toenails are long.  Skin:    General: Skin is warm and dry.  Neurological:     General: No focal deficit present.     Mental Status: She is alert and oriented to person, place, and time.  Psychiatric:        Mood and Affect: Mood normal.        Behavior: Behavior normal.         Assessment And Plan:     Assessment & Plan Encounter for general adult medical examination w/o abnormal findings A  full exam was performed.  Importance of monthly self breast exams was discussed  with the patient.  She is advised to get 30-45 minutes of regular exercise, no less than four to five days per week. Both weight-bearing and aerobic exercises are recommended.  She is advised to follow a healthy diet with at least six fruits/veggies per day, decrease intake of red meat and other saturated fats and to increase fish intake to twice weekly.  Meats/fish should not be fried -- baked, boiled or broiled is preferable. It is also important to cut back on your sugar intake.  Be sure to read labels - try to avoid anything with added sugar, high fructose corn syrup or other sweeteners.  If you must use a sweetener, you can try stevia or monkfruit.  It is also important to avoid artificially sweetened foods/beverages and diet drinks. Lastly, wear SPF 50 sunscreen on exposed skin and when in direct sunlight for an extended period of time.  Be sure to avoid fast food restaurants and aim for at least 60 ounces of water daily.        Type 2 diabetes mellitus with hyperlipidemia (HCC) Chronic, diabetic foot exam was performed.  Diabetes managed with Farxiga  and metformin . Discussed Ozempic for weight loss and diabetes management. She is willing to learn self-injection technique. - Educated on Ozempic self-injection technique. - Consider Ozempic for weight loss and diabetes management. - She was advised that Ozempic also offers renal/cardiac protection.  Orders:   POCT Urinalysis Dipstick (81002)   Microalbumin / Creatinine Urine Ratio   EKG 12-Lead   CBC   CMP14+EGFR   Lipid panel   Hemoglobin A1c  Hypertensive heart disease without heart failure Chronic, well controlled. EKG performed, ST w/ nonspecific ST depression, nonspecific T abnormality.  Managed with losartan /hydrochlorothiazide  50/12.5mg  daily.  - Follow low sodium diet.  - Continue with current meds.  - Follow up in four months for re-evaluation.     Primary hypothyroidism Currently on Synthroid  50 mcg daily, but she admits she  sometimes skips a day.  Thyroid  function tests to assess control. - Order thyroid  function tests. - Importance of medication compliance was stressed to the patient.       Atherosclerosis of aorta Chronic, LDL goal< 70.  She will c/w pravastatin  and encouraged to follow heart healthy lifestyle.  Orders:   TSH + free T4   Trigeminal neuralgia of left side of face Chronic, followed by [facility]. This is currently managed with duloxetine , gabapentin , and amitriptyline. - Continue duloxetine , gabapentin , and amitriptyline.    Glossodynia Her sx are likely due to underlying trigeminal neuralgia. I will refer her to ENT as requested. Orders:   Vitamin B12   Ambulatory referral to ENT  History of breast cancer The five year course of anastrozole  ended in November 2025.  Unfortunately, she was not seen by Oncology last year. I did reach out to Dr. Loretha during her visit, she is able to stop this medication.   - Contacted oncologist to confirm need for continued anastrozole  therapy. - She is encouraged to schedule an appt with Oncology in the near future.     Lesion of tongue Persistent painful lesion, previous ENT evaluation unsatisfactory. Biting may contribute to pain. - Referred to ENT for evaluation of tongue lesion. Orders:   Ambulatory referral to ENT  Class 1 obesity due to excess calories with serious comorbidity and body mass index (BMI) of 30.0 to 30.9 in adult Her BMI is acceptable for her  demographic.  - She is encouraged to incorporate more exercise into her daily routine.  - Aim for at least 150 minutes of exercise per week.     Bilateral impacted cerumen After obtaining verbal consent, both ears were flushed by irrigation. No TM abnormalities were noted. She tolerated procedure well without any complications.    Orders:   Ear Lavage  General health maintenance Discussed importance of regular breast and eye exams. Recent mammogram and eye exam completed.  Ongoing cataract monitoring discussed. - Continue regular breast exams. - Continue follow-up with eye doctor for cataract management. x Return in 6 weeks (on 09/12/2024), or f/u ozempic, for 1 YEAR HM, 4 MONTH DM F/U.SABRA Patient was given opportunity to ask questions. Patient verbalized understanding of the plan and was able to repeat key elements of the plan. All questions were answered to their satisfaction.   I, Christina LOISE Slocumb, MD, have reviewed all documentation for this visit. The documentation on 08/01/24 for the exam, diagnosis, procedures, and orders are all accurate and complete.       [1]  Social History Tobacco Use  Smoking Status Never  Smokeless Tobacco Never

## 2024-08-02 DIAGNOSIS — E6609 Other obesity due to excess calories: Secondary | ICD-10-CM | POA: Insufficient documentation

## 2024-08-02 DIAGNOSIS — K148 Other diseases of tongue: Secondary | ICD-10-CM | POA: Insufficient documentation

## 2024-08-02 LAB — CMP14+EGFR
ALT: 18 [IU]/L (ref 0–32)
AST: 22 [IU]/L (ref 0–40)
Albumin: 4.6 g/dL (ref 3.8–4.8)
Alkaline Phosphatase: 102 [IU]/L (ref 49–135)
BUN/Creatinine Ratio: 19 (ref 12–28)
BUN: 15 mg/dL (ref 8–27)
Bilirubin Total: 0.8 mg/dL (ref 0.0–1.2)
CO2: 21 mmol/L (ref 20–29)
Calcium: 10.1 mg/dL (ref 8.7–10.3)
Chloride: 99 mmol/L (ref 96–106)
Creatinine, Ser: 0.79 mg/dL (ref 0.57–1.00)
Globulin, Total: 2.5 g/dL (ref 1.5–4.5)
Glucose: 89 mg/dL (ref 70–99)
Potassium: 4.1 mmol/L (ref 3.5–5.2)
Sodium: 141 mmol/L (ref 134–144)
Total Protein: 7.1 g/dL (ref 6.0–8.5)
eGFR: 78 mL/min/{1.73_m2}

## 2024-08-02 LAB — LIPID PANEL
Chol/HDL Ratio: 3.2 ratio (ref 0.0–4.4)
Cholesterol, Total: 168 mg/dL (ref 100–199)
HDL: 53 mg/dL
LDL Chol Calc (NIH): 91 mg/dL (ref 0–99)
Triglycerides: 137 mg/dL (ref 0–149)
VLDL Cholesterol Cal: 24 mg/dL (ref 5–40)

## 2024-08-02 LAB — MICROALBUMIN / CREATININE URINE RATIO
Creatinine, Urine: 55.4 mg/dL
Microalb/Creat Ratio: 7 mg/g{creat} (ref 0–29)
Microalbumin, Urine: 4.1 ug/mL

## 2024-08-02 LAB — CBC
Hematocrit: 41.2 % (ref 34.0–46.6)
Hemoglobin: 13.5 g/dL (ref 11.1–15.9)
MCH: 28 pg (ref 26.6–33.0)
MCHC: 32.8 g/dL (ref 31.5–35.7)
MCV: 86 fL (ref 79–97)
Platelets: 286 10*3/uL (ref 150–450)
RBC: 4.82 x10E6/uL (ref 3.77–5.28)
RDW: 13 % (ref 11.7–15.4)
WBC: 5.9 10*3/uL (ref 3.4–10.8)

## 2024-08-02 LAB — HEMOGLOBIN A1C
Est. average glucose Bld gHb Est-mCnc: 137 mg/dL
Hgb A1c MFr Bld: 6.4 % — ABNORMAL HIGH (ref 4.8–5.6)

## 2024-08-02 LAB — TSH+FREE T4
Free T4: 1.01 ng/dL (ref 0.82–1.77)
TSH: 1.59 u[IU]/mL (ref 0.450–4.500)

## 2024-08-02 LAB — VITAMIN B12: Vitamin B-12: 911 pg/mL (ref 232–1245)

## 2024-08-02 NOTE — Assessment & Plan Note (Signed)
 After obtaining verbal consent, both ears were flushed by irrigation. No TM abnormalities were noted. She tolerated procedure well without any complications.    Orders:   Ear Lavage

## 2024-08-02 NOTE — Assessment & Plan Note (Signed)
 Chronic, followed by [facility]. This is currently managed with duloxetine , gabapentin , and amitriptyline. - Continue duloxetine , gabapentin , and amitriptyline.

## 2024-08-02 NOTE — Assessment & Plan Note (Signed)
 Her BMI is acceptable for her demographic.  - She is encouraged to incorporate more exercise into her daily routine.  - Aim for at least 150 minutes of exercise per week.

## 2024-08-02 NOTE — Assessment & Plan Note (Signed)
 Persistent painful lesion, previous ENT evaluation unsatisfactory. Biting may contribute to pain. - Referred to ENT for evaluation of tongue lesion. Orders:   Ambulatory referral to ENT

## 2024-08-02 NOTE — Assessment & Plan Note (Signed)
 Her sx are likely due to underlying trigeminal neuralgia. I will refer her to ENT as requested. Orders:   Vitamin B12   Ambulatory referral to ENT

## 2024-08-03 ENCOUNTER — Telehealth: Payer: Self-pay | Admitting: Hematology and Oncology

## 2024-08-03 NOTE — Telephone Encounter (Signed)
 I spoke with patient and she is aware of scheduled MD appointment on 08/22/2024 per MD staff message.

## 2024-08-22 ENCOUNTER — Inpatient Hospital Stay: Admitting: Hematology and Oncology

## 2024-08-23 ENCOUNTER — Ambulatory Visit

## 2024-10-02 ENCOUNTER — Ambulatory Visit: Payer: Self-pay | Admitting: Internal Medicine

## 2024-12-13 ENCOUNTER — Ambulatory Visit: Payer: Self-pay | Admitting: Internal Medicine

## 2025-05-22 ENCOUNTER — Ambulatory Visit: Payer: Self-pay

## 2025-08-05 ENCOUNTER — Encounter: Payer: Self-pay | Admitting: Internal Medicine
